# Patient Record
Sex: Female | Born: 1945 | Race: White | Hispanic: No | Marital: Married | Smoking: Never smoker
Health system: Southern US, Community
[De-identification: ages and names within clinical notes are randomized; demographics above are authoritative.]

## PROBLEM LIST (undated history)

## (undated) DIAGNOSIS — K219 Gastro-esophageal reflux disease without esophagitis: Secondary | ICD-10-CM

## (undated) DIAGNOSIS — D649 Anemia, unspecified: Secondary | ICD-10-CM

## (undated) DIAGNOSIS — R1310 Dysphagia, unspecified: Secondary | ICD-10-CM

## (undated) DIAGNOSIS — I10 Essential (primary) hypertension: Secondary | ICD-10-CM

## (undated) DIAGNOSIS — G8929 Other chronic pain: Secondary | ICD-10-CM

## (undated) DIAGNOSIS — I890 Lymphedema, not elsewhere classified: Secondary | ICD-10-CM

## (undated) DIAGNOSIS — E2839 Other primary ovarian failure: Secondary | ICD-10-CM

## (undated) DIAGNOSIS — N1832 Chronic kidney disease, stage 3b: Secondary | ICD-10-CM

## (undated) DIAGNOSIS — E039 Hypothyroidism, unspecified: Secondary | ICD-10-CM

## (undated) DIAGNOSIS — D509 Iron deficiency anemia, unspecified: Secondary | ICD-10-CM

## (undated) DIAGNOSIS — M797 Fibromyalgia: Secondary | ICD-10-CM

## (undated) DIAGNOSIS — M2550 Pain in unspecified joint: Secondary | ICD-10-CM

## (undated) DIAGNOSIS — M1990 Unspecified osteoarthritis, unspecified site: Secondary | ICD-10-CM

## (undated) DIAGNOSIS — G2581 Restless legs syndrome: Secondary | ICD-10-CM

## (undated) DIAGNOSIS — R1084 Generalized abdominal pain: Secondary | ICD-10-CM

## (undated) DIAGNOSIS — S02402A Zygomatic fracture, unspecified, initial encounter for closed fracture: Secondary | ICD-10-CM

## (undated) DIAGNOSIS — R1313 Dysphagia, pharyngeal phase: Secondary | ICD-10-CM

## (undated) DIAGNOSIS — L719 Rosacea, unspecified: Secondary | ICD-10-CM

## (undated) DIAGNOSIS — Z8719 Personal history of other diseases of the digestive system: Secondary | ICD-10-CM

## (undated) DIAGNOSIS — E538 Deficiency of other specified B group vitamins: Secondary | ICD-10-CM

## (undated) DIAGNOSIS — R413 Other amnesia: Secondary | ICD-10-CM

## (undated) DIAGNOSIS — R5382 Chronic fatigue, unspecified: Secondary | ICD-10-CM

## (undated) DIAGNOSIS — R296 Repeated falls: Secondary | ICD-10-CM

## (undated) DIAGNOSIS — F419 Anxiety disorder, unspecified: Secondary | ICD-10-CM

## (undated) DIAGNOSIS — Z9889 Other specified postprocedural states: Secondary | ICD-10-CM

## (undated) DIAGNOSIS — F329 Major depressive disorder, single episode, unspecified: Secondary | ICD-10-CM

## (undated) DIAGNOSIS — G5710 Meralgia paresthetica, unspecified lower limb: Secondary | ICD-10-CM

## (undated) DIAGNOSIS — E7800 Pure hypercholesterolemia, unspecified: Secondary | ICD-10-CM

## (undated) DIAGNOSIS — R269 Unspecified abnormalities of gait and mobility: Secondary | ICD-10-CM

## (undated) DIAGNOSIS — G9332 Myalgic encephalomyelitis/chronic fatigue syndrome: Secondary | ICD-10-CM

## (undated) DIAGNOSIS — S52501A Unspecified fracture of the lower end of right radius, initial encounter for closed fracture: Secondary | ICD-10-CM

## (undated) DIAGNOSIS — H6060 Unspecified chronic otitis externa, unspecified ear: Secondary | ICD-10-CM

## (undated) DIAGNOSIS — K589 Irritable bowel syndrome without diarrhea: Secondary | ICD-10-CM

## (undated) DIAGNOSIS — J309 Allergic rhinitis, unspecified: Secondary | ICD-10-CM

## (undated) DIAGNOSIS — E0500 Thyrotoxicosis with diffuse goiter without thyrotoxic crisis or storm: Secondary | ICD-10-CM

## (undated) DIAGNOSIS — G43909 Migraine, unspecified, not intractable, without status migrainosus: Secondary | ICD-10-CM

## (undated) DIAGNOSIS — R600 Localized edema: Secondary | ICD-10-CM

## (undated) DIAGNOSIS — I639 Cerebral infarction, unspecified: Secondary | ICD-10-CM

## (undated) DIAGNOSIS — M545 Low back pain: Secondary | ICD-10-CM

## (undated) DIAGNOSIS — J189 Pneumonia, unspecified organism: Secondary | ICD-10-CM

## (undated) DIAGNOSIS — M3500 Sicca syndrome, unspecified: Secondary | ICD-10-CM

## (undated) HISTORY — DX: Unspecified fracture of the lower end of right radius, initial encounter for closed fracture: S52.501A

## (undated) HISTORY — DX: Chronic kidney disease, stage 3b: N18.32

## (undated) HISTORY — DX: Generalized abdominal pain: R10.84

## (undated) HISTORY — DX: Gastro-esophageal reflux disease without esophagitis: K21.9

## (undated) HISTORY — DX: Personal history of other diseases of the digestive system: Z87.19

## (undated) HISTORY — PX: ENDOMETRIAL ABLATION: SHX621

## (undated) HISTORY — PX: JOINT REPLACEMENT: SHX530

## (undated) HISTORY — DX: Dysphagia, pharyngeal phase: R13.13

## (undated) HISTORY — DX: Anemia, unspecified: D64.9

## (undated) HISTORY — DX: Unspecified osteoarthritis, unspecified site: M19.90

## (undated) HISTORY — PX: LAMINECTOMY: SHX219

## (undated) HISTORY — PX: AUGMENTATION MAMMAPLASTY: SUR837

## (undated) HISTORY — DX: Other primary ovarian failure: E28.39

## (undated) HISTORY — DX: Irritable bowel syndrome, unspecified: K58.9

## (undated) HISTORY — DX: Deficiency of other specified B group vitamins: E53.8

## (undated) HISTORY — DX: Allergic rhinitis, unspecified: J30.9

## (undated) HISTORY — PX: THYROIDECTOMY: SHX17

## (undated) HISTORY — DX: Lymphedema, not elsewhere classified: I89.0

## (undated) HISTORY — DX: Iron deficiency anemia, unspecified: D50.9

## (undated) HISTORY — DX: Rosacea, unspecified: L71.9

## (undated) HISTORY — DX: Essential (primary) hypertension: I10

## (undated) HISTORY — DX: Chronic fatigue, unspecified: R53.82

## (undated) HISTORY — DX: Thyrotoxicosis with diffuse goiter without thyrotoxic crisis or storm: E05.00

## (undated) HISTORY — DX: Migraine, unspecified, not intractable, without status migrainosus: G43.909

## (undated) HISTORY — DX: Pure hypercholesterolemia, unspecified: E78.00

## (undated) HISTORY — DX: Hypothyroidism, unspecified: E03.9

## (undated) HISTORY — DX: Major depressive disorder, single episode, unspecified: F32.9

## (undated) HISTORY — PX: ABDOMINAL HYSTERECTOMY: SHX81

## (undated) HISTORY — DX: Myalgic encephalomyelitis/chronic fatigue syndrome: G93.32

## (undated) HISTORY — DX: Unspecified abnormalities of gait and mobility: R26.9

## (undated) HISTORY — DX: Pain in unspecified joint: M25.50

## (undated) HISTORY — PX: GASTRIC BYPASS: SHX52

## (undated) HISTORY — PX: APPENDECTOMY: SHX54

## (undated) HISTORY — DX: Other specified postprocedural states: Z98.890

## (undated) HISTORY — DX: Unspecified chronic otitis externa, unspecified ear: H60.60

## (undated) HISTORY — DX: Meralgia paresthetica, unspecified lower limb: G57.10

## (undated) HISTORY — DX: Zygomatic fracture, unspecified side, initial encounter for closed fracture: S02.402A

## (undated) HISTORY — DX: Localized edema: R60.0

## (undated) HISTORY — PX: KNEE ARTHROPLASTY: SHX992

## (undated) HISTORY — DX: Other chronic pain: G89.29

## (undated) HISTORY — DX: Restless legs syndrome: G25.81

## (undated) HISTORY — DX: Fibromyalgia: M79.7

## (undated) HISTORY — DX: Cerebral infarction, unspecified: I63.9

## (undated) HISTORY — DX: Sicca syndrome, unspecified: M35.00

## (undated) HISTORY — DX: Anxiety disorder, unspecified: F41.9

## (undated) HISTORY — DX: Low back pain: M54.5

---
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
## 1998-03-20 ENCOUNTER — Other Ambulatory Visit: Admission: RE | Admit: 1998-03-20 | Discharge: 1998-03-20 | Payer: Self-pay | Admitting: Gynecology

## 1998-07-24 ENCOUNTER — Encounter: Admission: RE | Admit: 1998-07-24 | Discharge: 1998-09-03 | Payer: Self-pay | Admitting: Psychiatry

## 1999-07-02 ENCOUNTER — Other Ambulatory Visit: Admission: RE | Admit: 1999-07-02 | Discharge: 1999-07-02 | Payer: Self-pay | Admitting: Gynecology

## 2000-05-04 ENCOUNTER — Encounter: Admission: RE | Admit: 2000-05-04 | Discharge: 2000-08-02 | Payer: Self-pay | Admitting: Surgical Oncology

## 2000-08-31 ENCOUNTER — Other Ambulatory Visit: Admission: RE | Admit: 2000-08-31 | Discharge: 2000-08-31 | Payer: Self-pay | Admitting: Gynecology

## 2000-10-29 ENCOUNTER — Ambulatory Visit (HOSPITAL_COMMUNITY): Admission: RE | Admit: 2000-10-29 | Discharge: 2000-10-29 | Payer: Self-pay | Admitting: Sports Medicine

## 2000-10-29 ENCOUNTER — Encounter: Payer: Self-pay | Admitting: Sports Medicine

## 2001-02-17 ENCOUNTER — Ambulatory Visit (HOSPITAL_COMMUNITY): Admission: RE | Admit: 2001-02-17 | Discharge: 2001-02-17 | Payer: Self-pay | Admitting: Gastroenterology

## 2001-05-01 ENCOUNTER — Ambulatory Visit (HOSPITAL_COMMUNITY): Admission: RE | Admit: 2001-05-01 | Discharge: 2001-05-01 | Payer: Self-pay | Admitting: Family Medicine

## 2001-05-01 ENCOUNTER — Encounter: Payer: Self-pay | Admitting: Family Medicine

## 2001-05-09 ENCOUNTER — Encounter: Payer: Self-pay | Admitting: Neurosurgery

## 2001-05-09 ENCOUNTER — Ambulatory Visit (HOSPITAL_COMMUNITY): Admission: RE | Admit: 2001-05-09 | Discharge: 2001-05-09 | Payer: Self-pay | Admitting: Neurosurgery

## 2001-06-01 ENCOUNTER — Encounter: Payer: Self-pay | Admitting: Neurosurgery

## 2001-06-01 ENCOUNTER — Inpatient Hospital Stay (HOSPITAL_COMMUNITY): Admission: RE | Admit: 2001-06-01 | Discharge: 2001-06-03 | Payer: Self-pay | Admitting: Neurosurgery

## 2001-09-06 ENCOUNTER — Other Ambulatory Visit: Admission: RE | Admit: 2001-09-06 | Discharge: 2001-09-06 | Payer: Self-pay | Admitting: Gynecology

## 2001-11-08 ENCOUNTER — Ambulatory Visit (HOSPITAL_COMMUNITY): Admission: RE | Admit: 2001-11-08 | Discharge: 2001-11-08 | Payer: Self-pay | Admitting: Neurosurgery

## 2001-11-08 ENCOUNTER — Encounter: Payer: Self-pay | Admitting: Neurosurgery

## 2002-03-14 ENCOUNTER — Encounter: Payer: Self-pay | Admitting: Neurology

## 2002-03-14 ENCOUNTER — Ambulatory Visit (HOSPITAL_COMMUNITY): Admission: RE | Admit: 2002-03-14 | Discharge: 2002-03-14 | Payer: Self-pay | Admitting: Neurology

## 2002-09-26 ENCOUNTER — Other Ambulatory Visit: Admission: RE | Admit: 2002-09-26 | Discharge: 2002-09-26 | Payer: Self-pay | Admitting: Gynecology

## 2002-11-30 ENCOUNTER — Encounter: Payer: Self-pay | Admitting: Gastroenterology

## 2002-11-30 ENCOUNTER — Ambulatory Visit (HOSPITAL_COMMUNITY): Admission: RE | Admit: 2002-11-30 | Discharge: 2002-11-30 | Payer: Self-pay | Admitting: Gastroenterology

## 2003-11-27 ENCOUNTER — Other Ambulatory Visit: Admission: RE | Admit: 2003-11-27 | Discharge: 2003-11-27 | Payer: Self-pay | Admitting: Gynecology

## 2005-01-20 ENCOUNTER — Encounter: Admission: RE | Admit: 2005-01-20 | Discharge: 2005-01-20 | Payer: Self-pay | Admitting: Rheumatology

## 2005-01-23 ENCOUNTER — Encounter: Admission: RE | Admit: 2005-01-23 | Discharge: 2005-01-23 | Payer: Self-pay | Admitting: Rheumatology

## 2005-02-02 ENCOUNTER — Other Ambulatory Visit: Admission: RE | Admit: 2005-02-02 | Discharge: 2005-02-02 | Payer: Self-pay | Admitting: Gynecology

## 2005-02-17 ENCOUNTER — Ambulatory Visit: Payer: Self-pay | Admitting: Gastroenterology

## 2005-02-18 ENCOUNTER — Ambulatory Visit: Payer: Self-pay | Admitting: Gastroenterology

## 2005-02-27 ENCOUNTER — Ambulatory Visit: Payer: Self-pay | Admitting: Gastroenterology

## 2005-03-09 ENCOUNTER — Ambulatory Visit: Payer: Self-pay | Admitting: Gastroenterology

## 2006-11-16 ENCOUNTER — Other Ambulatory Visit: Admission: RE | Admit: 2006-11-16 | Discharge: 2006-11-16 | Payer: Self-pay | Admitting: Gynecology

## 2006-11-25 ENCOUNTER — Encounter: Admission: RE | Admit: 2006-11-25 | Discharge: 2006-11-25 | Payer: Self-pay | Admitting: Gynecology

## 2007-01-04 ENCOUNTER — Emergency Department (HOSPITAL_COMMUNITY): Admission: EM | Admit: 2007-01-04 | Discharge: 2007-01-04 | Payer: Self-pay | Admitting: Emergency Medicine

## 2007-03-14 ENCOUNTER — Encounter: Admission: RE | Admit: 2007-03-14 | Discharge: 2007-04-21 | Payer: Self-pay | Admitting: Neurology

## 2007-03-14 ENCOUNTER — Ambulatory Visit: Payer: Self-pay | Admitting: Psychology

## 2007-04-21 ENCOUNTER — Ambulatory Visit: Payer: Self-pay | Admitting: Psychology

## 2007-11-22 ENCOUNTER — Other Ambulatory Visit: Admission: RE | Admit: 2007-11-22 | Discharge: 2007-11-22 | Payer: Self-pay | Admitting: Gynecology

## 2008-01-26 ENCOUNTER — Ambulatory Visit (HOSPITAL_COMMUNITY): Admission: RE | Admit: 2008-01-26 | Discharge: 2008-01-26 | Payer: Self-pay | Admitting: Anesthesiology

## 2009-07-24 ENCOUNTER — Encounter: Admission: RE | Admit: 2009-07-24 | Discharge: 2009-07-24 | Payer: Self-pay | Admitting: Anesthesiology

## 2010-09-14 ENCOUNTER — Encounter: Payer: Self-pay | Admitting: Gynecology

## 2010-09-14 ENCOUNTER — Encounter: Payer: Self-pay | Admitting: Anesthesiology

## 2010-12-29 ENCOUNTER — Other Ambulatory Visit (HOSPITAL_COMMUNITY): Payer: Self-pay | Admitting: Orthopedic Surgery

## 2010-12-29 ENCOUNTER — Encounter (HOSPITAL_COMMUNITY)
Admission: RE | Admit: 2010-12-29 | Discharge: 2010-12-29 | Disposition: A | Discharge status: Home / Self Care | Payer: Medicare Other | Source: Ambulatory Visit | Attending: Orthopedic Surgery | Admitting: Orthopedic Surgery

## 2010-12-29 DIAGNOSIS — Z01811 Encounter for preprocedural respiratory examination: Secondary | ICD-10-CM

## 2010-12-29 DIAGNOSIS — Z01810 Encounter for preprocedural cardiovascular examination: Secondary | ICD-10-CM | POA: Insufficient documentation

## 2010-12-29 DIAGNOSIS — Z01818 Encounter for other preprocedural examination: Secondary | ICD-10-CM | POA: Insufficient documentation

## 2010-12-29 DIAGNOSIS — Z01812 Encounter for preprocedural laboratory examination: Secondary | ICD-10-CM | POA: Insufficient documentation

## 2010-12-29 LAB — DIFFERENTIAL
Basophils Absolute: 0.1 10*3/uL (ref 0.0–0.1)
Basophils Relative: 2 % — ABNORMAL HIGH (ref 0–1)
Eosinophils Absolute: 0.3 10*3/uL (ref 0.0–0.7)
Eosinophils Relative: 5 % (ref 0–5)
Lymphocytes Relative: 36 % (ref 12–46)
Lymphs Abs: 1.8 10*3/uL (ref 0.7–4.0)
Monocytes Absolute: 0.4 10*3/uL (ref 0.1–1.0)
Monocytes Relative: 7 % (ref 3–12)
Neutro Abs: 2.6 10*3/uL (ref 1.7–7.7)
Neutrophils Relative %: 50 % (ref 43–77)

## 2010-12-29 LAB — COMPREHENSIVE METABOLIC PANEL
ALT: 16 U/L (ref 0–35)
AST: 19 U/L (ref 0–37)
Albumin: 4 g/dL (ref 3.5–5.2)
Alkaline Phosphatase: 118 U/L — ABNORMAL HIGH (ref 39–117)
BUN: 12 mg/dL (ref 6–23)
CO2: 34 mEq/L — ABNORMAL HIGH (ref 19–32)
Calcium: 9.9 mg/dL (ref 8.4–10.5)
Chloride: 98 mEq/L (ref 96–112)
Creatinine, Ser: 1.06 mg/dL (ref 0.4–1.2)
GFR calc Af Amer: >60 mL/min (ref >60)
GFR calc non Af Amer: 52 mL/min — ABNORMAL LOW (ref >60)
Glucose, Bld: 91 mg/dL (ref 70–99)
Potassium: 4.5 mEq/L (ref 3.5–5.1)
Sodium: 138 mEq/L (ref 135–145)
Total Bilirubin: 0.2 mg/dL — ABNORMAL LOW (ref 0.3–1.2)
Total Protein: 6.3 g/dL (ref 6.0–8.3)

## 2010-12-29 LAB — CBC
HCT: 39.6 % (ref 36.0–46.0)
Hemoglobin: 13.1 g/dL (ref 12.0–15.0)
MCH: 29.8 pg (ref 26.0–34.0)
MCHC: 33.1 g/dL (ref 30.0–36.0)
MCV: 90 fL (ref 78.0–100.0)
Platelets: 184 10*3/uL (ref 150–400)
RBC: 4.4 MIL/uL (ref 3.87–5.11)
RDW: 12.5 % (ref 11.5–15.5)
WBC: 5.1 10*3/uL (ref 4.0–10.5)

## 2010-12-29 LAB — SURGICAL PCR SCREEN
MRSA, PCR: NEGATIVE
Staphylococcus aureus: NEGATIVE

## 2010-12-29 LAB — URINALYSIS, ROUTINE W REFLEX MICROSCOPIC
Bilirubin Urine: NEGATIVE
Glucose, UA: NEGATIVE mg/dL
Hgb urine dipstick: NEGATIVE
Ketones, ur: 15 mg/dL — AB
Nitrite: NEGATIVE
Protein, ur: NEGATIVE mg/dL
Specific Gravity, Urine: 1.02 (ref 1.005–1.030)
Urobilinogen, UA: 0.2 mg/dL (ref 0.0–1.0)
pH: 6 (ref 5.0–8.0)

## 2010-12-29 LAB — APTT: aPTT: 26 seconds (ref 24–37)

## 2010-12-29 LAB — ABO/RH: ABO/RH(D): A POS

## 2010-12-29 LAB — URINE MICROSCOPIC-ADD ON

## 2010-12-29 LAB — TYPE AND SCREEN
ABO/RH(D): A POS
Antibody Screen: NEGATIVE

## 2010-12-29 LAB — PROTIME-INR
INR: 0.9 (ref 0.00–1.49)
Prothrombin Time: 12.4 seconds (ref 11.6–15.2)

## 2010-12-30 LAB — URINE CULTURE
Colony Count: NO GROWTH
Culture  Setup Time: 201205071617
Culture: NO GROWTH

## 2011-01-09 NOTE — Op Note (Signed)
. [facility]  Patient:    Jillian Morris, Jillian Morris Visit Number: [id number] MRN: [id number]          Service Type: SUR Location: 3000 [id number] [id number] Attending Physician:  Barton Fanny Dictated by:   Hewitt Shorts, M.D. Proc. Date: 06/01/01 Admit Date:  06/01/2001                             Operative Report  PREOPERATIVE DIAGNOSIS:  Lumbar stenosis.  POSTOPERATIVE DIAGNOSIS:  Lumbar stenosis.  PROCEDURE:  L3 to L5 lumbar laminectomy.  SURGEON:  Hewitt Shorts, M.D.  ANESTHESIA:  General endotracheal.  INDICATION:  The patient is a 65 year old woman who presented with back and right lower extremity pain, who was found to have advanced degenerative joint disease and spondylosis with resulting multifactorial lumbar stenosis, worst at L4-5 but also significant at L3-4.  A decision was made to proceed with an L3 to L5 lumbar laminectomy for decompression of stenosis.  DESCRIPTION OF PROCEDURE:  Patient brought to the operating room and placed under general endotracheal anesthesia.  The patient was turned to a prone position.  the lumbar region was prepped with Betadine soap and solution and draped in a sterile fashion.  The midline was infiltrated with local anesthetic with epinephrine, an x-ray was taken to localize the L3-4 and L4-5 levels and a midline incision incision made and carried down through the subcutaneous tissue.  Bipolar cautery and electrocautery used to maintain hemostasis.  Dissection was carried down to the lumbar fascia, which was divided bilaterally, and the paraspinous muscles were dissected from the spinous processes and laminae in subperiosteal fashion.  Another x-ray was taken.  The L3, L4, and L5 spinous processes and laminae were identified.  WE then proceeded with a multilevel lumbar laminectomy utilizing double-action rongeurs, the [facility] Max drill, and Kerrison punches.  Care was taken to leave the underlying  thecal sac undisturbed.  There was found to be marked ligament overgrowth at L3-4, L4-5, and L5-S1.  This was carefully removed.  The foramina were carefully examined, and the foramina for the L3, L4, L5, and S1 nerve roots were decompressed bilaterally.  Once the decompression was complete and hemostasis was established, edges of the bone were waxed as necessary and a Gelfoam soaked in thrombin was placed in the laminectomy defect.  The paraspinal muscles were approximated with interrupted, undyed 1 Vicryl sutures, the deep fascia closed with interrupted, undyed 1 Vicryl sutures, and subcutaneous and subcuticular closure with interrupted, inverted 2-0 undyed Vicryl sutures and the skin edges were approximated with Dermabond. The patient tolerated the procedure well.  The estimated blood loss was 100 cc.  Sponge and needle count were correct.  Following surgery, the patient was turned back to a supine position, to be reversed from the anesthetic, extubated, and transferred to the recovery room for further care. Dictated by:   Hewitt Shorts, M.D. Attending Physician:  Barton Fanny DD:  06/01/01 TD:  06/01/01 Job: [id number]

## 2011-01-23 HISTORY — PX: REPLACEMENT TOTAL KNEE: SUR1224

## 2011-02-04 ENCOUNTER — Encounter (HOSPITAL_COMMUNITY)
Admission: RE | Admit: 2011-02-04 | Discharge: 2011-02-04 | Disposition: A | Discharge status: Home / Self Care | Payer: Medicare Other | Source: Ambulatory Visit | Attending: Orthopedic Surgery | Admitting: Orthopedic Surgery

## 2011-02-04 LAB — DIFFERENTIAL
Basophils Absolute: 0 10*3/uL (ref 0.0–0.1)
Basophils Relative: 1 % (ref 0–1)
Eosinophils Absolute: 0.3 10*3/uL (ref 0.0–0.7)
Eosinophils Relative: 6 % — ABNORMAL HIGH (ref 0–5)
Lymphocytes Relative: 39 % (ref 12–46)
Lymphs Abs: 1.9 10*3/uL (ref 0.7–4.0)
Monocytes Absolute: 0.4 10*3/uL (ref 0.1–1.0)
Monocytes Relative: 8 % (ref 3–12)
Neutro Abs: 2.3 10*3/uL (ref 1.7–7.7)
Neutrophils Relative %: 47 % (ref 43–77)

## 2011-02-04 LAB — CBC
HCT: 36.4 % (ref 36.0–46.0)
Hemoglobin: 12.1 g/dL (ref 12.0–15.0)
MCH: 30.2 pg (ref 26.0–34.0)
MCHC: 33.2 g/dL (ref 30.0–36.0)
MCV: 90.8 fL (ref 78.0–100.0)
Platelets: 153 10*3/uL (ref 150–400)
RBC: 4.01 MIL/uL (ref 3.87–5.11)
RDW: 12.6 % (ref 11.5–15.5)
WBC: 4.9 10*3/uL (ref 4.0–10.5)

## 2011-02-04 LAB — URINALYSIS, ROUTINE W REFLEX MICROSCOPIC
Glucose, UA: NEGATIVE mg/dL
Hgb urine dipstick: NEGATIVE
Ketones, ur: NEGATIVE mg/dL
Leukocytes, UA: NEGATIVE
Nitrite: NEGATIVE
Protein, ur: NEGATIVE mg/dL
Specific Gravity, Urine: 1.023 (ref 1.005–1.030)
Urobilinogen, UA: 0.2 mg/dL (ref 0.0–1.0)
pH: 5.5 (ref 5.0–8.0)

## 2011-02-04 LAB — COMPREHENSIVE METABOLIC PANEL
ALT: 12 U/L (ref 0–35)
AST: 14 U/L (ref 0–37)
Albumin: 3.7 g/dL (ref 3.5–5.2)
Alkaline Phosphatase: 103 U/L (ref 39–117)
BUN: 12 mg/dL (ref 6–23)
CO2: 31 mEq/L (ref 19–32)
Calcium: 9.1 mg/dL (ref 8.4–10.5)
Chloride: 104 mEq/L (ref 96–112)
Creatinine, Ser: 1.01 mg/dL (ref 0.4–1.2)
GFR calc Af Amer: >60 mL/min (ref >60)
GFR calc non Af Amer: 55 mL/min — ABNORMAL LOW (ref >60)
Glucose, Bld: 82 mg/dL (ref 70–99)
Potassium: 4.1 mEq/L (ref 3.5–5.1)
Sodium: 142 mEq/L (ref 135–145)
Total Bilirubin: 0.3 mg/dL (ref 0.3–1.2)
Total Protein: 5.9 g/dL — ABNORMAL LOW (ref 6.0–8.3)

## 2011-02-04 LAB — PROTIME-INR
INR: 0.89 (ref 0.00–1.49)
Prothrombin Time: 12.2 seconds (ref 11.6–15.2)

## 2011-02-04 LAB — SURGICAL PCR SCREEN
MRSA, PCR: NEGATIVE
Staphylococcus aureus: NEGATIVE

## 2011-02-04 LAB — APTT: aPTT: 23 seconds — ABNORMAL LOW (ref 24–37)

## 2011-02-05 LAB — URINE CULTURE
Colony Count: NO GROWTH
Culture  Setup Time: 201206131231
Culture: NO GROWTH

## 2011-02-16 ENCOUNTER — Inpatient Hospital Stay (HOSPITAL_COMMUNITY)
Admission: RE | Admit: 2011-02-16 | Discharge: 2011-02-23 | DRG: 470 | Disposition: A | Discharge status: Skilled Nursing Facility | Payer: Medicare Other | Source: Ambulatory Visit | Attending: Orthopedic Surgery | Admitting: Orthopedic Surgery

## 2011-02-16 ENCOUNTER — Inpatient Hospital Stay (HOSPITAL_COMMUNITY): Payer: Medicare Other

## 2011-02-16 DIAGNOSIS — I890 Lymphedema, not elsewhere classified: Secondary | ICD-10-CM | POA: Diagnosis present

## 2011-02-16 DIAGNOSIS — L719 Rosacea, unspecified: Secondary | ICD-10-CM | POA: Diagnosis present

## 2011-02-16 DIAGNOSIS — F3289 Other specified depressive episodes: Secondary | ICD-10-CM | POA: Diagnosis present

## 2011-02-16 DIAGNOSIS — M1710 Unilateral primary osteoarthritis, unspecified knee: Principal | ICD-10-CM | POA: Diagnosis present

## 2011-02-16 DIAGNOSIS — R5382 Chronic fatigue, unspecified: Secondary | ICD-10-CM | POA: Diagnosis present

## 2011-02-16 DIAGNOSIS — I10 Essential (primary) hypertension: Secondary | ICD-10-CM | POA: Diagnosis present

## 2011-02-16 DIAGNOSIS — M4800 Spinal stenosis, site unspecified: Secondary | ICD-10-CM | POA: Diagnosis present

## 2011-02-16 DIAGNOSIS — E039 Hypothyroidism, unspecified: Secondary | ICD-10-CM | POA: Diagnosis present

## 2011-02-16 DIAGNOSIS — F329 Major depressive disorder, single episode, unspecified: Secondary | ICD-10-CM | POA: Diagnosis present

## 2011-02-16 DIAGNOSIS — K589 Irritable bowel syndrome without diarrhea: Secondary | ICD-10-CM | POA: Diagnosis present

## 2011-02-16 DIAGNOSIS — Z9109 Other allergy status, other than to drugs and biological substances: Secondary | ICD-10-CM

## 2011-02-16 DIAGNOSIS — G9332 Myalgic encephalomyelitis/chronic fatigue syndrome: Secondary | ICD-10-CM | POA: Diagnosis present

## 2011-02-16 DIAGNOSIS — D62 Acute posthemorrhagic anemia: Secondary | ICD-10-CM | POA: Diagnosis not present

## 2011-02-16 DIAGNOSIS — E876 Hypokalemia: Secondary | ICD-10-CM | POA: Diagnosis not present

## 2011-02-16 DIAGNOSIS — Z9884 Bariatric surgery status: Secondary | ICD-10-CM

## 2011-02-17 LAB — BASIC METABOLIC PANEL
BUN: 7 mg/dL (ref 6–23)
CO2: 28 mEq/L (ref 19–32)
Calcium: 7.7 mg/dL — ABNORMAL LOW (ref 8.4–10.5)
Chloride: 107 mEq/L (ref 96–112)
Creatinine, Ser: 0.78 mg/dL (ref 0.50–1.10)
GFR calc Af Amer: >60 mL/min (ref >60)
GFR calc non Af Amer: >60 mL/min (ref >60)
Glucose, Bld: 103 mg/dL — ABNORMAL HIGH (ref 70–99)
Potassium: 3.5 mEq/L (ref 3.5–5.1)
Sodium: 139 mEq/L (ref 135–145)

## 2011-02-17 LAB — CBC
HCT: 23.1 % — ABNORMAL LOW (ref 36.0–46.0)
Hemoglobin: 7.9 g/dL — ABNORMAL LOW (ref 12.0–15.0)
MCH: 30.2 pg (ref 26.0–34.0)
MCHC: 34.2 g/dL (ref 30.0–36.0)
MCV: 88.2 fL (ref 78.0–100.0)
Platelets: 129 10*3/uL — ABNORMAL LOW (ref 150–400)
RBC: 2.62 MIL/uL — ABNORMAL LOW (ref 3.87–5.11)
RDW: 12.7 % (ref 11.5–15.5)
WBC: 5.5 10*3/uL (ref 4.0–10.5)

## 2011-02-17 LAB — PREPARE RBC (CROSSMATCH)

## 2011-02-18 ENCOUNTER — Inpatient Hospital Stay (HOSPITAL_COMMUNITY): Payer: Medicare Other

## 2011-02-18 LAB — BASIC METABOLIC PANEL
BUN: 9 mg/dL (ref 6–23)
CO2: 27 mEq/L (ref 19–32)
Calcium: 8 mg/dL — ABNORMAL LOW (ref 8.4–10.5)
Chloride: 107 mEq/L (ref 96–112)
Creatinine, Ser: 0.76 mg/dL (ref 0.50–1.10)
GFR calc Af Amer: >60 mL/min (ref >60)
GFR calc non Af Amer: >60 mL/min (ref >60)
Glucose, Bld: 107 mg/dL — ABNORMAL HIGH (ref 70–99)
Potassium: 3.5 mEq/L (ref 3.5–5.1)
Sodium: 140 mEq/L (ref 135–145)

## 2011-02-18 LAB — CBC
HCT: 28.4 % — ABNORMAL LOW (ref 36.0–46.0)
Hemoglobin: 9.9 g/dL — ABNORMAL LOW (ref 12.0–15.0)
MCH: 30.4 pg (ref 26.0–34.0)
MCHC: 34.9 g/dL (ref 30.0–36.0)
MCV: 87.1 fL (ref 78.0–100.0)
Platelets: 120 10*3/uL — ABNORMAL LOW (ref 150–400)
RBC: 3.26 MIL/uL — ABNORMAL LOW (ref 3.87–5.11)
RDW: 13.2 % (ref 11.5–15.5)
WBC: 7.2 10*3/uL (ref 4.0–10.5)

## 2011-02-18 LAB — TYPE AND SCREEN
ABO/RH(D): A POS
Antibody Screen: NEGATIVE
Unit division: 0
Unit division: 0

## 2011-02-19 LAB — BASIC METABOLIC PANEL
BUN: 8 mg/dL (ref 6–23)
CO2: 24 mEq/L (ref 19–32)
Calcium: 8.3 mg/dL — ABNORMAL LOW (ref 8.4–10.5)
Chloride: 102 mEq/L (ref 96–112)
Creatinine, Ser: 0.8 mg/dL (ref 0.50–1.10)
GFR calc Af Amer: >60 mL/min (ref >60)
GFR calc non Af Amer: >60 mL/min (ref >60)
Glucose, Bld: 94 mg/dL (ref 70–99)
Potassium: 3.2 mEq/L — ABNORMAL LOW (ref 3.5–5.1)
Sodium: 137 mEq/L (ref 135–145)

## 2011-02-19 LAB — CBC
HCT: 30.1 % — ABNORMAL LOW (ref 36.0–46.0)
Hemoglobin: 10.3 g/dL — ABNORMAL LOW (ref 12.0–15.0)
MCH: 30 pg (ref 26.0–34.0)
MCHC: 34.2 g/dL (ref 30.0–36.0)
MCV: 87.8 fL (ref 78.0–100.0)
Platelets: 133 10*3/uL — ABNORMAL LOW (ref 150–400)
RBC: 3.43 MIL/uL — ABNORMAL LOW (ref 3.87–5.11)
RDW: 13.4 % (ref 11.5–15.5)
WBC: 6.9 10*3/uL (ref 4.0–10.5)

## 2011-02-20 DIAGNOSIS — M79609 Pain in unspecified limb: Secondary | ICD-10-CM

## 2011-02-22 LAB — BASIC METABOLIC PANEL
BUN: 9 mg/dL (ref 6–23)
CO2: 27 mEq/L (ref 19–32)
Calcium: 8.7 mg/dL (ref 8.4–10.5)
Chloride: 103 mEq/L (ref 96–112)
Creatinine, Ser: 0.9 mg/dL (ref 0.50–1.10)
GFR calc Af Amer: >60 mL/min (ref >60)
GFR calc non Af Amer: >60 mL/min (ref >60)
Glucose, Bld: 79 mg/dL (ref 70–99)
Potassium: 3.3 mEq/L — ABNORMAL LOW (ref 3.5–5.1)
Sodium: 140 mEq/L (ref 135–145)

## 2011-03-17 NOTE — Op Note (Signed)
NAME:  Jillian Morris, Jillian Morris NO.:  [id number]  MEDICAL RECORD NO.:  [id number]  LOCATION:  5039                         FACILITY:  [facility]  PHYSICIAN:  Dyke Brackett, M.D.    DATE OF BIRTH:  27-Jun-1946  DATE OF PROCEDURE:  02/16/2011 DATE OF DISCHARGE:                              OPERATIVE REPORT   INDICATIONS:  This is a 65 year old female with NICKLE allergy with end- stage knee arthritis bilaterally, right worse than left knee pain, thought to be amenable to hospitalization with insertion of cemented titanium knee.  PREOPERATIVE DIAGNOSIS:  Osteoarthritis of right knee.  POSTOPERATIVE DIAGNOSIS:  Osteoarthritis of right knee.  OPERATION:  Right total knee replacement (Zimmer NexGen titanium knee, femur size E, tibia size 4, 15 x 30-mm stem extension, 32 patella, 10-mm articular surface.  SURGEON:  Dyke Brackett, MD  ASSISTANTSu Hilt, PA  TOURNIQUET TIME:  1 hour 48 minutes.  ANESTHETIC:  General with femoral nerve block.  PROCEDURE:  Sterile prep and drape, exsanguination of leg, inflation to 350.  We did a medial parapatellar approach to the knee.  We then cut the distal femur for a 6 degrees valgus cut.  We then cut the proximal tibia initially 2 degrees below the most diseased medial compartment. We then noted that it seemed like our extension gap, was in a little bit too much valgus and then we did a corrected cut in the tibia to correct this,  and in the end, when we did a trial, we felt that we will slightly tight in both flexion and extension, then we cut additional 2 mm symmetrically, which comprised 3 different tibial cuts to give the right balance in valgus-varus inclination.  We then used a computerized jig to set the rotation of the prosthesis with equal tension on both the medial and lateral components and we then pinned that in place for sizing; and then at that point, once the rotation size was determined, we placed the  anterior-posterior chamfer cut block.  We completed those blocks and trochlear groove cut block as well.  We then cut the box out with a separate jig on the femur.  The bone quality overall of this patient was noted to be poor with a significant amount of osteopenia.  Soft tissues, although  carefully protected, were likewise relatively friable.  Attention was next directed to the femur.  We sized the femur 4 and E.  In view of the poor bone quality and BMI over 30, I elected to use 15 x 30-mm extension on the tibia for better fixation.  We placed the cutting jig to do the keel for that and we placed trial tibia followed by a trial femur.  Patella was cut resecting about 8 mm of native patella, 3 peg around the patella, trial placed.  As mentioned, once we tied the trials, we noted symmetric somewhat increased tension in the flexion-extension gap and we resected an additional 2-mm tibia and then since there was a slight amount of asymmetry or imbalance, the leg would not tolerate an imbalance, we did use an extra capsule stripping posteriorly to obtain full extension.  Again, we felt like we balanced the  knee well in flexion-extension.  No varus-valgus instability.  All trials were removed, bony surfaces were irrigated.  We then inserted final components, tibia followed by femur with exception of the insert for the tibia.  Cement was allowed to harden.  We then checked all parameters again.  We removed the trial tibial bearing and then released the tourniquet to check for cement.  No excess cement was noted.  No excess bleeding was noted after checking, all small bleeders coagulated. Hemovac drains placed exiting superolaterally.  Closure was effected on the capsule, patella tendon, quad mechanism with #1 Ethibond with 2-0 Vicryl, skin clips, Marcaine with epinephrine into the capsule.  She had a femoral nerve block as noted, taken to recovery room in stable condition.     Dyke Brackett, M.D.     WDC/MEDQ  D:  02/16/2011  T:  02/17/2011  Job:  [id number]  Electronically Signed by W. Carlita Whitcomb M.D. on 03/17/2011 08:21:59 AM

## 2011-03-29 NOTE — Discharge Summary (Signed)
NAME:  Jillian Morris, Jillian Morris NO.:  [id number]  MEDICAL RECORD NO.:  [id number]  LOCATION:  5039                         FACILITY:  [facility]  PHYSICIAN:  Dyke Brackett, M.D.    DATE OF BIRTH:  02-Mar-1946  DATE OF ADMISSION:  02/16/2011 DATE OF DISCHARGE:  02/23/2011                              DISCHARGE SUMMARY   DIAGNOSIS FOR THIS ADMISSION:  End-stage osteoarthritis, right knee.  SECONDARY DIAGNOSES: 1. Severe nickel allergy. 2. Spinal stenosis. 3. Lymphedema. 4. Hypothyroidism. 5. Chronic fatigue syndrome. 6. Depression. 7. Irritable bowel syndrome. 8. Rosacea. 9. History of gastric bypass. 10.Hypertension. 11.Anemia.  PROCEDURE WHILE IN HOSPITAL:  Right total knee arthroplasty.  DISCHARGE SUMMARY:  The patient is a 65 year old female, community ambulator, with end-stage arthritis of both knees, right is worse than left knee pain.  The patient has failed conservative treatment for both knees and that includes injections and arthroscopy.  She now wishes to proceed with the right total knee arthroplasty after discussing risks versus benefits.  PAST MEDICAL HISTORY: 1. Sjogren syndrome. 2. Lymphedema. 3. Thyroid disease. 4. Depression. 5. Graves disease. 6. B12 deficiency. 7. Chronic pain after back surgery. 8. Psoriatic arthritis. 9. Irritable bowel syndrome. 10.Rosacea.  PAST SURGICAL HISTORY:  Positive for gastric bypass, multiple surgeries on both knees, no difficulties with GET.  ALLERGIES:  Severe NICKEL allergy, ANADROL, AMBIEN, LATEX, and PENICILLIN.  FAMILY HISTORY:  Positive for heart attack, coronary artery disease, hypertension, diabetes, and CVA.  SOCIAL HISTORY:  No tobacco, occasional ethanol, no IV drug abuse.  She is a housewife, lives at home.  MEDICATIONS AT TIME OF ADMISSION:  Levothyroxine, lorazepam, triamterene and hydrochlorothiazide, omeprazole, lamotrigine, oxymorphone, Opana, hyoscyamine, cyclobenzaprine,  dicyclomine, trazodone, Cymbalta, and B12 injections.  REVIEW OF SYSTEMS:  Positive for upper and lower dentures, history of recent sinus infection and recent UTI.  PHYSICAL EXAMINATION:  VITAL SIGNS:  Temperature 98.7, pulse 80, respirations 18, blood pressure 123/80.  She is 5 feet and 4 inches, 65- pound female. HEENT:  Head is normocephalic, atraumatic.  Pupils equal, round, reactive to light and accommodation. NECK:  Supple.  Full range of motion. CHEST:  Clear to auscultation. CARDIAC:  Regular rate and rhythm. ABDOMEN:  Soft, nontender. NEUROLOGIC:  Neurovascularly intact.  Cranial nerves II through XII intact. SKIN:  Notable for lymphedema in bilateral lower extremities. MUSCULOSKELETAL:  Right knee range of motion is 5-110 degrees.  Stable ligamentously.  Positive crepitus throughout.  X-rays show bone-on-bone changes of both knees and confirmed by review of her scope films.  Labs including CBC, CMET, chest x-ray, EKG, PT, and PTT were all within normal limits.  The chest x-ray did show what was thought to be possible pulmonary nodule that was suggested to have a followup CT for confirmation.  On the day of admission, the patient was taken to the operating room at [facility] where she underwent a right total knee arthroplasty using a Zimmer NexGen titanium system with size E femur, tibia size 4, a 15 x 13-mm stem extension, 32 patella with a 10- mm bearing surface, all components cemented.  The patient was placed on perioperative antibiotics, was placed on postoperative Lovenox prophylaxis.  She was placed  on physical therapy postoperatively with CPM in the PACU and physical therapy was begun on the first postoperative day.  Foley catheter was placed as was a Hemovac drain.  HOSPITAL COURSE:  Postoperatively, on postoperative day #1, the patient had significant drop in hemoglobin to 7.9 with symptoms of feeling dizzy and weak whenever she tried to arise.  The PCA  Dilaudid gave her little pain relief and she had been switched over to OxyContin 20 mg b.i.d. which gave her better relief.  She was afebrile.  Vital signs were stable.  She had significant amount of drainage but the Hemovac had become quiescent, was discontinued.  She was tolerating CPM 0-60, and she was neurovascularly intact.  She was typed and crossed and transfused with 2 units of packed red cells due to her symptomatic post acute blood loss anemia.  Postop day 2, the patient's T-max was 100.4 with pulse of 101, O2 sat 96 on room air.  Hemoglobin increased to 9.9, WBC was 7.2.  The patient was alert and oriented.  Wound was clean and dry.  Otherwise, stable, improving.  The patient underwent a CT scan to rule out a pulmonary nodule that was suspected on her chest x-ray and it was ruled to have no pulmonary nodule and no significant pathology reported.  Postoperative day 3, the patient's hemoglobin remained stable at 10.3.  She was afebrile.  She had increased swelling and calf tenderness as well as redness and pain in the calf and a Doppler was ordered to rule out DVT.  Wound remained clean and dry.  At that point because of slow progress in physical therapy, the patient was considered and agreed to placement in a short-term skilled facility for rehab and level II forms were sent.  Postoperative day 4, the patient was without complaint other than continued pain, positive bowel movement, Doppler was performed and was negative.  The patient's redness and swelling was slowly improving.  She continued to wait placement and skilled bed. Therapy had been delayed due to greater than 24-hour turnaround on having a Doppler performed, but she continued to make slow progress in therapy and skilled nursing bed was continued for the search. Postoperative days 5 and 6 were unremarkable.  The patient continued to make slow but steady progress.  Postoperative day 6, the patient continued to slowly  improve and was awaiting skilled bed.  The patient was scheduled to be transferred to a skilled bed for rehab on postoperative day 7.  At the time of her discharge, she will be medically stable and orthopedically improved.  MEDICATIONS AT TIME OF DISCHARGE: 1. Tylenol 1-2 pills by mouth every 4 hours as needed for temperature     greater than 100. 2. Colace 100 mg b.i.d. 3. Lovenox 30 mg subcutaneously twice daily for a total of 2 weeks     postoperatively. 4. Maalox 30 mL by mouth every 4 hours as needed. 5. OxyContin 30 mg b.i.d. 6. Oxycodone 5 mg INR one to two by mouth every 3-4 hours as needed. 7. Lorazepam 1 mg by mouth daily at bedtime as needed. 8. Cymbalta 30 mg 3 capsules by mouth daily. 9. Flexeril 10 mg one p.o. q.8 h. p.r.n. spasm. 10.Iron 650 mg two tablets by mouth daily. 11.Lamictal 100 mg 3 tablets by mouth daily. 12.Protonix 40 mg 1 tablet by mouth daily. 13.Reglan 5 mg one p.o. by mouth four times daily. 14.Synthroid 50 mcg 1 tablet by mouth q.a.m. 15.Trazodone 150 mg one by mouth daily at  bedtime. 16.Vitamin B12 intrinsic factor injection intramuscularly monthly. Prescriptions were written for the OxyContin and OxyIR.  The patient's activity level is weightbearing as tolerated with walker. She should continue in the knee immobilizer until she follows up with Dr. Madelon Lips in one week's time.  CPM 0-60 six hours per day.  She may range with physical therapy up to 90 degrees.  She will continue physical therapy daily, dressing changes daily, is needed to keep the wound clean and dry and covered.  She may shower seated with assistance. She should return to see Dr. Madelon Lips in approximately one week's time on Monday, March 02, 2011.  She should return sooner should she have any increase in temperature, fevers of greater than 101, any drainage that looks like pus or any breakdown or dehiscence of the wound, any pain not well controlled by oral pain medication.  Diet is  regular, and the patient should continue the CPM.     Jane B. Jannet Mantis   ______________________________ Dyke Brackett, M.D.    JBR/MEDQ  D:  02/22/2011  T:  02/23/2011  Job:  [id number]  Electronically Signed by Dan Humphreys P.A. on 03/18/2011 07:31:13 PM Electronically Signed by Lacretia Nicks. Anyla Israelson M.D. on 03/29/2011 04:54:09 PM

## 2011-05-21 LAB — CREATININE, SERUM
Creatinine, Ser: 1.18
GFR calc Af Amer: 56 — ABNORMAL LOW
GFR calc non Af Amer: 46 — ABNORMAL LOW

## 2011-05-21 LAB — BUN: BUN: 14

## 2011-10-21 DIAGNOSIS — M961 Postlaminectomy syndrome, not elsewhere classified: Secondary | ICD-10-CM | POA: Diagnosis not present

## 2011-10-21 DIAGNOSIS — M25569 Pain in unspecified knee: Secondary | ICD-10-CM | POA: Diagnosis not present

## 2011-11-02 DIAGNOSIS — F3189 Other bipolar disorder: Secondary | ICD-10-CM | POA: Diagnosis not present

## 2011-11-06 DIAGNOSIS — E538 Deficiency of other specified B group vitamins: Secondary | ICD-10-CM | POA: Diagnosis not present

## 2011-11-06 DIAGNOSIS — E039 Hypothyroidism, unspecified: Secondary | ICD-10-CM | POA: Diagnosis not present

## 2011-11-09 DIAGNOSIS — H04129 Dry eye syndrome of unspecified lacrimal gland: Secondary | ICD-10-CM | POA: Diagnosis not present

## 2011-12-09 DIAGNOSIS — E538 Deficiency of other specified B group vitamins: Secondary | ICD-10-CM | POA: Diagnosis not present

## 2011-12-09 DIAGNOSIS — R1310 Dysphagia, unspecified: Secondary | ICD-10-CM | POA: Diagnosis not present

## 2011-12-09 DIAGNOSIS — M549 Dorsalgia, unspecified: Secondary | ICD-10-CM | POA: Diagnosis not present

## 2011-12-10 ENCOUNTER — Other Ambulatory Visit (HOSPITAL_COMMUNITY): Payer: Self-pay | Admitting: Orthopedic Surgery

## 2011-12-10 ENCOUNTER — Other Ambulatory Visit: Payer: Self-pay | Admitting: Family Medicine

## 2011-12-10 DIAGNOSIS — M25569 Pain in unspecified knee: Secondary | ICD-10-CM | POA: Diagnosis not present

## 2011-12-10 DIAGNOSIS — M1710 Unilateral primary osteoarthritis, unspecified knee: Secondary | ICD-10-CM | POA: Diagnosis not present

## 2011-12-10 DIAGNOSIS — M25561 Pain in right knee: Secondary | ICD-10-CM

## 2011-12-10 DIAGNOSIS — M961 Postlaminectomy syndrome, not elsewhere classified: Secondary | ICD-10-CM | POA: Diagnosis not present

## 2011-12-10 DIAGNOSIS — T847XXA Infection and inflammatory reaction due to other internal orthopedic prosthetic devices, implants and grafts, initial encounter: Secondary | ICD-10-CM | POA: Diagnosis not present

## 2011-12-14 ENCOUNTER — Other Ambulatory Visit: Payer: Medicare Other

## 2011-12-15 ENCOUNTER — Encounter (HOSPITAL_COMMUNITY): Payer: Medicare Other

## 2011-12-15 ENCOUNTER — Other Ambulatory Visit (HOSPITAL_COMMUNITY): Payer: Medicare Other

## 2011-12-21 ENCOUNTER — Ambulatory Visit
Admission: RE | Admit: 2011-12-21 | Discharge: 2011-12-21 | Disposition: A | Discharge status: Home / Self Care | Payer: Medicare Other | Source: Ambulatory Visit | Attending: Family Medicine | Admitting: Family Medicine

## 2011-12-21 DIAGNOSIS — R1310 Dysphagia, unspecified: Secondary | ICD-10-CM | POA: Diagnosis not present

## 2011-12-24 ENCOUNTER — Ambulatory Visit (HOSPITAL_COMMUNITY): Payer: Medicare Other

## 2011-12-24 ENCOUNTER — Encounter (HOSPITAL_COMMUNITY)
Admission: RE | Admit: 2011-12-24 | Discharge: 2011-12-24 | Disposition: A | Discharge status: Home / Self Care | Payer: Medicare Other | Source: Ambulatory Visit | Attending: Orthopedic Surgery | Admitting: Orthopedic Surgery

## 2011-12-24 DIAGNOSIS — M25469 Effusion, unspecified knee: Secondary | ICD-10-CM | POA: Diagnosis not present

## 2011-12-24 DIAGNOSIS — M25561 Pain in right knee: Secondary | ICD-10-CM

## 2011-12-24 DIAGNOSIS — Z96659 Presence of unspecified artificial knee joint: Secondary | ICD-10-CM | POA: Diagnosis not present

## 2011-12-24 DIAGNOSIS — M25569 Pain in unspecified knee: Secondary | ICD-10-CM | POA: Insufficient documentation

## 2011-12-24 DIAGNOSIS — M7989 Other specified soft tissue disorders: Secondary | ICD-10-CM | POA: Diagnosis not present

## 2011-12-24 MED ORDER — TECHNETIUM TC 99M MEDRONATE IV KIT
24.0000 | PACK | Freq: Once | INTRAVENOUS | Status: AC | PRN
  Administered 2011-12-24: 24 via INTRAVENOUS

## 2011-12-28 DIAGNOSIS — M1710 Unilateral primary osteoarthritis, unspecified knee: Secondary | ICD-10-CM | POA: Diagnosis not present

## 2011-12-28 DIAGNOSIS — M25569 Pain in unspecified knee: Secondary | ICD-10-CM | POA: Diagnosis not present

## 2012-02-04 DIAGNOSIS — M79609 Pain in unspecified limb: Secondary | ICD-10-CM | POA: Diagnosis not present

## 2012-02-04 DIAGNOSIS — M961 Postlaminectomy syndrome, not elsewhere classified: Secondary | ICD-10-CM | POA: Diagnosis not present

## 2012-02-04 DIAGNOSIS — IMO0002 Reserved for concepts with insufficient information to code with codable children: Secondary | ICD-10-CM | POA: Diagnosis not present

## 2012-02-05 ENCOUNTER — Other Ambulatory Visit: Payer: Self-pay | Admitting: Physician Assistant

## 2012-02-05 DIAGNOSIS — M545 Low back pain, unspecified: Secondary | ICD-10-CM

## 2012-02-05 DIAGNOSIS — J209 Acute bronchitis, unspecified: Secondary | ICD-10-CM | POA: Diagnosis not present

## 2012-02-08 DIAGNOSIS — E538 Deficiency of other specified B group vitamins: Secondary | ICD-10-CM | POA: Diagnosis not present

## 2012-02-11 ENCOUNTER — Other Ambulatory Visit: Payer: Medicare Other

## 2012-02-17 ENCOUNTER — Encounter: Payer: Self-pay | Admitting: Gastroenterology

## 2012-02-18 ENCOUNTER — Ambulatory Visit
Admission: RE | Admit: 2012-02-18 | Discharge: 2012-02-18 | Disposition: A | Discharge status: Home / Self Care | Payer: Medicare Other | Source: Ambulatory Visit | Attending: Physician Assistant | Admitting: Physician Assistant

## 2012-02-18 DIAGNOSIS — M5137 Other intervertebral disc degeneration, lumbosacral region: Secondary | ICD-10-CM | POA: Diagnosis not present

## 2012-02-18 DIAGNOSIS — M47817 Spondylosis without myelopathy or radiculopathy, lumbosacral region: Secondary | ICD-10-CM | POA: Diagnosis not present

## 2012-02-18 DIAGNOSIS — M545 Low back pain, unspecified: Secondary | ICD-10-CM

## 2012-02-18 DIAGNOSIS — M5126 Other intervertebral disc displacement, lumbar region: Secondary | ICD-10-CM | POA: Diagnosis not present

## 2012-02-23 DIAGNOSIS — M79609 Pain in unspecified limb: Secondary | ICD-10-CM | POA: Diagnosis not present

## 2012-02-23 DIAGNOSIS — M961 Postlaminectomy syndrome, not elsewhere classified: Secondary | ICD-10-CM | POA: Diagnosis not present

## 2012-02-23 DIAGNOSIS — M2550 Pain in unspecified joint: Secondary | ICD-10-CM | POA: Diagnosis not present

## 2012-03-03 DIAGNOSIS — F3189 Other bipolar disorder: Secondary | ICD-10-CM | POA: Diagnosis not present

## 2012-03-04 DIAGNOSIS — J209 Acute bronchitis, unspecified: Secondary | ICD-10-CM | POA: Diagnosis not present

## 2012-03-04 DIAGNOSIS — M26609 Unspecified temporomandibular joint disorder, unspecified side: Secondary | ICD-10-CM | POA: Diagnosis not present

## 2012-03-09 DIAGNOSIS — E538 Deficiency of other specified B group vitamins: Secondary | ICD-10-CM | POA: Diagnosis not present

## 2012-03-09 DIAGNOSIS — E039 Hypothyroidism, unspecified: Secondary | ICD-10-CM | POA: Diagnosis not present

## 2012-03-09 DIAGNOSIS — I10 Essential (primary) hypertension: Secondary | ICD-10-CM | POA: Diagnosis not present

## 2012-03-18 ENCOUNTER — Encounter: Payer: Self-pay | Admitting: *Deleted

## 2012-03-21 ENCOUNTER — Encounter: Payer: Self-pay | Admitting: Gastroenterology

## 2012-03-21 ENCOUNTER — Ambulatory Visit (INDEPENDENT_AMBULATORY_CARE_PROVIDER_SITE_OTHER): Payer: Medicare Other | Admitting: Gastroenterology

## 2012-03-21 VITALS — BP 94/54 | HR 84 | Ht 64.0 in | Wt 147.5 lb

## 2012-03-21 DIAGNOSIS — R1310 Dysphagia, unspecified: Secondary | ICD-10-CM | POA: Diagnosis not present

## 2012-03-21 DIAGNOSIS — K219 Gastro-esophageal reflux disease without esophagitis: Secondary | ICD-10-CM

## 2012-03-21 DIAGNOSIS — Z1211 Encounter for screening for malignant neoplasm of colon: Secondary | ICD-10-CM | POA: Diagnosis not present

## 2012-03-21 HISTORY — DX: Gastro-esophageal reflux disease without esophagitis: K21.9

## 2012-03-21 NOTE — Patient Instructions (Addendum)
You have been scheduled for your endoscopy  Separate instructions have been given It has been recommended by Dr Arlyce Dice that you schedule a colonoscopy at your convenience

## 2012-03-21 NOTE — Progress Notes (Signed)
History of Present Illness: Pleasant 66 year old white female with history of esophageal stricture referred at the request of Dr. Tiburcio Pea for evaluation for dysphagia. She's had recurrent dysphagia to solids and now liquids. She has occasional pyrosis. She's taking Prilosec and Reglan. Last dilatation was 2006. She underwent colonoscopy in 2002.    Past Medical History  Diagnosis Date  . IBS (irritable bowel syndrome)   . Hypothyroidism   . Chronic fatigue fibromyalgia syndrome   . Meralgia paraesthetica   . Chronic low back pain   . Anemia     s/p gastric bypass, malabsorption  . B12 deficiency   . Acne rosacea   . Anxiety   . Arthritis   . Depression   . Status post dilation of esophageal narrowing   . GERD (gastroesophageal reflux disease)   . Stroke   . Sjogren's syndrome   . Graves disease   . Lymphedema    Past Surgical History  Procedure Date  . Gastric bypass   . Laminectomy     L3-L5  . Replacement total knee 01/2011    right  . Appendectomy   . Thyroidectomy   . Abdominal hysterectomy   . Endometrial ablation     x 4   family history includes Appendicitis in her paternal grandmother; Dementia in her mother; Heart disease in her father and sister; Irritable bowel syndrome in her sister; Lymphoma in her maternal uncle; Prostate cancer in her maternal grandfather; and Stroke in her maternal grandmother. Current Outpatient Prescriptions  Medication Sig Dispense Refill  . clotrimazole-betamethasone (LOTRISONE) cream Apply 1 application topically 2 (two) times daily.      . cyanocobalamin (,VITAMIN B-12,) 1000 MCG/ML injection Inject 1,000 mcg into the muscle every 30 (thirty) days.      . cyclobenzaprine (FLEXERIL) 10 MG tablet Take 10 mg by mouth 3 (three) times daily as needed.      . dicyclomine (BENTYL) 10 MG capsule Take 10 mg by mouth 4 (four) times daily as needed.      . DULoxetine (CYMBALTA) 30 MG capsule Take 90 mg by mouth daily.      . Ergocalciferol  (VITAMIN D2) 2000 UNITS TABS Take 1 tablet by mouth daily.      . hydrocortisone valerate ointment (WEST-CORT) 0.2 % Apply 1 application topically 2 (two) times daily.      . hyoscyamine (LEVSIN SL) 0.125 MG SL tablet Place 0.125 mg under the tongue every 4 (four) hours as needed.      . LamoTRIgine (LAMICTAL ODT) 50 MG TBDP Take 1 tablet by mouth 3 (three) times daily.      Marland Kitchen levothyroxine (SYNTHROID, LEVOTHROID) 175 MCG tablet Take 175 mcg by mouth daily.      Marland Kitchen lidocaine (LIDODERM) 5 % Place 1 patch onto the skin daily. Remove & Discard patch within 12 hours or as directed by MD      . LORazepam (ATIVAN) 1 MG tablet Take 1 mg by mouth 3 (three) times daily as needed.      . metoCLOPramide (REGLAN) 5 MG tablet Take 5 mg by mouth 4 (four) times daily.      . mupirocin ointment (BACTROBAN) 2 % Apply 1 application topically 3 (three) times daily.      Marland Kitchen omeprazole (PRILOSEC) 40 MG capsule Take 40 mg by mouth daily.      Marland Kitchen oxymorphone (OPANA ER) 30 MG 12 hr tablet Take 30 mg by mouth every 12 (twelve) hours.      Marland Kitchen oxymorphone (OPANA)  10 MG tablet Take 10 mg by mouth every 4 (four) hours as needed.      . traZODone (DESYREL) 100 MG tablet Take 100 mg by mouth at bedtime.      . triamterene-hydrochlorothiazide (MAXZIDE-25) 37.5-25 MG per tablet Take 1 tablet by mouth daily.       Allergies as of 03/21/2012 - Review Complete 03/21/2012  Allergen Reaction Noted  . Actonel (risedronate sodium)  03/18/2012  . Ambien (zolpidem tartrate)  12/24/2011  . Augmentin (amoxicillin-pot clavulanate)  03/18/2012  . Depakote (divalproex sodium)  03/18/2012  . Inderal (propranolol)  12/24/2011  . Latex  12/24/2011  . Nickel  12/24/2011  . Penicillins  03/18/2012    reports that she has never smoked. She has never used smokeless tobacco. She reports that she does not drink alcohol or use illicit drugs.     Review of Systems: She complains of persistent low back pain. She has had a failed knee surgery.  Pertinent positive and negative review of systems were noted in the above HPI section. All other review of systems were otherwise negative.  Vital signs were reviewed in today's medical record Physical Exam: General: Well developed , well nourished, no acute distress Head: Normocephalic and atraumatic Eyes:  sclerae anicteric, EOMI Ears: Normal auditory acuity Mouth: No deformity or lesions Neck: Supple, no masses or thyromegaly Lungs: Clear throughout to auscultation Heart: Regular rate and rhythm; no murmurs, rubs or bruits Abdomen: Soft, non tender and non distended. No masses, hepatosplenomegaly or hernias noted. Normal Bowel sounds Rectal:deferred Musculoskeletal: Symmetrical with no gross deformities  Skin: No lesions on visible extremities Pulses:  Normal pulses noted Extremities: No clubbing, cyanosis, edema or deformities noted Neurological: Alert oriented x 4, grossly nonfocal Cervical Nodes:  No significant cervical adenopathy Inguinal Nodes: No significant inguinal adenopathy Psychological:  Alert and cooperative. Normal mood and affect

## 2012-03-21 NOTE — Assessment & Plan Note (Signed)
Plan screening colonoscopy 

## 2012-03-21 NOTE — Assessment & Plan Note (Addendum)
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

## 2012-03-21 NOTE — Assessment & Plan Note (Addendum)
Patient's symptoms are fairly well-controlled. Because of the potential side effects of Reglan I think it would be ideal to switch from this medicine.  Recommendations #1 trial of dexilant 60 mg daily; discontinue Reglan and Prilosec

## 2012-03-30 ENCOUNTER — Encounter (HOSPITAL_COMMUNITY): Payer: Self-pay | Admitting: Gastroenterology

## 2012-03-30 ENCOUNTER — Other Ambulatory Visit: Payer: Self-pay | Admitting: *Deleted

## 2012-03-30 ENCOUNTER — Ambulatory Visit (HOSPITAL_COMMUNITY)
Admission: RE | Admit: 2012-03-30 | Discharge: 2012-03-30 | Disposition: A | Discharge status: Home / Self Care | Payer: Medicare Other | Source: Ambulatory Visit | Attending: Gastroenterology | Admitting: Gastroenterology

## 2012-03-30 ENCOUNTER — Encounter (HOSPITAL_COMMUNITY)
Admission: RE | Disposition: A | Discharge status: Home / Self Care | Payer: Self-pay | Source: Ambulatory Visit | Attending: Gastroenterology

## 2012-03-30 DIAGNOSIS — E039 Hypothyroidism, unspecified: Secondary | ICD-10-CM | POA: Diagnosis not present

## 2012-03-30 DIAGNOSIS — K589 Irritable bowel syndrome without diarrhea: Secondary | ICD-10-CM | POA: Insufficient documentation

## 2012-03-30 DIAGNOSIS — R109 Unspecified abdominal pain: Secondary | ICD-10-CM

## 2012-03-30 DIAGNOSIS — R1310 Dysphagia, unspecified: Secondary | ICD-10-CM

## 2012-03-30 DIAGNOSIS — K222 Esophageal obstruction: Secondary | ICD-10-CM | POA: Diagnosis not present

## 2012-03-30 DIAGNOSIS — K219 Gastro-esophageal reflux disease without esophagitis: Secondary | ICD-10-CM | POA: Insufficient documentation

## 2012-03-30 DIAGNOSIS — Z79899 Other long term (current) drug therapy: Secondary | ICD-10-CM | POA: Insufficient documentation

## 2012-03-30 HISTORY — PX: ESOPHAGOGASTRODUODENOSCOPY: SHX5428

## 2012-03-30 HISTORY — DX: Esophageal obstruction: K22.2

## 2012-03-30 HISTORY — PX: BALLOON DILATION: SHX5330

## 2012-03-30 SURGERY — EGD (ESOPHAGOGASTRODUODENOSCOPY)
Anesthesia: Moderate Sedation

## 2012-03-30 MED ORDER — GLYCOPYRROLATE 0.2 MG/ML IJ SOLN
INTRAMUSCULAR | Status: AC
  Filled 2012-03-30: qty 1

## 2012-03-30 MED ORDER — DIPHENHYDRAMINE HCL 50 MG/ML IJ SOLN
INTRAMUSCULAR | Status: DC | PRN
  Administered 2012-03-30: 25 mg via INTRAVENOUS

## 2012-03-30 MED ORDER — MIDAZOLAM HCL 10 MG/2ML IJ SOLN
INTRAMUSCULAR | Status: DC | PRN
  Administered 2012-03-30: 1 mg via INTRAVENOUS
  Administered 2012-03-30 (×3): 2 mg via INTRAVENOUS

## 2012-03-30 MED ORDER — SODIUM CHLORIDE 0.9 % IV SOLN
INTRAVENOUS | Status: DC
  Administered 2012-03-30: 500 mL via INTRAVENOUS

## 2012-03-30 MED ORDER — DIPHENHYDRAMINE HCL 50 MG/ML IJ SOLN
INTRAMUSCULAR | Status: AC
  Filled 2012-03-30: qty 1

## 2012-03-30 MED ORDER — MIDAZOLAM HCL 10 MG/2ML IJ SOLN
INTRAMUSCULAR | Status: AC
  Filled 2012-03-30: qty 4

## 2012-03-30 MED ORDER — GLYCOPYRROLATE 0.2 MG/ML IJ SOLN
INTRAMUSCULAR | Status: DC | PRN
  Administered 2012-03-30: 0.2 mg via INTRAVENOUS

## 2012-03-30 MED ORDER — BUTAMBEN-TETRACAINE-BENZOCAINE 2-2-14 % EX AERO
INHALATION_SPRAY | CUTANEOUS | Status: DC | PRN
  Administered 2012-03-30: 2 via TOPICAL

## 2012-03-30 MED ORDER — FENTANYL CITRATE 0.05 MG/ML IJ SOLN
INTRAMUSCULAR | Status: DC | PRN
  Administered 2012-03-30 (×3): 25 ug via INTRAVENOUS

## 2012-03-30 MED ORDER — FENTANYL CITRATE 0.05 MG/ML IJ SOLN
INTRAMUSCULAR | Status: AC
  Filled 2012-03-30: qty 4

## 2012-03-30 NOTE — Op Note (Signed)
[facility] [address] [place], [place]  [address]  ENDOSCOPY PROCEDURE REPORT  PATIENT:  Jillian, Morris  MR#:  [id number] BIRTHDATE:  09/05/1945, 65 yrs. old  GENDER:  female  ENDOSCOPIST:  Barbette Hair. Arlyce Dice, MD Referred by:  Holley Bouche, M.D.  PROCEDURE DATE:  03/30/2012 PROCEDURE:  EGD with balloon dilatation ASA CLASS:  Class II INDICATIONS:  dysphagia  MEDICATIONS:   These medications were titrated to patient response per physician's verbal order, Fentanyl 75 mcg IV, Versed 7 mg IV, Benadryl 25 mg IM, glycopyrrolate (Robinal) 0.2 mg IV TOPICAL ANESTHETIC:  Cetacaine Spray  DESCRIPTION OF PROCEDURE:   After the risks and benefits of the procedure were explained, informed consent was obtained.  The Pentax Gastroscope [id number] endoscope was introduced through the mouth and advanced to the proximal jejunum.  The instrument was slowly withdrawn as the mucosa was fully examined. <<PROCEDUREIMAGES>>  A stricture was found at the gastroesophageal junction. Early esophageal stricture balloon dilation 18mm for 30 seconds. Mild resistance (see image3). No heme  Post-operative change was noted. There was a small gastric pouch. Gastrojejunostomy was fully patent. Normal afferent any further loops (see image1). Retroflexed views revealed not done.  because of small gastric remnant  The scope was then withdrawn from the patient and the procedure completed.  COMPLICATIONS:  None  ENDOSCOPIC IMPRESSION: 1) Stricture at the gastroesophageal junction - status post balloon dilatation 2) Post-operative change RECOMMENDATIONS: 1) dilatations PRN 2) Call office next 2-3 days to schedule an office appointment for 4-6 weeks  ______________________________ Barbette Hair. Arlyce Dice, MD  CC:  n. eSIGNED:   Barbette Hair. Redding Cloe at 03/30/2012 01:15 PM  Brayton Layman, [id number]

## 2012-03-30 NOTE — H&P (View-Only) (Signed)
History of Present Illness: Pleasant 65-year-old white female with history of esophageal stricture referred at the request of Dr. Harris for evaluation for dysphagia. She's had recurrent dysphagia to solids and now liquids. She has occasional pyrosis. She's taking Prilosec and Reglan. Last dilatation was 2006. She underwent colonoscopy in 2002.    Past Medical History  Diagnosis Date  . IBS (irritable bowel syndrome)   . Hypothyroidism   . Chronic fatigue fibromyalgia syndrome   . Meralgia paraesthetica   . Chronic low back pain   . Anemia     s/p gastric bypass, malabsorption  . B12 deficiency   . Acne rosacea   . Anxiety   . Arthritis   . Depression   . Status post dilation of esophageal narrowing   . GERD (gastroesophageal reflux disease)   . Stroke   . Sjogren's syndrome   . Graves disease   . Lymphedema    Past Surgical History  Procedure Date  . Gastric bypass   . Laminectomy     L3-L5  . Replacement total knee 01/2011    right  . Appendectomy   . Thyroidectomy   . Abdominal hysterectomy   . Endometrial ablation     x 4   family history includes Appendicitis in her paternal grandmother; Dementia in her mother; Heart disease in her father and sister; Irritable bowel syndrome in her sister; Lymphoma in her maternal uncle; Prostate cancer in her maternal grandfather; and Stroke in her maternal grandmother. Current Outpatient Prescriptions  Medication Sig Dispense Refill  . clotrimazole-betamethasone (LOTRISONE) cream Apply 1 application topically 2 (two) times daily.      . cyanocobalamin (,VITAMIN B-12,) 1000 MCG/ML injection Inject 1,000 mcg into the muscle every 30 (thirty) days.      . cyclobenzaprine (FLEXERIL) 10 MG tablet Take 10 mg by mouth 3 (three) times daily as needed.      . dicyclomine (BENTYL) 10 MG capsule Take 10 mg by mouth 4 (four) times daily as needed.      . DULoxetine (CYMBALTA) 30 MG capsule Take 90 mg by mouth daily.      . Ergocalciferol  (VITAMIN D2) 2000 UNITS TABS Take 1 tablet by mouth daily.      . hydrocortisone valerate ointment (WEST-CORT) 0.2 % Apply 1 application topically 2 (two) times daily.      . hyoscyamine (LEVSIN SL) 0.125 MG SL tablet Place 0.125 mg under the tongue every 4 (four) hours as needed.      . LamoTRIgine (LAMICTAL ODT) 50 MG TBDP Take 1 tablet by mouth 3 (three) times daily.      . levothyroxine (SYNTHROID, LEVOTHROID) 175 MCG tablet Take 175 mcg by mouth daily.      . lidocaine (LIDODERM) 5 % Place 1 patch onto the skin daily. Remove & Discard patch within 12 hours or as directed by MD      . LORazepam (ATIVAN) 1 MG tablet Take 1 mg by mouth 3 (three) times daily as needed.      . metoCLOPramide (REGLAN) 5 MG tablet Take 5 mg by mouth 4 (four) times daily.      . mupirocin ointment (BACTROBAN) 2 % Apply 1 application topically 3 (three) times daily.      . omeprazole (PRILOSEC) 40 MG capsule Take 40 mg by mouth daily.      . oxymorphone (OPANA ER) 30 MG 12 hr tablet Take 30 mg by mouth every 12 (twelve) hours.      . oxymorphone (OPANA)   10 MG tablet Take 10 mg by mouth every 4 (four) hours as needed.      . traZODone (DESYREL) 100 MG tablet Take 100 mg by mouth at bedtime.      . triamterene-hydrochlorothiazide (MAXZIDE-25) 37.5-25 MG per tablet Take 1 tablet by mouth daily.       Allergies as of 03/21/2012 - Review Complete 03/21/2012  Allergen Reaction Noted  . Actonel (risedronate sodium)  03/18/2012  . Ambien (zolpidem tartrate)  12/24/2011  . Augmentin (amoxicillin-pot clavulanate)  03/18/2012  . Depakote (divalproex sodium)  03/18/2012  . Inderal (propranolol)  12/24/2011  . Latex  12/24/2011  . Nickel  12/24/2011  . Penicillins  03/18/2012    reports that she has never smoked. She has never used smokeless tobacco. She reports that she does not drink alcohol or use illicit drugs.     Review of Systems: She complains of persistent low back pain. She has had a failed knee surgery.  Pertinent positive and negative review of systems were noted in the above HPI section. All other review of systems were otherwise negative.  Vital signs were reviewed in today's medical record Physical Exam: General: Well developed , well nourished, no acute distress Head: Normocephalic and atraumatic Eyes:  sclerae anicteric, EOMI Ears: Normal auditory acuity Mouth: No deformity or lesions Neck: Supple, no masses or thyromegaly Lungs: Clear throughout to auscultation Heart: Regular rate and rhythm; no murmurs, rubs or bruits Abdomen: Soft, non tender and non distended. No masses, hepatosplenomegaly or hernias noted. Normal Bowel sounds Rectal:deferred Musculoskeletal: Symmetrical with no gross deformities  Skin: No lesions on visible extremities Pulses:  Normal pulses noted Extremities: No clubbing, cyanosis, edema or deformities noted Neurological: Alert oriented x 4, grossly nonfocal Cervical Nodes:  No significant cervical adenopathy Inguinal Nodes: No significant inguinal adenopathy Psychological:  Alert and cooperative. Normal mood and affect        

## 2012-03-30 NOTE — Interval H&P Note (Signed)
History and Physical Interval Note:  03/30/2012 12:37 PM  Jillian Morris  has presented today for surgery, with the diagnosis of dysaphagia  The various methods of treatment have been discussed with the patient and family. After consideration of risks, benefits and other options for treatment, the patient has consented to  Procedure(s) (LRB): ESOPHAGOGASTRODUODENOSCOPY (EGD) (N/A) BALLOON DILATION (N/A) as a surgical intervention .  The patient's history has been reviewed, patient examined, no change in status, stable for surgery.  I have reviewed the patient's chart and labs.  Questions were answered to the patient's satisfaction.    The recent H&P (dated *72913**) was reviewed, the patient was examined and there is no change in the patients condition since that H&P was completed.   Jillian Morris  03/30/2012, 12:38 PM    Jillian Morris

## 2012-03-31 ENCOUNTER — Encounter (HOSPITAL_COMMUNITY): Payer: Self-pay

## 2012-03-31 ENCOUNTER — Encounter (HOSPITAL_COMMUNITY): Payer: Self-pay | Admitting: Gastroenterology

## 2012-03-31 DIAGNOSIS — M25569 Pain in unspecified knee: Secondary | ICD-10-CM | POA: Diagnosis not present

## 2012-03-31 DIAGNOSIS — M961 Postlaminectomy syndrome, not elsewhere classified: Secondary | ICD-10-CM | POA: Diagnosis not present

## 2012-03-31 DIAGNOSIS — IMO0002 Reserved for concepts with insufficient information to code with codable children: Secondary | ICD-10-CM | POA: Diagnosis not present

## 2012-04-01 DIAGNOSIS — M48061 Spinal stenosis, lumbar region without neurogenic claudication: Secondary | ICD-10-CM | POA: Diagnosis not present

## 2012-04-01 DIAGNOSIS — M4120 Other idiopathic scoliosis, site unspecified: Secondary | ICD-10-CM | POA: Diagnosis not present

## 2012-04-01 DIAGNOSIS — M47817 Spondylosis without myelopathy or radiculopathy, lumbosacral region: Secondary | ICD-10-CM | POA: Diagnosis not present

## 2012-04-01 DIAGNOSIS — M4310 Spondylolisthesis, site unspecified: Secondary | ICD-10-CM | POA: Diagnosis not present

## 2012-04-19 DIAGNOSIS — E538 Deficiency of other specified B group vitamins: Secondary | ICD-10-CM | POA: Diagnosis not present

## 2012-04-20 ENCOUNTER — Ambulatory Visit (INDEPENDENT_AMBULATORY_CARE_PROVIDER_SITE_OTHER): Payer: Medicare Other | Admitting: Gastroenterology

## 2012-04-20 ENCOUNTER — Encounter: Payer: Self-pay | Admitting: Gastroenterology

## 2012-04-20 VITALS — BP 98/60 | HR 76 | Ht 64.0 in | Wt 150.0 lb

## 2012-04-20 DIAGNOSIS — R1310 Dysphagia, unspecified: Secondary | ICD-10-CM | POA: Diagnosis not present

## 2012-04-20 DIAGNOSIS — K219 Gastro-esophageal reflux disease without esophagitis: Secondary | ICD-10-CM

## 2012-04-20 NOTE — Patient Instructions (Addendum)
Upper GI Endoscopy Upper GI endoscopy means using a flexible scope to look at the esophagus, stomach, and upper small bowel. This is done to make a diagnosis in people with heartburn, abdominal pain, or abnormal bleeding. Sometimes an endoscope is needed to remove foreign bodies or food that become stuck in the esophagus; it can also be used to take biopsy samples. For the best results, do not eat or drink for 8 hours before having your upper endoscopy.  To perform the endoscopy, you will probably be sedated and your throat will be numbed with a special spray. The endoscope is then slowly passed down your throat (this will not interfere with your breathing). An endoscopy exam takes 15 to 30 minutes to complete and there is no real pain. Patients rarely remember much about the procedure. The results of the test may take several days if a biopsy or other test is taken.  You may have a sore throat after an endoscopy exam. Serious complications are very rare. Stick to liquids and soft foods until your pain is better. Do not drive a car or operate any dangerous equipment for at least 24 hours after being sedated. SEEK IMMEDIATE MEDICAL CARE IF:   You have severe throat pain.   You have shortness of breath.   You have bleeding problems.   You have a fever.   You have difficulty recovering from your sedation.  Document Released: 09/17/2004 Document Revised: 07/30/2011 Document Reviewed: 08/12/2008 [employer] Patient Information 2012 [employer]. 

## 2012-04-20 NOTE — Progress Notes (Signed)
History of Present Illness:  Jillian Morris continues to complain of dysphagia to solids and liquids. At endoscopy a possible early distal esophageal stricture was seen and was dilated to 18 mm. Barium swallow from June, 2013 demonstrated esophageal dysmotility. She continues on omeprazole and Reglan. She complains of frequent regurgitation of food and pyrosis.    Review of Systems: Pertinent positive and negative review of systems were noted in the above HPI section. All other review of systems were otherwise negative.    Current Medications, Allergies, Past Medical History, Past Surgical History, Family History and Social History were reviewed in [facility] electronic medical record  Vital signs were reviewed in today's medical record. Physical Exam: General: Well developed , well nourished, no acute distress    

## 2012-04-20 NOTE — Assessment & Plan Note (Signed)
Dysphagia is likely due to esophageal dysmotility. Early achalasia is a possibility.  Recommendations #1 upper endoscopy with Botox injection

## 2012-04-20 NOTE — Assessment & Plan Note (Addendum)
Remains symptomatic despite Reglan and omeprazole  Recommendations #1 trial of dexilant 60 mg one to 2 times a day

## 2012-05-04 ENCOUNTER — Other Ambulatory Visit: Payer: Self-pay | Admitting: Gastroenterology

## 2012-05-04 ENCOUNTER — Encounter (HOSPITAL_COMMUNITY): Payer: Self-pay | Admitting: *Deleted

## 2012-05-04 ENCOUNTER — Ambulatory Visit (HOSPITAL_COMMUNITY)
Admission: RE | Admit: 2012-05-04 | Discharge: 2012-05-04 | Disposition: A | Discharge status: Home / Self Care | Payer: Medicare Other | Source: Ambulatory Visit | Attending: Gastroenterology | Admitting: Gastroenterology

## 2012-05-04 ENCOUNTER — Encounter (HOSPITAL_COMMUNITY)
Admission: RE | Disposition: A | Discharge status: Home / Self Care | Payer: Self-pay | Source: Ambulatory Visit | Attending: Gastroenterology

## 2012-05-04 DIAGNOSIS — R1310 Dysphagia, unspecified: Secondary | ICD-10-CM | POA: Insufficient documentation

## 2012-05-04 DIAGNOSIS — R1110 Vomiting, unspecified: Secondary | ICD-10-CM | POA: Insufficient documentation

## 2012-05-04 DIAGNOSIS — K219 Gastro-esophageal reflux disease without esophagitis: Secondary | ICD-10-CM

## 2012-05-04 DIAGNOSIS — Z9884 Bariatric surgery status: Secondary | ICD-10-CM | POA: Diagnosis not present

## 2012-05-04 HISTORY — DX: Personal history of other diseases of the digestive system: Z87.19

## 2012-05-04 HISTORY — PX: ESOPHAGOGASTRODUODENOSCOPY: SHX5428

## 2012-05-04 HISTORY — DX: Fibromyalgia: M79.7

## 2012-05-04 SURGERY — EGD (ESOPHAGOGASTRODUODENOSCOPY)
Anesthesia: Moderate Sedation

## 2012-05-04 MED ORDER — FENTANYL CITRATE 0.05 MG/ML IJ SOLN
INTRAMUSCULAR | Status: AC
  Filled 2012-05-04: qty 4

## 2012-05-04 MED ORDER — DIPHENHYDRAMINE HCL 50 MG/ML IJ SOLN
INTRAMUSCULAR | Status: DC | PRN
  Administered 2012-05-04: 25 mg via INTRAVENOUS

## 2012-05-04 MED ORDER — BUTAMBEN-TETRACAINE-BENZOCAINE 2-2-14 % EX AERO
INHALATION_SPRAY | CUTANEOUS | Status: DC | PRN
  Administered 2012-05-04: 2 via TOPICAL

## 2012-05-04 MED ORDER — MIDAZOLAM HCL 10 MG/2ML IJ SOLN
INTRAMUSCULAR | Status: DC | PRN
  Administered 2012-05-04 (×3): 2.5 mg via INTRAVENOUS

## 2012-05-04 MED ORDER — DIPHENHYDRAMINE HCL 50 MG/ML IJ SOLN
INTRAMUSCULAR | Status: AC
  Filled 2012-05-04: qty 1

## 2012-05-04 MED ORDER — MIDAZOLAM HCL 10 MG/2ML IJ SOLN
INTRAMUSCULAR | Status: AC
  Filled 2012-05-04: qty 4

## 2012-05-04 MED ORDER — SODIUM CHLORIDE 0.9 % IJ SOLN
100.0000 [IU] | Freq: Once | INTRAMUSCULAR | Status: DC
  Filled 2012-05-04: qty 100

## 2012-05-04 MED ORDER — FENTANYL CITRATE 0.05 MG/ML IJ SOLN
INTRAMUSCULAR | Status: DC | PRN
  Administered 2012-05-04 (×3): 25 ug via INTRAVENOUS

## 2012-05-04 MED ORDER — GLYCOPYRROLATE 0.2 MG/ML IJ SOLN
INTRAMUSCULAR | Status: DC | PRN
  Administered 2012-05-04: 0.2 mg via INTRAVENOUS

## 2012-05-04 MED ORDER — DEXLANSOPRAZOLE 60 MG PO CPDR: 60.0000 mg | DELAYED_RELEASE_CAPSULE | Freq: Every day | ORAL | Status: DC

## 2012-05-04 MED ORDER — GLYCOPYRROLATE 0.2 MG/ML IJ SOLN
INTRAMUSCULAR | Status: AC
  Filled 2012-05-04: qty 1

## 2012-05-04 MED ORDER — SODIUM CHLORIDE 0.9 % IV SOLN
INTRAVENOUS | Status: DC
  Administered 2012-05-04: 09:00:00 via INTRAVENOUS

## 2012-05-04 NOTE — H&P (View-Only) (Signed)
History of Present Illness:  Jillian Morris continues to complain of dysphagia to solids and liquids. At endoscopy a possible early distal esophageal stricture was seen and was dilated to 18 mm. Barium swallow from June, 2013 demonstrated esophageal dysmotility. She continues on omeprazole and Reglan. She complains of frequent regurgitation of food and pyrosis.    Review of Systems: Pertinent positive and negative review of systems were noted in the above HPI section. All other review of systems were otherwise negative.    Current Medications, Allergies, Past Medical History, Past Surgical History, Family History and Social History were reviewed in [employer] electronic medical record  Vital signs were reviewed in today's medical record. Physical Exam: General: Well developed , well nourished, no acute distress

## 2012-05-04 NOTE — Interval H&P Note (Signed)
History and Physical Interval Note:  05/04/2012 9:49 AM  [name]  has presented today for surgery, with the diagnosis of dysphagia  The various methods of treatment have been discussed with the patient and family. After consideration of risks, benefits and other options for treatment, the patient has consented to  Procedure(s) (LRB) with comments: ESOPHAGOGASTRODUODENOSCOPY (EGD) (N/A) BOTOX INJECTION (N/A) as a surgical intervention .  The patient's history has been reviewed, patient examined, no change in status, stable for surgery.  I have reviewed the patient's chart and labs.  Questions were answered to the patient's satisfaction.     The recent H&P (dated *04/20/12 **) was reviewed, the patient was examined and there is no change in the patients condition since that H&P was completed.   [name]  05/04/2012, 9:49 AM   [name]

## 2012-05-04 NOTE — Op Note (Signed)
[facility] [address] [place], [address]   ENDOSCOPY PROCEDURE REPORT  PATIENT: Jillian Morris, Jillian Morris  MR#: [id number] BIRTHDATE: 1945-09-14 , 65  yrs. old GENDER: Female ENDOSCOPIST: Louis Meckel, MD REFERRED BY:  Holley Bouche, M.D. PROCEDURE DATE:  05/04/2012 PROCEDURE:  EGD w/ directed submucosal injection(s), any substance ASA CLASS:     Class II INDICATIONS:  dysphagia. MEDICATIONS: These medications were titrated to patient response per physician's verbal order, Fentanyl 75 mcg IV, Versed, Versed-Detailed 7.5 mg IV, Benadryl 25 mg IV, and Robinul 0.2 mg IV  TOPICAL ANESTHETIC: Cetacaine Spray  DESCRIPTION OF PROCEDURE: After the risks benefits and alternatives of the procedure were thoroughly explained, informed consent was obtained.  The Pentax Gastroscope [id number] endoscope was introduced through the mouth and advanced to the proximal jejunum. Without limitations.  The instrument was slowly withdrawn as the mucosa was fully examined.      STOMACH: A small gastric remnant is present.  Gastrojejunostomy is normal.  Normal afferent and efferent loops.  The remainder of the upper endoscopy exam was otherwise normal. Botox 25 units was injected submucosally at the GE junction into each of the 4 quadrants.  Injections were 1 cc each.  Retroflexed views revealed no abnormalities.     The scope was then withdrawn from the patient and the procedure completed.  COMPLICATIONS: There were no complications.  ENDOSCOPIC IMPRESSION:subtotal gastrectomy Status post Botox injection  RECOMMENDATIONS: Call to schedule a follow-up appointment with office 1 month(s)  REPEAT EXAM:  eSigned:  Louis Meckel, MD 05/04/2012 10:20 AM   CC:

## 2012-05-05 ENCOUNTER — Encounter (HOSPITAL_COMMUNITY): Payer: Self-pay

## 2012-05-05 ENCOUNTER — Encounter (HOSPITAL_COMMUNITY): Payer: Self-pay | Admitting: Gastroenterology

## 2012-05-06 DIAGNOSIS — IMO0002 Reserved for concepts with insufficient information to code with codable children: Secondary | ICD-10-CM | POA: Diagnosis not present

## 2012-05-09 ENCOUNTER — Ambulatory Visit: Payer: Medicare Other | Admitting: Gastroenterology

## 2012-05-24 DIAGNOSIS — E538 Deficiency of other specified B group vitamins: Secondary | ICD-10-CM | POA: Diagnosis not present

## 2012-05-24 DIAGNOSIS — Z23 Encounter for immunization: Secondary | ICD-10-CM | POA: Diagnosis not present

## 2012-05-26 DIAGNOSIS — M961 Postlaminectomy syndrome, not elsewhere classified: Secondary | ICD-10-CM | POA: Diagnosis not present

## 2012-05-26 DIAGNOSIS — IMO0002 Reserved for concepts with insufficient information to code with codable children: Secondary | ICD-10-CM | POA: Diagnosis not present

## 2012-06-01 DIAGNOSIS — R5381 Other malaise: Secondary | ICD-10-CM | POA: Diagnosis not present

## 2012-06-01 DIAGNOSIS — N183 Chronic kidney disease, stage 3 unspecified: Secondary | ICD-10-CM | POA: Diagnosis not present

## 2012-06-01 DIAGNOSIS — D649 Anemia, unspecified: Secondary | ICD-10-CM | POA: Diagnosis not present

## 2012-06-01 DIAGNOSIS — R5383 Other fatigue: Secondary | ICD-10-CM | POA: Diagnosis not present

## 2012-06-01 DIAGNOSIS — E538 Deficiency of other specified B group vitamins: Secondary | ICD-10-CM | POA: Diagnosis not present

## 2012-06-01 DIAGNOSIS — E039 Hypothyroidism, unspecified: Secondary | ICD-10-CM | POA: Diagnosis not present

## 2012-06-09 ENCOUNTER — Ambulatory Visit: Payer: BC Managed Care – HMO | Admitting: Gastroenterology

## 2012-06-10 IMAGING — NM NM BONE 3 PHASE
2 series · 12 of 12 positions shown · non-contrast
Comparison: None.

CLINICAL DATA: Right knee pain and swelling.  Total right knee
arthroplasty 1 year ago.

NUCLEAR MEDICINE THREE PHASE BONE SCAN
TECHNIQUE: Radionuclide angiographic images, immediate static
blood pool images, and 3-hour delayed static images were obtained
after intravenous injection of radiopharmaceutical.
Radiopharmaceutical: [id number] JOSUETT MEIRAV TECHNETIUM TC 99M
MEDRONATE IV KIT

[Series 1: fl flow and static · 4.75mm/px · 6 of 40 frames shown (1 of 2)]
[frame 4/40]
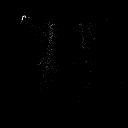
[frame 10/40]
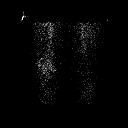
[frame 17/40]
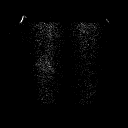
[frame 24/40]
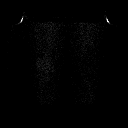
[frame 30/40]
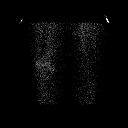
[frame 37/40]
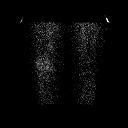

[Series 1: fl flow and static · 4.75mm/px · 6 of 40 frames shown (2 of 2)]
[frame 4/40]
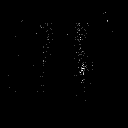
[frame 10/40  full-range]
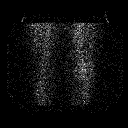
[frame 17/40  full-range]
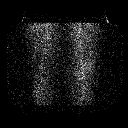
[frame 24/40  full-range]
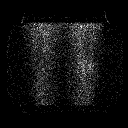
[frame 30/40  full-range]
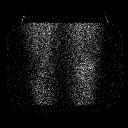
[frame 37/40  full-range]
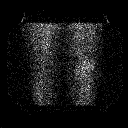

[12 of 12 positions shown; findings below may reference images not displayed]

FINDINGS: Initial flow images demonstrate asymmetric flow to the
right lower extremity compared to the left.  The immediate static
images demonstrate increased uptake around the right knee
prosthesis and delayed images also demonstrate a moderate degree of
persistent uptake around the right knee.  This is more activity
than I would expect 1 year after surgery and findings suspicious
for loosening or infection.
IMPRESSION: Diffuse increased uptake around the right knee prosthesis could be
secondary to loosening or infection.

## 2012-06-21 ENCOUNTER — Telehealth: Payer: Self-pay | Admitting: Gastroenterology

## 2012-06-21 NOTE — Telephone Encounter (Signed)
Message copied by Leanor Kail I on Tue Jun 21, 2012  8:03 AM ------      Message from: Mckinley Jewel, AMY L      Created: Thu Jun 09, 2012  3:01 PM                   ----- Message -----         From: Marlowe Kays, CMA         Sent: 06/09/2012   2:07 PM           To: Amy Duwaine Maxin, CNA            OK No charge      ----- Message -----         From: Otto Herb, CNA         Sent: 06/09/2012   1:48 PM           To: Marlowe Kays, CMA            Pt called at 1:45pm and said she would have to r/s.  She said she has a bad back and could not hardly walk to come today.

## 2012-06-23 DIAGNOSIS — F3189 Other bipolar disorder: Secondary | ICD-10-CM | POA: Diagnosis not present

## 2012-06-28 DIAGNOSIS — D518 Other vitamin B12 deficiency anemias: Secondary | ICD-10-CM | POA: Diagnosis not present

## 2012-07-07 DIAGNOSIS — M47817 Spondylosis without myelopathy or radiculopathy, lumbosacral region: Secondary | ICD-10-CM | POA: Diagnosis not present

## 2012-07-07 DIAGNOSIS — M961 Postlaminectomy syndrome, not elsewhere classified: Secondary | ICD-10-CM | POA: Diagnosis not present

## 2012-07-07 DIAGNOSIS — M2550 Pain in unspecified joint: Secondary | ICD-10-CM | POA: Diagnosis not present

## 2012-07-13 ENCOUNTER — Ambulatory Visit (INDEPENDENT_AMBULATORY_CARE_PROVIDER_SITE_OTHER): Payer: Medicare Other | Admitting: Gastroenterology

## 2012-07-13 ENCOUNTER — Encounter: Payer: Self-pay | Admitting: Gastroenterology

## 2012-07-13 VITALS — BP 114/62 | HR 68 | Ht 64.0 in | Wt 156.0 lb

## 2012-07-13 DIAGNOSIS — R1310 Dysphagia, unspecified: Secondary | ICD-10-CM | POA: Diagnosis not present

## 2012-07-13 NOTE — Assessment & Plan Note (Signed)
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  Plan repeat botox injection or consideration of myotomy (with manometry first) when she becomes symptomatic again.

## 2012-07-13 NOTE — Patient Instructions (Signed)
Please follow up as needed 

## 2012-07-13 NOTE — Progress Notes (Signed)
History of Present Illness:  Swallowing improved since botox injection.  Only complaint is some difficulty initiating swallow secondary to dry mouth.    Review of Systems: Pertinent positive and negative review of systems were noted in the above HPI section. All other review of systems were otherwise negative.    Current Medications, Allergies, Past Medical History, Past Surgical History, Family History and Social History were reviewed in [employer] electronic medical record  Vital signs were reviewed in today's medical record. Physical Exam: General: Well developed , well nourished, no acute distress

## 2012-07-14 DIAGNOSIS — E039 Hypothyroidism, unspecified: Secondary | ICD-10-CM | POA: Diagnosis not present

## 2012-08-02 DIAGNOSIS — E538 Deficiency of other specified B group vitamins: Secondary | ICD-10-CM | POA: Diagnosis not present

## 2012-08-12 ENCOUNTER — Other Ambulatory Visit: Payer: Self-pay | Admitting: Family Medicine

## 2012-08-12 DIAGNOSIS — M961 Postlaminectomy syndrome, not elsewhere classified: Secondary | ICD-10-CM | POA: Diagnosis not present

## 2012-08-12 DIAGNOSIS — R42 Dizziness and giddiness: Secondary | ICD-10-CM

## 2012-08-12 DIAGNOSIS — M545 Low back pain, unspecified: Secondary | ICD-10-CM | POA: Diagnosis not present

## 2012-08-12 DIAGNOSIS — M810 Age-related osteoporosis without current pathological fracture: Secondary | ICD-10-CM | POA: Diagnosis not present

## 2012-08-12 DIAGNOSIS — N183 Chronic kidney disease, stage 3 unspecified: Secondary | ICD-10-CM | POA: Diagnosis not present

## 2012-08-12 DIAGNOSIS — R569 Unspecified convulsions: Secondary | ICD-10-CM | POA: Diagnosis not present

## 2012-08-12 DIAGNOSIS — M4120 Other idiopathic scoliosis, site unspecified: Secondary | ICD-10-CM | POA: Diagnosis not present

## 2012-08-12 DIAGNOSIS — R269 Unspecified abnormalities of gait and mobility: Secondary | ICD-10-CM | POA: Diagnosis not present

## 2012-08-12 DIAGNOSIS — E039 Hypothyroidism, unspecified: Secondary | ICD-10-CM | POA: Diagnosis not present

## 2012-08-12 DIAGNOSIS — M2550 Pain in unspecified joint: Secondary | ICD-10-CM | POA: Diagnosis not present

## 2012-08-12 DIAGNOSIS — M25569 Pain in unspecified knee: Secondary | ICD-10-CM | POA: Diagnosis not present

## 2012-08-19 ENCOUNTER — Ambulatory Visit
Admission: RE | Admit: 2012-08-19 | Discharge: 2012-08-19 | Disposition: A | Discharge status: Home / Self Care | Payer: Medicare Other | Source: Ambulatory Visit | Attending: Family Medicine | Admitting: Family Medicine

## 2012-08-19 DIAGNOSIS — R4789 Other speech disturbances: Secondary | ICD-10-CM | POA: Diagnosis not present

## 2012-08-19 DIAGNOSIS — R42 Dizziness and giddiness: Secondary | ICD-10-CM | POA: Diagnosis not present

## 2012-08-19 MED ORDER — IOHEXOL 300 MG/ML  SOLN
75.0000 mL | Freq: Once | INTRAMUSCULAR | Status: AC | PRN
  Administered 2012-08-19: 75 mL via INTRAVENOUS

## 2012-09-06 DIAGNOSIS — M961 Postlaminectomy syndrome, not elsewhere classified: Secondary | ICD-10-CM | POA: Diagnosis not present

## 2012-09-06 DIAGNOSIS — M2550 Pain in unspecified joint: Secondary | ICD-10-CM | POA: Diagnosis not present

## 2012-09-29 DIAGNOSIS — E039 Hypothyroidism, unspecified: Secondary | ICD-10-CM | POA: Diagnosis not present

## 2012-10-04 DIAGNOSIS — M961 Postlaminectomy syndrome, not elsewhere classified: Secondary | ICD-10-CM | POA: Diagnosis not present

## 2012-10-04 DIAGNOSIS — M79609 Pain in unspecified limb: Secondary | ICD-10-CM | POA: Diagnosis not present

## 2012-11-01 DIAGNOSIS — E039 Hypothyroidism, unspecified: Secondary | ICD-10-CM | POA: Diagnosis not present

## 2012-11-08 DIAGNOSIS — E0500 Thyrotoxicosis with diffuse goiter without thyrotoxic crisis or storm: Secondary | ICD-10-CM | POA: Diagnosis not present

## 2012-11-08 DIAGNOSIS — E890 Postprocedural hypothyroidism: Secondary | ICD-10-CM | POA: Diagnosis not present

## 2012-11-10 DIAGNOSIS — D518 Other vitamin B12 deficiency anemias: Secondary | ICD-10-CM | POA: Diagnosis not present

## 2012-11-15 ENCOUNTER — Telehealth: Payer: Self-pay | Admitting: Gastroenterology

## 2012-11-15 NOTE — Telephone Encounter (Signed)
Pt states she is having difficulty swallowing. States she received botox several months ago and it helped but she thinks it has "run out of juice." Pt requests to be seen. Pt scheduled to see Dr. Arlyce Dice 11/21/12@10 :30am. Pt aware of appt date and time.

## 2012-11-21 ENCOUNTER — Ambulatory Visit: Payer: Medicare Other | Admitting: Gastroenterology

## 2012-11-21 ENCOUNTER — Telehealth: Payer: Self-pay | Admitting: Gastroenterology

## 2012-11-21 NOTE — Telephone Encounter (Signed)
No charge. 

## 2012-11-22 DIAGNOSIS — IMO0002 Reserved for concepts with insufficient information to code with codable children: Secondary | ICD-10-CM | POA: Diagnosis not present

## 2012-11-22 DIAGNOSIS — M961 Postlaminectomy syndrome, not elsewhere classified: Secondary | ICD-10-CM | POA: Diagnosis not present

## 2012-12-08 DIAGNOSIS — D518 Other vitamin B12 deficiency anemias: Secondary | ICD-10-CM | POA: Diagnosis not present

## 2012-12-21 ENCOUNTER — Ambulatory Visit (INDEPENDENT_AMBULATORY_CARE_PROVIDER_SITE_OTHER): Payer: Medicare Other | Admitting: Gastroenterology

## 2012-12-21 ENCOUNTER — Encounter: Payer: Self-pay | Admitting: Gastroenterology

## 2012-12-21 VITALS — BP 100/60 | HR 68 | Ht 64.0 in | Wt 155.8 lb

## 2012-12-21 DIAGNOSIS — R1319 Other dysphagia: Secondary | ICD-10-CM

## 2012-12-21 DIAGNOSIS — R1310 Dysphagia, unspecified: Secondary | ICD-10-CM

## 2012-12-21 NOTE — Assessment & Plan Note (Addendum)
Patient's dysphagia appears to be secondary  to a motility disorder. She may have early achalasia.  She has  had an excellent response to Botox injection.  I again discussed therapeutic options including repeat Botox injection versus surgical myotomy. Should she have achalasia then I am more inclined toward myotomy provided that it can be done through a laparoscope.  Plan is to proceed with formal esophageal manometry. If the test is diagnostic for achalasia she will proceed with surgical myotomy.  Short of that I will repeat Botox injection.

## 2012-12-21 NOTE — Patient Instructions (Addendum)
You have been scheduled for an esophageal manometry at [facility] on 5/8 at 8am. Please arrive 30 minutes prior to your procedure for registration. You will need to go to outpatient registration (1st floor of the hospital) first. Make certain to bring your insurance cards as well as a complete list of medications.  Please remember the following:  1) Nothing to eat or drink after 12:00 midnight on the night before your test.  2) Hold all diabetic medications/insulin the morning of the test. You may eat and take            your medications after the test.  3) For 3 days prior to your test do not take: Dexilant, Prevacid, Nexium, Protonix,         Aciphex, Zegerid, Pantoprazole, Prilosec or omeprazole.  4) For 2 days prior to your test, do not take: Reglan, Tagamet, Zantac, Axid or Pepcid.  5) You MAY use an antacid such as Rolaids or Tums up to 12 hours prior to your test.  It will take at least 2 weeks to receive the results of this test from your physician. ------------------------------------------ ABOUT ESOPHAGEAL MANOMETRY Esophageal manometry (muh-NOM-uh-tree) is a test that gauges how well your esophagus works. Your esophagus is the long, muscular tube that connects your throat to your stomach. Esophageal manometry measures the rhythmic muscle contractions (peristalsis) that occur in your esophagus when you swallow. Esophageal manometry also measures the coordination and force exerted by the muscles of your esophagus.  During esophageal manometry, a thin, flexible tube (catheter) that contains sensors is passed through your nose, down your esophagus and into your stomach. Esophageal manometry can be helpful in diagnosing some mostly uncommon disorders that affect your esophagus.  Why it's done Esophageal manometry is used to evaluate the movement (motility) of food through the esophagus and into the stomach. The test measures how well the circular bands of muscle (sphincters) at  the top and bottom of your esophagus open and close, as well as the pressure, strength and pattern of the wave of esophageal muscle contractions that moves food along.  What you can expect Esophageal manometry is an outpatient procedure done without sedation. Most people tolerate it well. You may be asked to change into a hospital gown before the test starts.  During esophageal manometry  While you are sitting up, a member of your health care team sprays your throat with a numbing medication or puts numbing gel in your nose or both.  A catheter is guided through your nose into your esophagus. The catheter may be sheathed in a water-filled sleeve. It doesn't interfere with your breathing. However, your eyes may water, and you may gag. You may have a slight nosebleed from irritation.  After the catheter is in place, you may be asked to lie on your back on an exam table, or you may be asked to remain seated.  You then swallow small sips of water. As you do, a computer connected to the catheter records the pressure, strength and pattern of your esophageal muscle contractions.  During the test, you'll be asked to breathe slowly and smoothly, remain as still as possible, and swallow only when you're asked to do so.  A member of your health care team may move the catheter down into your stomach while the catheter continues its measurements.  The catheter then is slowly withdrawn. The test usually lasts 20 to 30 minutes.  After esophageal manometry  When your esophageal manometry is complete, you may  return to your normal activities  This test typically takes 30-45 minutes to complete. ________________________________________________________________________________

## 2012-12-21 NOTE — Progress Notes (Signed)
History of Present Illness:  The patient has returned for reevaluation of dysphagia. She has dysphagia mostly to solids. In September, 2013 she underwent Botox injection of her LES. Prior dilatation was not particularly effective. Prior swallow demonstrated esophageal dysmotility. She's without chest pain or pyrosis.    Review of Systems: Pertinent positive and negative review of systems were noted in the above HPI section. All other review of systems were otherwise negative.    Current Medications, Allergies, Past Medical History, Past Surgical History, Family History and Social History were reviewed in [employer] electronic medical record  Vital signs were reviewed in today's medical record. Physical Exam: General: Well developed , well nourished, no acute distress Skin: anicteric Head: Normocephalic and atraumatic Eyes:  sclerae anicteric, EOMI Ears: Normal auditory acuity Mouth: No deformity or lesions Lungs: Clear throughout to auscultation Heart: Regular rate and rhythm; no murmurs, rubs or bruits Abdomen: Soft, non tender and non distended. No masses, hepatosplenomegaly or hernias noted. Normal Bowel sounds Rectal:deferred Musculoskeletal: Symmetrical with no gross deformities  Pulses:  Normal pulses noted Extremities: No clubbing, cyanosis, edema or deformities noted Neurological: Alert oriented x 4, grossly nonfocal Psychological:  Alert and cooperative. Normal mood and affect

## 2012-12-26 DIAGNOSIS — G479 Sleep disorder, unspecified: Secondary | ICD-10-CM | POA: Diagnosis not present

## 2012-12-26 DIAGNOSIS — N183 Chronic kidney disease, stage 3 unspecified: Secondary | ICD-10-CM | POA: Diagnosis not present

## 2012-12-26 DIAGNOSIS — F329 Major depressive disorder, single episode, unspecified: Secondary | ICD-10-CM | POA: Diagnosis not present

## 2013-01-02 ENCOUNTER — Encounter (HOSPITAL_COMMUNITY): Admission: RE | Payer: Self-pay | Source: Ambulatory Visit

## 2013-01-02 SURGERY — MANOMETRY, ESOPHAGUS

## 2013-01-11 ENCOUNTER — Ambulatory Visit (HOSPITAL_COMMUNITY): Admission: RE | Admit: 2013-01-11 | Payer: Medicare Other | Source: Ambulatory Visit | Admitting: Gastroenterology

## 2013-01-11 DIAGNOSIS — E890 Postprocedural hypothyroidism: Secondary | ICD-10-CM | POA: Diagnosis not present

## 2013-01-11 DIAGNOSIS — D518 Other vitamin B12 deficiency anemias: Secondary | ICD-10-CM | POA: Diagnosis not present

## 2013-01-12 DIAGNOSIS — E890 Postprocedural hypothyroidism: Secondary | ICD-10-CM | POA: Diagnosis not present

## 2013-01-12 DIAGNOSIS — E0500 Thyrotoxicosis with diffuse goiter without thyrotoxic crisis or storm: Secondary | ICD-10-CM | POA: Diagnosis not present

## 2013-02-07 DIAGNOSIS — Z79899 Other long term (current) drug therapy: Secondary | ICD-10-CM | POA: Diagnosis not present

## 2013-02-07 DIAGNOSIS — M961 Postlaminectomy syndrome, not elsewhere classified: Secondary | ICD-10-CM | POA: Diagnosis not present

## 2013-02-07 DIAGNOSIS — M47817 Spondylosis without myelopathy or radiculopathy, lumbosacral region: Secondary | ICD-10-CM | POA: Diagnosis not present

## 2013-02-07 DIAGNOSIS — M1990 Unspecified osteoarthritis, unspecified site: Secondary | ICD-10-CM | POA: Diagnosis not present

## 2013-02-08 DIAGNOSIS — D518 Other vitamin B12 deficiency anemias: Secondary | ICD-10-CM | POA: Diagnosis not present

## 2013-02-16 ENCOUNTER — Other Ambulatory Visit: Payer: Self-pay | Admitting: Gastroenterology

## 2013-02-21 DIAGNOSIS — J40 Bronchitis, not specified as acute or chronic: Secondary | ICD-10-CM | POA: Diagnosis not present

## 2013-03-09 DIAGNOSIS — G479 Sleep disorder, unspecified: Secondary | ICD-10-CM | POA: Diagnosis not present

## 2013-03-09 DIAGNOSIS — J069 Acute upper respiratory infection, unspecified: Secondary | ICD-10-CM | POA: Diagnosis not present

## 2013-03-09 DIAGNOSIS — M25569 Pain in unspecified knee: Secondary | ICD-10-CM | POA: Diagnosis not present

## 2013-03-09 DIAGNOSIS — E039 Hypothyroidism, unspecified: Secondary | ICD-10-CM | POA: Diagnosis not present

## 2013-03-09 DIAGNOSIS — E538 Deficiency of other specified B group vitamins: Secondary | ICD-10-CM | POA: Diagnosis not present

## 2013-04-11 DIAGNOSIS — M47817 Spondylosis without myelopathy or radiculopathy, lumbosacral region: Secondary | ICD-10-CM | POA: Diagnosis not present

## 2013-04-11 DIAGNOSIS — M47812 Spondylosis without myelopathy or radiculopathy, cervical region: Secondary | ICD-10-CM | POA: Diagnosis not present

## 2013-04-11 DIAGNOSIS — M961 Postlaminectomy syndrome, not elsewhere classified: Secondary | ICD-10-CM | POA: Diagnosis not present

## 2013-04-13 ENCOUNTER — Telehealth: Payer: Self-pay | Admitting: Gastroenterology

## 2013-04-14 NOTE — Telephone Encounter (Signed)
Pt states she needs an appt to discuss the manometry and if she needs to reschedule it. Pt scheduled to see Dr. Arlyce Dice 05/01/13@9 :30am. Pt aware of appt date and time.

## 2013-04-19 DIAGNOSIS — M549 Dorsalgia, unspecified: Secondary | ICD-10-CM | POA: Diagnosis not present

## 2013-04-19 DIAGNOSIS — J069 Acute upper respiratory infection, unspecified: Secondary | ICD-10-CM | POA: Diagnosis not present

## 2013-04-19 DIAGNOSIS — D518 Other vitamin B12 deficiency anemias: Secondary | ICD-10-CM | POA: Diagnosis not present

## 2013-04-26 DIAGNOSIS — M47812 Spondylosis without myelopathy or radiculopathy, cervical region: Secondary | ICD-10-CM | POA: Diagnosis not present

## 2013-04-26 DIAGNOSIS — M47817 Spondylosis without myelopathy or radiculopathy, lumbosacral region: Secondary | ICD-10-CM | POA: Diagnosis not present

## 2013-04-26 DIAGNOSIS — M961 Postlaminectomy syndrome, not elsewhere classified: Secondary | ICD-10-CM | POA: Diagnosis not present

## 2013-04-26 DIAGNOSIS — Z79899 Other long term (current) drug therapy: Secondary | ICD-10-CM | POA: Diagnosis not present

## 2013-05-01 ENCOUNTER — Encounter: Payer: Self-pay | Admitting: Gastroenterology

## 2013-05-01 ENCOUNTER — Ambulatory Visit (INDEPENDENT_AMBULATORY_CARE_PROVIDER_SITE_OTHER): Payer: Medicare Other | Admitting: Gastroenterology

## 2013-05-01 VITALS — BP 112/72 | HR 84 | Ht 63.0 in | Wt 164.0 lb

## 2013-05-01 DIAGNOSIS — R1310 Dysphagia, unspecified: Secondary | ICD-10-CM

## 2013-05-01 DIAGNOSIS — J029 Acute pharyngitis, unspecified: Secondary | ICD-10-CM | POA: Diagnosis not present

## 2013-05-01 NOTE — Patient Instructions (Addendum)
You have been scheduled for an esophageal manometry at [facility] on 05-08-2013 at 1 pm. Please arrive 30 minutes prior to your procedure for registration. You will need to go to outpatient registration (1st floor of the hospital) first. Make certain to bring your insurance cards as well as a complete list of medications.  Please remember the following:  1) Nothing to eat or drink after 12:00 midnight on the night before your test.  2) Hold all diabetic medications/insulin the morning of the test. You may eat and take your medications after the test.  3) For 3 days prior to your test do not take: Dexilant, Prevacid, Nexium, Protonix, Aciphex, Zegerid, Pantoprazole, Prilosec or omeprazole.  4) For 2 days prior to your test, do not take: Reglan, Tagamet, Zantac, Axid or Pepcid.  5) You MAY use an antacid such as Rolaids or Tums up to 12 hours prior to your test.  It will take at least 2 weeks to receive the results of this test from your physician. ------------------------------------------ ABOUT ESOPHAGEAL MANOMETRY Esophageal manometry (muh-NOM-uh-tree) is a test that gauges how well your esophagus works. Your esophagus is the long, muscular tube that connects your throat to your stomach. Esophageal manometry measures the rhythmic muscle contractions (peristalsis) that occur in your esophagus when you swallow. Esophageal manometry also measures the coordination and force exerted by the muscles of your esophagus.  During esophageal manometry, a thin, flexible tube (catheter) that contains sensors is passed through your nose, down your esophagus and into your stomach. Esophageal manometry can be helpful in diagnosing some mostly uncommon disorders that affect your esophagus.  Why it's done Esophageal manometry is used to evaluate the movement (motility) of food through the esophagus and into the stomach. The test measures how well the circular bands of muscle (sphincters) at the top and  bottom of your esophagus open and close, as well as the pressure, strength and pattern of the wave of esophageal muscle contractions that moves food along.  What you can expect Esophageal manometry is an outpatient procedure done without sedation. Most people tolerate it well. You may be asked to change into a hospital gown before the test starts.  During esophageal manometry  While you are sitting up, a member of your health care team sprays your throat with a numbing medication or puts numbing gel in your nose or both.  A catheter is guided through your nose into your esophagus. The catheter may be sheathed in a water-filled sleeve. It doesn't interfere with your breathing. However, your eyes may water, and you may gag. You may have a slight nosebleed from irritation.  After the catheter is in place, you may be asked to lie on your back on an exam table, or you may be asked to remain seated.  You then swallow small sips of water. As you do, a computer connected to the catheter records the pressure, strength and pattern of your esophageal muscle contractions.  During the test, you'll be asked to breathe slowly and smoothly, remain as still as possible, and swallow only when you're asked to do so.  A member of your health care team may move the catheter down into your stomach while the catheter continues its measurements.  The catheter then is slowly withdrawn. The test usually lasts 20 to 30 minutes.  After esophageal manometry  When your esophageal manometry is complete, you may return to your normal activities  This test typically takes 30-45 minutes to complete. ________________________________________________________________________________

## 2013-05-01 NOTE — Progress Notes (Signed)
History of Present Illness:  The patient has returned for reevaluation of dysphagia.  She has dysphagia solids and liquids.  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  Esophageal manometry has been discussed in anticipation of a more definite procedure such as a surgical myotomy or forceful balloon dilatation.  Last Botox injection was one year ago.    Review of Systems: Pertinent positive and negative review of systems were noted in the above HPI section. All other review of systems were otherwise negative.    Current Medications, Allergies, Past Medical History, Past Surgical History, Family History and Social History were reviewed in [employer] electronic medical record  Vital signs were reviewed in today's medical record. Physical Exam: General: Well developed , well nourished, no acute distress

## 2013-05-01 NOTE — Assessment & Plan Note (Signed)
Patient is symptomatic again with probable achalasia.  I again discussed the various treatment options emphasized the need for formal esophageal manometry before proceeding further.  She is agreeable to this and will have it scheduled.

## 2013-05-04 ENCOUNTER — Telehealth: Payer: Self-pay | Admitting: Gastroenterology

## 2013-05-05 NOTE — Telephone Encounter (Signed)
Pts appt rescheduled to 05/15/13 at [facility] at 1pm. Pt has prep instructions and is aware of appt date and time.

## 2013-05-15 ENCOUNTER — Encounter (HOSPITAL_COMMUNITY)
Admission: RE | Disposition: A | Discharge status: Home / Self Care | Payer: Self-pay | Source: Ambulatory Visit | Attending: Gastroenterology

## 2013-05-15 ENCOUNTER — Ambulatory Visit (HOSPITAL_COMMUNITY)
Admission: RE | Admit: 2013-05-15 | Discharge: 2013-05-15 | Disposition: A | Discharge status: Home / Self Care | Payer: Medicare Other | Source: Ambulatory Visit | Attending: Gastroenterology | Admitting: Gastroenterology

## 2013-05-15 DIAGNOSIS — R1310 Dysphagia, unspecified: Secondary | ICD-10-CM | POA: Diagnosis not present

## 2013-05-15 HISTORY — PX: ESOPHAGEAL MANOMETRY: SHX5429

## 2013-05-15 SURGERY — MANOMETRY, ESOPHAGUS
Anesthesia: Topical

## 2013-05-15 MED ORDER — LIDOCAINE VISCOUS 2 % MT SOLN
OROMUCOSAL | Status: AC
  Filled 2013-05-15: qty 15

## 2013-05-15 SURGICAL SUPPLY — 4 items
DRAPE UTILITY 15X26 W/TAPE STR (DRAPE) ×2 IMPLANT
GLOVE BIOGEL PI IND STRL 8 (GLOVE) ×1 IMPLANT
GLOVE BIOGEL PI INDICATOR 8 (GLOVE) ×1
GOWN PREVENTION PLUS LG XLONG (DISPOSABLE) ×2 IMPLANT

## 2013-05-16 ENCOUNTER — Encounter (HOSPITAL_COMMUNITY): Payer: Self-pay | Admitting: Gastroenterology

## 2013-05-29 ENCOUNTER — Telehealth: Payer: Self-pay | Admitting: Gastroenterology

## 2013-05-29 NOTE — Telephone Encounter (Signed)
Pt is calling for results of esophageal manometry that was done 05/15/13. Please advise.

## 2013-05-30 NOTE — Telephone Encounter (Signed)
I remember reviewing the report but I cannot locate the actual report.  Please let me know where I can find it.

## 2013-05-30 NOTE — Telephone Encounter (Signed)
Please see the email regarding the mano.

## 2013-05-30 NOTE — Telephone Encounter (Signed)
Where is the email?

## 2013-05-30 NOTE — Telephone Encounter (Signed)
Manometry demonstrates changes classic for achalasia.  I like to discuss these findings further when she returns to the office.

## 2013-05-30 NOTE — Telephone Encounter (Signed)
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

## 2013-05-31 NOTE — Telephone Encounter (Signed)
Pt aware and scheduled to see Dr. Arlyce Dice 06/28/13@10 :30am. Pt aware of appt.

## 2013-06-02 DIAGNOSIS — E890 Postprocedural hypothyroidism: Secondary | ICD-10-CM | POA: Diagnosis not present

## 2013-06-02 DIAGNOSIS — E0500 Thyrotoxicosis with diffuse goiter without thyrotoxic crisis or storm: Secondary | ICD-10-CM | POA: Diagnosis not present

## 2013-06-05 ENCOUNTER — Encounter: Payer: Self-pay | Admitting: Gastroenterology

## 2013-06-05 DIAGNOSIS — Z23 Encounter for immunization: Secondary | ICD-10-CM | POA: Diagnosis not present

## 2013-06-05 DIAGNOSIS — E890 Postprocedural hypothyroidism: Secondary | ICD-10-CM | POA: Diagnosis not present

## 2013-06-05 DIAGNOSIS — E0500 Thyrotoxicosis with diffuse goiter without thyrotoxic crisis or storm: Secondary | ICD-10-CM | POA: Diagnosis not present

## 2013-06-28 ENCOUNTER — Ambulatory Visit: Payer: Medicare Other | Admitting: Gastroenterology

## 2013-07-05 ENCOUNTER — Ambulatory Visit: Payer: Medicare Other | Admitting: Gastroenterology

## 2013-07-11 DIAGNOSIS — F3189 Other bipolar disorder: Secondary | ICD-10-CM | POA: Diagnosis not present

## 2013-07-18 DIAGNOSIS — M47817 Spondylosis without myelopathy or radiculopathy, lumbosacral region: Secondary | ICD-10-CM | POA: Diagnosis not present

## 2013-07-18 DIAGNOSIS — G894 Chronic pain syndrome: Secondary | ICD-10-CM | POA: Diagnosis not present

## 2013-07-18 DIAGNOSIS — Z79899 Other long term (current) drug therapy: Secondary | ICD-10-CM | POA: Diagnosis not present

## 2013-07-18 DIAGNOSIS — M961 Postlaminectomy syndrome, not elsewhere classified: Secondary | ICD-10-CM | POA: Diagnosis not present

## 2013-07-18 DIAGNOSIS — M47812 Spondylosis without myelopathy or radiculopathy, cervical region: Secondary | ICD-10-CM | POA: Diagnosis not present

## 2013-08-08 DIAGNOSIS — G894 Chronic pain syndrome: Secondary | ICD-10-CM | POA: Diagnosis not present

## 2013-08-08 DIAGNOSIS — M47817 Spondylosis without myelopathy or radiculopathy, lumbosacral region: Secondary | ICD-10-CM | POA: Diagnosis not present

## 2013-08-08 DIAGNOSIS — Z79899 Other long term (current) drug therapy: Secondary | ICD-10-CM | POA: Diagnosis not present

## 2013-08-08 DIAGNOSIS — M961 Postlaminectomy syndrome, not elsewhere classified: Secondary | ICD-10-CM | POA: Diagnosis not present

## 2013-08-09 ENCOUNTER — Encounter: Payer: Self-pay | Admitting: Gastroenterology

## 2013-08-09 ENCOUNTER — Ambulatory Visit (INDEPENDENT_AMBULATORY_CARE_PROVIDER_SITE_OTHER): Payer: Medicare Other | Admitting: Gastroenterology

## 2013-08-09 VITALS — BP 148/70 | HR 100 | Ht 64.0 in | Wt 169.2 lb

## 2013-08-09 DIAGNOSIS — K220 Achalasia of cardia: Secondary | ICD-10-CM

## 2013-08-09 HISTORY — DX: Achalasia of cardia: K22.0

## 2013-08-09 NOTE — Assessment & Plan Note (Signed)
Patient has achalasia by signs, symptoms and manometry.  I again reviewed the treatments for achalasia including Botox injection (temporary), forceful balloon dilatation, and surgical myotomy.  The risks and benefits were discussed.  The patient would like to proceed with balloon dilatation.  I explained to her that I will have to locate and  review the manometry report before making a referral.

## 2013-08-09 NOTE — Progress Notes (Signed)
          History of Present Illness:  The patient has returned following esophageal manometry.  Unfortunately, I am unable to locate the actual report although there is a note written by myself at indicates findings are classic for achalasia.  She complains of dysphagia to solids and liquids.  Last Botox injection was about 15 months ago.    Review of Systems: Pertinent positive and negative review of systems were noted in the above HPI section. All other review of systems were otherwise negative.    Current Medications, Allergies, Past Medical History, Past Surgical History, Family History and Social History were reviewed in [employer] electronic medical record  Vital signs were reviewed in today's medical record. Physical Exam: General: Well developed , well nourished, no acute distress  Neurological: Alert oriented x 4, grossly nonfocal Psychological:  Alert and cooperative. Normal mood and affect  See Assessment and Plan under Problem List

## 2013-08-09 NOTE — Patient Instructions (Signed)
We will contact you as soon as we locate the [facility] study

## 2013-08-14 DIAGNOSIS — D518 Other vitamin B12 deficiency anemias: Secondary | ICD-10-CM | POA: Diagnosis not present

## 2013-09-05 ENCOUNTER — Telehealth: Payer: Self-pay | Admitting: Gastroenterology

## 2013-09-05 NOTE — Telephone Encounter (Signed)
Dr Arlyce DiceKaplan?? Not sure what Botox injections she is talking about?

## 2013-09-05 NOTE — Telephone Encounter (Signed)
I finally located the manometry.  I have asked Dr. Sharla KidneyPirtle to interpret it.  At her last office visit she indicated that she wanted balloon dilation therapy.  If so I will have to refer her.  If she wants Botox therapy then we can proceed.

## 2013-09-05 NOTE — Telephone Encounter (Signed)
Jillian BallRobin do you know anything about her getting botox?

## 2013-09-06 ENCOUNTER — Encounter: Payer: Self-pay | Admitting: Gastroenterology

## 2013-09-06 DIAGNOSIS — M961 Postlaminectomy syndrome, not elsewhere classified: Secondary | ICD-10-CM | POA: Diagnosis not present

## 2013-09-06 DIAGNOSIS — M47817 Spondylosis without myelopathy or radiculopathy, lumbosacral region: Secondary | ICD-10-CM | POA: Diagnosis not present

## 2013-09-06 DIAGNOSIS — G894 Chronic pain syndrome: Secondary | ICD-10-CM | POA: Diagnosis not present

## 2013-09-06 DIAGNOSIS — Z79899 Other long term (current) drug therapy: Secondary | ICD-10-CM | POA: Diagnosis not present

## 2013-09-06 NOTE — Telephone Encounter (Signed)
Left message for pt to call back  °

## 2013-09-08 ENCOUNTER — Telehealth: Payer: Self-pay

## 2013-09-08 ENCOUNTER — Other Ambulatory Visit: Payer: Self-pay

## 2013-09-08 DIAGNOSIS — K220 Achalasia of cardia: Secondary | ICD-10-CM

## 2013-09-08 NOTE — Telephone Encounter (Signed)
Pt scheduled [facility]. Pt to arrive there at 9am, procedure at 10am. Pt to be NPO after midnight. Pt aware of appt date and time. Instructions mailed to patient.

## 2013-09-08 NOTE — Telephone Encounter (Signed)
Message copied by Chrystie NoseHUNT, LINDA R on Fri Sep 08, 2013  3:18 PM ------      Message from: Melvia HeapsKAPLAN, ROBERT D      Created: Fri Sep 08, 2013  3:00 PM      Regarding: RE: EGD with Botox       Would like to see her in the office before we do the procedure      ----- Message -----         From: Lily LovingsLinda Ray Hunt, RN         Sent: 09/08/2013   2:34 PM           To: Louis Meckelobert D Kaplan, MD, Marlowe Kaysobin M Stallings, CMA      Subject: EGD with Botox                                           [facility] 10/09/13@10am        ------

## 2013-09-08 NOTE — Telephone Encounter (Signed)
Pt scheduled for OV 09/14/13@10 :30am. Pt aware of appt date and time.

## 2013-09-12 ENCOUNTER — Telehealth: Payer: Self-pay | Admitting: Gastroenterology

## 2013-09-13 DIAGNOSIS — M47817 Spondylosis without myelopathy or radiculopathy, lumbosacral region: Secondary | ICD-10-CM | POA: Diagnosis not present

## 2013-09-13 NOTE — Telephone Encounter (Signed)
lmom for pt to call back

## 2013-09-14 ENCOUNTER — Ambulatory Visit (INDEPENDENT_AMBULATORY_CARE_PROVIDER_SITE_OTHER): Payer: Medicare Other | Admitting: Gastroenterology

## 2013-09-14 ENCOUNTER — Encounter: Payer: Self-pay | Admitting: Gastroenterology

## 2013-09-14 ENCOUNTER — Telehealth: Payer: Self-pay | Admitting: *Deleted

## 2013-09-14 VITALS — BP 144/60 | HR 100 | Ht 63.0 in | Wt 167.2 lb

## 2013-09-14 DIAGNOSIS — K220 Achalasia of cardia: Secondary | ICD-10-CM

## 2013-09-14 NOTE — Telephone Encounter (Signed)
We talked about this at her office visit. She decided on forceful balloon dilitation for which [facility] is [facility] in lieu of having  endoscopy with botox.  Please tell her that there was missed communication between you and me because I assumed you had canceled the procedure

## 2013-09-14 NOTE — Assessment & Plan Note (Signed)
I again discussed at length the options for treating achalasia including forceful balloon dilation, surgical myotomy, Botox injection, and POEM (per oral endoscopic myotomy).  I explained that I would consider more definitive therapy than Botox since that is only short-lived.  She would like to proceed with balloon dilation.  Accordingly, I will refer her to [facility] to see Dr. Corky MullMike Fina.

## 2013-09-14 NOTE — Progress Notes (Signed)
          History of Present Illness:  The patient has returned for followup of achalasia.  Esophageal manometry is diagnostic for achalasia.  She continues to complain of dysphagia to solids and liquids.    Review of Systems: Pertinent positive and negative review of systems were noted in the above HPI section. All other review of systems were otherwise negative.    Current Medications, Allergies, Past Medical History, Past Surgical History, Family History and Social History were reviewed in [facility] electronic medical record  Vital signs were reviewed in today's medical record. Physical Exam: General: Well developed , well nourished, no acute distress   See Assessment and Plan under Problem List

## 2013-09-14 NOTE — Telephone Encounter (Signed)
She is ok now. I spoke with her again and told her , her appointment time with Dr Merri BrunetteFina. She seemed happy with that

## 2013-09-14 NOTE — Telephone Encounter (Signed)
FYI-Dr Jillian Morris, This patient called upset today stating she didn't know she had a procedure scheduled [facility]  She said she wanted it cancelled and not rescheduled.   It is documented where Jillian Morris spoke with her and explained to her she was having a procedure  She thinks her entire chart is mixed up with another Jillian Morris.

## 2013-09-14 NOTE — Telephone Encounter (Signed)
Dr Arlyce DiceKaplan I just seen your offive visit note that said to refer her, you didn't mention it in clinic. But I have gotten her in to see Dr Corky MullMike Fina [facility] for Next Tues. Im going to attempt to contact the pt to inform her

## 2013-09-28 DIAGNOSIS — M47817 Spondylosis without myelopathy or radiculopathy, lumbosacral region: Secondary | ICD-10-CM | POA: Diagnosis not present

## 2013-10-02 ENCOUNTER — Encounter: Payer: Medicare Other | Admitting: Gastroenterology

## 2013-10-03 ENCOUNTER — Other Ambulatory Visit: Payer: Self-pay

## 2013-10-03 DIAGNOSIS — Z1231 Encounter for screening mammogram for malignant neoplasm of breast: Secondary | ICD-10-CM

## 2013-10-04 DIAGNOSIS — M47817 Spondylosis without myelopathy or radiculopathy, lumbosacral region: Secondary | ICD-10-CM | POA: Diagnosis not present

## 2013-10-04 DIAGNOSIS — M25559 Pain in unspecified hip: Secondary | ICD-10-CM | POA: Diagnosis not present

## 2013-10-04 DIAGNOSIS — Z79899 Other long term (current) drug therapy: Secondary | ICD-10-CM | POA: Diagnosis not present

## 2013-10-04 DIAGNOSIS — M961 Postlaminectomy syndrome, not elsewhere classified: Secondary | ICD-10-CM | POA: Diagnosis not present

## 2013-10-04 NOTE — Telephone Encounter (Signed)
lmom for pt to call back if she still has questions. She has cancelled her procedure .

## 2013-10-09 ENCOUNTER — Encounter (HOSPITAL_COMMUNITY): Admission: RE | Payer: Self-pay | Source: Ambulatory Visit

## 2013-10-09 ENCOUNTER — Ambulatory Visit (HOSPITAL_COMMUNITY): Admission: RE | Admit: 2013-10-09 | Payer: Medicare Other | Source: Ambulatory Visit | Admitting: Gastroenterology

## 2013-10-09 SURGERY — EGD (ESOPHAGOGASTRODUODENOSCOPY)
Anesthesia: Moderate Sedation

## 2013-10-25 ENCOUNTER — Ambulatory Visit: Payer: Medicare Other

## 2013-11-01 DIAGNOSIS — G894 Chronic pain syndrome: Secondary | ICD-10-CM | POA: Diagnosis not present

## 2013-11-01 DIAGNOSIS — Z79899 Other long term (current) drug therapy: Secondary | ICD-10-CM | POA: Diagnosis not present

## 2013-11-01 DIAGNOSIS — M47817 Spondylosis without myelopathy or radiculopathy, lumbosacral region: Secondary | ICD-10-CM | POA: Diagnosis not present

## 2013-11-01 DIAGNOSIS — M25559 Pain in unspecified hip: Secondary | ICD-10-CM | POA: Diagnosis not present

## 2013-11-08 DIAGNOSIS — K220 Achalasia of cardia: Secondary | ICD-10-CM | POA: Diagnosis not present

## 2013-11-15 DIAGNOSIS — R21 Rash and other nonspecific skin eruption: Secondary | ICD-10-CM | POA: Diagnosis not present

## 2013-11-15 DIAGNOSIS — D518 Other vitamin B12 deficiency anemias: Secondary | ICD-10-CM | POA: Diagnosis not present

## 2013-11-15 DIAGNOSIS — M545 Low back pain, unspecified: Secondary | ICD-10-CM | POA: Diagnosis not present

## 2013-11-15 DIAGNOSIS — M25559 Pain in unspecified hip: Secondary | ICD-10-CM | POA: Diagnosis not present

## 2013-11-15 DIAGNOSIS — N183 Chronic kidney disease, stage 3 unspecified: Secondary | ICD-10-CM | POA: Diagnosis not present

## 2013-11-15 DIAGNOSIS — E039 Hypothyroidism, unspecified: Secondary | ICD-10-CM | POA: Diagnosis not present

## 2013-11-16 DIAGNOSIS — K220 Achalasia of cardia: Secondary | ICD-10-CM | POA: Diagnosis not present

## 2013-11-16 DIAGNOSIS — K449 Diaphragmatic hernia without obstruction or gangrene: Secondary | ICD-10-CM | POA: Diagnosis not present

## 2013-11-21 ENCOUNTER — Telehealth: Payer: Self-pay | Admitting: *Deleted

## 2013-11-21 DIAGNOSIS — M76899 Other specified enthesopathies of unspecified lower limb, excluding foot: Secondary | ICD-10-CM | POA: Diagnosis not present

## 2013-11-21 MED ORDER — DEXLANSOPRAZOLE 60 MG PO CPDR: DELAYED_RELEASE_CAPSULE | ORAL | Status: DC

## 2013-11-21 NOTE — Telephone Encounter (Signed)
Med sent to [facility]  per fax request

## 2013-12-11 DIAGNOSIS — J40 Bronchitis, not specified as acute or chronic: Secondary | ICD-10-CM | POA: Diagnosis not present

## 2013-12-22 DIAGNOSIS — D510 Vitamin B12 deficiency anemia due to intrinsic factor deficiency: Secondary | ICD-10-CM | POA: Diagnosis not present

## 2013-12-26 DIAGNOSIS — F3189 Other bipolar disorder: Secondary | ICD-10-CM | POA: Diagnosis not present

## 2014-01-12 ENCOUNTER — Telehealth: Payer: Self-pay | Admitting: Gastroenterology

## 2014-01-16 MED ORDER — OMEPRAZOLE 40 MG PO CPDR: 40.0000 mg | DELAYED_RELEASE_CAPSULE | Freq: Every day | ORAL | Status: DC

## 2014-01-16 NOTE — Telephone Encounter (Signed)
Med sent in for pt. She requested omeprazole 90 day

## 2014-01-17 DIAGNOSIS — M47817 Spondylosis without myelopathy or radiculopathy, lumbosacral region: Secondary | ICD-10-CM | POA: Diagnosis not present

## 2014-01-17 DIAGNOSIS — Z79899 Other long term (current) drug therapy: Secondary | ICD-10-CM | POA: Diagnosis not present

## 2014-01-17 DIAGNOSIS — G894 Chronic pain syndrome: Secondary | ICD-10-CM | POA: Diagnosis not present

## 2014-01-17 DIAGNOSIS — M961 Postlaminectomy syndrome, not elsewhere classified: Secondary | ICD-10-CM | POA: Diagnosis not present

## 2014-02-14 DIAGNOSIS — M47817 Spondylosis without myelopathy or radiculopathy, lumbosacral region: Secondary | ICD-10-CM | POA: Diagnosis not present

## 2014-02-14 DIAGNOSIS — G894 Chronic pain syndrome: Secondary | ICD-10-CM | POA: Diagnosis not present

## 2014-02-14 DIAGNOSIS — M961 Postlaminectomy syndrome, not elsewhere classified: Secondary | ICD-10-CM | POA: Diagnosis not present

## 2014-02-14 DIAGNOSIS — Z79899 Other long term (current) drug therapy: Secondary | ICD-10-CM | POA: Diagnosis not present

## 2014-02-15 DIAGNOSIS — E538 Deficiency of other specified B group vitamins: Secondary | ICD-10-CM | POA: Diagnosis not present

## 2014-02-27 DIAGNOSIS — M47817 Spondylosis without myelopathy or radiculopathy, lumbosacral region: Secondary | ICD-10-CM | POA: Diagnosis not present

## 2014-03-13 DIAGNOSIS — M47817 Spondylosis without myelopathy or radiculopathy, lumbosacral region: Secondary | ICD-10-CM | POA: Diagnosis not present

## 2014-03-13 DIAGNOSIS — M961 Postlaminectomy syndrome, not elsewhere classified: Secondary | ICD-10-CM | POA: Diagnosis not present

## 2014-03-13 DIAGNOSIS — G894 Chronic pain syndrome: Secondary | ICD-10-CM | POA: Diagnosis not present

## 2014-03-13 DIAGNOSIS — Z79899 Other long term (current) drug therapy: Secondary | ICD-10-CM | POA: Diagnosis not present

## 2014-04-12 DIAGNOSIS — M961 Postlaminectomy syndrome, not elsewhere classified: Secondary | ICD-10-CM | POA: Diagnosis not present

## 2014-04-12 DIAGNOSIS — M47817 Spondylosis without myelopathy or radiculopathy, lumbosacral region: Secondary | ICD-10-CM | POA: Diagnosis not present

## 2014-04-12 DIAGNOSIS — G894 Chronic pain syndrome: Secondary | ICD-10-CM | POA: Diagnosis not present

## 2014-04-23 DIAGNOSIS — G894 Chronic pain syndrome: Secondary | ICD-10-CM | POA: Diagnosis not present

## 2014-04-23 DIAGNOSIS — Z79899 Other long term (current) drug therapy: Secondary | ICD-10-CM | POA: Diagnosis not present

## 2014-04-23 DIAGNOSIS — M961 Postlaminectomy syndrome, not elsewhere classified: Secondary | ICD-10-CM | POA: Diagnosis not present

## 2014-05-18 DIAGNOSIS — R413 Other amnesia: Secondary | ICD-10-CM | POA: Diagnosis not present

## 2014-05-18 DIAGNOSIS — R269 Unspecified abnormalities of gait and mobility: Secondary | ICD-10-CM | POA: Diagnosis not present

## 2014-05-21 DIAGNOSIS — Z79899 Other long term (current) drug therapy: Secondary | ICD-10-CM | POA: Diagnosis not present

## 2014-05-21 DIAGNOSIS — M961 Postlaminectomy syndrome, not elsewhere classified: Secondary | ICD-10-CM | POA: Diagnosis not present

## 2014-05-21 DIAGNOSIS — G894 Chronic pain syndrome: Secondary | ICD-10-CM | POA: Diagnosis not present

## 2014-05-23 DIAGNOSIS — R413 Other amnesia: Secondary | ICD-10-CM | POA: Diagnosis not present

## 2014-05-23 DIAGNOSIS — G319 Degenerative disease of nervous system, unspecified: Secondary | ICD-10-CM | POA: Diagnosis not present

## 2014-05-23 DIAGNOSIS — R269 Unspecified abnormalities of gait and mobility: Secondary | ICD-10-CM | POA: Diagnosis not present

## 2014-06-04 ENCOUNTER — Telehealth: Payer: Self-pay | Admitting: Neurology

## 2014-06-04 ENCOUNTER — Institutional Professional Consult (permissible substitution): Payer: Medicare Other | Admitting: Neurology

## 2014-06-04 NOTE — Telephone Encounter (Signed)
Patient no showed for an appointment today, 06/04/2014, at 0930.

## 2014-06-06 ENCOUNTER — Ambulatory Visit (INDEPENDENT_AMBULATORY_CARE_PROVIDER_SITE_OTHER): Payer: Medicare Other | Admitting: Neurology

## 2014-06-06 ENCOUNTER — Encounter: Payer: Self-pay | Admitting: Neurology

## 2014-06-06 VITALS — BP 137/85 | HR 90 | Resp 16 | Ht 63.25 in | Wt 173.0 lb

## 2014-06-06 DIAGNOSIS — R413 Other amnesia: Secondary | ICD-10-CM

## 2014-06-06 DIAGNOSIS — R2689 Other abnormalities of gait and mobility: Secondary | ICD-10-CM

## 2014-06-06 DIAGNOSIS — R29818 Other symptoms and signs involving the nervous system: Secondary | ICD-10-CM

## 2014-06-06 NOTE — Patient Instructions (Signed)
Please continue with B12 shots through Dr. Tiburcio PeaHarris. Talk to Dr. Tiburcio PeaHarris about doing physical therapy again.   I do not think we need to add more tests today. Your memory score is good today. I can see you back as needed.

## 2014-06-06 NOTE — Progress Notes (Signed)
Subjective:    Patient ID: Jillian Morris is a 68 y.o. female.  HPI    Huston Foley, MD, PhD [facility] [address], [address] [address] [place], [place] [address]  Dear Dr. Tiburcio Pea,   I saw your patient, Jillian Morris, upon your kind request, in my neurologic clinic today for initial consultation of her balance problems and memory loss. The patient is accompanied by her husband today. She did not come to her appointment on 06/04/14 d/t a family emergency. As you know, Jillian Morris is a 68 year-old right-handed woman with an underlying complex medical history of IBS (irritable bowel syndrome), hypothyroidism, chronic fatigue, FMS, meralgia paraesthetica, chronic low back pain, anemia and B12 deficiency, secondary to gastric bypass surgery with malabsorption, eczema, rosacea, anxiety, arthritis, depression, dysphagia, secondary to esophageal stricture (status post dilation of esophageal narrowing), GERD, stroke, Sjogren's syndrome, Graves disease and lymphedema, who reports a longstanding history of balance issues and memory issues. She feels her short-term memory has suffered. She feels her condition has progressed. She does report having fallen in the past. She had physical therapy in the past when she had her knee replacement surgery, but not more recently. Her husband notes that she has missed a couple of B12 injection appointments as I understand.  She had a CT head with without contrast on 08/19/2012: No acute intracranial abnormality. Stable mild atrophy and chronic microvascular change.  She had her lumbar spine MRI without contrast on 07/24/2009: 1. Little interval change from prior exam with slight progression of left foraminal stenosis at L3-L4 compared to the prior exam of 2009. 2. Laminectomy and posterior decompression extending from L3-L4 through L5-S1. No recurrent central or lateral recess stenosis. Foraminal stenosis as described above. 3. In this patient  with right radicular symptoms, question right L4 radiculitis associated with compression in the right L4-L5 neural foramen. She is a brain MRI with and without contrast and a brain MRA without contrast on 03/16/2002 ordered by Dr. Sandria Manly with the indication of confusion, dizziness, syncope and fainting: These were reported as normal. She had previously seen Dr. Sandria Manly in our office, last in 2009. I reviewed the office note from 07/26/2008, at which time she presented for balance problems and falls. In July and August 2008 she underwent neuropsychological evaluation for complaints of memory problems and test results were in keeping with normal intellectual functioning. Dr. Sandria Manly started her on aspirin. An MMSE score was 30 out of 30 at the time. She had seen Dr. Sandria Manly back in June 2008 for balance problems and falls. Her MMSE was 30 out of 30 at the time, clock drawing was 4 out of 4. He did not notice any significant gait disorder at the time and suspected that in view of her history of gastric bypass, peripheral neuropathy should be suspected and he ordered an EMG and nerve conduction test as well as EEG. He check TSH, RPR and vitamin B12 level as well as methylmalonic acid. She had an EMG and nerve conduction test on 02/10/2007 which I reviewed. This was done by Dr. 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 She had an EEG on 02/10/2007 which was interpreted as normal EEG during the awake and drowsy states. MRI of the cervical spine from 03/17/2007 showed prominent spondylolytic changes throughout most noticeable at C5-6 and C6-7 with mild canal stenosis at C5-6 with no significant foraminal  stenosis, root encroachment or cord signal abnormality.   Her Past Medical History Is Significant For: Past Medical History  Diagnosis Date  . IBS (irritable bowel syndrome)   .  Hypothyroidism   . Chronic fatigue fibromyalgia syndrome   . Meralgia paraesthetica   . Chronic low back pain   . Anemia     s/p gastric bypass, malabsorption  . B12 deficiency   . Acne rosacea   . Anxiety   . Arthritis   . Depression   . Status post dilation of esophageal narrowing   . GERD (gastroesophageal reflux disease)   . Stroke   . Sjogren's syndrome   . Graves disease   . Lymphedema   . H/O hiatal hernia   . Fibromyalgia     Her Past Surgical History Is Significant For: Past Surgical History  Procedure Laterality Date  . Gastric bypass    . Laminectomy      L3-L5  . Replacement total knee  01/2011    right  . Appendectomy    . Thyroidectomy    . Abdominal hysterectomy    . Endometrial ablation      x 4  . Esophagogastroduodenoscopy  03/30/2012    Procedure: ESOPHAGOGASTRODUODENOSCOPY (EGD);  Surgeon: Louis Meckel, MD;  Location: [facility];  Service: Endoscopy;  Laterality: N/A;  . Balloon dilation  03/30/2012    Procedure: BALLOON DILATION;  Surgeon: Louis Meckel, MD;  Location: [facility];  Service: Endoscopy;  Laterality: N/A;  . Esophagogastroduodenoscopy  05/04/2012    Procedure: ESOPHAGOGASTRODUODENOSCOPY (EGD);  Surgeon: Louis Meckel, MD;  Location: [facility];  Service: Endoscopy;  Laterality: N/A;  . Esophageal manometry N/A 05/15/2013    Procedure: ESOPHAGEAL MANOMETRY (EM);  Surgeon: Louis Meckel, MD;  Location: [facility];  Service: Endoscopy;  Laterality: N/A;    Her Family History Is Significant For: Family History  Problem Relation Age of Onset  . Prostate cancer Maternal Grandfather   . Lymphoma Maternal Uncle   . Heart disease Father   . Heart disease Sister   . Irritable bowel syndrome Sister   . Dementia Mother   . Stroke Maternal Grandmother   . Appendicitis Paternal Grandmother     Her Social History Is Significant For: History   Social History  . Marital Status: Married    Spouse Name: N/A    Number of  Children: 0  . Years of Education: N/A   Occupational History  . housewife    Social History Main Topics  . Smoking status: Never Smoker   . Smokeless tobacco: Never Used  . Alcohol Use: No  . Drug Use: No  . Sexual Activity: None   Other Topics Concern  . None   Social History Narrative   Right handed, Married, Step kids 2, Caffeine 4-5 cups daily, graduate school    Her Allergies Are:  Allergies  Allergen Reactions  . Actonel [Risedronate Sodium]     GI Upset  . Ambien [Zolpidem Tartrate]   . Augmentin [Amoxicillin-Pot Clavulanate]   . Depakote [Divalproex Sodium]     Hallucinations  . Inderal [Propranolol]   . Latex   . Nickel   . Nsaids Other (See Comments)    Pt. Had gastric bypass surgery   . Penicillins   . Silicon Other (See Comments)  :   Her Current Medications Are:  Outpatient Encounter Prescriptions as of 06/06/2014  Medication Sig  . cyanocobalamin (,VITAMIN B-12,) 1000 MCG/ML injection Inject 1,000 mcg into the muscle every  30 (thirty) days.  . cyclobenzaprine (FLEXERIL) 10 MG tablet Take 10 mg by mouth 3 (three) times daily as needed.  Marland Kitchen dexlansoprazole (DEXILANT) 60 MG capsule TAKE 1 CAPSULE (60 MG TOTAL) BY MOUTH DAILY.  Marland Kitchen dicyclomine (BENTYL) 10 MG capsule Take 10 mg by mouth 4 (four) times daily as needed.  . DULoxetine (CYMBALTA) 30 MG capsule Take 90 mg by mouth daily.  . hydrocortisone valerate ointment (WEST-CORT) 0.2 % Apply 1 application topically 2 (two) times daily as needed.   . LamoTRIgine (LAMICTAL ODT) 50 MG TBDP Take 1 tablet by mouth 3 (three) times daily.  . Levothyroxine Sodium (TIROSINT PO) Take by mouth.  . lidocaine (LIDODERM) 5 % Place 1 patch onto the skin daily. Remove & Discard patch within 12 hours or as directed by MD  . LORazepam (ATIVAN) 1 MG tablet Take 1 mg by mouth 3 (three) times daily as needed.  . metroNIDAZOLE (METROCREAM) 0.75 % cream   . mupirocin ointment (BACTROBAN) 2 % Apply 1 application topically 3 (three)  times daily.  Marland Kitchen omeprazole (PRILOSEC) 40 MG capsule Take 1 capsule (40 mg total) by mouth daily.  Marland Kitchen oxymorphone (OPANA ER) 30 MG 12 hr tablet Take 30 mg by mouth every 12 (twelve) hours.  Marland Kitchen oxymorphone (OPANA) 10 MG tablet Take 10 mg by mouth every 4 (four) hours as needed.  . triamterene-hydrochlorothiazide (MAXZIDE-25) 37.5-25 MG per tablet Take 1 tablet by mouth daily.  . [DISCONTINUED] cyanocobalamin (,VITAMIN B-12,) 1000 MCG/ML injection Inject 1,000 mcg into the muscle.  . [DISCONTINUED] hydrocortisone valerate ointment (WEST-CORT) 0.2 % Apply topically.  . [DISCONTINUED] levothyroxine (SYNTHROID, LEVOTHROID) 125 MCG tablet Take 125 mcg by mouth daily before breakfast.  :   Review of Systems:  Out of a complete 14 point review of systems, all are reviewed and negative with the exception of these symptoms as listed below:  Review of Systems  Constitutional: Positive for fatigue.  HENT:       Tinnitus  Eyes:       Blurred vison  Musculoskeletal:       Joint pain, joint swelling  Allergic/Immunologic:       Allergies, skin sensitivity  Neurological:       Difficulty swallowing, RL  Psychiatric/Behavioral:       Depressioon, anxiety, not enough sleep    Objective:  Neurologic Exam  Physical Exam Physical Examination:   Filed Vitals:   06/06/14 1044  BP: 137/85  Pulse: 90  Resp: 16    General Examination: The patient is a very pleasant 68 y.o. female in no acute distress. She appears well-developed and well-nourished and very well groomed. She is pleasant and conversant.  HEENT: Normocephalic, atraumatic, pupils are equal, round and reactive to light and accommodation. She has bilateral cataracts. Funduscopic exam is normal with sharp disc margins noted. Extraocular tracking is good without limitation to gaze excursion or nystagmus noted. Normal smooth pursuit is noted. Hearing is grossly intact. Tympanic membranes are clear bilaterally. Face is symmetric with normal  facial animation and normal facial sensation. Speech is clear with no dysarthria noted. There is no hypophonia. There is no lip, neck/head, jaw or voice tremor. Neck is supple with full range of passive and active motion. There are no carotid bruits on auscultation. Oropharynx exam reveals: moderate mouth dryness, adequate dental hygiene and no significant airway tightness noted. Mallampati is class II.  Chest: Clear to auscultation without wheezing, rhonchi or crackles noted.  Heart: S1+S2+0, regular and normal without murmurs, rubs or  gallops noted.   Abdomen: Soft, non-tender and non-distended with normal bowel sounds appreciated on auscultation.  Extremities: There is nonpitting puffiness of her distal legs bilaterally. Pedal pulses are intact.  Skin: Warm and dry without trophic changes noted. There are no varicose veins.  Musculoskeletal: exam reveals no obvious joint deformities, tenderness or joint swelling or erythema.   Neurologically:  Mental status: The patient is awake, alert and oriented in all 4 spheres. Her immediate and remote memory, attention, language skills and fund of knowledge are very mildly impaired. She has some hesitation in answering questions. There is no overt evidence of aphasia, agnosia, apraxia or anomia. Her MMSE is 28/30. There is very mild slowness in thinking noted. Speech shows no hypophonia with normal prosody and enunciation. Thought process is linear. Mood is constricted and affect is blunted.  Cranial nerves II - XII are as described above under HEENT exam. In addition: shoulder shrug is normal with equal shoulder height noted. Motor exam: Normal bulk, strength and tone is noted. There is no drift, tremor or rebound. Romberg is negative. Reflexes are 2+ throughout. Babinski: Toes are flexor bilaterally. Fine motor skills and coordination: intact with normal finger taps, normal hand movements, normal rapid alternating patting, normal foot taps and normal  foot agility.  Cerebellar testing: No dysmetria or intention tremor on finger to nose testing. Heel to shin is unremarkable bilaterally. There is no truncal or gait ataxia.  Sensory exam: intact to light touch, pinprick, vibration, temperature sense in the upper and lower extremities with the exception of mild decrease in pinprick sensation and temperature sensation in both feet.  Gait, station and balance: She stands with difficulty. No veering to one side is noted. No leaning to one side is noted. Posture is age-appropriate and stance is slightly wide-based. She walks slowly with her cane. She turns in 3 steps she walks insecurely. Tandem walk is not possible.   Assessment and Plan:   In summary, Jillian Morris is a very pleasant 68 y.o.-year old female with an underlying complex medical history of IBS (irritable bowel syndrome), hypothyroidism, chronic fatigue, FMS, meralgia paraesthetica, chronic low back pain, anemia and B12 deficiency, secondary to gastric bypass surgery with malabsorption, eczema, rosacea, anxiety, arthritis, depression, dysphagia, secondary to esophageal stricture (status post dilation of esophageal narrowing), GERD, stroke, Sjogren's syndrome, Graves disease and lymphedema, who reports a longstanding history of balance issues and memory issues.  She had seen Dr. Sandria ManlyLove several years ago with extensive workup. I explained to the patient and her husband that her memory issues and balance issues are most likely not rooted in 1 single problem. Contributing factors would be degenerative spine disease, normal aging, B12 deficiency, dehydration, sedating medications such as narcotic medication, arthritis such as her knee arthritis. I believe that she has a multifactorial gait disorder and memory issues secondary to the above. She is encouraged to be regular with her B12 injections and not miss any doses and follow with you for regular checkup for her B12 levels. She is furthermore advised  to be better hydrated and use her cane at all times. She is encouraged to discuss with you to go back to physical therapy. She says she had a head CT recently but I did not see the results. She is encouraged to followup with your office regarding the CT head results. I am not sure that I can add a whole lot in her case and I advised her that I do not think we need to add  more testing from my end of things at this time. Given that she has had memory and balance issues for at least 7-8 years, I believe she has done fairly well given that her MMSE score was 28/30 today and she walked with the help of a cane. I reassured her in that regard and answered all her questions today. I suggested that she followup with her pain management doctor and you as previously scheduled. I will see her back as needed. The patient and her husband were in agreement.   Thank you very much for allowing me to participate in the care of this nice patient. If I can be of any further assistance to you please do not hesitate to call me at [phone].  Sincerely,   Huston FoleySaima Tiphanie Vo, MD, PhD

## 2014-06-12 DIAGNOSIS — G5731 Lesion of lateral popliteal nerve, right lower limb: Secondary | ICD-10-CM | POA: Diagnosis not present

## 2014-06-12 DIAGNOSIS — F3181 Bipolar II disorder: Secondary | ICD-10-CM | POA: Diagnosis not present

## 2014-06-12 DIAGNOSIS — M5416 Radiculopathy, lumbar region: Secondary | ICD-10-CM | POA: Diagnosis not present

## 2014-07-03 DIAGNOSIS — E538 Deficiency of other specified B group vitamins: Secondary | ICD-10-CM | POA: Diagnosis not present

## 2014-07-03 DIAGNOSIS — Z23 Encounter for immunization: Secondary | ICD-10-CM | POA: Diagnosis not present

## 2014-07-13 DIAGNOSIS — Z79891 Long term (current) use of opiate analgesic: Secondary | ICD-10-CM | POA: Diagnosis not present

## 2014-07-13 DIAGNOSIS — M961 Postlaminectomy syndrome, not elsewhere classified: Secondary | ICD-10-CM | POA: Diagnosis not present

## 2014-07-13 DIAGNOSIS — G894 Chronic pain syndrome: Secondary | ICD-10-CM | POA: Diagnosis not present

## 2014-07-13 DIAGNOSIS — M47816 Spondylosis without myelopathy or radiculopathy, lumbar region: Secondary | ICD-10-CM | POA: Diagnosis not present

## 2014-07-16 DIAGNOSIS — D519 Vitamin B12 deficiency anemia, unspecified: Secondary | ICD-10-CM | POA: Diagnosis not present

## 2014-07-16 DIAGNOSIS — R2681 Unsteadiness on feet: Secondary | ICD-10-CM | POA: Diagnosis not present

## 2014-07-16 DIAGNOSIS — F419 Anxiety disorder, unspecified: Secondary | ICD-10-CM | POA: Diagnosis not present

## 2014-07-16 DIAGNOSIS — L719 Rosacea, unspecified: Secondary | ICD-10-CM | POA: Diagnosis not present

## 2014-07-16 DIAGNOSIS — J309 Allergic rhinitis, unspecified: Secondary | ICD-10-CM | POA: Diagnosis not present

## 2014-07-16 DIAGNOSIS — M65341 Trigger finger, right ring finger: Secondary | ICD-10-CM | POA: Diagnosis not present

## 2014-07-16 DIAGNOSIS — N189 Chronic kidney disease, unspecified: Secondary | ICD-10-CM | POA: Diagnosis not present

## 2014-07-16 DIAGNOSIS — G43909 Migraine, unspecified, not intractable, without status migrainosus: Secondary | ICD-10-CM | POA: Diagnosis not present

## 2014-07-25 DIAGNOSIS — M65341 Trigger finger, right ring finger: Secondary | ICD-10-CM | POA: Diagnosis not present

## 2014-08-03 DIAGNOSIS — M65341 Trigger finger, right ring finger: Secondary | ICD-10-CM | POA: Diagnosis not present

## 2014-08-09 DIAGNOSIS — E039 Hypothyroidism, unspecified: Secondary | ICD-10-CM | POA: Diagnosis not present

## 2014-09-03 ENCOUNTER — Other Ambulatory Visit: Payer: Self-pay | Admitting: Family Medicine

## 2014-09-03 ENCOUNTER — Ambulatory Visit
Admission: RE | Admit: 2014-09-03 | Discharge: 2014-09-03 | Disposition: A | Discharge status: Home / Self Care | Payer: Medicare Other | Source: Ambulatory Visit | Attending: Family Medicine | Admitting: Family Medicine

## 2014-09-03 DIAGNOSIS — W102XXA Fall (on)(from) incline, initial encounter: Secondary | ICD-10-CM | POA: Diagnosis not present

## 2014-09-03 DIAGNOSIS — S0990XA Unspecified injury of head, initial encounter: Secondary | ICD-10-CM

## 2014-09-03 DIAGNOSIS — R51 Headache: Secondary | ICD-10-CM

## 2014-09-03 DIAGNOSIS — R519 Headache, unspecified: Secondary | ICD-10-CM

## 2014-09-03 DIAGNOSIS — D519 Vitamin B12 deficiency anemia, unspecified: Secondary | ICD-10-CM | POA: Diagnosis not present

## 2014-09-07 DIAGNOSIS — Z79891 Long term (current) use of opiate analgesic: Secondary | ICD-10-CM | POA: Diagnosis not present

## 2014-09-07 DIAGNOSIS — G894 Chronic pain syndrome: Secondary | ICD-10-CM | POA: Diagnosis not present

## 2014-09-07 DIAGNOSIS — M47816 Spondylosis without myelopathy or radiculopathy, lumbar region: Secondary | ICD-10-CM | POA: Diagnosis not present

## 2014-09-07 DIAGNOSIS — M961 Postlaminectomy syndrome, not elsewhere classified: Secondary | ICD-10-CM | POA: Diagnosis not present

## 2014-09-10 ENCOUNTER — Telehealth: Payer: Self-pay | Admitting: Gastroenterology

## 2014-09-13 ENCOUNTER — Ambulatory Visit: Payer: Medicare Other | Admitting: Nurse Practitioner

## 2014-09-18 ENCOUNTER — Ambulatory Visit: Payer: Medicare Other | Admitting: Physician Assistant

## 2014-09-28 DIAGNOSIS — L219 Seborrheic dermatitis, unspecified: Secondary | ICD-10-CM | POA: Diagnosis not present

## 2014-09-28 DIAGNOSIS — Z1389 Encounter for screening for other disorder: Secondary | ICD-10-CM | POA: Diagnosis not present

## 2014-09-28 DIAGNOSIS — L723 Sebaceous cyst: Secondary | ICD-10-CM | POA: Diagnosis not present

## 2014-09-28 DIAGNOSIS — J069 Acute upper respiratory infection, unspecified: Secondary | ICD-10-CM | POA: Diagnosis not present

## 2014-09-28 DIAGNOSIS — M4800 Spinal stenosis, site unspecified: Secondary | ICD-10-CM | POA: Diagnosis not present

## 2014-10-09 ENCOUNTER — Ambulatory Visit: Payer: Medicare Other | Admitting: Physician Assistant

## 2014-10-15 ENCOUNTER — Ambulatory Visit
Admission: RE | Admit: 2014-10-15 | Discharge: 2014-10-15 | Disposition: A | Discharge status: Home / Self Care | Payer: Medicare Other | Source: Ambulatory Visit

## 2014-10-15 DIAGNOSIS — Z1231 Encounter for screening mammogram for malignant neoplasm of breast: Secondary | ICD-10-CM | POA: Diagnosis not present

## 2014-11-01 ENCOUNTER — Telehealth: Payer: Self-pay | Admitting: Gastroenterology

## 2014-11-01 NOTE — Telephone Encounter (Signed)
Patient states she has had difficulty in getting her food to go down. She can feel it lodge. She has vomited twice and feels it is because the food couldn't go anywhere. She saw Dr Merri BrunetteFina last year and had a swallowing test where she swallowed the marshmallow. He released her telling her she could swallow fine. She is having to use Gas-X daily for a burning sensation. She is s/p gastric bypass a number of years ago. Eats 3 meals daily. Appointment made on the day she requests.

## 2014-11-06 ENCOUNTER — Ambulatory Visit: Payer: Medicare Other | Admitting: Physician Assistant

## 2014-11-20 DIAGNOSIS — E039 Hypothyroidism, unspecified: Secondary | ICD-10-CM | POA: Diagnosis not present

## 2014-11-20 DIAGNOSIS — E538 Deficiency of other specified B group vitamins: Secondary | ICD-10-CM | POA: Diagnosis not present

## 2014-11-26 ENCOUNTER — Other Ambulatory Visit (HOSPITAL_COMMUNITY): Payer: Self-pay | Admitting: Physician Assistant

## 2014-11-26 ENCOUNTER — Ambulatory Visit (INDEPENDENT_AMBULATORY_CARE_PROVIDER_SITE_OTHER): Payer: Medicare Other | Admitting: Physician Assistant

## 2014-11-26 ENCOUNTER — Encounter: Payer: Self-pay | Admitting: Physician Assistant

## 2014-11-26 VITALS — BP 140/70 | HR 80 | Ht 63.0 in | Wt 185.0 lb

## 2014-11-26 DIAGNOSIS — R1310 Dysphagia, unspecified: Secondary | ICD-10-CM | POA: Diagnosis not present

## 2014-11-26 DIAGNOSIS — K220 Achalasia of cardia: Secondary | ICD-10-CM

## 2014-11-26 NOTE — Progress Notes (Signed)
Reviewed and agree with management.  I would add to your note that therapies for achalasia have been discussed and [facility] was [facility] for [facility] dilation [facility] D. Jasnoor Trussell, M.D., FACG  

## 2014-11-26 NOTE — Patient Instructions (Signed)
You have been scheduled for a modified barium swallow on 4/6/2016at 11:30am. Please arrive 15 minutes prior to your test for registration. [facility] for your appointment.  Should you need to cancel or reschedule your appointment, please contact [phone] [facility] D[facility]) or [phone] [facility](). _____________________________________________________________________ A Modified Barium Swallow Study, or MBS, is a special x-ray that is taken to check swallowing skills. It is carried out by a Marine scientistadiologist and a Warehouse managerpeech Language Pathologist (SLP). During this test, yourmouth, throat, and esophagus, a muscular tube which connects your mouth to your stomach, is checked. The test will help you, your doctor, and the SLP plan what types of foods and liquids are easier for you to swallow. The SLP will also identify positions and ways to help you swallow more easily and safely. What will happen during an MBS? You will be taken to an x-ray room and seated comfortably. You will be asked to swallow small amounts of food and liquid mixed with barium. Barium is a liquid or paste that allows images of your mouth, throat and esophagus to be seen on x-ray. The x-ray captures moving images of the food you are swallowing as it travels from your mouth through your throat and into your esophagus. This test helps identify whether food or liquid is entering your lungs (aspiration). The test also shows which part of your mouth or throat lacks strength or coordination to move the food or liquid in the right direction. This test typically takes 30 minutes to 1 hour to complete. _______________________________________________________________________

## 2014-11-26 NOTE — Progress Notes (Addendum)
Patient ID: Jillian Morris, female   DOB: 11/20/1945, 69 y.o.   MRN: [id number]     History of Present Illness: Jillian Morris  is a 69 year old female known to Dr. Arlyce DiceKaplan with a prior history of dysphagia, esophageal dysmotility, and achalasia. She was diagnosed with achalasia when she had a manometry [facility] in September 2014. It showed type II achalasia with LES being 87, INR P 50 and pan esophageal pressurization on manometry. She is status post Roux-en-Y gastric bypass in 2002. She has been evaluated by Dr. Arlyce DiceKaplan as well as Dr. Merri BrunetteFina at [facility] for dysphagia of many years duration to both solids and liquids. She has had multiple EGDs and has had dilatations. She had 2 Botox injections since 2013 for dysphagia and then manometry in September 2014(Achalasia treatment options had been reviewed with pt and [facility]). . Barium swallow in April 2013 did not show esophageal strictures and a 13 mm tablet passed freely into the stomach. EGD in September 2013 showed a stricture at the GE J which was treated with Botox and balloon dilation to 18 mm. Patient had relief of her symptoms for 5 or 6 months. And 15 and had a repeat esophagram that showed no narrowing but did show evidence of esophageal dysmotility.  She is here today because she states for the past several months she feels as if foods and liquids catch in the back of her throat and "go down the wrong pipe". She coughs and sputters with every meal and states she often chokes and starts to cough on her own saliva. She has a complex underlying medical history of IBS, hypothyroidism, chronic fatigue, FMS, neuralgia paresthetica, chronic low back pain, anemia and B-12 deficiency, eczema, rosacea, and Friday, arthritis, depression, dysphagia, GERD, stroke, Sjogren's syndrome, Graves' disease, lymphedema, as well as balance issues and memory issues.   Past Medical History  Diagnosis Date  . IBS (irritable  bowel syndrome)   . Hypothyroidism   . Chronic fatigue fibromyalgia syndrome   . Meralgia paraesthetica   . Chronic low back pain   . Anemia     s/p gastric bypass, malabsorption  . B12 deficiency   . Acne rosacea   . Anxiety   . Arthritis   . Depression   . Status post dilation of esophageal narrowing   . GERD (gastroesophageal reflux disease)   . Stroke   . Sjogren's syndrome   . Graves disease   . Lymphedema   . H/O hiatal hernia   . Fibromyalgia     Past Surgical History  Procedure Laterality Date  . Gastric bypass    . Laminectomy      L3-L5  . Replacement total knee  01/2011    right  . Appendectomy    . Thyroidectomy    . Abdominal hysterectomy    . Endometrial ablation      x 4  . Esophagogastroduodenoscopy  03/30/2012    Procedure: ESOPHAGOGASTRODUODENOSCOPY (EGD);  Surgeon: Louis Meckelobert D Kaplan, MD;  [facility];  Service: Endoscopy;  Laterality: N/A;  . Balloon dilation  03/30/2012    Procedure: BALLOON DILATION;  Surgeon: Louis Meckelobert D Kaplan, MD;  Location: [facility];  Service: Endoscopy;  Laterality: N/A;  . Esophagogastroduodenoscopy  05/04/2012    Procedure: ESOPHAGOGASTRODUODENOSCOPY (EGD);  Surgeon: Louis Meckelobert D Kaplan, MD;  [facility];  Service: Endoscopy;  Laterality: N/A;  . Esophageal manometry N/A 05/15/2013    Procedure: ESOPHAGEAL MANOMETRY (EM);  Surgeon: Louis Meckel, MD;  [facility];  Service: Endoscopy;  Laterality: N/A;   Family History  Problem Relation Age of Onset  . Prostate cancer Maternal Grandfather   . Lymphoma Maternal Uncle   . Heart disease Father   . Heart disease Sister   . Irritable bowel syndrome Sister   . Dementia Mother   . Stroke Maternal Grandmother   . Appendicitis Paternal Grandmother   . Colon cancer Neg Hx    History  Substance Use Topics  . Smoking status: Never Smoker   . Smokeless tobacco: Never Used  . Alcohol Use: No   Current Outpatient Prescriptions  Medication Sig Dispense  Refill  . cyanocobalamin (,VITAMIN B-12,) 1000 MCG/ML injection Inject 1,000 mcg into the muscle every 30 (thirty) days.    Marland Kitchen dexlansoprazole (DEXILANT) 60 MG capsule TAKE 1 CAPSULE (60 MG TOTAL) BY MOUTH DAILY. 90 capsule 3  . dicyclomine (BENTYL) 10 MG capsule Take 10 mg by mouth 4 (four) times daily as needed.    . DULoxetine (CYMBALTA) 30 MG capsule Take 90 mg by mouth daily.    . hydrocortisone valerate ointment (WEST-CORT) 0.2 % Apply 1 application topically 2 (two) times daily as needed.     . LamoTRIgine (LAMICTAL ODT) 50 MG TBDP Take 1 tablet by mouth 3 (three) times daily.    . Levothyroxine Sodium (TIROSINT PO) Take 100 mcg by mouth daily.     Marland Kitchen lidocaine (LIDODERM) 5 % Place 1 patch onto the skin daily. Remove & Discard patch within 12 hours or as directed by MD    . LORazepam (ATIVAN) 1 MG tablet Take 1 mg by mouth 3 (three) times daily as needed.    . metroNIDAZOLE (METROCREAM) 0.75 % cream     . mupirocin ointment (BACTROBAN) 2 % Apply 1 application topically 3 (three) times daily.    Marland Kitchen omeprazole (PRILOSEC) 40 MG capsule Take 1 capsule (40 mg total) by mouth daily. 90 capsule 3  . oxymorphone (OPANA ER) 30 MG 12 hr tablet Take 30 mg by mouth every 12 (twelve) hours.    Marland Kitchen oxymorphone (OPANA) 10 MG tablet Take 10 mg by mouth every 4 (four) hours as needed.     No current facility-administered medications for this visit.   Allergies  Allergen Reactions  . Actonel [Risedronate Sodium]     GI Upset  . Ambien [Zolpidem Tartrate]   . Augmentin [Amoxicillin-Pot Clavulanate]   . Depakote [Divalproex Sodium]     Hallucinations  . Inderal [Propranolol]   . Latex   . Nickel   . Nsaids Other (See Comments)    Pt. Had gastric bypass surgery   . Penicillins   . Silicon Other (See Comments)      Review of Systems: Gen: Denies any fever, chills, sweats, anorexia, fatigue, weakness, malaise, weight loss, and sleep disorder CV: Denies chest pain, angina, palpitations, syncope,  orthopnea, PND, peripheral edema, and claudication. Resp: Denies dyspnea at rest, dyspnea with exercise, cough, sputum, wheezing, coughing up blood, and pleurisy. GI: Denies vomiting blood, jaundice, and fecal incontinence.  Has difficultyn swallowing liquids and solids. GU : Denies urinary burning, blood in urine, urinary frequency, urinary hesitancy, nocturnal urination, and urinary incontinence. MS: Has chronic low back pain Derm: Denies rash, itching, dry skin, hives, moles, warts, or unhealing ulcers.  Psych: Has anxiety, depression and memory problems Heme: Denies bruising, bleeding, and enlarged lymph nodes. Neuro:  Has meralgia paresthetica   Physical Exam: General: Pleasant, well developed female in  no acute distress Head: Normocephalic and atraumatic Eyes:  sclerae anicteric, conjunctiva pink  Ears: Normal auditory acuity Lungs: Clear throughout to auscultation Heart: Regular rate and rhythm Abdomen: Soft, non distended, non-tender. No masses, no hepatomegaly. Normal bowel sounds Musculoskeletal: Symmetrical with no gross deformities  Extremities: No edema  Neurological: Alert oriented x 4, grossly nonfocal Psychological:  Alert and cooperative. Normal mood and affect  Assessment and Recommendations:  69 year old female with a long-standing history of GERD and achalasia now presenting with coughing and sputtering when eating solids or drinking liquids. Patient says the symptoms are different than what she has had in the past. She has days where it is difficult for her to swallow saliva because she starts to cough on it. Patient will be scheduled for a swallowing study with speech pathology to evaluate for possible transfer dysphagia. Will review results with Dr. Arlyce Dice and contact patient with further recommendations.       Jillian Morris, Moise Boring 11/26/2014,

## 2014-11-27 DIAGNOSIS — F3181 Bipolar II disorder: Secondary | ICD-10-CM | POA: Diagnosis not present

## 2014-11-28 ENCOUNTER — Other Ambulatory Visit (HOSPITAL_COMMUNITY): Payer: Medicare Other

## 2014-11-28 ENCOUNTER — Ambulatory Visit (HOSPITAL_COMMUNITY): Payer: Medicare Other

## 2014-11-29 NOTE — Telephone Encounter (Signed)
A user error has taken place.

## 2014-12-04 ENCOUNTER — Ambulatory Visit (HOSPITAL_COMMUNITY)
Admission: RE | Admit: 2014-12-04 | Discharge: 2014-12-04 | Disposition: A | Discharge status: Home / Self Care | Payer: Medicare Other | Source: Ambulatory Visit | Attending: Physician Assistant | Admitting: Physician Assistant

## 2014-12-04 DIAGNOSIS — R1310 Dysphagia, unspecified: Secondary | ICD-10-CM | POA: Insufficient documentation

## 2014-12-04 DIAGNOSIS — K220 Achalasia of cardia: Secondary | ICD-10-CM | POA: Diagnosis not present

## 2014-12-04 NOTE — Procedures (Signed)
Objective Swallowing Evaluation: Modified Barium Swallowing Study  Patient Details  Name: Jillian Morris MRN: [id number] Date of Birth: 07/10/1946  Today's Date: 12/04/2014 Time: SLP Start Time (ACUTE ONLY): 1117-SLP Stop Time (ACUTE ONLY): 1144 SLP Time Calculation (min) (ACUTE ONLY): 27 min  Past Medical History:  Past Medical History  Diagnosis Date  . IBS (irritable bowel syndrome)   . Hypothyroidism   . Chronic fatigue fibromyalgia syndrome   . Meralgia paraesthetica   . Chronic low back pain   . Anemia     s/p gastric bypass, malabsorption  . B12 deficiency   . Acne rosacea   . Anxiety   . Arthritis   . Depression   . Status post dilation of esophageal narrowing   . GERD (gastroesophageal reflux disease)   . Stroke   . Sjogren's syndrome   . Graves disease   . Lymphedema   . H/O hiatal hernia   . Fibromyalgia    Past Surgical History:  Past Surgical History  Procedure Laterality Date  . Gastric bypass    . Laminectomy      L3-L5  . Replacement total knee  01/2011    right  . Appendectomy    . Thyroidectomy    . Abdominal hysterectomy    . Endometrial ablation      x 4  . Esophagogastroduodenoscopy  03/30/2012    Procedure: ESOPHAGOGASTRODUODENOSCOPY (EGD);  Surgeon: Louis Meckel, MD;  [facility];  Service: Endoscopy;  Laterality: N/A;  . Balloon dilation  03/30/2012    Procedure: BALLOON DILATION;  Surgeon: Louis Meckel, MD;  [facility];  Service: Endoscopy;  Laterality: N/A;  . Esophagogastroduodenoscopy  05/04/2012    Procedure: ESOPHAGOGASTRODUODENOSCOPY (EGD);  Surgeon: Louis Meckel, MD;  [facility];  Service: Endoscopy;  Laterality: N/A;  . Esophageal manometry N/A 05/15/2013    Procedure: ESOPHAGEAL MANOMETRY (EM);  Surgeon: Louis Meckel, MD;  Location: [facility];  Service: Endoscopy;  Laterality: N/A;   HPI:  HPI: 69 year old female known to Dr. Arlyce Dice with a prior history of dysphagia, esophageal  dysmotility, and achalasia. She was diagnosed with achalasia when she had a manometry [facility] in September 2014. It showed type II achalasia with LES being 87, INR P 50 and pan esophageal pressurization on manometry. She is status post Roux-en-Y gastric bypass in 2002. She has been evaluated by Dr. Arlyce Dice as well as Dr. Merri Brunette at [facility] for dysphagia of many years duration to both solids and liquids. She has had multiple EGDs and has had dilatations. She had 2 Botox injections since 2013 for dysphagia and then manometry in September 2014(Achalasia treatment options had been reviewed with pt and she had been referred to DR [facility] for balloon dilation). . Barium swallow in April 2013 did not show esophageal strictures and a 13 mm tablet passed freely into the stomach. EGD in September 2013 showed a stricture at the GE J which was treated with Botox and balloon dilation to 18 mm. Patient had relief of her symptoms for 5 or 6 months. And 15 and had a repeat esophagram that showed no narrowing but did show evidence of esophageal dysmotility. Seen by GI 4/5 due to new complaints of food and liquid "going down the wrong pipe", coughing with meals, coughing on saliva. Complex underlying medical history including IBS, hyopthyroidism, chronic fatigye, neuralgia paresthetica, anemia, Graves disease, GERD, hiatal hernia, CVA, Sjogren's syndrome, memory and balance issues noted. MBS ordered by MD to evaluate for possible transfer  dysphagia.   No Data Recorded  Assessment / Plan / Recommendation CHL IP CLINICAL IMPRESSIONS 12/04/2014  Dysphagia Diagnosis Suspected primary esophageal dysphagia  Clinical impression Patient presents with normal oropharyngeal swallowing function with full airway protection. Trace lingual residuals present on base of tongue due to chronic dry mouth. Tight appearing UES noted likely related to h/o esophageal deficits. Suspect that current symptoms of globus, coughing during po intake, related to  h/o esophageal dysfunction. Defer further w/u and/or treatment to primary MD. Education provided regarding general esophageal precautions. No SLP f/u indicated.       CHL IP TREATMENT RECOMMENDATION 12/04/2014  Treatment Plan Recommendations No treatment recommended at this time     CHL IP DIET RECOMMENDATION 12/04/2014  Diet Recommendations Regular;Thin liquid  Liquid Administration via Cup;Straw  Medication Administration Whole meds with liquid  Compensations Slow rate;Small sips/bites;Follow solids with liquid  Postural Changes and/or Swallow Maneuvers Seated upright 90 degrees;Upright 30-60 min after meal     CHL IP OTHER RECOMMENDATIONS 12/04/2014  Recommended Consults (None)  Oral Care Recommendations Oral care BID  Other Recommendations (None)     CHL IP FOLLOW UP RECOMMENDATIONS 12/04/2014  Follow up Recommendations None     No flowsheet data found.       SLP Swallow Goals No flowsheet data found.  No flowsheet data found.    CHL IP REASON FOR REFERRAL 12/04/2014  Reason for Referral Objectively evaluate swallowing function     CHL IP ORAL PHASE 12/04/2014  Lips (None)  Tongue (None)  Mucous membranes (None)  Nutritional status (None)  Other (None)  Oxygen therapy (None)  Oral Phase WFL  Oral - Pudding Teaspoon (None)  Oral - Pudding Cup (None)  Oral - Honey Teaspoon (None)  Oral - Honey Cup (None)  Oral - Honey Syringe (None)  Oral - Nectar Teaspoon (None)  Oral - Nectar Cup (None)  Oral - Nectar Straw (None)  Oral - Nectar Syringe (None)  Oral - Ice Chips (None)  Oral - Thin Teaspoon (None)  Oral - Thin Cup (None)  Oral - Thin Straw (None)  Oral - Thin Syringe (None)  Oral - Puree (None)  Oral - Mechanical Soft (None)  Oral - Regular (None)  Oral - Multi-consistency (None)  Oral - Pill (None)  Oral Phase - Comment (None)      CHL IP PHARYNGEAL PHASE 12/04/2014  Pharyngeal Phase WFL  Pharyngeal - Pudding Teaspoon (None)  Penetration/Aspiration  details (pudding teaspoon) (None)  Pharyngeal - Pudding Cup (None)  Penetration/Aspiration details (pudding cup) (None)  Pharyngeal - Honey Teaspoon (None)  Penetration/Aspiration details (honey teaspoon) (None)  Pharyngeal - Honey Cup (None)  Penetration/Aspiration details (honey cup) (None)  Pharyngeal - Honey Syringe (None)  Penetration/Aspiration details (honey syringe) (None)  Pharyngeal - Nectar Teaspoon (None)  Penetration/Aspiration details (nectar teaspoon) (None)  Pharyngeal - Nectar Cup (None)  Penetration/Aspiration details (nectar cup) (None)  Pharyngeal - Nectar Straw (None)  Penetration/Aspiration details (nectar straw) (None)  Pharyngeal - Nectar Syringe (None)  Penetration/Aspiration details (nectar syringe) (None)  Pharyngeal - Ice Chips (None)  Penetration/Aspiration details (ice chips) (None)  Pharyngeal - Thin Teaspoon (None)  Penetration/Aspiration details (thin teaspoon) (None)  Pharyngeal - Thin Cup (None)  Penetration/Aspiration details (thin cup) (None)  Pharyngeal - Thin Straw (None)  Penetration/Aspiration details (thin straw) (None)  Pharyngeal - Thin Syringe (None)  Penetration/Aspiration details (thin syringe') (None)  Pharyngeal - Puree (None)  Penetration/Aspiration details (puree) (None)  Pharyngeal - Mechanical Soft (None)  Penetration/Aspiration details (mechanical soft) (  None)  Pharyngeal - Regular (None)  Penetration/Aspiration details (regular) (None)  Pharyngeal - Multi-consistency (None)  Penetration/Aspiration details (multi-consistency) (None)  Pharyngeal - Pill (None)  Penetration/Aspiration details (pill) (None)  Pharyngeal Comment (None)     CHL IP CERVICAL ESOPHAGEAL PHASE 12/04/2014  Cervical Esophageal Phase Impaired  Pudding Teaspoon (None)  Pudding Cup (None)  Honey Teaspoon (None)  Honey Cup (None)  Honey Syringe (None)  Nectar Teaspoon (None)  Nectar Cup (None)  Nectar Straw (None)  Nectar Syringe (None)  Thin  Teaspoon (None)  Thin Cup (None)  Thin Straw (None)  Thin Syringe (None)  Cervical Esophageal Comment see radiologist comments    CHL IP GO 12/04/2014  Functional Assessment Tool Used skilled clinical judgement  Functional Limitations Swallowing  Swallow Current Status (Z6109(G8996) CI  Swallow Goal Status (U0454(G8997) CI  Swallow Discharge Status (U9811(G8998) CI  Motor Speech Current Status (B1478(G8999) (None)  Motor Speech Goal Status (G9562(G9186) (None)  Motor Speech Goal Status (Z3086(G9158) (None)  Spoken Language Comprehension Current Status (V7846(G9159) (None)  Spoken Language Comprehension Goal Status (N6295(G9160) (None)  Spoken Language Comprehension Discharge Status (M8413(G9161) (None)  Spoken Language Expression Current Status (K4401(G9162) (None)  Spoken Language Expression Goal Status (U2725(G9163) (None)  Spoken Language Expression Discharge Status [phone]([phone]) (None)  Attention Current Status (I3474(G9165) (None)  Attention Goal Status (Q5956(G9166) (None)  Attention Discharge Status (L8756(G9167) (None)  Memory Current Status (E3329(G9168) (None)  Memory Goal Status (J1884(G9169) (None)  Memory Discharge Status (Z6606(G9170) (None)  Voice Current Status (T0160(G9171) (None)  Voice Goal Status (F0932(G9172) (None)  Voice Discharge Status (T5573(G9173) (None)  Other Speech-Language Pathology Functional Limitation [phone](G9174) (None)  Other Speech-Language Pathology Functional Limitation Goal Status (K2706(G9175) (None)  Other Speech-Language Pathology Functional Limitation Discharge Status [phone](G9176) (None)   Ferdinand LangoLeah Reyan Helle MA, CCC-SLP [phone]         Lataysha Vohra Meryl 12/04/2014, 12:11 PM

## 2014-12-11 DIAGNOSIS — M961 Postlaminectomy syndrome, not elsewhere classified: Secondary | ICD-10-CM | POA: Diagnosis not present

## 2014-12-11 DIAGNOSIS — M47816 Spondylosis without myelopathy or radiculopathy, lumbar region: Secondary | ICD-10-CM | POA: Diagnosis not present

## 2014-12-11 DIAGNOSIS — Z79891 Long term (current) use of opiate analgesic: Secondary | ICD-10-CM | POA: Diagnosis not present

## 2014-12-11 DIAGNOSIS — G894 Chronic pain syndrome: Secondary | ICD-10-CM | POA: Diagnosis not present

## 2014-12-12 ENCOUNTER — Other Ambulatory Visit: Payer: Self-pay

## 2014-12-12 DIAGNOSIS — K220 Achalasia of cardia: Secondary | ICD-10-CM

## 2014-12-17 ENCOUNTER — Other Ambulatory Visit: Payer: Self-pay | Admitting: Gastroenterology

## 2014-12-21 DIAGNOSIS — E538 Deficiency of other specified B group vitamins: Secondary | ICD-10-CM | POA: Diagnosis not present

## 2014-12-26 ENCOUNTER — Encounter (HOSPITAL_COMMUNITY): Payer: Self-pay | Admitting: Gastroenterology

## 2014-12-26 ENCOUNTER — Encounter (HOSPITAL_COMMUNITY)
Admission: RE | Disposition: A | Discharge status: Home / Self Care | Payer: Self-pay | Source: Ambulatory Visit | Attending: Gastroenterology

## 2014-12-26 ENCOUNTER — Ambulatory Visit (HOSPITAL_COMMUNITY)
Admission: RE | Admit: 2014-12-26 | Discharge: 2014-12-26 | Disposition: A | Discharge status: Home / Self Care | Payer: Medicare Other | Source: Ambulatory Visit | Attending: Gastroenterology | Admitting: Gastroenterology

## 2014-12-26 DIAGNOSIS — I8500 Esophageal varices without bleeding: Secondary | ICD-10-CM | POA: Insufficient documentation

## 2014-12-26 DIAGNOSIS — Z931 Gastrostomy status: Secondary | ICD-10-CM | POA: Diagnosis not present

## 2014-12-26 DIAGNOSIS — R1310 Dysphagia, unspecified: Secondary | ICD-10-CM | POA: Insufficient documentation

## 2014-12-26 HISTORY — PX: ESOPHAGOGASTRODUODENOSCOPY: SHX5428

## 2014-12-26 HISTORY — PX: BOTOX INJECTION: SHX5754

## 2014-12-26 SURGERY — EGD (ESOPHAGOGASTRODUODENOSCOPY)
Anesthesia: Moderate Sedation

## 2014-12-26 MED ORDER — MIDAZOLAM HCL 10 MG/2ML IJ SOLN
INTRAMUSCULAR | Status: AC
  Filled 2014-12-26: qty 2

## 2014-12-26 MED ORDER — FENTANYL CITRATE (PF) 100 MCG/2ML IJ SOLN
INTRAMUSCULAR | Status: DC | PRN
  Administered 2014-12-26 (×2): 25 ug via INTRAVENOUS

## 2014-12-26 MED ORDER — ONABOTULINUMTOXINA 100 UNITS IJ SOLR
100.0000 [IU] | Freq: Once | INTRAMUSCULAR | Status: AC
  Administered 2014-12-26: 100 [IU] via SUBMUCOSAL
  Filled 2014-12-26: qty 100

## 2014-12-26 MED ORDER — SODIUM CHLORIDE 0.9 % IV SOLN: INTRAVENOUS | Status: DC

## 2014-12-26 MED ORDER — LACTATED RINGERS IV SOLN
INTRAVENOUS | Status: DC
  Administered 2014-12-26: 1000 mL via INTRAVENOUS

## 2014-12-26 MED ORDER — BUTAMBEN-TETRACAINE-BENZOCAINE 2-2-14 % EX AERO
INHALATION_SPRAY | CUTANEOUS | Status: DC | PRN
  Administered 2014-12-26: 2 via TOPICAL

## 2014-12-26 MED ORDER — FENTANYL CITRATE (PF) 100 MCG/2ML IJ SOLN
INTRAMUSCULAR | Status: AC
  Filled 2014-12-26: qty 2

## 2014-12-26 NOTE — Op Note (Signed)
[facility] [address] [place], [id number]   ENDOSCOPY PROCEDURE REPORT  PATIENT: Jillian Morris  MR#: [id number] BIRTHDATE: 1946/01/14 , 68  yrs. old GENDER: female ENDOSCOPIST: Louis Meckelobert D Kaplan, MD REFERRED BY: PROCEDURE DATE:  12/26/2014 PROCEDURE:  EGD w/ directed injection sclerosis of varices ASA CLASS:     Class II INDICATIONS:  history of achalasia, dysphagia. MEDICATIONS: Versed 4 mg IV and Fentanyl 50 mcg IV TOPICAL ANESTHETIC: Cetacaine Spray  DESCRIPTION OF PROCEDURE: After the risks benefits and alternatives of the procedure were thoroughly explained, informed consent was obtained.  The Pentax Gastroscope [id number] endoscope was introduced through the mouth and advanced to the proximal jejunum , Without limitations.  The instrument was slowly withdrawn as the mucosa was fully examined.    There was a small gastric remnant.  Afferent and E for loops were examined and were normal.  Gastrojejunostomy was patent. GE junction was located at 40 cm from the incisors.  1 cc (10 units) of Botox was injected submucosally into each quadrant at the GE junction.  Retroflexion was not performed.     The scope was then withdrawn from the patient and the procedure completed.  COMPLICATIONS: There were no immediate complications.  ENDOSCOPIC IMPRESSION: There was a small gastric remnant.  Afferent and E for loops were examined and were normal.  Gastrojejunostomy was patent. GE junction was located at 40 cm from the incisors.  1 cc (10 units) of Botox was injected submucosally into each quadrant at the GE junction  RECOMMENDATIONS: office follow-up 4-6 weeks  REPEAT EXAM:  eSigned:  Louis Meckelobert D Kaplan, MD 12/26/2014 12:57 PM    CC:

## 2014-12-26 NOTE — Discharge Instructions (Signed)
Esophagogastroduodenoscopy °Care After °Refer to this sheet in the next few weeks. These instructions provide you with information on caring for yourself after your procedure. Your caregiver may also give you more specific instructions. Your treatment has been planned according to current medical practices, but problems sometimes occur. Call your caregiver if you have any problems or questions after your procedure.  °HOME CARE INSTRUCTIONS °· Do not eat or drink anything until the numbing medicine (local anesthetic) has worn off and your gag reflex has returned. You will know that the local anesthetic has worn off when you can swallow comfortably. °· Do not drive for 12 hours after the procedure or as directed by your caregiver. °· Only take medicines as directed by your caregiver. °SEEK MEDICAL CARE IF:  °· You cannot stop coughing. °· You are not urinating at all or less than usual. °SEEK IMMEDIATE MEDICAL CARE IF: °· You have difficulty swallowing. °· You cannot eat or drink. °· You have worsening throat or chest pain. °· You have dizziness, lightheadedness, or you faint. °· You have nausea or vomiting. °· You have chills. °· You have a fever. °· You have severe abdominal pain. °· You have black, tarry, or bloody stools. °Document Released: 07/27/2012 Document Reviewed: 07/27/2012 °ExitCare® Patient Information ©2015 [employer]. This information is not intended to replace advice given to you by your health care provider. Make sure you discuss any questions you have with your health care provider. ° °

## 2014-12-26 NOTE — Interval H&P Note (Signed)
History and Physical Interval Note:  12/26/2014 12:30 PM  Jillian Morris  has presented today for surgery, with the diagnosis of Achalasia  The various methods of treatment have been discussed with the patient and family. After consideration of risks, benefits and other options for treatment, the patient has consented to  Procedure(s) with comments: ESOPHAGOGASTRODUODENOSCOPY (EGD) (N/A) - botox injection BOTOX INJECTION (N/A) as a surgical intervention .  The patient's history has been reviewed, patient examined, no change in status, stable for surgery.  I have reviewed the patient's chart and labs.  Questions were answered to the patient's satisfaction.    The recent H&P (dated *11/26/14**) was reviewed, the patient was examined and there is no change in the patients condition since that H&P was completed.   Melvia HeapsRobert Kaplan  12/26/2014, 12:30 PM    Melvia Heapsobert Kaplan

## 2014-12-26 NOTE — H&P (View-Only) (Signed)
Reviewed and agree with management.  I would add to your note that therapies for achalasia have been discussed and she was referred to [facility] for balloon dilation Barbette Hairobert D. Arlyce DiceKaplan, M.D., [facility]

## 2014-12-27 ENCOUNTER — Encounter (HOSPITAL_COMMUNITY): Payer: Self-pay | Admitting: Gastroenterology

## 2014-12-31 ENCOUNTER — Other Ambulatory Visit: Payer: Self-pay

## 2015-01-02 DIAGNOSIS — L219 Seborrheic dermatitis, unspecified: Secondary | ICD-10-CM | POA: Diagnosis not present

## 2015-01-02 DIAGNOSIS — R238 Other skin changes: Secondary | ICD-10-CM | POA: Diagnosis not present

## 2015-01-02 DIAGNOSIS — H6983 Other specified disorders of Eustachian tube, bilateral: Secondary | ICD-10-CM | POA: Diagnosis not present

## 2015-01-02 DIAGNOSIS — L719 Rosacea, unspecified: Secondary | ICD-10-CM | POA: Diagnosis not present

## 2015-01-23 DIAGNOSIS — D519 Vitamin B12 deficiency anemia, unspecified: Secondary | ICD-10-CM | POA: Diagnosis not present

## 2015-02-07 DIAGNOSIS — Z79891 Long term (current) use of opiate analgesic: Secondary | ICD-10-CM | POA: Diagnosis not present

## 2015-02-07 DIAGNOSIS — M47816 Spondylosis without myelopathy or radiculopathy, lumbar region: Secondary | ICD-10-CM | POA: Diagnosis not present

## 2015-02-07 DIAGNOSIS — G894 Chronic pain syndrome: Secondary | ICD-10-CM | POA: Diagnosis not present

## 2015-02-07 DIAGNOSIS — M961 Postlaminectomy syndrome, not elsewhere classified: Secondary | ICD-10-CM | POA: Diagnosis not present

## 2015-02-13 ENCOUNTER — Ambulatory Visit: Payer: Medicare Other | Admitting: Gastroenterology

## 2015-02-28 DIAGNOSIS — D519 Vitamin B12 deficiency anemia, unspecified: Secondary | ICD-10-CM | POA: Diagnosis not present

## 2015-02-28 DIAGNOSIS — E039 Hypothyroidism, unspecified: Secondary | ICD-10-CM | POA: Diagnosis not present

## 2015-04-02 DIAGNOSIS — D519 Vitamin B12 deficiency anemia, unspecified: Secondary | ICD-10-CM | POA: Diagnosis not present

## 2015-04-04 DIAGNOSIS — G894 Chronic pain syndrome: Secondary | ICD-10-CM | POA: Diagnosis not present

## 2015-04-04 DIAGNOSIS — M47816 Spondylosis without myelopathy or radiculopathy, lumbar region: Secondary | ICD-10-CM | POA: Diagnosis not present

## 2015-04-04 DIAGNOSIS — M961 Postlaminectomy syndrome, not elsewhere classified: Secondary | ICD-10-CM | POA: Diagnosis not present

## 2015-04-04 DIAGNOSIS — Z79891 Long term (current) use of opiate analgesic: Secondary | ICD-10-CM | POA: Diagnosis not present

## 2015-05-02 DIAGNOSIS — M47816 Spondylosis without myelopathy or radiculopathy, lumbar region: Secondary | ICD-10-CM | POA: Diagnosis not present

## 2015-05-02 DIAGNOSIS — M961 Postlaminectomy syndrome, not elsewhere classified: Secondary | ICD-10-CM | POA: Diagnosis not present

## 2015-05-02 DIAGNOSIS — Z79891 Long term (current) use of opiate analgesic: Secondary | ICD-10-CM | POA: Diagnosis not present

## 2015-05-02 DIAGNOSIS — G894 Chronic pain syndrome: Secondary | ICD-10-CM | POA: Diagnosis not present

## 2015-05-07 DIAGNOSIS — E538 Deficiency of other specified B group vitamins: Secondary | ICD-10-CM | POA: Diagnosis not present

## 2015-05-16 DIAGNOSIS — F3181 Bipolar II disorder: Secondary | ICD-10-CM | POA: Diagnosis not present

## 2015-06-13 DIAGNOSIS — E538 Deficiency of other specified B group vitamins: Secondary | ICD-10-CM | POA: Diagnosis not present

## 2015-07-11 DIAGNOSIS — F3181 Bipolar II disorder: Secondary | ICD-10-CM | POA: Diagnosis not present

## 2015-07-16 DIAGNOSIS — E538 Deficiency of other specified B group vitamins: Secondary | ICD-10-CM | POA: Diagnosis not present

## 2015-08-08 DIAGNOSIS — G894 Chronic pain syndrome: Secondary | ICD-10-CM | POA: Diagnosis not present

## 2015-08-08 DIAGNOSIS — M961 Postlaminectomy syndrome, not elsewhere classified: Secondary | ICD-10-CM | POA: Diagnosis not present

## 2015-08-08 DIAGNOSIS — M47816 Spondylosis without myelopathy or radiculopathy, lumbar region: Secondary | ICD-10-CM | POA: Diagnosis not present

## 2015-08-08 DIAGNOSIS — Z79891 Long term (current) use of opiate analgesic: Secondary | ICD-10-CM | POA: Diagnosis not present

## 2015-08-13 DIAGNOSIS — H34831 Tributary (branch) retinal vein occlusion, right eye, with macular edema: Secondary | ICD-10-CM | POA: Diagnosis not present

## 2015-08-13 DIAGNOSIS — M3500 Sicca syndrome, unspecified: Secondary | ICD-10-CM | POA: Diagnosis not present

## 2015-08-13 DIAGNOSIS — H2513 Age-related nuclear cataract, bilateral: Secondary | ICD-10-CM | POA: Diagnosis not present

## 2015-08-15 DIAGNOSIS — E039 Hypothyroidism, unspecified: Secondary | ICD-10-CM | POA: Diagnosis not present

## 2015-08-15 DIAGNOSIS — D519 Vitamin B12 deficiency anemia, unspecified: Secondary | ICD-10-CM | POA: Diagnosis not present

## 2015-08-29 ENCOUNTER — Encounter (INDEPENDENT_AMBULATORY_CARE_PROVIDER_SITE_OTHER): Payer: Medicare Other | Admitting: Ophthalmology

## 2015-09-03 ENCOUNTER — Encounter (INDEPENDENT_AMBULATORY_CARE_PROVIDER_SITE_OTHER): Payer: Medicare Other | Admitting: Ophthalmology

## 2015-09-06 ENCOUNTER — Encounter (INDEPENDENT_AMBULATORY_CARE_PROVIDER_SITE_OTHER): Payer: Medicare Other | Admitting: Ophthalmology

## 2015-09-06 DIAGNOSIS — H348312 Tributary (branch) retinal vein occlusion, right eye, stable: Secondary | ICD-10-CM

## 2015-09-06 DIAGNOSIS — H43813 Vitreous degeneration, bilateral: Secondary | ICD-10-CM

## 2015-09-06 DIAGNOSIS — H2513 Age-related nuclear cataract, bilateral: Secondary | ICD-10-CM | POA: Diagnosis not present

## 2015-12-06 ENCOUNTER — Other Ambulatory Visit: Payer: Self-pay | Admitting: Gastroenterology

## 2015-12-11 ENCOUNTER — Encounter (INDEPENDENT_AMBULATORY_CARE_PROVIDER_SITE_OTHER): Payer: Medicare Other | Admitting: Ophthalmology

## 2016-03-09 ENCOUNTER — Encounter: Payer: Self-pay | Admitting: Neurology

## 2016-03-09 ENCOUNTER — Ambulatory Visit (INDEPENDENT_AMBULATORY_CARE_PROVIDER_SITE_OTHER): Payer: Medicare Other | Admitting: Neurology

## 2016-03-09 VITALS — BP 162/82 | HR 80 | Resp 16 | Ht 63.0 in | Wt 182.0 lb

## 2016-03-09 DIAGNOSIS — Z79899 Other long term (current) drug therapy: Secondary | ICD-10-CM

## 2016-03-09 DIAGNOSIS — R296 Repeated falls: Secondary | ICD-10-CM

## 2016-03-09 DIAGNOSIS — R413 Other amnesia: Secondary | ICD-10-CM | POA: Diagnosis not present

## 2016-03-09 DIAGNOSIS — R29818 Other symptoms and signs involving the nervous system: Secondary | ICD-10-CM

## 2016-03-09 DIAGNOSIS — Z79891 Long term (current) use of opiate analgesic: Secondary | ICD-10-CM

## 2016-03-09 DIAGNOSIS — R2689 Other abnormalities of gait and mobility: Secondary | ICD-10-CM

## 2016-03-09 NOTE — Patient Instructions (Addendum)
We will do an EEG (brainwave test), which we will schedule. We will call you with the results. We will do a brain scan, called MRI and call you with the test results. We will have to schedule you for this on a separate date. This test requires authorization from your insurance, and we will take care of the insurance process.  We do a formal neuropsychological test (aka cognitive testing) for your memory complaints. This requires a referral to a trained and licensed neuropsychologist and will be a separate appointment at a different clinic.

## 2016-03-09 NOTE — Progress Notes (Signed)
Subjective:    Patient ID: Jillian Morris is a 70 y.o. female.  HPI     Interim history:  Jillian Morris is a 70 year-old right-handed woman with an underlying complex medical history of IBS (irritable bowel syndrome), hypothyroidism, chronic fatigue, FMS, meralgia paraesthetica, chronic low back pain, anemia and B12 deficiency, secondary to gastric bypass surgery with malabsorption, eczema, rosacea, anxiety, arthritis, depression, dysphagia, secondary to esophageal stricture (status post dilation of esophageal narrowing), GERD, stroke, Sjogren's syndrome, Graves disease and lymphedema, who presents for follow-up consultation of her memory issues and balance problems. The patient is accompanied by her husband today and is re-referred by her primary care physician, Dr. Kenton Kingfisher.  I first met her on 06/06/2014, at which time she reported a long-standing history of balance issues and memory issues. Symptoms were progressive. She had fallen in the past and had physical therapy when she had had her knee replacement surgery. She had missed some recent B12 injections per husband's report.   Today, 03/09/2016: She reports more issues with memory, recently got confused driving and was gone long enough that her husband call 911. She had a couple of bouts of of vertigo in the last 3 months. Says, her thyroid function is not completely normal either. She gets monthly B12 injections. She had PT for gait and balance about a month ago ago. Sees Dr. Hardin Negus for pain management. She has had a few falls, thankfully without injuries reported. Of note, she is on Opana ER, oxymorphone tid, lorazepam for sleep, lamictal 50 mg tid, and prn Xanax. She had a recent office visit with Dr. Kenton Kingfisher on 02/13/2016 and I reviewed the note. She had blood work at the time including CBC with differential, CMP, vitamin B12 and TSH. We will request test results. She says that she has in the past seen an endocrinologist. She has worked with  her pain management doctor to reduce her pain medication. She feels that she has residual pain on the current regimen. She has an appointment coming up soon with her pain specialist. She has a very irregular sleep wake schedule she admits. She tries to drink enough water and stay well-hydrated. She has started using a cane for gait safety as recommended by her primary care physician.  She had a head CT without contrast on 09/03/2014 after a fall, and I reviewed the test results: IMPRESSION: No acute intracranial abnormality.   Previously:  06/06/2014: She reports a longstanding history of balance issues and memory issues. She feels her short-term memory has suffered. She feels her condition has progressed. She does report having fallen in the past. She had physical therapy in the past when she had her knee replacement surgery, but not more recently. Her husband notes that she has missed a couple of B12 injection appointments as I understand.  She had a CT head with without contrast on 08/19/2012: No acute intracranial abnormality. Stable mild atrophy and chronic microvascular change.   She had her lumbar spine MRI without contrast on 07/24/2009: 1. Little interval change from prior exam with slight progression of left foraminal stenosis at L3-L4 compared to the prior exam of 2009. 2. Laminectomy and posterior decompression extending from L3-L4 through L5-S1. No recurrent central or lateral recess stenosis. Foraminal stenosis as described above. 3. In this patient with right radicular symptoms, question right L4 radiculitis associated with compression in the right L4-L5 neural foramen. She had a brain MRI with and without contrast and a brain MRA without contrast on 03/16/2002 ordered  by Dr. Erling Cruz with the indication of confusion, dizziness, syncope and fainting: These were reported as normal.  She had previously seen Dr. Erling Cruz in our office, last in 2009. I reviewed the office note from 07/26/2008, at  which time she presented for balance problems and falls. In July and August 2008 she underwent neuropsychological evaluation for complaints of memory problems and test results were in keeping with normal intellectual functioning. Dr. Erling Cruz started her on aspirin. An MMSE score was 30 out of 30 at the time. She had seen Dr. Erling Cruz back in June 2008 for balance problems and falls. Her MMSE was 30 out of 30 at the time, clock drawing was 4 out of 4. He did not notice any significant gait disorder at the time and suspected that in view of her history of gastric bypass, peripheral neuropathy should be suspected and he ordered an EMG and nerve conduction test as well as EEG. He check TSH, RPR and vitamin B12 level as well as methylmalonic acid. She had an EMG and nerve conduction test on 02/10/2007 which I reviewed. This was done by Dr. 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 She had an EEG on 02/10/2007 which was interpreted as normal EEG during the awake and drowsy states. MRI of the cervical spine from 03/17/2007 showed prominent spondylolytic changes throughout most noticeable at C5-6 and C6-7 with mild canal stenosis at C5-6 with no significant foraminal stenosis, root encroachment or cord signal abnormality.   Her Past Medical History Is Significant For: Past Medical History  Diagnosis Date  . IBS (irritable bowel syndrome)   . Hypothyroidism   . Chronic fatigue fibromyalgia syndrome   . Meralgia paraesthetica   . Chronic low back pain   . Anemia     s/p gastric bypass, malabsorption  . B12 deficiency   . Acne rosacea   . Anxiety   . Arthritis   . Depression   . Status post dilation of esophageal narrowing   . GERD (gastroesophageal reflux disease)   . Stroke ([place])   . Sjogren's syndrome ([place])   . Graves disease   . Lymphedema   . H/O hiatal hernia   .  Fibromyalgia     Her Past Surgical History Is Significant For: Past Surgical History  Procedure Laterality Date  . Gastric bypass    . Laminectomy      L3-L5  . Replacement total knee  01/2011    right  . Appendectomy    . Thyroidectomy    . Abdominal hysterectomy    . Endometrial ablation      x 4  . Esophagogastroduodenoscopy  03/30/2012    Procedure: ESOPHAGOGASTRODUODENOSCOPY (EGD);  Surgeon: Inda Castle, MD;  Location: [facility];  Service: Endoscopy;  Laterality: N/A;  . Balloon dilation  03/30/2012    Procedure: BALLOON DILATION;  Surgeon: Inda Castle, MD;  Location: [facility];  Service: Endoscopy;  Laterality: N/A;  . Esophagogastroduodenoscopy  05/04/2012    Procedure: ESOPHAGOGASTRODUODENOSCOPY (EGD);  Surgeon: Inda Castle, MD;  Location: [facility];  Service: Endoscopy;  Laterality: N/A;  . Esophageal manometry N/A 05/15/2013    Procedure: ESOPHAGEAL MANOMETRY (EM);  Surgeon: Inda Castle, MD;  Location: [facility];  Service: Endoscopy;  Laterality: N/A;  . Esophagogastroduodenoscopy N/A 12/26/2014    Procedure: ESOPHAGOGASTRODUODENOSCOPY (EGD);  Surgeon: Inda Castle, MD;  Location: [facility];  Service: Endoscopy;  Laterality: N/A;  botox injection  . Botox injection N/A 12/26/2014    Procedure: BOTOX INJECTION;  Surgeon: Inda Castle, MD;  Location: [facility];  Service: Endoscopy;  Laterality: N/A;    Her Family History Is Significant For: Family History  Problem Relation Age of Onset  . Prostate cancer Maternal Grandfather   . Lymphoma Maternal Uncle   . Heart disease Father   . Heart disease Sister   . Irritable bowel syndrome Sister   . Dementia Mother   . Stroke Maternal Grandmother   . Appendicitis Paternal Grandmother   . Colon cancer Neg Hx     Her Social History Is Significant For: Social History   Social History  . Marital Status: Married    Spouse Name: N/A  . Number of Children: 0  . Years of Education: N/A    Occupational History  . housewife    Social History Main Topics  . Smoking status: Never Smoker   . Smokeless tobacco: Never Used  . Alcohol Use: No  . Drug Use: No  . Sexual Activity: Not Asked   Other Topics Concern  . None   Social History Narrative   Right handed, Married, Step kids 2, Caffeine 4-5 cups daily, graduate school   Rare caffeine use     Her Allergies Are:  Allergies  Allergen Reactions  . Actonel [Risedronate Sodium]     GI Upset  . Ambien [Zolpidem Tartrate]   . Augmentin [Amoxicillin-Pot Clavulanate]   . Depakote [Divalproex Sodium]     Hallucinations  . Inderal [Propranolol]   . Latex   . Nickel   . Nsaids Other (See Comments)    Pt. Had gastric bypass surgery   . Penicillins   . Silicon Other (See Comments)  :   Her Current Medications Are:  Outpatient Encounter Prescriptions as of 03/09/2016  Medication Sig  . ALPRAZolam (XANAX) 0.25 MG tablet   . cyanocobalamin (,VITAMIN B-12,) 1000 MCG/ML injection Inject into the muscle.  Marland Kitchen dexlansoprazole (DEXILANT) 60 MG capsule TAKE 1 CAPSULE (60 MG TOTAL) BY MOUTH DAILY.  . DULoxetine (CYMBALTA) 60 MG capsule TAKE 2 CAPSULES BY MOUTH EVERY EVENING  . LamoTRIgine (LAMICTAL ODT) 50 MG TBDP Take 1 tablet by mouth 3 (three) times daily.  . Levothyroxine Sodium (TIROSINT PO) Take 100 mcg by mouth daily.   Marland Kitchen lidocaine (LIDODERM) 5 % Place 1 patch onto the skin daily. Remove & Discard patch within 12 hours or as directed by MD  . LORazepam (ATIVAN) 1 MG tablet Take 1 mg by mouth 3 (three) times daily as needed.  . metroNIDAZOLE (METROCREAM) 0.75 % cream   . mupirocin ointment (BACTROBAN) 2 % Apply 1 application topically 3 (three) times daily.  Marland Kitchen omeprazole (PRILOSEC) 40 MG capsule TAKE ONE CAPSULE BY MOUTH EVERY DAY  . OPANA ER, CRUSH RESISTANT, 10 MG T12A 12 hr tablet 1 TABLET BY MOUTH EVERY TWELVE HOURS  . ranitidine (ZANTAC) 150 MG capsule TAKE 1 CAPSULE AT BEDTIME ONCE A DAY ORALLY 30 DAY(S)  .  rOPINIRole (REQUIP) 1 MG tablet 1 TABLET 1 TO 3 HOURS BEFORE BEDTIME ONCE A DAY ORALLY 30 DAY(S)  . SYNTHROID 125 MCG tablet TAKE 1 TABLET BY MOUTH EVERY MORNING ON EMPTY STOMACH ONCE DAILY  . [DISCONTINUED] cyanocobalamin (,VITAMIN B-12,) 1000 MCG/ML injection Inject 1,000 mcg into the muscle every 30 (thirty) days.  . [DISCONTINUED] dicyclomine (BENTYL) 10 MG capsule Take 10 mg by mouth 4 (four) times daily as needed.  . [DISCONTINUED] DULoxetine (  CYMBALTA) 30 MG capsule Take 90 mg by mouth daily.  . [DISCONTINUED] hydrocortisone valerate ointment (WEST-CORT) 0.2 % Apply 1 application topically 2 (two) times daily as needed.   . [DISCONTINUED] oxymorphone (OPANA ER) 30 MG 12 hr tablet Take 20 mg by mouth every 12 (twelve) hours.   . [DISCONTINUED] oxymorphone (OPANA) 10 MG tablet Take 10 mg by mouth every 4 (four) hours as needed.   No facility-administered encounter medications on file as of 03/09/2016.  :  Review of Systems:  Out of a complete 14 point review of systems, all are reviewed and negative with the exception of these symptoms as listed below:   Review of Systems  Neurological: Positive for dizziness.       Patient and her husband feel that her memory loss is a little more since last visit.  She states that her balance is worse and had recent R leg pain that is almost resolved. Patient does go to a pain clinic.     Objective:  Neurologic Exam  Physical Exam Physical Examination:   Filed Vitals:   03/09/16 1540 03/09/16 1554  BP: 180/94 162/82  Pulse: 78 80  Resp: 16 16   Orthostatic blood pressure vitals show a 12 point diastolic drop when standing but she has no significant lightheadedness or symptoms upon standing, she feels more insecure with walking. Repeat blood pressure towards the end of the visit was 156/88.  General Examination: The patient is a very pleasant 70 y.o. female in no acute distress. She appears well-developed and well-nourished and very well  groomed. She is pleasant and conversant.  HEENT: Normocephalic, atraumatic, pupils are equal, round and reactive to light and accommodation. She has bilateral cataracts. Funduscopic exam is normal with sharp disc margins noted. Extraocular tracking is good without limitation to gaze excursion or nystagmus noted. Normal smooth pursuit is noted. Hearing is grossly intact. Face is symmetric with normal facial animation and normal facial sensation. Speech is clear with no dysarthria noted. There is no hypophonia. There is no lip, neck/head, jaw or voice tremor. Neck is supple with full range of passive and active motion. There are no carotid bruits on auscultation. Oropharynx exam reveals: moderate mouth dryness, adequate dental hygiene and no significant airway tightness noted. Mallampati is class II.  Chest: Clear to auscultation without wheezing, rhonchi or crackles noted.  Heart: S1+S2+0, regular and normal without murmurs, rubs or gallops noted.   Abdomen: Soft, non-tender and non-distended with normal bowel sounds appreciated on auscultation.  Extremities: There is nonpitting puffiness of her distal legs bilaterally. Pedal pulses are intact.  Skin: Warm and dry without trophic changes noted. There are no varicose veins.  Musculoskeletal: exam reveals no obvious joint deformities, tenderness or joint swelling or erythema.   Neurologically:  Mental status: The patient is awake, alert and oriented in all 4 spheres. Her immediate and remote memory, attention, language skills and fund of knowledge are fairly good. She has some hesitation in answering questions and looks to her husband. There is no overt evidence of aphasia, agnosia, apraxia or anomia.  On 06/06/14: Her MMSE is 28/30.   On 03/09/2016: MMSE: 29/30, CDT: 4/4, AFT: 13/min.   There is very mild slowness in thinking noted. Speech shows no hypophonia with normal prosody and enunciation. Thought process is linear. Mood is constricted and  affect is blunted.  Cranial nerves II - XII are as described above under HEENT exam. In addition: shoulder shrug is normal with equal shoulder height noted. Motor exam:  Normal bulk, strength and tone is noted. There is no drift, tremor or rebound. Romberg is negative. Reflexes are 1-2+ throughout. Fine motor skills and coordination: intact with normal finger taps, normal hand movements, normal rapid alternating patting, normal foot taps and normal foot agility.  Cerebellar testing: No dysmetria or intention tremor on finger to nose testing. Heel to shin is unremarkable bilaterally. There is no truncal or gait ataxia.  Sensory exam: intact to light touch, pinprick, vibration, temperature sense in the upper and lower extremities with the exception of mild decrease in pinprick sensation and temperature sensation in both feet.  Gait, station and balance: She stands with difficulty and pushes herself up. No veering to one side is noted. No leaning to one side is noted. Posture is age-appropriate and stance is slightly wide-based. She walks slowly with her cane. She turns in 3 steps she walks insecurely. Tandem walk is not possible for her, no vertigo reported.   Assessment and Plan:   In summary, Jillian Morris is a very pleasant 70 year old female with an underlying complex medical history of IBS (irritable bowel syndrome), hypothyroidism, chronic fatigue, FMS, meralgia paraesthetica, chronic low back pain, followed by pain management, on narcotic pain medications, anemia and B12 deficiency, secondary to gastric bypass surgery with malabsorption history, eczema, rosacea, anxiety, arthritis, depression, dysphagia, secondary to esophageal stricture (status post dilation of esophageal narrowing), GERD, stroke, Sjogren's syndrome, Graves disease and lymphedema, who reports a longstanding history of balance issues and memory issues. She presents for re-evaluation. She has had some falls, thankfully without  injuries reported. She had previously seen Dr. Erling Cruz in the past, had an MRI brain in 2003 with unremarkable findings, had memory testing with formal cognitive testing in 2008 with normal results at the time, and previous extensive blood work as well, EMG and nerve conduction testing as well. Her memory scores are stable. I explained to the patient and her husband that her memory issues and balance issues are most likely not rooted in 1 single problem and unfortunately there is no quick fix for her problems either. Supportive care, healthy lifestyle, staying active mentally and physically and streamlining medication will be key. Contributing factors to her gait d/o and memory issues include degenerative arthritis,  improvement of. Contributing factors would be degenerative spine disease, normal aging, B12 deficiency, dehydration, sedating medications such as narcotic medication, arthritis such as her knee arthritis. I believe that she has a multifactorial gait disorder and memory issues secondary to the above problems, including medication effects. Of note, taking narcotic pain medication in conjunction with benzodiazepines can cause additional sedation. She is advised to  use her cane for gait safety, stay well-hydrated with water. We will do a brain MRI and EEG and compared findings from previous results as well as a repeat cognitive evaluation and compared results with 2008 if possible. I will see her back after these tests are completed and we will also keep them posted via phone call as to the results. I answered all their questions today and the patient and her husband were in agreement with the plan. I spent 30 minutes in total face-to-face time with the patient, more than 50% of which was spent in counseling and coordination of care, reviewing test results, reviewing medication and discussing or reviewing the diagnosis of memory loss, gait d/o, the prognosis and treatment options.

## 2016-03-12 ENCOUNTER — Ambulatory Visit (INDEPENDENT_AMBULATORY_CARE_PROVIDER_SITE_OTHER): Payer: Medicare Other | Admitting: Neurology

## 2016-03-12 DIAGNOSIS — R413 Other amnesia: Secondary | ICD-10-CM

## 2016-03-12 DIAGNOSIS — R410 Disorientation, unspecified: Secondary | ICD-10-CM

## 2016-03-12 DIAGNOSIS — R2689 Other abnormalities of gait and mobility: Secondary | ICD-10-CM

## 2016-03-12 DIAGNOSIS — R296 Repeated falls: Secondary | ICD-10-CM

## 2016-03-13 NOTE — Procedures (Signed)
    History:   Jillian LaymanBrenda Avellino is a 70 year old patient with a history of chronic fatigue, irritable bowel syndrome, fibromyalgia, and chronic low back pain. The patient has had problems with memory and balance issues. The patient had a recent episode of confusion while driving, husband called [phone] because of this. The patient being evaluated for this event.  This is a routine EEG. No skull defects are noted. Medications include Xanax, Dexilant, Cymbalta, Lamictal, Ativan, Prilosec, Opana ER, Zantac, Requip, and Synthroid.  EEG classification: Essentially normal awake  Description of the recording: The background rhythms of this recording consists of a fairly well modulated medium amplitude alpha rhythm of 8 Hz that is reactive to eye opening and closure. As the record progresses, the patient appears to remain in the waking state throughout the recording. Photic stimulation was performed, resulting in a bilateral and symmetric photic driving response. Hyperventilation was also performed, resulting in a minimal buildup of the background rhythm activities without significant slowing seen. A significant amount of movement artifact was seen throughout the recording. At no time during the recording does there appear to be evidence of spike or spike wave discharges or evidence of focal slowing. EKG monitor shows no evidence of cardiac rhythm abnormalities with a heart rate of 66.  Impression: This is an essentially normal EEG recording in the waking state. Movement artifact was prominent during the recording. No evidence of ictal or interictal discharges are seen.

## 2016-03-13 NOTE — Progress Notes (Signed)
Quick Note:  Please call and advise the patient that the EEG or brain wave test we performed was reported as normal in the awake state, except for prominent movement artifacts. We checked for abnormal electrical discharges in the brain waves and the report suggested normal findings. No further action is required on this test at this time. Please remind patient to keep any upcoming appointments or tests and to call us with any interim questions, concerns, problems or updates. Thanks,  Huston FoleySaima Margaretta Chittum, MD, PhD   ______

## 2016-03-16 ENCOUNTER — Telehealth: Payer: Self-pay

## 2016-03-16 NOTE — Telephone Encounter (Signed)
-----   Message from Huston Foley, MD sent at 03/13/2016 12:07 PM EDT ----- Please call and advise the patient that the EEG or brain wave test we performed was reported as normal in the awake state, except for prominent movement artifacts. We checked for abnormal electrical discharges in the brain waves and the report suggested normal findings. No further action is required on this test at this time. Please remind patient to keep any upcoming appointments or tests and to call us with any interim questions, concerns, problems or updates. Thanks,  Huston Foley, MD, PhD

## 2016-03-16 NOTE — Telephone Encounter (Signed)
I spoke to patient and she is aware of results.  

## 2016-03-16 NOTE — Telephone Encounter (Signed)
Gentleman answered phone and stated that patient will not be back home until after 1pm. I will try back later.

## 2016-03-22 ENCOUNTER — Ambulatory Visit
Admission: RE | Admit: 2016-03-22 | Discharge: 2016-03-22 | Disposition: A | Discharge status: Home / Self Care | Payer: Medicare Other | Source: Ambulatory Visit | Attending: Neurology | Admitting: Neurology

## 2016-03-22 DIAGNOSIS — R2689 Other abnormalities of gait and mobility: Secondary | ICD-10-CM

## 2016-03-22 DIAGNOSIS — R413 Other amnesia: Secondary | ICD-10-CM

## 2016-03-22 DIAGNOSIS — R296 Repeated falls: Secondary | ICD-10-CM

## 2016-03-22 DIAGNOSIS — R29818 Other symptoms and signs involving the nervous system: Secondary | ICD-10-CM | POA: Diagnosis not present

## 2016-03-23 NOTE — Progress Notes (Signed)
Please call patient regarding the recent brain MRI: The brain scan showed a normal structure of the brain and moderate volume loss which we call atrophy. There were changes in the deeper structures of the brain, which we call white matter changes or microvascular changes. These were reported as moderate in Her case. These are tiny white spots, that occur with time and are seen in a variety of conditions, including with normal aging, chronic hypertension, chronic headaches, especially migraine HAs, chronic diabetes, chronic hyperlipidemia. These are not strokes and no mass or lesion were seen which is reassuring. Again, there were no acute findings, such as a stroke, or mass or blood products.  Compared to her brain MRI from 12/09, there is progression in the atrophy and her white matter changes, but expected to see progression in over 7 1/2 years. No further action is required on this test at this time, other than re-enforcing the importance of good blood pressure control, good cholesterol control, good blood sugar control, and weight management. Please remind patient to keep any upcoming appointments or tests and to call us with any interim questions, concerns, problems or updates. Thanks,  Huston Foley, MD, PhD

## 2016-03-24 ENCOUNTER — Telehealth: Payer: Self-pay

## 2016-03-24 NOTE — Telephone Encounter (Signed)
-----   Message from Huston Foley, MD sent at 03/23/2016  5:03 PM EDT ----- Please call patient regarding the recent brain MRI: The brain scan showed a normal structure of the brain and moderate volume loss which we call atrophy. There were changes in the deeper structures of the brain, which we call white matter changes or microvascular changes. These were reported as moderate in Her case. These are tiny white spots, that occur with time and are seen in a variety of conditions, including with normal aging, chronic hypertension, chronic headaches, especially migraine HAs, chronic diabetes, chronic hyperlipidemia. These are not strokes and no mass or lesion were seen which is reassuring. Again, there were no acute findings, such as a stroke, or mass or blood products.  Compared to her brain MRI from 12/09, there is progression in the atrophy and her white matter changes, but expected to see progression in over 7 1/2 years. No further action is required on this test at this time, other than re-enforcing the importance of good blood pressure control, good cholesterol control, good blood sugar control, and weight management. Please remind patient to keep any upcoming appointments or tests and to call us with any interim questions, concerns, problems or updates. Thanks,  Huston Foley, MD, PhD

## 2016-03-24 NOTE — Telephone Encounter (Signed)
Patient is returning your call.  

## 2016-03-24 NOTE — Telephone Encounter (Signed)
I called and spoke to female who states patient is not available now. He will have patient call back when she is available.

## 2016-03-25 NOTE — Telephone Encounter (Signed)
I spoke to patient and she is aware of results and recommendations. Voiced understanding.  

## 2016-04-08 ENCOUNTER — Other Ambulatory Visit: Payer: Self-pay | Admitting: Family Medicine

## 2016-04-08 DIAGNOSIS — Z1231 Encounter for screening mammogram for malignant neoplasm of breast: Secondary | ICD-10-CM

## 2016-04-16 ENCOUNTER — Emergency Department (HOSPITAL_COMMUNITY): Payer: Medicare Other

## 2016-04-16 ENCOUNTER — Inpatient Hospital Stay (HOSPITAL_COMMUNITY)
Admission: EM | Admit: 2016-04-16 | Discharge: 2016-04-20 | DRG: 131 | Disposition: A | Discharge status: Rehabilitation Facility | Payer: Medicare Other | Attending: General Surgery | Admitting: General Surgery

## 2016-04-16 ENCOUNTER — Encounter (HOSPITAL_COMMUNITY): Payer: Self-pay | Admitting: Emergency Medicine

## 2016-04-16 DIAGNOSIS — Z79891 Long term (current) use of opiate analgesic: Secondary | ICD-10-CM

## 2016-04-16 DIAGNOSIS — R402363 Coma scale, best motor response, obeys commands, at hospital admission: Secondary | ICD-10-CM | POA: Diagnosis present

## 2016-04-16 DIAGNOSIS — S066X9A Traumatic subarachnoid hemorrhage with loss of consciousness of unspecified duration, initial encounter: Secondary | ICD-10-CM | POA: Diagnosis present

## 2016-04-16 DIAGNOSIS — R402143 Coma scale, eyes open, spontaneous, at hospital admission: Secondary | ICD-10-CM | POA: Diagnosis present

## 2016-04-16 DIAGNOSIS — Z888 Allergy status to other drugs, medicaments and biological substances status: Secondary | ICD-10-CM

## 2016-04-16 DIAGNOSIS — H532 Diplopia: Secondary | ICD-10-CM | POA: Diagnosis present

## 2016-04-16 DIAGNOSIS — F419 Anxiety disorder, unspecified: Secondary | ICD-10-CM | POA: Diagnosis present

## 2016-04-16 DIAGNOSIS — I609 Nontraumatic subarachnoid hemorrhage, unspecified: Secondary | ICD-10-CM

## 2016-04-16 DIAGNOSIS — K589 Irritable bowel syndrome without diarrhea: Secondary | ICD-10-CM | POA: Diagnosis present

## 2016-04-16 DIAGNOSIS — Z881 Allergy status to other antibiotic agents status: Secondary | ICD-10-CM

## 2016-04-16 DIAGNOSIS — S0240EA Zygomatic fracture, right side, initial encounter for closed fracture: Secondary | ICD-10-CM | POA: Diagnosis not present

## 2016-04-16 DIAGNOSIS — S52571A Other intraarticular fracture of lower end of right radius, initial encounter for closed fracture: Secondary | ICD-10-CM | POA: Diagnosis present

## 2016-04-16 DIAGNOSIS — D649 Anemia, unspecified: Secondary | ICD-10-CM | POA: Diagnosis present

## 2016-04-16 DIAGNOSIS — S52501A Unspecified fracture of the lower end of right radius, initial encounter for closed fracture: Secondary | ICD-10-CM

## 2016-04-16 DIAGNOSIS — E890 Postprocedural hypothyroidism: Secondary | ICD-10-CM | POA: Diagnosis present

## 2016-04-16 DIAGNOSIS — Y9301 Activity, walking, marching and hiking: Secondary | ICD-10-CM | POA: Diagnosis present

## 2016-04-16 DIAGNOSIS — R51 Headache: Secondary | ICD-10-CM | POA: Diagnosis not present

## 2016-04-16 DIAGNOSIS — Z823 Family history of stroke: Secondary | ICD-10-CM

## 2016-04-16 DIAGNOSIS — S066X3A Traumatic subarachnoid hemorrhage with loss of consciousness of 1 hour to 5 hours 59 minutes, initial encounter: Secondary | ICD-10-CM

## 2016-04-16 DIAGNOSIS — Z8673 Personal history of transient ischemic attack (TIA), and cerebral infarction without residual deficits: Secondary | ICD-10-CM

## 2016-04-16 DIAGNOSIS — S62101A Fracture of unspecified carpal bone, right wrist, initial encounter for closed fracture: Secondary | ICD-10-CM | POA: Diagnosis present

## 2016-04-16 DIAGNOSIS — R402253 Coma scale, best verbal response, oriented, at hospital admission: Secondary | ICD-10-CM | POA: Diagnosis present

## 2016-04-16 DIAGNOSIS — F329 Major depressive disorder, single episode, unspecified: Secondary | ICD-10-CM

## 2016-04-16 DIAGNOSIS — Z880 Allergy status to penicillin: Secondary | ICD-10-CM

## 2016-04-16 DIAGNOSIS — E538 Deficiency of other specified B group vitamins: Secondary | ICD-10-CM | POA: Diagnosis present

## 2016-04-16 DIAGNOSIS — E0500 Thyrotoxicosis with diffuse goiter without thyrotoxic crisis or storm: Secondary | ICD-10-CM | POA: Diagnosis present

## 2016-04-16 DIAGNOSIS — M1990 Unspecified osteoarthritis, unspecified site: Secondary | ICD-10-CM | POA: Diagnosis present

## 2016-04-16 DIAGNOSIS — Z91048 Other nonmedicinal substance allergy status: Secondary | ICD-10-CM

## 2016-04-16 DIAGNOSIS — Z9884 Bariatric surgery status: Secondary | ICD-10-CM

## 2016-04-16 DIAGNOSIS — W19XXXA Unspecified fall, initial encounter: Secondary | ICD-10-CM | POA: Diagnosis present

## 2016-04-16 DIAGNOSIS — K219 Gastro-esophageal reflux disease without esophagitis: Secondary | ICD-10-CM | POA: Diagnosis present

## 2016-04-16 DIAGNOSIS — S0281XA Fracture of other specified skull and facial bones, right side, initial encounter for closed fracture: Secondary | ICD-10-CM | POA: Diagnosis present

## 2016-04-16 DIAGNOSIS — K220 Achalasia of cardia: Secondary | ICD-10-CM | POA: Diagnosis present

## 2016-04-16 DIAGNOSIS — M3500 Sicca syndrome, unspecified: Secondary | ICD-10-CM | POA: Diagnosis present

## 2016-04-16 DIAGNOSIS — M797 Fibromyalgia: Secondary | ICD-10-CM

## 2016-04-16 DIAGNOSIS — S02402A Zygomatic fracture, unspecified, initial encounter for closed fracture: Secondary | ICD-10-CM

## 2016-04-16 DIAGNOSIS — S0292XA Unspecified fracture of facial bones, initial encounter for closed fracture: Secondary | ICD-10-CM | POA: Diagnosis present

## 2016-04-16 DIAGNOSIS — W1809XA Striking against other object with subsequent fall, initial encounter: Secondary | ICD-10-CM | POA: Diagnosis present

## 2016-04-16 HISTORY — DX: Traumatic subarachnoid hemorrhage with loss of consciousness of 1 hour to 5 hours 59 minutes, initial encounter: S06.6X3A

## 2016-04-16 LAB — CBC WITH DIFFERENTIAL/PLATELET
Basophils Absolute: 0 10*3/uL (ref 0.0–0.1)
Basophils Relative: 1 %
Eosinophils Absolute: 0.2 10*3/uL (ref 0.0–0.7)
Eosinophils Relative: 5 %
HCT: 37.3 % (ref 36.0–46.0)
Hemoglobin: 11.9 g/dL — ABNORMAL LOW (ref 12.0–15.0)
Lymphocytes Relative: 33 %
Lymphs Abs: 1.4 10*3/uL (ref 0.7–4.0)
MCH: 29.8 pg (ref 26.0–34.0)
MCHC: 31.9 g/dL (ref 30.0–36.0)
MCV: 93.3 fL (ref 78.0–100.0)
Monocytes Absolute: 0.2 10*3/uL (ref 0.1–1.0)
Monocytes Relative: 5 %
Neutro Abs: 2.5 10*3/uL (ref 1.7–7.7)
Neutrophils Relative %: 56 %
Platelets: 161 10*3/uL (ref 150–400)
RBC: 4 MIL/uL (ref 3.87–5.11)
RDW: 12.9 % (ref 11.5–15.5)
WBC: 4.4 10*3/uL (ref 4.0–10.5)

## 2016-04-16 LAB — COMPREHENSIVE METABOLIC PANEL
ALT: 8 U/L — ABNORMAL LOW (ref 14–54)
AST: 20 U/L (ref 15–41)
Albumin: 3.7 g/dL (ref 3.5–5.0)
Alkaline Phosphatase: 89 U/L (ref 38–126)
Anion gap: 9 (ref 5–15)
BUN: 11 mg/dL (ref 6–20)
CO2: 27 mmol/L (ref 22–32)
Calcium: 9.3 mg/dL (ref 8.9–10.3)
Chloride: 103 mmol/L (ref 101–111)
Creatinine, Ser: 1.28 mg/dL — ABNORMAL HIGH (ref 0.44–1.00)
GFR calc Af Amer: 48 mL/min — ABNORMAL LOW (ref >60)
GFR calc non Af Amer: 41 mL/min — ABNORMAL LOW (ref >60)
Glucose, Bld: 112 mg/dL — ABNORMAL HIGH (ref 65–99)
Potassium: 4.2 mmol/L (ref 3.5–5.1)
Sodium: 139 mmol/L (ref 135–145)
Total Bilirubin: 0.9 mg/dL (ref 0.3–1.2)
Total Protein: 5.9 g/dL — ABNORMAL LOW (ref 6.5–8.1)

## 2016-04-16 MED ORDER — ROPINIROLE HCL 1 MG PO TABS
1.0000 mg | ORAL_TABLET | Freq: Every evening | ORAL | Status: DC | PRN
  Filled 2016-04-16: qty 1

## 2016-04-16 MED ORDER — MORPHINE SULFATE (PF) 4 MG/ML IV SOLN
4.0000 mg | Freq: Once | INTRAVENOUS | Status: AC
  Administered 2016-04-16: 4 mg via INTRAVENOUS
  Filled 2016-04-16: qty 1

## 2016-04-16 MED ORDER — SODIUM CHLORIDE 0.9 % IV SOLN
INTRAVENOUS | Status: DC
  Administered 2016-04-16 – 2016-04-18 (×3): via INTRAVENOUS
  Administered 2016-04-20: 50 mL/h via INTRAVENOUS

## 2016-04-16 MED ORDER — ONDANSETRON HCL 4 MG/2ML IJ SOLN
4.0000 mg | Freq: Four times a day (QID) | INTRAMUSCULAR | Status: DC | PRN
  Administered 2016-04-16: 4 mg via INTRAVENOUS
  Filled 2016-04-16: qty 2

## 2016-04-16 MED ORDER — ALPRAZOLAM 0.25 MG PO TABS: 0.2500 mg | ORAL_TABLET | Freq: Two times a day (BID) | ORAL | Status: DC | PRN

## 2016-04-16 MED ORDER — CYANOCOBALAMIN 1000 MCG/ML IJ SOLN: 1000.0000 ug | INTRAMUSCULAR | Status: DC

## 2016-04-16 MED ORDER — OXYMORPHONE HCL ER 10 MG PO TB12: 10.0000 mg | ORAL_TABLET | Freq: Four times a day (QID) | ORAL | Status: DC | PRN

## 2016-04-16 MED ORDER — LEVOTHYROXINE SODIUM 25 MCG PO TABS
125.0000 ug | ORAL_TABLET | Freq: Every day | ORAL | Status: DC
  Administered 2016-04-17 – 2016-04-20 (×3): 125 ug via ORAL
  Filled 2016-04-16 (×3): qty 1

## 2016-04-16 MED ORDER — MORPHINE SULFATE ER 30 MG PO TBCR
30.0000 mg | EXTENDED_RELEASE_TABLET | Freq: Two times a day (BID) | ORAL | Status: DC
  Administered 2016-04-16 – 2016-04-20 (×7): 30 mg via ORAL
  Filled 2016-04-16 (×7): qty 1

## 2016-04-16 MED ORDER — LAMOTRIGINE 100 MG PO TABS
300.0000 mg | ORAL_TABLET | Freq: Every day | ORAL | Status: DC
  Administered 2016-04-16 – 2016-04-19 (×4): 300 mg via ORAL
  Filled 2016-04-16 (×4): qty 3

## 2016-04-16 MED ORDER — ONDANSETRON HCL 4 MG PO TABS: 4.0000 mg | ORAL_TABLET | Freq: Four times a day (QID) | ORAL | Status: DC | PRN

## 2016-04-16 MED ORDER — LORAZEPAM 1 MG PO TABS
1.0000 mg | ORAL_TABLET | Freq: Every day | ORAL | Status: DC
  Administered 2016-04-16 – 2016-04-19 (×4): 1 mg via ORAL
  Filled 2016-04-16 (×4): qty 1

## 2016-04-16 MED ORDER — HYDROMORPHONE HCL 1 MG/ML IJ SOLN
1.0000 mg | INTRAMUSCULAR | Status: DC | PRN
  Administered 2016-04-16 – 2016-04-20 (×4): 1 mg via INTRAVENOUS
  Administered 2016-04-20: 2 mg via INTRAVENOUS
  Administered 2016-04-20: 1 mg via INTRAVENOUS
  Administered 2016-04-20: 2 mg via INTRAVENOUS
  Filled 2016-04-16: qty 2
  Filled 2016-04-16: qty 1
  Filled 2016-04-16: qty 2
  Filled 2016-04-16 (×5): qty 1

## 2016-04-16 MED ORDER — MORPHINE SULFATE ER 15 MG PO TBCR: 30.0000 mg | EXTENDED_RELEASE_TABLET | Freq: Four times a day (QID) | ORAL | Status: DC | PRN

## 2016-04-16 MED ORDER — MORPHINE SULFATE 15 MG PO TABS
30.0000 mg | ORAL_TABLET | Freq: Four times a day (QID) | ORAL | Status: DC | PRN
  Administered 2016-04-17 – 2016-04-19 (×5): 30 mg via ORAL
  Filled 2016-04-16 (×5): qty 2

## 2016-04-16 MED ORDER — FAMOTIDINE 20 MG PO TABS
20.0000 mg | ORAL_TABLET | Freq: Every day | ORAL | Status: DC
  Administered 2016-04-17 – 2016-04-20 (×3): 20 mg via ORAL
  Filled 2016-04-16 (×3): qty 1

## 2016-04-16 MED ORDER — DULOXETINE HCL 60 MG PO CPEP
60.0000 mg | ORAL_CAPSULE | Freq: Two times a day (BID) | ORAL | Status: DC
  Administered 2016-04-16 – 2016-04-20 (×7): 60 mg via ORAL
  Filled 2016-04-16 (×7): qty 1

## 2016-04-16 MED ORDER — SODIUM CHLORIDE 0.9 % IV BOLUS (SEPSIS)
1000.0000 mL | Freq: Once | INTRAVENOUS | Status: AC
  Administered 2016-04-16: 1000 mL via INTRAVENOUS

## 2016-04-16 NOTE — ED Triage Notes (Signed)
Per [facility]: Patient to ED from doctor's office following unwitnessed fall when walking. Patient was found to be snoring on floor, confused x 10-15 minutes upon waking. Patient A&O x 4 at this time, states that she remembers falling when walking into doctor's office - states she "tripped and fell." C-collar in place. C/o R wrist pain - small deformity and hematoma noted. No other obvious injuries. Pupils equal, reactive. EMS VS: EKG unremarkable, HR 88 NSR, 178/94, RR 16, CBG 98.

## 2016-04-16 NOTE — ED Provider Notes (Signed)
[facility]-EMERGENCY DEPT Provider Note   CSN: [id number] Arrival date & time: 04/16/16  1505     History   Chief Complaint Chief Complaint  Patient presents with  . Fall    HPI Jillian Morris is a 70 y.o. female.  HPI  Patient with med history, as below, notably not on blood thinners, presents after a fall.  She was reportedly walking into a doctor's office, when she states her right foot caught on the carpet and she fell.  She landed on her face and right hand.  Nurses on scene reported she was initially unconscious, then became more arrousable, but was slow to respond and confused. She slowly regained normal mentation during EMS transport.  She does not remember anything after the fall until EMS had arrived.  She denies prodromal Cp, SOB, headache, light headedness, abdominal pain.  She now endorses 8/10 right wrist pain and 6/10 facial pain.  Past Medical History:  Diagnosis Date  . Acne rosacea   . Anemia    s/p gastric bypass, malabsorption  . Anxiety   . Arthritis   . B12 deficiency   . Chronic fatigue fibromyalgia syndrome   . Chronic low back pain   . Depression   . Fibromyalgia   . GERD (gastroesophageal reflux disease)   . Graves disease   . H/O hiatal hernia   . Hypothyroidism   . IBS (irritable bowel syndrome)   . Lymphedema   . Meralgia paraesthetica   . Sjogren's syndrome (HCC)   . Status post dilation of esophageal narrowing   . Stroke [facility](HCC)     Patient Active Problem List   Diagnosis Date Noted  . Achalasia 08/09/2013  . Stricture and stenosis of esophagus 03/30/2012  . Dysphagia, unspecified(787.20) 03/21/2012  . Special screening for malignant neoplasms, colon 03/21/2012  . Esophageal reflux 03/21/2012    Past Surgical History:  Procedure Laterality Date  . ABDOMINAL HYSTERECTOMY    . APPENDECTOMY    . BALLOON DILATION  03/30/2012   Procedure: BALLOON DILATION;  Surgeon: Louis Meckelobert D Kaplan, MD;  Location: [facility];  Service: Endoscopy;   Laterality: N/A;  . BOTOX INJECTION N/A 12/26/2014   Procedure: BOTOX INJECTION;  Surgeon: Louis Meckelobert D Kaplan, MD;  Location: [facility];  Service: Endoscopy;  Laterality: N/A;  . ENDOMETRIAL ABLATION     x 4  . ESOPHAGEAL MANOMETRY N/A 05/15/2013   Procedure: ESOPHAGEAL MANOMETRY (EM);  Surgeon: Louis Meckelobert D Kaplan, MD;  Location: [facility];  Service: Endoscopy;  Laterality: N/A;  . ESOPHAGOGASTRODUODENOSCOPY  03/30/2012   Procedure: ESOPHAGOGASTRODUODENOSCOPY (EGD);  Surgeon: Louis Meckelobert D Kaplan, MD;  [facility];  Service: Endoscopy;  Laterality: N/A;  . ESOPHAGOGASTRODUODENOSCOPY  05/04/2012   Procedure: ESOPHAGOGASTRODUODENOSCOPY (EGD);  Surgeon: Louis Meckelobert D Kaplan, MD;  [facility];  Service: Endoscopy;  Laterality: N/A;  . ESOPHAGOGASTRODUODENOSCOPY N/A 12/26/2014   Procedure: ESOPHAGOGASTRODUODENOSCOPY (EGD);  Surgeon: Louis Meckelobert D Kaplan, MD;  [facility];  Service: Endoscopy;  Laterality: N/A;  botox injection  . GASTRIC BYPASS    . LAMINECTOMY     L3-L5  . 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 THYROIDECTOMY      OB History    No data available       Home Medications    Prior to Admission medications   Medication Sig Start Date End Date Taking? Authorizing Provider  ALPRAZolam Prudy Feeler(XANAX) 0.25 MG tablet  03/04/16   Historical Provider, MD  cyanocobalamin (,VITAMIN B-12,) 1000 MCG/ML injection Inject into the  muscle.    Historical Provider, MD  dexlansoprazole (DEXILANT) 60 MG capsule TAKE 1 CAPSULE (60 MG TOTAL) BY MOUTH DAILY. 11/21/13   Louis Meckel, MD  DULoxetine (CYMBALTA) 60 MG capsule TAKE 2 CAPSULES BY MOUTH EVERY EVENING 12/14/15   Historical Provider, MD  LamoTRIgine (LAMICTAL ODT) 50 MG TBDP Take 1 tablet by mouth 3 (three) times daily.    Historical Provider, MD  Levothyroxine Sodium (TIROSINT PO) Take 100 mcg by mouth daily.     Historical Provider, MD  lidocaine (LIDODERM) 5 % Place 1 patch onto the skin daily. Remove & Discard patch within 12  hours or as directed by MD    Historical Provider, MD  LORazepam (ATIVAN) 1 MG tablet Take 1 mg by mouth 3 (three) times daily as needed.    Historical Provider, MD  metroNIDAZOLE (METROCREAM) 0.75 % cream  10/06/10   Historical Provider, MD  mupirocin ointment (BACTROBAN) 2 % Apply 1 application topically 3 (three) times daily.    Historical Provider, MD  omeprazole (PRILOSEC) 40 MG capsule TAKE ONE CAPSULE BY MOUTH EVERY DAY 12/17/14   Louis Meckel, MD  OPANA ER, CRUSH RESISTANT, 10 MG T12A 12 hr tablet 1 TABLET BY MOUTH EVERY TWELVE HOURS 02/11/16   Historical Provider, MD  ranitidine (ZANTAC) 150 MG capsule TAKE 1 CAPSULE AT BEDTIME ONCE A DAY ORALLY 30 DAY(S) 01/07/16   Historical Provider, MD  rOPINIRole (REQUIP) 1 MG tablet 1 TABLET 1 TO 3 HOURS BEFORE BEDTIME ONCE A DAY ORALLY 30 DAY(S) 02/22/16   Historical Provider, MD  SYNTHROID 125 MCG tablet TAKE 1 TABLET BY MOUTH EVERY MORNING ON EMPTY STOMACH ONCE DAILY 02/17/16   Historical Provider, MD    Family History Family History  Problem Relation Age of Onset  . Prostate cancer Maternal Grandfather   . Lymphoma Maternal Uncle   . Heart disease Father   . Heart disease Sister   . Irritable bowel syndrome Sister   . Dementia Mother   . Stroke Maternal Grandmother   . Appendicitis Paternal Grandmother   . Colon cancer Neg Hx     Social History Social History  Substance Use Topics  . Smoking status: Never Smoker  . Smokeless tobacco: Never Used  . Alcohol use No     Allergies   Actonel [risedronate sodium]; Ambien [zolpidem tartrate]; Augmentin [amoxicillin-pot clavulanate]; Depakote [divalproex sodium]; Inderal [propranolol]; Latex; Nickel; Nsaids; Penicillins; and Silicon   Review of Systems Review of Systems  Constitutional: Negative for chills and fever.  HENT: Positive for facial swelling. Negative for ear pain and sore throat.   Eyes: Negative for pain and visual disturbance.  Respiratory: Negative for cough and  shortness of breath.   Cardiovascular: Negative for chest pain and palpitations.  Gastrointestinal: Negative for abdominal pain and vomiting.  Genitourinary: Negative for dysuria and hematuria.  Musculoskeletal: Positive for arthralgias and back pain.  Skin: Negative for color change and rash.  Neurological: Negative for seizures and syncope.  All other systems reviewed and are negative.    Physical Exam Updated Vital Signs There were no vitals taken for this visit.  Physical Exam  Constitutional: She appears well-developed and well-nourished. No distress.  HENT:  Head: Normocephalic.  Ecchymosis to right face, nasal deformity, no nasal septal hematoma, scattered facial abrasions  Eyes: Conjunctivae are normal.  Neck:  C-collar in place, no c-spine tenderness  Cardiovascular: Normal rate and regular rhythm.   No murmur heard. Pulmonary/Chest: Effort normal and breath sounds normal. No respiratory distress.  Abdominal: Soft. There is no tenderness.  Musculoskeletal: She exhibits no edema.  Mild left pelvic TTP. Mid t spine TTP without deformity. No chest tenderness.  R wrist: Inspection: slight dorsal deformity with effusion and tenderness ROM: lmited with pain Strength: 3/5 in flexion and 3/5 in extension Pulses: distal pulses intact Sensation: distal sensation intact   Neurological: She is alert.  Skin: Skin is warm and dry.  Psychiatric: She has a normal mood and affect.  Nursing note and vitals reviewed.    ED Treatments / Results  Labs (all labs ordered are listed, but only abnormal results are displayed) Labs Reviewed - No data to display  EKG  EKG Interpretation None       Radiology No results found.  Procedures Procedures (including critical care time)  Medications Ordered in ED Medications - No data to display   Initial Impression / Assessment and Plan / ED Course  I have reviewed the triage vital signs and the nursing notes.  Pertinent  labs & imaging results that were available during my care of the patient were reviewed by me and considered in my medical decision making (see chart for details).  Clinical Course    Patient with a fall, that sounds mechanical in nature (no prodromal symptoms), with resulting LOC.  Imaging ordered based on patient's physical exam:  CXR: No pneumo PXR: No fracture CT Head: Small SAH CT Face: Multiple sinus and orbital fractures, no rectus intrapment Ct c-spine: chronic degenerative changes XR T-spine: Negative XR R wrist: Impacted distal radius XR R elbow: Negative  Labs assessed and showed mild Cr increase, so fluids were given.  Unremarkable otherwise.  Sugar tong splint ordered.  Arm is neurovascularly intact.  Neurosurgery, orthopedics, ENT, and trauma consulted.  Patient to be admitted to trauma.  Final Clinical Impressions(s) / ED Diagnoses   Final diagnoses:  None    New Prescriptions New Prescriptions   No medications on file     Marcelina MorelMichael Luz Burcher, MD 04/16/16 2007    Melene Planan Floyd, DO 04/16/16 2010

## 2016-04-16 NOTE — Consult Note (Signed)
Reason for Consult:Fall with [facility] Referring Physician: Shanavia Makela is an 71 y.o. female.  HPI: Patient with med history, as below, notably not on blood thinners, presents after a fall.  She was reportedly walking into Doctor Digby's office to have her cataracts checked, when she states her right foot caught on the carpet and she fell.  She landed on her face and right hand.  Nurses on scene reported she was initially unconscious, then became more arrousable, but was slow to respond and confused. She slowly regained normal mentation during EMS transport.  She does not remember anything after the fall until EMS had arrived.  She denies prodromal Cp, SOB, headache, light headedness, abdominal pain. She is complaining of double vision and pain in her face and right wrist.   Past Medical History:  Diagnosis Date  . Acne rosacea   . Anemia    s/p gastric bypass, malabsorption  . Anxiety   . Arthritis   . B12 deficiency   . Chronic fatigue fibromyalgia syndrome   . Chronic low back pain   . Depression   . Fibromyalgia   . GERD (gastroesophageal reflux disease)   . Graves disease   . H/O hiatal hernia   . Hypothyroidism   . IBS (irritable bowel syndrome)   . Lymphedema   . Meralgia paraesthetica   . Sjogren's syndrome ([place])   . Status post dilation of esophageal narrowing   . Stroke [facility])     Past Surgical History:  Procedure Laterality Date  . ABDOMINAL HYSTERECTOMY    . APPENDECTOMY    . BALLOON DILATION  03/30/2012   Procedure: BALLOON DILATION;  Surgeon: Inda Castle, MD;  Location: [facility] ENDOSCOPY;  Service: Endoscopy;  Laterality: N/A;  . BOTOX INJECTION N/A 12/26/2014   Procedure: BOTOX INJECTION;  Surgeon: Inda Castle, MD;  Location: [facility] ENDOSCOPY;  Service: Endoscopy;  Laterality: N/A;  . ENDOMETRIAL ABLATION     x 4  . ESOPHAGEAL MANOMETRY N/A 05/15/2013   Procedure: ESOPHAGEAL MANOMETRY (EM);  Surgeon: Inda Castle, MD;  Location: [facility] ENDOSCOPY;  Service:  Endoscopy;  Laterality: N/A;  . ESOPHAGOGASTRODUODENOSCOPY  03/30/2012   Procedure: ESOPHAGOGASTRODUODENOSCOPY (EGD);  Surgeon: Inda Castle, MD;  Location: [facility];  Service: Endoscopy;  Laterality: N/A;  . ESOPHAGOGASTRODUODENOSCOPY  05/04/2012   Procedure: ESOPHAGOGASTRODUODENOSCOPY (EGD);  Surgeon: Inda Castle, MD;  Location: [facility];  Service: Endoscopy;  Laterality: N/A;  . ESOPHAGOGASTRODUODENOSCOPY N/A 12/26/2014   Procedure: ESOPHAGOGASTRODUODENOSCOPY (EGD);  Surgeon: Inda Castle, MD;  Location: [facility];  Service: Endoscopy;  Laterality: N/A;  botox injection  . GASTRIC BYPASS    . LAMINECTOMY     L3-L5  . 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 THYROIDECTOMY      Family History  Problem Relation Age of Onset  . Prostate cancer Maternal Grandfather   . Lymphoma Maternal Uncle   . Heart disease Father   . Heart disease Sister   . Irritable bowel syndrome Sister   . Dementia Mother   . Stroke Maternal Grandmother   . Appendicitis Paternal Grandmother   . Colon cancer Neg Hx     Social History:  reports that she has never smoked. She has never used smokeless tobacco. She reports that she does not drink alcohol or use drugs.  Allergies:  Allergies  Allergen Reactions  . Actonel [Risedronate Sodium]     GI Upset  . Ambien [Zolpidem Tartrate] Other (See Comments)  Went crazy, hallucinations  . Augmentin [Amoxicillin-Pot Clavulanate] Other (See Comments)    GI upset  . Depakote [Divalproex Sodium]     Hallucinations  . Inderal [Propranolol] Other (See Comments)    unknown  . Nsaids Other (See Comments)    Pt. Had gastric bypass surgery   . Penicillins Other (See Comments)    Unknown childhood reaction  . Silicon Nausea And Vomiting    Thyroid storm  . Adhesive [Tape] Itching and Rash    Please use "paper" tape  . Latex Swelling and Rash    Redness lips  . Nickel Itching and Rash    Other reaction(s): Contact Dermatitis (intolerance)     Medications: I have reviewed the patient's current medications.  Results for orders placed or performed during the hospital encounter of 04/16/16 (from the past 48 hour(s))  Comprehensive metabolic panel     Status: Abnormal   Collection Time: 04/16/16  3:36 PM  Result Value Ref Range   Sodium 139 135 - 145 mmol/L   Potassium 4.2 3.5 - 5.1 mmol/L   Chloride 103 101 - 111 mmol/L   CO2 27 22 - 32 mmol/L   Glucose, Bld 112 (H) 65 - 99 mg/dL   BUN 11 6 - 20 mg/dL   Creatinine, Ser 1.28 (H) 0.44 - 1.00 mg/dL   Calcium 9.3 8.9 - 10.3 mg/dL   Total Protein 5.9 (L) 6.5 - 8.1 g/dL   Albumin 3.7 3.5 - 5.0 g/dL   AST 20 15 - 41 U/L   ALT 8 (L) 14 - 54 U/L   Alkaline Phosphatase 89 38 - 126 U/L   Total Bilirubin 0.9 0.3 - 1.2 mg/dL   GFR calc non Af Amer 41 (L) >60 mL/min   GFR calc Af Amer 48 (L) >60 mL/min    Comment: (NOTE) The eGFR has been calculated using the CKD EPI equation. This calculation has not been validated in all clinical situations. eGFR's persistently <60 mL/min signify possible Chronic Kidney Disease.    Anion gap 9 5 - 15  CBC with Differential     Status: Abnormal   Collection Time: 04/16/16  3:36 PM  Result Value Ref Range   WBC 4.4 4.0 - 10.5 K/uL   RBC 4.00 3.87 - 5.11 MIL/uL   Hemoglobin 11.9 (L) 12.0 - 15.0 g/dL   HCT 37.3 36.0 - 46.0 %   MCV 93.3 78.0 - 100.0 fL   MCH 29.8 26.0 - 34.0 pg   MCHC 31.9 30.0 - 36.0 g/dL   RDW 12.9 11.5 - 15.5 %   Platelets 161 150 - 400 K/uL   Neutrophils Relative % 56 %   Neutro Abs 2.5 1.7 - 7.7 K/uL   Lymphocytes Relative 33 %   Lymphs Abs 1.4 0.7 - 4.0 K/uL   Monocytes Relative 5 %   Monocytes Absolute 0.2 0.1 - 1.0 K/uL   Eosinophils Relative 5 %   Eosinophils Absolute 0.2 0.0 - 0.7 K/uL   Basophils Relative 1 %   Basophils Absolute 0.0 0.0 - 0.1 K/uL    Dg Chest 1 View  Result Date: 04/16/2016 CLINICAL DATA:  Pt c/o bilateral upper back pain, generalized right wrist pain, swelling, and bruising,  generalized right elbow pain, and bilateral hip pain after experiencing an unwitnessed fall this afternoon. No known hx of prior injuries or surgeries. No hx of heart or lung problems. Pt is a nonsmoker. EXAM: CHEST 1 VIEW COMPARISON:  12/29/2010 FINDINGS: The cardiac silhouette is normal in  size and configuration. No mediastinal or hilar masses or evidence of adenopathy. Lungs are clear.  No pleural effusion or pneumothorax. Bony thorax is intact. IMPRESSION: No active disease. Electronically Signed   By: Lajean Manes M.D.   On: 04/16/2016 17:02   Dg Thoracic Spine 2 View  Result Date: 04/16/2016 CLINICAL DATA:  Pt c/o bilateral upper back pain, generalized right wrist pain, swelling, and bruising, generalized right elbow pain, and bilateral hip pain after experiencing an unwitnessed fall this afternoon. No known hx of prior injuries or surgeries. No hx of heart or lung problems. Pt is a nonsmoker. EXAM: THORACIC SPINE 2 VIEWS COMPARISON:  Chest CT, 02/18/2011 FINDINGS: No fracture.  No spondylolisthesis.  No bone lesion. Bones are demineralized. There are mild disc degenerative changes centered in the mid thoracic spine. Soft tissues are unremarkable. IMPRESSION: 1. No fracture, spondylolisthesis or acute finding. Electronically Signed   By: Lajean Manes M.D.   On: 04/16/2016 17:03   Dg Pelvis 1-2 Views  Result Date: 04/16/2016 CLINICAL DATA:  Bilateral upper back pain.  Status post fall. EXAM: PELVIS - 1-2 VIEW COMPARISON:  None. FINDINGS: Generalized osteopenia. No acute fracture or subluxation. 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 IMPRESSION: No acute osseous injury of the pelvis. Electronically Signed   By: Kathreen Devoid   On: 04/16/2016 16:59   Dg Elbow Complete Right  Result Date: 04/16/2016 CLINICAL DATA:  Pt c/o bilateral upper back pain, generalized right wrist pain, swelling, and bruising, generalized right elbow pain, and bilateral  hip pain after experiencing an unwitnessed fall this afternoon. No known hx of prior injuries or surgeries. No hx of heart or lung problems. Pt is a nonsmoker. EXAM: RIGHT ELBOW - COMPLETE 3+ VIEW COMPARISON:  None. FINDINGS: No fracture or dislocation. Elbow joint normally spaced and aligned.  No joint effusion. Soft tissues are unremarkable. IMPRESSION: No fracture or dislocation. Electronically Signed   By: Lajean Manes M.D.   On: 04/16/2016 16:58   Dg Wrist Complete Right  Result Date: 04/16/2016 CLINICAL DATA:  Pt c/o bilateral upper back pain, generalized right wrist pain, swelling, and bruising, generalized right elbow pain, and bilateral hip pain after experiencing an unwitnessed fall this afternoon. No known hx of prior injuries or surgeries. No hx of heart or lung problems. Pt is a nonsmoker. EXAM: RIGHT WRIST - COMPLETE 3+ VIEW COMPARISON:  None. FINDINGS: There is a comminuted fracture of the distal radius with an associated nondisplaced ulnar styloid fracture. The primary fracture is transverse across the metaphysis. There is a separate fracture component of the radial styloid and other fracture components intersecting the lunate facet than the dorsal aspect of the distal radial articular surface. Fracture is dorsally impacted leading to dorsal angulation of the distal radial articular surface of approximately 17 degrees. Bones are demineralized. There is no dislocation. There is diffuse surrounding soft tissue edema. IMPRESSION: 1. Comminuted, dorsally impacted fracture of the distal radial metaphysis with dorsal angulation of the distal radial articular surface. There is associated ulnar styloid fracture. No dislocation. Electronically Signed   By: Lajean Manes M.D.   On: 04/16/2016 17:05   Ct Head Wo Contrast  Result Date: 04/16/2016 CLINICAL DATA:  Pain after trauma EXAM: CT HEAD WITHOUT CONTRAST CT MAXILLOFACIAL WITHOUT CONTRAST CT CERVICAL SPINE WITHOUT CONTRAST TECHNIQUE: Multidetector  CT imaging of the head, cervical spine, and maxillofacial structures were performed using the standard protocol without intravenous contrast. Multiplanar CT image reconstructions of the  cervical spine and maxillofacial structures were also generated. COMPARISON:  September 03, 2014 FINDINGS: CT HEAD FINDINGS There are multiple fractures in the right side of the face which will be described on the maxillofacial portion of the study. There is opacification of the right maxillary sinus. The remainder of the paranasal sinuses are normal. The mastoid air cells and middle ears are normal. There is soft tissue swelling in the right side of the face. The globes are intact. The remainder of the extracranial soft tissues are normal. There is high attenuation in the left side of the quadrigeminal plate cistern on series 3, image 11, consistent with subarachnoid hemorrhage. There is high attenuation in the sulci of the medial right frontal lobe such as on series 3, image 13 and image 15. These findings are new since January 2016. No subdural or epidural hemorrhage. Cerebellum and brainstem are normal. Basal cisterns are patent. Ventricles and sulci are normal for age and contain no blood. No mass effect or midline shift. No cortical ischemia or infarct. CT MAXILLOFACIAL FINDINGS There are complicated fractures through the right facial bones. The roof and medial walls of the right orbit are intact. There are fractures through the lateral wall which are mildly displaced. Displaced fractures are also seen through the inferior wall of the right orbit. Blood and debris obscure the inferior rectus muscle but it appears to remain within the orbit with no evidence of entrapment. Displaced fractures are seen through the lateral wall of the right maxillary sinus extending into the floor. Anterior, posterior, lateral, inferior, and superior wall maxillary sinus fractures identified. The patient has 4 maxillary screws. The fractures through  the floor of the right maxillary sinus appear to disrupt the cement around at least 1 of these right sided screws. The medial wall of the right maxillary sinus is intact. The pterygoid plates remain intact. There is a depressed fracture through the right zygomatic arch anteriorly and a nondisplaced fracture through the posterior right zygomatic arch. The left orbit is intact as is the left zygomatic arch. The mandible is also intact. CT CERVICAL SPINE FINDINGS C5 demonstrates retrolisthesis versus both C4 and C6 with mild focal kyphosis. Retrolisthesis of C5 versus C4 is 2.5 mm which is similar to the retrolisthesis at C5-6. There is minimal anterolisthesis of T1 versus T2. No other malalignment. No fractures. Multilevel degenerative changes most marked at C5-6. Degenerative changes also seen with right greater than left C5-6 neural foraminal narrowing. No critical canal stenosis. IMPRESSION: 1. Subarachnoid hemorrhage in the sulci along the medial right frontal lobe and in the left side of the quadrigeminal plate cistern. 2. Complex fracture involving the lateral and inferior walls of the right orbit without evidence of entrapment of the inferior rectus muscle. There are fractures through the anterior, posterior superior, inferior and lateral walls of the right maxillary sinus. The maxillary fractures affect at least 1 of the 2 maxillary screws. Right zygomatic arch fractures are noted. 3. The malalignment in the cervical spine spine described above is favored to be degenerative with no identified fracture or soft tissue swelling. Findings called to Dr. Tyrone Nine Electronically Signed   By: Dorise Bullion III M.D   On: 04/16/2016 16:43   Ct Cervical Spine Wo Contrast  Result Date: 04/16/2016 CLINICAL DATA:  Pain after trauma EXAM: CT HEAD WITHOUT CONTRAST CT MAXILLOFACIAL WITHOUT CONTRAST CT CERVICAL SPINE WITHOUT CONTRAST TECHNIQUE: Multidetector CT imaging of the head, cervical spine, and maxillofacial  structures were performed using the standard protocol without intravenous  contrast. Multiplanar CT image reconstructions of the cervical spine and maxillofacial structures were also generated. COMPARISON:  September 03, 2014 FINDINGS: CT HEAD FINDINGS There are multiple fractures in the right side of the face which will be described on the maxillofacial portion of the study. There is opacification of the right maxillary sinus. The remainder of the paranasal sinuses are normal. The mastoid air cells and middle ears are normal. There is soft tissue swelling in the right side of the face. The globes are intact. The remainder of the extracranial soft tissues are normal. There is high attenuation in the left side of the quadrigeminal plate cistern on series 3, image 11, consistent with subarachnoid hemorrhage. There is high attenuation in the sulci of the medial right frontal lobe such as on series 3, image 13 and image 15. These findings are new since January 2016. No subdural or epidural hemorrhage. Cerebellum and brainstem are normal. Basal cisterns are patent. Ventricles and sulci are normal for age and contain no blood. No mass effect or midline shift. No cortical ischemia or infarct. CT MAXILLOFACIAL FINDINGS There are complicated fractures through the right facial bones. The roof and medial walls of the right orbit are intact. There are fractures through the lateral wall which are mildly displaced. Displaced fractures are also seen through the inferior wall of the right orbit. Blood and debris obscure the inferior rectus muscle but it appears to remain within the orbit with no evidence of entrapment. Displaced fractures are seen through the lateral wall of the right maxillary sinus extending into the floor. Anterior, posterior, lateral, inferior, and superior wall maxillary sinus fractures identified. The patient has 4 maxillary screws. The fractures through the floor of the right maxillary sinus appear to disrupt  the cement around at least 1 of these right sided screws. The medial wall of the right maxillary sinus is intact. The pterygoid plates remain intact. There is a depressed fracture through the right zygomatic arch anteriorly and a nondisplaced fracture through the posterior right zygomatic arch. The left orbit is intact as is the left zygomatic arch. The mandible is also intact. CT CERVICAL SPINE FINDINGS C5 demonstrates retrolisthesis versus both C4 and C6 with mild focal kyphosis. Retrolisthesis of C5 versus C4 is 2.5 mm which is similar to the retrolisthesis at C5-6. There is minimal anterolisthesis of T1 versus T2. No other malalignment. No fractures. Multilevel degenerative changes most marked at C5-6. Degenerative changes also seen with right greater than left C5-6 neural foraminal narrowing. No critical canal stenosis. IMPRESSION: 1. Subarachnoid hemorrhage in the sulci along the medial right frontal lobe and in the left side of the quadrigeminal plate cistern. 2. Complex fracture involving the lateral and inferior walls of the right orbit without evidence of entrapment of the inferior rectus muscle. There are fractures through the anterior, posterior superior, inferior and lateral walls of the right maxillary sinus. The maxillary fractures affect at least 1 of the 2 maxillary screws. Right zygomatic arch fractures are noted. 3. The malalignment in the cervical spine spine described above is favored to be degenerative with no identified fracture or soft tissue swelling. Findings called to Dr. Tyrone Nine Electronically Signed   By: Dorise Bullion III M.D   On: 04/16/2016 16:43   Ct Maxillofacial Wo Cm  Result Date: 04/16/2016 CLINICAL DATA:  Pain after trauma EXAM: CT HEAD WITHOUT CONTRAST CT MAXILLOFACIAL WITHOUT CONTRAST CT CERVICAL SPINE WITHOUT CONTRAST TECHNIQUE: Multidetector CT imaging of the head, cervical spine, and maxillofacial structures were performed using  the standard protocol without intravenous  contrast. Multiplanar CT image reconstructions of the cervical spine and maxillofacial structures were also generated. COMPARISON:  September 03, 2014 FINDINGS: CT HEAD FINDINGS There are multiple fractures in the right side of the face which will be described on the maxillofacial portion of the study. There is opacification of the right maxillary sinus. The remainder of the paranasal sinuses are normal. The mastoid air cells and middle ears are normal. There is soft tissue swelling in the right side of the face. The globes are intact. The remainder of the extracranial soft tissues are normal. There is high attenuation in the left side of the quadrigeminal plate cistern on series 3, image 11, consistent with subarachnoid hemorrhage. There is high attenuation in the sulci of the medial right frontal lobe such as on series 3, image 13 and image 15. These findings are new since January 2016. No subdural or epidural hemorrhage. Cerebellum and brainstem are normal. Basal cisterns are patent. Ventricles and sulci are normal for age and contain no blood. No mass effect or midline shift. No cortical ischemia or infarct. CT MAXILLOFACIAL FINDINGS There are complicated fractures through the right facial bones. The roof and medial walls of the right orbit are intact. There are fractures through the lateral wall which are mildly displaced. Displaced fractures are also seen through the inferior wall of the right orbit. Blood and debris obscure the inferior rectus muscle but it appears to remain within the orbit with no evidence of entrapment. Displaced fractures are seen through the lateral wall of the right maxillary sinus extending into the floor. Anterior, posterior, lateral, inferior, and superior wall maxillary sinus fractures identified. The patient has 4 maxillary screws. The fractures through the floor of the right maxillary sinus appear to disrupt the cement around at least 1 of these right sided screws. The medial wall  of the right maxillary sinus is intact. The pterygoid plates remain intact. There is a depressed fracture through the right zygomatic arch anteriorly and a nondisplaced fracture through the posterior right zygomatic arch. The left orbit is intact as is the left zygomatic arch. The mandible is also intact. CT CERVICAL SPINE FINDINGS C5 demonstrates retrolisthesis versus both C4 and C6 with mild focal kyphosis. Retrolisthesis of C5 versus C4 is 2.5 mm which is similar to the retrolisthesis at C5-6. There is minimal anterolisthesis of T1 versus T2. No other malalignment. No fractures. Multilevel degenerative changes most marked at C5-6. Degenerative changes also seen with right greater than left C5-6 neural foraminal narrowing. No critical canal stenosis. IMPRESSION: 1. Subarachnoid hemorrhage in the sulci along the medial right frontal lobe and in the left side of the quadrigeminal plate cistern. 2. Complex fracture involving the lateral and inferior walls of the right orbit without evidence of entrapment of the inferior rectus muscle. There are fractures through the anterior, posterior superior, inferior and lateral walls of the right maxillary sinus. The maxillary fractures affect at least 1 of the 2 maxillary screws. Right zygomatic arch fractures are noted. 3. The malalignment in the cervical spine spine described above is favored to be degenerative with no identified fracture or soft tissue swelling. Findings called to Dr. Tyrone Nine Electronically Signed   By: Dorise Bullion III M.D   On: 04/16/2016 16:43    Review of Systems -  Constitutional: Negative for chills and fever.  HENT: Positive for facial swelling. Negative for ear pain and sore throat.   Eyes: Negative for pain and visual disturbance.  Respiratory: Negative  for cough and shortness of breath.   Cardiovascular: Negative for chest pain and palpitations.  Gastrointestinal: Negative for abdominal pain and vomiting.  Genitourinary: Negative for  dysuria and hematuria.  Musculoskeletal: Positive for arthralgias and back pain.  Skin: Negative for color change and rash.  Neurological: Negative for seizures and syncope.  All other systems reviewed and are negative.  Blood pressure 138/100, pulse 85, resp. rate 12, SpO2 99 %. Physical Exam  Constitutional: She is oriented to person, place, and time. She appears well-developed and well-nourished.  HENT:  Head: Head is with raccoon's eyes and with right periorbital erythema.    Eyes: Right conjunctiva has a hemorrhage. Right eye exhibits abnormal extraocular motion.  Poor upgaze on right and laterally with diplopia, worse on upgaze than left lateral position  Neck: Normal range of motion. Neck supple.  Musculoskeletal:       Right wrist: She exhibits decreased range of motion, tenderness, bony tenderness and swelling.  Neurological: She is alert and oriented to person, place, and time. She has normal strength and normal reflexes. A cranial nerve deficit is present. No sensory deficit. GCS eye subscore is 4. GCS verbal subscore is 5. GCS motor subscore is 6.    Assessment/Plan: Patient has right orbital fractures, dysconjugate gaze, diplopia worse on upgaze and left lateral gaze, right radial fracture and small amount of intracranial subarachnoid blood.  She will need Trauma, Ophthy, Ortho evaluuation and Q 2 H neuro checks.  Repeat Head CT in AM.  Mobilize as tolerated with PT.    Peggyann Shoals, MD 04/16/2016, 6:15 PM

## 2016-04-16 NOTE — H&P (Signed)
History   Jillian Morris is an 70 y.o. female.   Chief Complaint:  Chief Complaint  Patient presents with  . Fall    HPI   Patient with a ground level fall in the hallway of her ophthalmologist office.  Brief LOC of minutes.  Complaining of face pain, right wrist pain, and has chronic back pain.  Injuries noted are right distal radius fracture assessed by Dr. Fredna Dow, [facility] assessed by Dr. Vertell Limber.  She has an orbital fracture that needs to be assess by maxillofacial surgery.  They have not seen the patient.  Questionable entrapment noted by Dr. Vertell Limber  Past Medical History:  Diagnosis Date  . Acne rosacea   . Anemia    s/p gastric bypass, malabsorption  . Anxiety   . Arthritis   . B12 deficiency   . Chronic fatigue fibromyalgia syndrome   . Chronic low back pain   . Depression   . Fibromyalgia   . GERD (gastroesophageal reflux disease)   . Graves disease   . H/O hiatal hernia   . Hypothyroidism   . IBS (irritable bowel syndrome)   . Lymphedema   . Meralgia paraesthetica   . Sjogren's syndrome ([place])   . Status post dilation of esophageal narrowing   . Stroke [facility])     Past Surgical History:  Procedure Laterality Date  . ABDOMINAL HYSTERECTOMY    . APPENDECTOMY    . BALLOON DILATION  03/30/2012   Procedure: BALLOON DILATION;  Surgeon: Inda Castle, MD;  Location: [facility] ENDOSCOPY;  Service: Endoscopy;  Laterality: N/A;  . BOTOX INJECTION N/A 12/26/2014   Procedure: BOTOX INJECTION;  Surgeon: Inda Castle, MD;  Location: [facility];  Service: Endoscopy;  Laterality: N/A;  . ENDOMETRIAL ABLATION     x 4  . ESOPHAGEAL MANOMETRY N/A 05/15/2013   Procedure: ESOPHAGEAL MANOMETRY (EM);  Surgeon: Inda Castle, MD;  Location: [facility];  Service: Endoscopy;  Laterality: N/A;  . ESOPHAGOGASTRODUODENOSCOPY  03/30/2012   Procedure: ESOPHAGOGASTRODUODENOSCOPY (EGD);  Surgeon: Inda Castle, MD;  Location: [facility];  Service: Endoscopy;  Laterality: N/A;  .  ESOPHAGOGASTRODUODENOSCOPY  05/04/2012   Procedure: ESOPHAGOGASTRODUODENOSCOPY (EGD);  Surgeon: Inda Castle, MD;  Location: [facility];  Service: Endoscopy;  Laterality: N/A;  . ESOPHAGOGASTRODUODENOSCOPY N/A 12/26/2014   Procedure: ESOPHAGOGASTRODUODENOSCOPY (EGD);  Surgeon: Inda Castle, MD;  Location: [facility];  Service: Endoscopy;  Laterality: N/A;  botox injection  . GASTRIC BYPASS    . LAMINECTOMY     L3-L5  . 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 THYROIDECTOMY      Family History  Problem Relation Age of Onset  . Prostate cancer Maternal Grandfather   . Lymphoma Maternal Uncle   . Heart disease Father   . Heart disease Sister   . Irritable bowel syndrome Sister   . Dementia Mother   . Stroke Maternal Grandmother   . Appendicitis Paternal Grandmother   . Colon cancer Neg Hx    Social History:  reports that she has never smoked. She has never used smokeless tobacco. She reports that she does not drink alcohol or use drugs.  Allergies   Allergies  Allergen Reactions  . Actonel [Risedronate Sodium]     GI Upset  . Ambien [Zolpidem Tartrate] Other (See Comments)    Went crazy, hallucinations  . Augmentin [Amoxicillin-Pot Clavulanate] Other (See Comments)    GI upset  . Depakote [Divalproex Sodium]     Hallucinations  . Inderal [  Propranolol] Other (See Comments)    unknown  . Nsaids Other (See Comments)    Pt. Had gastric bypass surgery   . Penicillins Other (See Comments)    Unknown childhood reaction  . Silicon Nausea And Vomiting    Thyroid storm  . Adhesive [Tape] Itching and Rash    Please use "paper" tape  . Latex Swelling and Rash    Redness lips  . Nickel Itching and Rash    Other reaction(s): Contact Dermatitis (intolerance)    Home Medications   (Not in a hospital admission)  Trauma Course   Results for orders placed or performed during the hospital encounter of 04/16/16 (from the past 48 hour(s))  Comprehensive metabolic panel      Status: Abnormal   Collection Time: 04/16/16  3:36 PM  Result Value Ref Range   Sodium 139 135 - 145 mmol/L   Potassium 4.2 3.5 - 5.1 mmol/L   Chloride 103 101 - 111 mmol/L   CO2 27 22 - 32 mmol/L   Glucose, Bld 112 (H) 65 - 99 mg/dL   BUN 11 6 - 20 mg/dL   Creatinine, Ser 1.28 (H) 0.44 - 1.00 mg/dL   Calcium 9.3 8.9 - 10.3 mg/dL   Total Protein 5.9 (L) 6.5 - 8.1 g/dL   Albumin 3.7 3.5 - 5.0 g/dL   AST 20 15 - 41 U/L   ALT 8 (L) 14 - 54 U/L   Alkaline Phosphatase 89 38 - 126 U/L   Total Bilirubin 0.9 0.3 - 1.2 mg/dL   GFR calc non Af Amer 41 (L) >60 mL/min   GFR calc Af Amer 48 (L) >60 mL/min    Comment: (NOTE) The eGFR has been calculated using the CKD EPI equation. This calculation has not been validated in all clinical situations. eGFR's persistently <60 mL/min signify possible Chronic Kidney Disease.    Anion gap 9 5 - 15  CBC with Differential     Status: Abnormal   Collection Time: 04/16/16  3:36 PM  Result Value Ref Range   WBC 4.4 4.0 - 10.5 K/uL   RBC 4.00 3.87 - 5.11 MIL/uL   Hemoglobin 11.9 (L) 12.0 - 15.0 g/dL   HCT 37.3 36.0 - 46.0 %   MCV 93.3 78.0 - 100.0 fL   MCH 29.8 26.0 - 34.0 pg   MCHC 31.9 30.0 - 36.0 g/dL   RDW 12.9 11.5 - 15.5 %   Platelets 161 150 - 400 K/uL   Neutrophils Relative % 56 %   Neutro Abs 2.5 1.7 - 7.7 K/uL   Lymphocytes Relative 33 %   Lymphs Abs 1.4 0.7 - 4.0 K/uL   Monocytes Relative 5 %   Monocytes Absolute 0.2 0.1 - 1.0 K/uL   Eosinophils Relative 5 %   Eosinophils Absolute 0.2 0.0 - 0.7 K/uL   Basophils Relative 1 %   Basophils Absolute 0.0 0.0 - 0.1 K/uL   Dg Chest 1 View  Result Date: 04/16/2016 CLINICAL DATA:  Pt c/o bilateral upper back pain, generalized right wrist pain, swelling, and bruising, generalized right elbow pain, and bilateral hip pain after experiencing an unwitnessed fall this afternoon. No known hx of prior injuries or surgeries. No hx of heart or lung problems. Pt is a nonsmoker. EXAM: CHEST 1 VIEW  COMPARISON:  12/29/2010 FINDINGS: The cardiac silhouette is normal in size and configuration. No mediastinal or hilar masses or evidence of adenopathy. Lungs are clear.  No pleural effusion or pneumothorax. Bony thorax is intact.  IMPRESSION: No active disease. Electronically Signed   By: Lajean Manes M.D.   On: 04/16/2016 17:02   Dg Thoracic Spine 2 View  Result Date: 04/16/2016 CLINICAL DATA:  Pt c/o bilateral upper back pain, generalized right wrist pain, swelling, and bruising, generalized right elbow pain, and bilateral hip pain after experiencing an unwitnessed fall this afternoon. No known hx of prior injuries or surgeries. No hx of heart or lung problems. Pt is a nonsmoker. EXAM: THORACIC SPINE 2 VIEWS COMPARISON:  Chest CT, 02/18/2011 FINDINGS: No fracture.  No spondylolisthesis.  No bone lesion. Bones are demineralized. There are mild disc degenerative changes centered in the mid thoracic spine. Soft tissues are unremarkable. IMPRESSION: 1. No fracture, spondylolisthesis or acute finding. Electronically Signed   By: Lajean Manes M.D.   On: 04/16/2016 17:03   Dg Pelvis 1-2 Views  Result Date: 04/16/2016 CLINICAL DATA:  Bilateral upper back pain.  Status post fall. EXAM: PELVIS - 1-2 VIEW COMPARISON:  None. FINDINGS: Generalized osteopenia. No acute fracture or subluxation. 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 IMPRESSION: No acute osseous injury of the pelvis. Electronically Signed   By: Kathreen Devoid   On: 04/16/2016 16:59   Dg Elbow Complete Right  Result Date: 04/16/2016 CLINICAL DATA:  Pt c/o bilateral upper back pain, generalized right wrist pain, swelling, and bruising, generalized right elbow pain, and bilateral hip pain after experiencing an unwitnessed fall this afternoon. No known hx of prior injuries or surgeries. No hx of heart or lung problems. Pt is a nonsmoker. EXAM: RIGHT ELBOW - COMPLETE 3+ VIEW COMPARISON:  None.  FINDINGS: No fracture or dislocation. Elbow joint normally spaced and aligned.  No joint effusion. Soft tissues are unremarkable. IMPRESSION: No fracture or dislocation. Electronically Signed   By: Lajean Manes M.D.   On: 04/16/2016 16:58   Dg Wrist Complete Right  Result Date: 04/16/2016 CLINICAL DATA:  Pt c/o bilateral upper back pain, generalized right wrist pain, swelling, and bruising, generalized right elbow pain, and bilateral hip pain after experiencing an unwitnessed fall this afternoon. No known hx of prior injuries or surgeries. No hx of heart or lung problems. Pt is a nonsmoker. EXAM: RIGHT WRIST - COMPLETE 3+ VIEW COMPARISON:  None. FINDINGS: There is a comminuted fracture of the distal radius with an associated nondisplaced ulnar styloid fracture. The primary fracture is transverse across the metaphysis. There is a separate fracture component of the radial styloid and other fracture components intersecting the lunate facet than the dorsal aspect of the distal radial articular surface. Fracture is dorsally impacted leading to dorsal angulation of the distal radial articular surface of approximately 17 degrees. Bones are demineralized. There is no dislocation. There is diffuse surrounding soft tissue edema. IMPRESSION: 1. Comminuted, dorsally impacted fracture of the distal radial metaphysis with dorsal angulation of the distal radial articular surface. There is associated ulnar styloid fracture. No dislocation. Electronically Signed   By: Lajean Manes M.D.   On: 04/16/2016 17:05   Ct Head Wo Contrast  Result Date: 04/16/2016 CLINICAL DATA:  Pain after trauma EXAM: CT HEAD WITHOUT CONTRAST CT MAXILLOFACIAL WITHOUT CONTRAST CT CERVICAL SPINE WITHOUT CONTRAST TECHNIQUE: Multidetector CT imaging of the head, cervical spine, and maxillofacial structures were performed using the standard protocol without intravenous contrast. Multiplanar CT image reconstructions of the cervical spine and  maxillofacial structures were also generated. COMPARISON:  September 03, 2014 FINDINGS: CT HEAD FINDINGS There are multiple fractures in the right side  of the face which will be described on the maxillofacial portion of the study. There is opacification of the right maxillary sinus. The remainder of the paranasal sinuses are normal. The mastoid air cells and middle ears are normal. There is soft tissue swelling in the right side of the face. The globes are intact. The remainder of the extracranial soft tissues are normal. There is high attenuation in the left side of the quadrigeminal plate cistern on series 3, image 11, consistent with subarachnoid hemorrhage. There is high attenuation in the sulci of the medial right frontal lobe such as on series 3, image 13 and image 15. These findings are new since January 2016. No subdural or epidural hemorrhage. Cerebellum and brainstem are normal. Basal cisterns are patent. Ventricles and sulci are normal for age and contain no blood. No mass effect or midline shift. No cortical ischemia or infarct. CT MAXILLOFACIAL FINDINGS There are complicated fractures through the right facial bones. The roof and medial walls of the right orbit are intact. There are fractures through the lateral wall which are mildly displaced. Displaced fractures are also seen through the inferior wall of the right orbit. Blood and debris obscure the inferior rectus muscle but it appears to remain within the orbit with no evidence of entrapment. Displaced fractures are seen through the lateral wall of the right maxillary sinus extending into the floor. Anterior, posterior, lateral, inferior, and superior wall maxillary sinus fractures identified. The patient has 4 maxillary screws. The fractures through the floor of the right maxillary sinus appear to disrupt the cement around at least 1 of these right sided screws. The medial wall of the right maxillary sinus is intact. The pterygoid plates remain  intact. There is a depressed fracture through the right zygomatic arch anteriorly and a nondisplaced fracture through the posterior right zygomatic arch. The left orbit is intact as is the left zygomatic arch. The mandible is also intact. CT CERVICAL SPINE FINDINGS C5 demonstrates retrolisthesis versus both C4 and C6 with mild focal kyphosis. Retrolisthesis of C5 versus C4 is 2.5 mm which is similar to the retrolisthesis at C5-6. There is minimal anterolisthesis of T1 versus T2. No other malalignment. No fractures. Multilevel degenerative changes most marked at C5-6. Degenerative changes also seen with right greater than left C5-6 neural foraminal narrowing. No critical canal stenosis. IMPRESSION: 1. Subarachnoid hemorrhage in the sulci along the medial right frontal lobe and in the left side of the quadrigeminal plate cistern. 2. Complex fracture involving the lateral and inferior walls of the right orbit without evidence of entrapment of the inferior rectus muscle. There are fractures through the anterior, posterior superior, inferior and lateral walls of the right maxillary sinus. The maxillary fractures affect at least 1 of the 2 maxillary screws. Right zygomatic arch fractures are noted. 3. The malalignment in the cervical spine spine described above is favored to be degenerative with no identified fracture or soft tissue swelling. Findings called to Dr. Tyrone Nine Electronically Signed   By: Dorise Bullion III M.D   On: 04/16/2016 16:43   Ct Cervical Spine Wo Contrast  Result Date: 04/16/2016 CLINICAL DATA:  Pain after trauma EXAM: CT HEAD WITHOUT CONTRAST CT MAXILLOFACIAL WITHOUT CONTRAST CT CERVICAL SPINE WITHOUT CONTRAST TECHNIQUE: Multidetector CT imaging of the head, cervical spine, and maxillofacial structures were performed using the standard protocol without intravenous contrast. Multiplanar CT image reconstructions of the cervical spine and maxillofacial structures were also generated. COMPARISON:   September 03, 2014 FINDINGS: CT HEAD FINDINGS There  are multiple fractures in the right side of the face which will be described on the maxillofacial portion of the study. There is opacification of the right maxillary sinus. The remainder of the paranasal sinuses are normal. The mastoid air cells and middle ears are normal. There is soft tissue swelling in the right side of the face. The globes are intact. The remainder of the extracranial soft tissues are normal. There is high attenuation in the left side of the quadrigeminal plate cistern on series 3, image 11, consistent with subarachnoid hemorrhage. There is high attenuation in the sulci of the medial right frontal lobe such as on series 3, image 13 and image 15. These findings are new since January 2016. No subdural or epidural hemorrhage. Cerebellum and brainstem are normal. Basal cisterns are patent. Ventricles and sulci are normal for age and contain no blood. No mass effect or midline shift. No cortical ischemia or infarct. CT MAXILLOFACIAL FINDINGS There are complicated fractures through the right facial bones. The roof and medial walls of the right orbit are intact. There are fractures through the lateral wall which are mildly displaced. Displaced fractures are also seen through the inferior wall of the right orbit. Blood and debris obscure the inferior rectus muscle but it appears to remain within the orbit with no evidence of entrapment. Displaced fractures are seen through the lateral wall of the right maxillary sinus extending into the floor. Anterior, posterior, lateral, inferior, and superior wall maxillary sinus fractures identified. The patient has 4 maxillary screws. The fractures through the floor of the right maxillary sinus appear to disrupt the cement around at least 1 of these right sided screws. The medial wall of the right maxillary sinus is intact. The pterygoid plates remain intact. There is a depressed fracture through the right zygomatic  arch anteriorly and a nondisplaced fracture through the posterior right zygomatic arch. The left orbit is intact as is the left zygomatic arch. The mandible is also intact. CT CERVICAL SPINE FINDINGS C5 demonstrates retrolisthesis versus both C4 and C6 with mild focal kyphosis. Retrolisthesis of C5 versus C4 is 2.5 mm which is similar to the retrolisthesis at C5-6. There is minimal anterolisthesis of T1 versus T2. No other malalignment. No fractures. Multilevel degenerative changes most marked at C5-6. Degenerative changes also seen with right greater than left C5-6 neural foraminal narrowing. No critical canal stenosis. IMPRESSION: 1. Subarachnoid hemorrhage in the sulci along the medial right frontal lobe and in the left side of the quadrigeminal plate cistern. 2. Complex fracture involving the lateral and inferior walls of the right orbit without evidence of entrapment of the inferior rectus muscle. There are fractures through the anterior, posterior superior, inferior and lateral walls of the right maxillary sinus. The maxillary fractures affect at least 1 of the 2 maxillary screws. Right zygomatic arch fractures are noted. 3. The malalignment in the cervical spine spine described above is favored to be degenerative with no identified fracture or soft tissue swelling. Findings called to Dr. Tyrone Nine Electronically Signed   By: Dorise Bullion III M.D   On: 04/16/2016 16:43   Ct Maxillofacial Wo Cm  Result Date: 04/16/2016 CLINICAL DATA:  Pain after trauma EXAM: CT HEAD WITHOUT CONTRAST CT MAXILLOFACIAL WITHOUT CONTRAST CT CERVICAL SPINE WITHOUT CONTRAST TECHNIQUE: Multidetector CT imaging of the head, cervical spine, and maxillofacial structures were performed using the standard protocol without intravenous contrast. Multiplanar CT image reconstructions of the cervical spine and maxillofacial structures were also generated. COMPARISON:  September 03, 2014  FINDINGS: CT HEAD FINDINGS There are multiple fractures in  the right side of the face which will be described on the maxillofacial portion of the study. There is opacification of the right maxillary sinus. The remainder of the paranasal sinuses are normal. The mastoid air cells and middle ears are normal. There is soft tissue swelling in the right side of the face. The globes are intact. The remainder of the extracranial soft tissues are normal. There is high attenuation in the left side of the quadrigeminal plate cistern on series 3, image 11, consistent with subarachnoid hemorrhage. There is high attenuation in the sulci of the medial right frontal lobe such as on series 3, image 13 and image 15. These findings are new since January 2016. No subdural or epidural hemorrhage. Cerebellum and brainstem are normal. Basal cisterns are patent. Ventricles and sulci are normal for age and contain no blood. No mass effect or midline shift. No cortical ischemia or infarct. CT MAXILLOFACIAL FINDINGS There are complicated fractures through the right facial bones. The roof and medial walls of the right orbit are intact. There are fractures through the lateral wall which are mildly displaced. Displaced fractures are also seen through the inferior wall of the right orbit. Blood and debris obscure the inferior rectus muscle but it appears to remain within the orbit with no evidence of entrapment. Displaced fractures are seen through the lateral wall of the right maxillary sinus extending into the floor. Anterior, posterior, lateral, inferior, and superior wall maxillary sinus fractures identified. The patient has 4 maxillary screws. The fractures through the floor of the right maxillary sinus appear to disrupt the cement around at least 1 of these right sided screws. The medial wall of the right maxillary sinus is intact. The pterygoid plates remain intact. There is a depressed fracture through the right zygomatic arch anteriorly and a nondisplaced fracture through the posterior right  zygomatic arch. The left orbit is intact as is the left zygomatic arch. The mandible is also intact. CT CERVICAL SPINE FINDINGS C5 demonstrates retrolisthesis versus both C4 and C6 with mild focal kyphosis. Retrolisthesis of C5 versus C4 is 2.5 mm which is similar to the retrolisthesis at C5-6. There is minimal anterolisthesis of T1 versus T2. No other malalignment. No fractures. Multilevel degenerative changes most marked at C5-6. Degenerative changes also seen with right greater than left C5-6 neural foraminal narrowing. No critical canal stenosis. IMPRESSION: 1. Subarachnoid hemorrhage in the sulci along the medial right frontal lobe and in the left side of the quadrigeminal plate cistern. 2. Complex fracture involving the lateral and inferior walls of the right orbit without evidence of entrapment of the inferior rectus muscle. There are fractures through the anterior, posterior superior, inferior and lateral walls of the right maxillary sinus. The maxillary fractures affect at least 1 of the 2 maxillary screws. Right zygomatic arch fractures are noted. 3. The malalignment in the cervical spine spine described above is favored to be degenerative with no identified fracture or soft tissue swelling. Findings called to Dr. Tyrone Nine Electronically Signed   By: Dorise Bullion III M.D   On: 04/16/2016 16:43    Review of Systems  Eyes: Positive for double vision (possibly related to the fall) and pain (related to fall).  Musculoskeletal: Positive for back pain (chronic).  Neurological: Positive for loss of consciousness. Negative for seizures.  All other systems reviewed and are negative.   Blood pressure 141/80, pulse 85, resp. rate 14, SpO2 98 %. Physical Exam  Constitutional:  She is oriented to person, place, and time. She appears well-developed and well-nourished.  HENT:  Head: Head is with abrasion, with contusion and with right periorbital erythema.    Eyes: Right eye exhibits abnormal extraocular  motion (apparent dysconjugate gaze and diplopia).  Neck: Normal range of motion. Neck supple.  Cardiovascular: Normal rate and regular rhythm.   No murmur heard. Respiratory: Effort normal and breath sounds normal.  GI: Soft. Bowel sounds are normal.  Musculoskeletal:       Right wrist: She exhibits decreased range of motion, tenderness, bony tenderness, swelling and deformity.  Neurological: She is alert and oriented to person, place, and time. She has normal reflexes.  Skin: Skin is warm and dry.  Psychiatric: She has a normal mood and affect. Her behavior is normal. Judgment and thought content normal.     Assessment/Plan Ground level fall with right wrist fracture, SAH and right orbital fracture with possible lateral rectus entrapment.  Need Maxillofacial surgeon on call to see this patient   Will admit to trauma ICU for observation and repeat CT scan of the head  In the AM per Dr. Melven Sartorius suggestion.  Dimitry Holsworth 04/16/2016, 7:24 PM   Procedures

## 2016-04-16 NOTE — Consult Note (Signed)
Jillian Morris is an 70 y.o. female.   Chief Complaint: right wrist fracture HPI: 70 yo rhd female present with husband states she fell from a standing height today injuring right wrist.  Seen at [facility] where XR revealed distal radius fracture.  Also has SAH and possibly being admitted for observation.  She reports no previous injury to right arm.  She describes and aching pain of 6/10 severity.  It is alleviated with rest and aggravated with motion and palpation.  Case discussed with Dr. Augustin Coupe and his note from 04/16/2016 reviewed. Xrays viewed and interpreted by me: AP, lateral, obliques, scaphoid view show comminuted intraarticular distal radius fracture with dorsal angulation and small dorsal displacement.   Labs reviewed: none  Allergies:  Allergies  Allergen Reactions  . Actonel [Risedronate Sodium]     GI Upset  . Ambien [Zolpidem Tartrate] Other (See Comments)    Went crazy, hallucinations  . Augmentin [Amoxicillin-Pot Clavulanate] Other (See Comments)    GI upset  . Depakote [Divalproex Sodium]     Hallucinations  . Inderal [Propranolol] Other (See Comments)    unknown  . Nsaids Other (See Comments)    Pt. Had gastric bypass surgery   . Penicillins Other (See Comments)    Unknown childhood reaction  . Silicon Nausea And Vomiting    Thyroid storm  . Adhesive [Tape] Itching and Rash    Please use "paper" tape  . Latex Swelling and Rash    Redness lips  . Nickel Itching and Rash    Other reaction(s): Contact Dermatitis (intolerance)    Past Medical History:  Diagnosis Date  . Acne rosacea   . Anemia    s/p gastric bypass, malabsorption  . Anxiety   . Arthritis   . B12 deficiency   . Chronic fatigue fibromyalgia syndrome   . Chronic low back pain   . Depression   . Fibromyalgia   . GERD (gastroesophageal reflux disease)   . Graves disease   . H/O hiatal hernia   . Hypothyroidism   . IBS (irritable bowel syndrome)   . Lymphedema   . Meralgia paraesthetica    . Sjogren's syndrome ([place])   . Status post dilation of esophageal narrowing   . Stroke [facility])     Past Surgical History:  Procedure Laterality Date  . ABDOMINAL HYSTERECTOMY    . APPENDECTOMY    . BALLOON DILATION  03/30/2012   Procedure: BALLOON DILATION;  Surgeon: Inda Castle, MD;  Location: [facility]COPY;  Service: Endoscopy;  Laterality: N/A;  . BOTOX INJECTION N/A 12/26/2014   Procedure: BOTOX INJECTION;  Surgeon: Inda Castle, MD;  Location: [facility];  Service: Endoscopy;  Laterality: N/A;  . ENDOMETRIAL ABLATION     x 4  . ESOPHAGEAL MANOMETRY N/A 05/15/2013   Procedure: ESOPHAGEAL MANOMETRY (EM);  Surgeon: Inda Castle, MD;  Location: [facility];  Service: Endoscopy;  Laterality: N/A;  . ESOPHAGOGASTRODUODENOSCOPY  03/30/2012   Procedure: ESOPHAGOGASTRODUODENOSCOPY (EGD);  Surgeon: Inda Castle, MD;  Location: [facility];  Service: Endoscopy;  Laterality: N/A;  . ESOPHAGOGASTRODUODENOSCOPY  05/04/2012   Procedure: ESOPHAGOGASTRODUODENOSCOPY (EGD);  Surgeon: Inda Castle, MD;  Location: [facility];  Service: Endoscopy;  Laterality: N/A;  . ESOPHAGOGASTRODUODENOSCOPY N/A 12/26/2014   Procedure: ESOPHAGOGASTRODUODENOSCOPY (EGD);  Surgeon: Inda Castle, MD;  Location: [facility];  Service: Endoscopy;  Laterality: N/A;  botox injection  . GASTRIC BYPASS    . LAMINECTOMY     L3-L5  . 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 THYROIDECTOMY      Family History: Family History  Problem Relation Age of Onset  . Prostate cancer Maternal Grandfather   . Lymphoma Maternal Uncle   . Heart disease Father   . Heart disease Sister   . Irritable bowel syndrome Sister   . Dementia Mother   . Stroke Maternal Grandmother   . Appendicitis Paternal Grandmother   . Colon cancer Neg Hx     Social History:   reports that she has never smoked. She has never used smokeless tobacco. She reports that she does not drink alcohol or use drugs.  Medications:  (Not in a  hospital admission)  Results for orders placed or performed during the hospital encounter of 04/16/16 (from the past 48 hour(s))  Comprehensive metabolic panel     Status: Abnormal   Collection Time: 04/16/16  3:36 PM  Result Value Ref Range   Sodium 139 135 - 145 mmol/L   Potassium 4.2 3.5 - 5.1 mmol/L   Chloride 103 101 - 111 mmol/L   CO2 27 22 - 32 mmol/L   Glucose, Bld 112 (H) 65 - 99 mg/dL   BUN 11 6 - 20 mg/dL   Creatinine, Ser 1.28 (H) 0.44 - 1.00 mg/dL   Calcium 9.3 8.9 - 10.3 mg/dL   Total Protein 5.9 (L) 6.5 - 8.1 g/dL   Albumin 3.7 3.5 - 5.0 g/dL   AST 20 15 - 41 U/L   ALT 8 (L) 14 - 54 U/L   Alkaline Phosphatase 89 38 - 126 U/L   Total Bilirubin 0.9 0.3 - 1.2 mg/dL   GFR calc non Af Amer 41 (L) >60 mL/min   GFR calc Af Amer 48 (L) >60 mL/min    Comment: (NOTE) The eGFR has been calculated using the CKD EPI equation. This calculation has not been validated in all clinical situations. eGFR's persistently <60 mL/min signify possible Chronic Kidney Disease.    Anion gap 9 5 - 15  CBC with Differential     Status: Abnormal   Collection Time: 04/16/16  3:36 PM  Result Value Ref Range   WBC 4.4 4.0 - 10.5 K/uL   RBC 4.00 3.87 - 5.11 MIL/uL   Hemoglobin 11.9 (L) 12.0 - 15.0 g/dL   HCT 37.3 36.0 - 46.0 %   MCV 93.3 78.0 - 100.0 fL   MCH 29.8 26.0 - 34.0 pg   MCHC 31.9 30.0 - 36.0 g/dL   RDW 12.9 11.5 - 15.5 %   Platelets 161 150 - 400 K/uL   Neutrophils Relative % 56 %   Neutro Abs 2.5 1.7 - 7.7 K/uL   Lymphocytes Relative 33 %   Lymphs Abs 1.4 0.7 - 4.0 K/uL   Monocytes Relative 5 %   Monocytes Absolute 0.2 0.1 - 1.0 K/uL   Eosinophils Relative 5 %   Eosinophils Absolute 0.2 0.0 - 0.7 K/uL   Basophils Relative 1 %   Basophils Absolute 0.0 0.0 - 0.1 K/uL    Dg Chest 1 View  Result Date: 04/16/2016 CLINICAL DATA:  Pt c/o bilateral upper back pain, generalized right wrist pain, swelling, and bruising, generalized right elbow pain, and bilateral hip pain after  experiencing an unwitnessed fall this afternoon. No known hx of prior injuries or surgeries. No hx of heart or lung problems. Pt is a nonsmoker. EXAM: CHEST 1 VIEW COMPARISON:  12/29/2010 FINDINGS: The cardiac silhouette is normal in size and configuration. No mediastinal or hilar masses or  evidence of adenopathy. Lungs are clear.  No pleural effusion or pneumothorax. Bony thorax is intact. IMPRESSION: No active disease. Electronically Signed   By: Lajean Manes M.D.   On: 04/16/2016 17:02   Dg Thoracic Spine 2 View  Result Date: 04/16/2016 CLINICAL DATA:  Pt c/o bilateral upper back pain, generalized right wrist pain, swelling, and bruising, generalized right elbow pain, and bilateral hip pain after experiencing an unwitnessed fall this afternoon. No known hx of prior injuries or surgeries. No hx of heart or lung problems. Pt is a nonsmoker. EXAM: THORACIC SPINE 2 VIEWS COMPARISON:  Chest CT, 02/18/2011 FINDINGS: No fracture.  No spondylolisthesis.  No bone lesion. Bones are demineralized. There are mild disc degenerative changes centered in the mid thoracic spine. Soft tissues are unremarkable. IMPRESSION: 1. No fracture, spondylolisthesis or acute finding. Electronically Signed   By: Lajean Manes M.D.   On: 04/16/2016 17:03   Dg Pelvis 1-2 Views  Result Date: 04/16/2016 CLINICAL DATA:  Bilateral upper back pain.  Status post fall. EXAM: PELVIS - 1-2 VIEW COMPARISON:  None. FINDINGS: Generalized osteopenia. No acute fracture or subluxation. 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 IMPRESSION: No acute osseous injury of the pelvis. Electronically Signed   By: Kathreen Devoid   On: 04/16/2016 16:59   Dg Elbow Complete Right  Result Date: 04/16/2016 CLINICAL DATA:  Pt c/o bilateral upper back pain, generalized right wrist pain, swelling, and bruising, generalized right elbow pain, and bilateral hip pain after experiencing an unwitnessed fall this  afternoon. No known hx of prior injuries or surgeries. No hx of heart or lung problems. Pt is a nonsmoker. EXAM: RIGHT ELBOW - COMPLETE 3+ VIEW COMPARISON:  None. FINDINGS: No fracture or dislocation. Elbow joint normally spaced and aligned.  No joint effusion. Soft tissues are unremarkable. IMPRESSION: No fracture or dislocation. Electronically Signed   By: Lajean Manes M.D.   On: 04/16/2016 16:58   Dg Wrist Complete Right  Result Date: 04/16/2016 CLINICAL DATA:  Pt c/o bilateral upper back pain, generalized right wrist pain, swelling, and bruising, generalized right elbow pain, and bilateral hip pain after experiencing an unwitnessed fall this afternoon. No known hx of prior injuries or surgeries. No hx of heart or lung problems. Pt is a nonsmoker. EXAM: RIGHT WRIST - COMPLETE 3+ VIEW COMPARISON:  None. FINDINGS: There is a comminuted fracture of the distal radius with an associated nondisplaced ulnar styloid fracture. The primary fracture is transverse across the metaphysis. There is a separate fracture component of the radial styloid and other fracture components intersecting the lunate facet than the dorsal aspect of the distal radial articular surface. Fracture is dorsally impacted leading to dorsal angulation of the distal radial articular surface of approximately 17 degrees. Bones are demineralized. There is no dislocation. There is diffuse surrounding soft tissue edema. IMPRESSION: 1. Comminuted, dorsally impacted fracture of the distal radial metaphysis with dorsal angulation of the distal radial articular surface. There is associated ulnar styloid fracture. No dislocation. Electronically Signed   By: Lajean Manes M.D.   On: 04/16/2016 17:05   Ct Head Wo Contrast  Result Date: 04/16/2016 CLINICAL DATA:  Pain after trauma EXAM: CT HEAD WITHOUT CONTRAST CT MAXILLOFACIAL WITHOUT CONTRAST CT CERVICAL SPINE WITHOUT CONTRAST TECHNIQUE: Multidetector CT imaging of the head, cervical spine, and  maxillofacial structures were performed using the standard protocol without intravenous contrast. Multiplanar CT image reconstructions of the cervical spine and maxillofacial structures were also generated. COMPARISON:  September 03, 2014 FINDINGS: CT HEAD FINDINGS There are multiple fractures in the right side of the face which will be described on the maxillofacial portion of the study. There is opacification of the right maxillary sinus. The remainder of the paranasal sinuses are normal. The mastoid air cells and middle ears are normal. There is soft tissue swelling in the right side of the face. The globes are intact. The remainder of the extracranial soft tissues are normal. There is high attenuation in the left side of the quadrigeminal plate cistern on series 3, image 11, consistent with subarachnoid hemorrhage. There is high attenuation in the sulci of the medial right frontal lobe such as on series 3, image 13 and image 15. These findings are new since January 2016. No subdural or epidural hemorrhage. Cerebellum and brainstem are normal. Basal cisterns are patent. Ventricles and sulci are normal for age and contain no blood. No mass effect or midline shift. No cortical ischemia or infarct. CT MAXILLOFACIAL FINDINGS There are complicated fractures through the right facial bones. The roof and medial walls of the right orbit are intact. There are fractures through the lateral wall which are mildly displaced. Displaced fractures are also seen through the inferior wall of the right orbit. Blood and debris obscure the inferior rectus muscle but it appears to remain within the orbit with no evidence of entrapment. Displaced fractures are seen through the lateral wall of the right maxillary sinus extending into the floor. Anterior, posterior, lateral, inferior, and superior wall maxillary sinus fractures identified. The patient has 4 maxillary screws. The fractures through the floor of the right maxillary sinus  appear to disrupt the cement around at least 1 of these right sided screws. The medial wall of the right maxillary sinus is intact. The pterygoid plates remain intact. There is a depressed fracture through the right zygomatic arch anteriorly and a nondisplaced fracture through the posterior right zygomatic arch. The left orbit is intact as is the left zygomatic arch. The mandible is also intact. CT CERVICAL SPINE FINDINGS C5 demonstrates retrolisthesis versus both C4 and C6 with mild focal kyphosis. Retrolisthesis of C5 versus C4 is 2.5 mm which is similar to the retrolisthesis at C5-6. There is minimal anterolisthesis of T1 versus T2. No other malalignment. No fractures. Multilevel degenerative changes most marked at C5-6. Degenerative changes also seen with right greater than left C5-6 neural foraminal narrowing. No critical canal stenosis. IMPRESSION: 1. Subarachnoid hemorrhage in the sulci along the medial right frontal lobe and in the left side of the quadrigeminal plate cistern. 2. Complex fracture involving the lateral and inferior walls of the right orbit without evidence of entrapment of the inferior rectus muscle. There are fractures through the anterior, posterior superior, inferior and lateral walls of the right maxillary sinus. The maxillary fractures affect at least 1 of the 2 maxillary screws. Right zygomatic arch fractures are noted. 3. The malalignment in the cervical spine spine described above is favored to be degenerative with no identified fracture or soft tissue swelling. Findings called to Dr. Tyrone Nine Electronically Signed   By: Dorise Bullion III M.D   On: 04/16/2016 16:43   Ct Cervical Spine Wo Contrast  Result Date: 04/16/2016 CLINICAL DATA:  Pain after trauma EXAM: CT HEAD WITHOUT CONTRAST CT MAXILLOFACIAL WITHOUT CONTRAST CT CERVICAL SPINE WITHOUT CONTRAST TECHNIQUE: Multidetector CT imaging of the head, cervical spine, and maxillofacial structures were performed using the standard  protocol without intravenous contrast. Multiplanar CT image reconstructions of the cervical spine and  maxillofacial structures were also generated. COMPARISON:  September 03, 2014 FINDINGS: CT HEAD FINDINGS There are multiple fractures in the right side of the face which will be described on the maxillofacial portion of the study. There is opacification of the right maxillary sinus. The remainder of the paranasal sinuses are normal. The mastoid air cells and middle ears are normal. There is soft tissue swelling in the right side of the face. The globes are intact. The remainder of the extracranial soft tissues are normal. There is high attenuation in the left side of the quadrigeminal plate cistern on series 3, image 11, consistent with subarachnoid hemorrhage. There is high attenuation in the sulci of the medial right frontal lobe such as on series 3, image 13 and image 15. These findings are new since January 2016. No subdural or epidural hemorrhage. Cerebellum and brainstem are normal. Basal cisterns are patent. Ventricles and sulci are normal for age and contain no blood. No mass effect or midline shift. No cortical ischemia or infarct. CT MAXILLOFACIAL FINDINGS There are complicated fractures through the right facial bones. The roof and medial walls of the right orbit are intact. There are fractures through the lateral wall which are mildly displaced. Displaced fractures are also seen through the inferior wall of the right orbit. Blood and debris obscure the inferior rectus muscle but it appears to remain within the orbit with no evidence of entrapment. Displaced fractures are seen through the lateral wall of the right maxillary sinus extending into the floor. Anterior, posterior, lateral, inferior, and superior wall maxillary sinus fractures identified. The patient has 4 maxillary screws. The fractures through the floor of the right maxillary sinus appear to disrupt the cement around at least 1 of these right  sided screws. The medial wall of the right maxillary sinus is intact. The pterygoid plates remain intact. There is a depressed fracture through the right zygomatic arch anteriorly and a nondisplaced fracture through the posterior right zygomatic arch. The left orbit is intact as is the left zygomatic arch. The mandible is also intact. CT CERVICAL SPINE FINDINGS C5 demonstrates retrolisthesis versus both C4 and C6 with mild focal kyphosis. Retrolisthesis of C5 versus C4 is 2.5 mm which is similar to the retrolisthesis at C5-6. There is minimal anterolisthesis of T1 versus T2. No other malalignment. No fractures. Multilevel degenerative changes most marked at C5-6. Degenerative changes also seen with right greater than left C5-6 neural foraminal narrowing. No critical canal stenosis. IMPRESSION: 1. Subarachnoid hemorrhage in the sulci along the medial right frontal lobe and in the left side of the quadrigeminal plate cistern. 2. Complex fracture involving the lateral and inferior walls of the right orbit without evidence of entrapment of the inferior rectus muscle. There are fractures through the anterior, posterior superior, inferior and lateral walls of the right maxillary sinus. The maxillary fractures affect at least 1 of the 2 maxillary screws. Right zygomatic arch fractures are noted. 3. The malalignment in the cervical spine spine described above is favored to be degenerative with no identified fracture or soft tissue swelling. Findings called to Dr. Tyrone Nine Electronically Signed   By: Dorise Bullion III M.D   On: 04/16/2016 16:43   Ct Maxillofacial Wo Cm  Result Date: 04/16/2016 CLINICAL DATA:  Pain after trauma EXAM: CT HEAD WITHOUT CONTRAST CT MAXILLOFACIAL WITHOUT CONTRAST CT CERVICAL SPINE WITHOUT CONTRAST TECHNIQUE: Multidetector CT imaging of the head, cervical spine, and maxillofacial structures were performed using the standard protocol without intravenous contrast. Multiplanar CT image  reconstructions of the cervical spine and maxillofacial structures were also generated. COMPARISON:  September 03, 2014 FINDINGS: CT HEAD FINDINGS There are multiple fractures in the right side of the face which will be described on the maxillofacial portion of the study. There is opacification of the right maxillary sinus. The remainder of the paranasal sinuses are normal. The mastoid air cells and middle ears are normal. There is soft tissue swelling in the right side of the face. The globes are intact. The remainder of the extracranial soft tissues are normal. There is high attenuation in the left side of the quadrigeminal plate cistern on series 3, image 11, consistent with subarachnoid hemorrhage. There is high attenuation in the sulci of the medial right frontal lobe such as on series 3, image 13 and image 15. These findings are new since January 2016. No subdural or epidural hemorrhage. Cerebellum and brainstem are normal. Basal cisterns are patent. Ventricles and sulci are normal for age and contain no blood. No mass effect or midline shift. No cortical ischemia or infarct. CT MAXILLOFACIAL FINDINGS There are complicated fractures through the right facial bones. The roof and medial walls of the right orbit are intact. There are fractures through the lateral wall which are mildly displaced. Displaced fractures are also seen through the inferior wall of the right orbit. Blood and debris obscure the inferior rectus muscle but it appears to remain within the orbit with no evidence of entrapment. Displaced fractures are seen through the lateral wall of the right maxillary sinus extending into the floor. Anterior, posterior, lateral, inferior, and superior wall maxillary sinus fractures identified. The patient has 4 maxillary screws. The fractures through the floor of the right maxillary sinus appear to disrupt the cement around at least 1 of these right sided screws. The medial wall of the right maxillary sinus is  intact. The pterygoid plates remain intact. There is a depressed fracture through the right zygomatic arch anteriorly and a nondisplaced fracture through the posterior right zygomatic arch. The left orbit is intact as is the left zygomatic arch. The mandible is also intact. CT CERVICAL SPINE FINDINGS C5 demonstrates retrolisthesis versus both C4 and C6 with mild focal kyphosis. Retrolisthesis of C5 versus C4 is 2.5 mm which is similar to the retrolisthesis at C5-6. There is minimal anterolisthesis of T1 versus T2. No other malalignment. No fractures. Multilevel degenerative changes most marked at C5-6. Degenerative changes also seen with right greater than left C5-6 neural foraminal narrowing. No critical canal stenosis. IMPRESSION: 1. Subarachnoid hemorrhage in the sulci along the medial right frontal lobe and in the left side of the quadrigeminal plate cistern. 2. Complex fracture involving the lateral and inferior walls of the right orbit without evidence of entrapment of the inferior rectus muscle. There are fractures through the anterior, posterior superior, inferior and lateral walls of the right maxillary sinus. The maxillary fractures affect at least 1 of the 2 maxillary screws. Right zygomatic arch fractures are noted. 3. The malalignment in the cervical spine spine described above is favored to be degenerative with no identified fracture or soft tissue swelling. Findings called to Dr. Tyrone Nine Electronically Signed   By: Dorise Bullion III M.D   On: 04/16/2016 16:43     A comprehensive review of systems was negative. Review of Systems: No fevers, chills, night sweats, chest pain, shortness of breath, nausea, vomiting, diarrhea, constipation, easy bleeding or bruising, headaches, dizziness, vision changes, fainting.   Blood pressure 141/80, pulse 85, resp. rate 14, SpO2 98 %.  General appearance: alert, cooperative and appears stated age Head: Normocephalic, without obvious abnormality, brusing and  bleeding Neck: supple, symmetrical, trachea midline Extremities: Intact sensation and capillary refill all digits.  +epl/fpl/io.  No wounds. Ecchymosis volar and dorsal right wrist.  Dorsal angulation at wrist.  No wounds.  Swollen. Pulses: 2+ and symmetric Skin: Skin color, texture, turgor normal. No rashes or lesions Neurologic: Grossly normal Incision/Wound: None to right wrist.  Assessment/Plan Right distal radius fracture.  Splinted by ortho tech.  Briefly discussed non surgical and surgical options.  Will see in office next week for further discussion and planning of care.  She and husband agree with the plan.  Larina Lieurance R 04/16/2016, 7:19 PM

## 2016-04-16 NOTE — Progress Notes (Signed)
Orthopedic Tech Progress Note Patient Details:  Jillian Morris 01/26/1946 [id number]  Ortho Devices Type of Ortho Device: Arm sling, Ace wrap, Sugartong splint Ortho Device/Splint Location: RUE Ortho Device/Splint Interventions: Ordered, Application   Jennye MoccasinHughes, [name] 04/16/2016, 7:29 PM

## 2016-04-17 ENCOUNTER — Observation Stay (HOSPITAL_COMMUNITY): Payer: Medicare Other

## 2016-04-17 DIAGNOSIS — S62101S Fracture of unspecified carpal bone, right wrist, sequela: Secondary | ICD-10-CM | POA: Diagnosis not present

## 2016-04-17 DIAGNOSIS — G8929 Other chronic pain: Secondary | ICD-10-CM | POA: Diagnosis not present

## 2016-04-17 DIAGNOSIS — S066X2S Traumatic subarachnoid hemorrhage with loss of consciousness of 31 minutes to 59 minutes, sequela: Secondary | ICD-10-CM | POA: Diagnosis not present

## 2016-04-17 DIAGNOSIS — Y9301 Activity, walking, marching and hiking: Secondary | ICD-10-CM | POA: Diagnosis present

## 2016-04-17 DIAGNOSIS — S066X9D Traumatic subarachnoid hemorrhage with loss of consciousness of unspecified duration, subsequent encounter: Secondary | ICD-10-CM | POA: Diagnosis not present

## 2016-04-17 DIAGNOSIS — R402363 Coma scale, best motor response, obeys commands, at hospital admission: Secondary | ICD-10-CM | POA: Diagnosis present

## 2016-04-17 DIAGNOSIS — E538 Deficiency of other specified B group vitamins: Secondary | ICD-10-CM | POA: Diagnosis present

## 2016-04-17 DIAGNOSIS — S0240EA Zygomatic fracture, right side, initial encounter for closed fracture: Secondary | ICD-10-CM | POA: Diagnosis present

## 2016-04-17 DIAGNOSIS — E0500 Thyrotoxicosis with diffuse goiter without thyrotoxic crisis or storm: Secondary | ICD-10-CM | POA: Diagnosis present

## 2016-04-17 DIAGNOSIS — S0240ED Zygomatic fracture, right side, subsequent encounter for fracture with routine healing: Secondary | ICD-10-CM | POA: Diagnosis not present

## 2016-04-17 DIAGNOSIS — H532 Diplopia: Secondary | ICD-10-CM | POA: Diagnosis present

## 2016-04-17 DIAGNOSIS — Z9884 Bariatric surgery status: Secondary | ICD-10-CM | POA: Diagnosis not present

## 2016-04-17 DIAGNOSIS — S0292XS Unspecified fracture of facial bones, sequela: Secondary | ICD-10-CM | POA: Diagnosis not present

## 2016-04-17 DIAGNOSIS — K220 Achalasia of cardia: Secondary | ICD-10-CM | POA: Diagnosis present

## 2016-04-17 DIAGNOSIS — W1809XA Striking against other object with subsequent fall, initial encounter: Secondary | ICD-10-CM | POA: Diagnosis present

## 2016-04-17 DIAGNOSIS — S066X9A Traumatic subarachnoid hemorrhage with loss of consciousness of unspecified duration, initial encounter: Secondary | ICD-10-CM | POA: Diagnosis present

## 2016-04-17 DIAGNOSIS — S52501S Unspecified fracture of the lower end of right radius, sequela: Secondary | ICD-10-CM | POA: Diagnosis not present

## 2016-04-17 DIAGNOSIS — R5382 Chronic fatigue, unspecified: Secondary | ICD-10-CM | POA: Diagnosis not present

## 2016-04-17 DIAGNOSIS — S52509S Unspecified fracture of the lower end of unspecified radius, sequela: Secondary | ICD-10-CM | POA: Diagnosis not present

## 2016-04-17 DIAGNOSIS — F329 Major depressive disorder, single episode, unspecified: Secondary | ICD-10-CM | POA: Diagnosis present

## 2016-04-17 DIAGNOSIS — S066X3S Traumatic subarachnoid hemorrhage with loss of consciousness of 1 hour to 5 hours 59 minutes, sequela: Secondary | ICD-10-CM | POA: Diagnosis not present

## 2016-04-17 DIAGNOSIS — M3500 Sicca syndrome, unspecified: Secondary | ICD-10-CM | POA: Diagnosis present

## 2016-04-17 DIAGNOSIS — R402253 Coma scale, best verbal response, oriented, at hospital admission: Secondary | ICD-10-CM | POA: Diagnosis present

## 2016-04-17 DIAGNOSIS — Z79891 Long term (current) use of opiate analgesic: Secondary | ICD-10-CM | POA: Diagnosis not present

## 2016-04-17 DIAGNOSIS — S0240CD Maxillary fracture, right side, subsequent encounter for fracture with routine healing: Secondary | ICD-10-CM | POA: Diagnosis not present

## 2016-04-17 DIAGNOSIS — M797 Fibromyalgia: Secondary | ICD-10-CM | POA: Diagnosis present

## 2016-04-17 DIAGNOSIS — F419 Anxiety disorder, unspecified: Secondary | ICD-10-CM | POA: Diagnosis present

## 2016-04-17 DIAGNOSIS — R402143 Coma scale, eyes open, spontaneous, at hospital admission: Secondary | ICD-10-CM | POA: Diagnosis present

## 2016-04-17 DIAGNOSIS — R269 Unspecified abnormalities of gait and mobility: Secondary | ICD-10-CM | POA: Diagnosis not present

## 2016-04-17 DIAGNOSIS — D649 Anemia, unspecified: Secondary | ICD-10-CM | POA: Diagnosis present

## 2016-04-17 DIAGNOSIS — S0281XA Fracture of other specified skull and facial bones, right side, initial encounter for closed fracture: Secondary | ICD-10-CM | POA: Diagnosis present

## 2016-04-17 DIAGNOSIS — I609 Nontraumatic subarachnoid hemorrhage, unspecified: Secondary | ICD-10-CM | POA: Diagnosis not present

## 2016-04-17 DIAGNOSIS — R51 Headache: Secondary | ICD-10-CM | POA: Diagnosis present

## 2016-04-17 DIAGNOSIS — M545 Low back pain: Secondary | ICD-10-CM | POA: Diagnosis not present

## 2016-04-17 DIAGNOSIS — Z823 Family history of stroke: Secondary | ICD-10-CM | POA: Diagnosis not present

## 2016-04-17 DIAGNOSIS — S0281XD Fracture of other specified skull and facial bones, right side, subsequent encounter for fracture with routine healing: Secondary | ICD-10-CM | POA: Diagnosis not present

## 2016-04-17 DIAGNOSIS — E890 Postprocedural hypothyroidism: Secondary | ICD-10-CM | POA: Diagnosis present

## 2016-04-17 DIAGNOSIS — K589 Irritable bowel syndrome without diarrhea: Secondary | ICD-10-CM | POA: Diagnosis present

## 2016-04-17 DIAGNOSIS — W19XXXS Unspecified fall, sequela: Secondary | ICD-10-CM | POA: Diagnosis not present

## 2016-04-17 DIAGNOSIS — S52501D Unspecified fracture of the lower end of right radius, subsequent encounter for closed fracture with routine healing: Secondary | ICD-10-CM | POA: Diagnosis not present

## 2016-04-17 DIAGNOSIS — S069X3S Unspecified intracranial injury with loss of consciousness of 1 hour to 5 hours 59 minutes, sequela: Secondary | ICD-10-CM | POA: Diagnosis not present

## 2016-04-17 DIAGNOSIS — M1990 Unspecified osteoarthritis, unspecified site: Secondary | ICD-10-CM | POA: Diagnosis present

## 2016-04-17 DIAGNOSIS — S52571A Other intraarticular fracture of lower end of right radius, initial encounter for closed fracture: Secondary | ICD-10-CM | POA: Diagnosis present

## 2016-04-17 DIAGNOSIS — Z8673 Personal history of transient ischemic attack (TIA), and cerebral infarction without residual deficits: Secondary | ICD-10-CM | POA: Diagnosis not present

## 2016-04-17 DIAGNOSIS — K219 Gastro-esophageal reflux disease without esophagitis: Secondary | ICD-10-CM | POA: Diagnosis present

## 2016-04-17 LAB — BASIC METABOLIC PANEL
Anion gap: 10 (ref 5–15)
BUN: 11 mg/dL (ref 6–20)
CO2: 27 mmol/L (ref 22–32)
Calcium: 8.9 mg/dL (ref 8.9–10.3)
Chloride: 101 mmol/L (ref 101–111)
Creatinine, Ser: 1.02 mg/dL — ABNORMAL HIGH (ref 0.44–1.00)
GFR calc Af Amer: >60 mL/min (ref >60)
GFR calc non Af Amer: 54 mL/min — ABNORMAL LOW (ref >60)
Glucose, Bld: 103 mg/dL — ABNORMAL HIGH (ref 65–99)
Potassium: 4.3 mmol/L (ref 3.5–5.1)
Sodium: 138 mmol/L (ref 135–145)

## 2016-04-17 LAB — CBC
HCT: 33.8 % — ABNORMAL LOW (ref 36.0–46.0)
Hemoglobin: 10.9 g/dL — ABNORMAL LOW (ref 12.0–15.0)
MCH: 29.8 pg (ref 26.0–34.0)
MCHC: 32.2 g/dL (ref 30.0–36.0)
MCV: 92.3 fL (ref 78.0–100.0)
Platelets: 172 10*3/uL (ref 150–400)
RBC: 3.66 MIL/uL — ABNORMAL LOW (ref 3.87–5.11)
RDW: 12.7 % (ref 11.5–15.5)
WBC: 5 10*3/uL (ref 4.0–10.5)

## 2016-04-17 LAB — MRSA PCR SCREENING: MRSA by PCR: NEGATIVE

## 2016-04-17 NOTE — Consult Note (Signed)
Physical Medicine and Rehabilitation Consult Reason for Consult: Right distal radius fracture and orbital fracture/SAH Referring Physician: Trauma   HPI: Jillian Morris is a 70 y.o. right handed female with history of fibromyalgia, chronic fatigue syndrome, chronic low back pain. Per chart review patient lives with spouse independently with a cane prior to admission. One level home. Presented 04/16/2016 after ground-level fall in the hallway of her ophthalmologist office. CT of the head showed subarachnoid hemorrhage in the salt diet along the medial right frontal lobe and in the left side of the quadrigeminal plate cistern. Complex fracture involving the lateral inferior walls of the right orbit. Fractures to the anterior posterior superior inferior and lateral walls of the right maxillary sinus. Right zygomatic arch fracture. CT cervical spine negative. Noted also findings of right comminuted dorsally impacted fracture of the distal radius with dorsal angulation of the distal radial articular surface. Dr. Venetia MaxonStern for follow[facility]. Follow-up cranial CT scan stable. Await recommendations of right distal radius fracture. Currently with right upper extremity sling. Hospital course pain management. Physical occupational therapy evaluations completed with recommendations of physical medicine rehabilitation consult   Review of Systems  Constitutional: Positive for malaise/fatigue. Negative for chills and fever.  Eyes: Negative for blurred vision and double vision.  Respiratory: Negative for cough and shortness of breath.   Cardiovascular: Positive for leg swelling. Negative for chest pain and palpitations.  Gastrointestinal: Positive for constipation. Negative for nausea and vomiting.       GERD  Genitourinary: Negative for dysuria and hematuria.  Musculoskeletal: Positive for back pain and falls.  Skin: Negative for rash.  Neurological: Positive for  headaches. Negative for seizures.  Psychiatric/Behavioral: Positive for depression.       Anxiety  All other systems reviewed and are negative.  Past Medical History:  Diagnosis Date  . Acne rosacea   . Anemia    s/p gastric bypass, malabsorption  . Anxiety   . Arthritis   . B12 deficiency   . Chronic fatigue fibromyalgia syndrome   . Chronic low back pain   . Depression   . Fibromyalgia   . GERD (gastroesophageal reflux disease)   . Graves disease   . H/O hiatal hernia   . Hypothyroidism   . IBS (irritable bowel syndrome)   . Lymphedema   . Meralgia paraesthetica   . Sjogren's syndrome (HCC)   . Status post dilation of esophageal narrowing   . Stroke [facility](HCC)    Past Surgical History:  Procedure Laterality Date  . ABDOMINAL HYSTERECTOMY    . APPENDECTOMY    . BALLOON DILATION  03/30/2012   Procedure: BALLOON DILATION;  Surgeon: Louis Meckelobert D Kaplan, MD;  Location: [facility];  Service: Endoscopy;  Laterality: N/A;  . BOTOX INJECTION N/A 12/26/2014   Procedure: BOTOX INJECTION;  Surgeon: Louis Meckelobert D Kaplan, MD;  Location: [facility];  Service: Endoscopy;  Laterality: N/A;  . ENDOMETRIAL ABLATION     x 4  . ESOPHAGEAL MANOMETRY N/A 05/15/2013   Procedure: ESOPHAGEAL MANOMETRY (EM);  Surgeon: Louis Meckelobert D Kaplan, MD;  Location: [facility];  Service: Endoscopy;  Laterality: N/A;  . ESOPHAGOGASTRODUODENOSCOPY  03/30/2012   Procedure: ESOPHAGOGASTRODUODENOSCOPY (EGD);  Surgeon: Louis Meckelobert D Kaplan, MD;  [facility];  Service: Endoscopy;  Laterality: N/A;  . ESOPHAGOGASTRODUODENOSCOPY  05/04/2012   Procedure: ESOPHAGOGASTRODUODENOSCOPY (EGD);  Surgeon: Louis Meckelobert D Kaplan, MD;  [facility];  Service: Endoscopy;  Laterality: N/A;  . ESOPHAGOGASTRODUODENOSCOPY N/A 12/26/2014  Procedure: ESOPHAGOGASTRODUODENOSCOPY (EGD);  Surgeon: Louis Meckel, MD;  [facility];  Service: Endoscopy;  Laterality: N/A;  botox injection  . GASTRIC BYPASS    . LAMINECTOMY     L3-L5  .  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 THYROIDECTOMY     Family History  Problem Relation Age of Onset  . Prostate cancer Maternal Grandfather   . Lymphoma Maternal Uncle   . Heart disease Father   . Heart disease Sister   . Irritable bowel syndrome Sister   . Dementia Mother   . Stroke Maternal Grandmother   . Appendicitis Paternal Grandmother   . Colon cancer Neg Hx    Social History:  reports that she has never smoked. She has never used smokeless tobacco. She reports that she does not drink alcohol or use drugs. Allergies:  Allergies  Allergen Reactions  . Actonel [Risedronate Sodium]     GI Upset  . Ambien [Zolpidem Tartrate] Other (See Comments)    Went crazy, hallucinations  . Augmentin [Amoxicillin-Pot Clavulanate] Other (See Comments)    GI upset  . Depakote [Divalproex Sodium]     Hallucinations  . Inderal [Propranolol] Other (See Comments)    unknown  . Nsaids Other (See Comments)    Pt. Had gastric bypass surgery   . Penicillins Other (See Comments)    Unknown childhood reaction  . Silicon Nausea And Vomiting    Thyroid storm  . Adhesive [Tape] Itching and Rash    Please use "paper" tape  . Latex Swelling and Rash    Redness lips  . Nickel Itching and Rash    Other reaction(s): Contact Dermatitis (intolerance)   Medications Prior to Admission  Medication Sig Dispense Refill  . ALPRAZolam (XANAX) 0.25 MG tablet Take 0.25 mg by mouth 2 (two) times daily as needed (for panic attacks).     . cyanocobalamin (,VITAMIN B-12,) 1000 MCG/ML injection Inject 1,000 mcg into the muscle every 30 (thirty) days.     . DULoxetine (CYMBALTA) 60 MG capsule Take 60 mg by mouth 2 (two) times daily.    Marland Kitchen lamoTRIgine (LAMICTAL) 100 MG tablet Take 300 mg by mouth at bedtime.    . lidocaine (LIDODERM) 5 % Place 1 patch onto the skin daily. Remove & Discard patch within 12 hours or as directed by MD    . LORazepam (ATIVAN) 1 MG tablet Take 1 mg by mouth at bedtime.     .  metroNIDAZOLE (METROCREAM) 0.75 % cream Apply 1 application topically 2 (two) times daily.     Marland Kitchen morphine (MS CONTIN) 30 MG 12 hr tablet Take 30 mg by mouth every 12 (twelve) hours.     . mupirocin cream (BACTROBAN) 2 % 1 application.    Marland Kitchen oxymorphone (OPANA) 10 MG tablet Take 10 mg by mouth every 6 (six) hours as needed for pain.    . ranitidine (ZANTAC) 300 MG tablet Take 300 mg by mouth at bedtime.     Marland Kitchen rOPINIRole (REQUIP) 1 MG tablet 1 TABLET (1 mg) BEFORE BEDTIME as needed for restless leg syndrome  6  . SYNTHROID 125 MCG tablet TAKE 1 TABLET (125 mcg) BY MOUTH EVERY MORNING ON EMPTY STOMACH ONCE DAILY  3  . dexlansoprazole (DEXILANT) 60 MG capsule TAKE 1 CAPSULE (60 MG TOTAL) BY MOUTH DAILY. (Patient not taking: Reported on 04/16/2016) 90 capsule 3    Home: Home Living Family/patient expects to be discharged to:: Private residence Living Arrangements: Spouse/significant other Available  Help at Discharge: Family Type of Home: House Home Access: Level entry Home Layout: One level Bathroom Shower/Tub:  (sponge bathes) Bathroom Toilet: Handicapped height (one standard) Home Equipment: Cane - single point  Functional History: Prior Function Level of Independence: Independent with assistive device(s) Comments: SPC for mobility  Functional Status:  Mobility: Bed Mobility Overal bed mobility: Needs Assistance Bed Mobility: Supine to Sit Supine to sit: Min guard General bed mobility comments: min guard for safety Transfers Overall transfer level: Needs assistance Equipment used: 1 person hand held assist Transfers: Sit to/from Stand Sit to Stand: Min assist General transfer comment: min assist for balance and stability when elevating to upright Ambulation/Gait Ambulation/Gait assistance: Mod assist Ambulation Distance (Feet): 90 Feet Assistive device: 1 person hand held assist Gait Pattern/deviations: Step-through pattern, Decreased stride length, Shuffle, Narrow base of  support General Gait Details: patient very unsteady with ambulation, also required assist for safety due to visual deficits and balance deficits related to LE weakness and limited RUE altering overall gait Gait velocity: decreased Gait velocity interpretation: Below normal speed for age/gender    ADL: ADL Overall ADL's : Needs assistance/impaired Eating/Feeding: Set up, Sitting Grooming: Minimal assistance, Sitting Upper Body Bathing: Minimal assitance, Sitting Lower Body Bathing: Minimal assistance, Sit to/from stand Upper Body Dressing : Minimal assistance, Sitting Upper Body Dressing Details (indicate cue type and reason): for donning gown Lower Body Dressing: Minimal assistance, Sit to/from stand Lower Body Dressing Details (indicate cue type and reason): Min assist to don socks sitting EOB. Pt able to cross foot over opposite knee to assist with donning socks. Toilet Transfer: Moderate assistance, Ambulation, BSC, +2 for safety/equipment Toilet Transfer Details (indicate cue type and reason): Simulated by sit to stand from EOB with functional mobility in room. Toileting- Clothing Manipulation and Hygiene: Minimal assistance, Sit to/from stand Functional mobility during ADLs: Moderate assistance, +2 for safety/equipment (1 person hand held assist) General ADL Comments: Pt reports intermittent confusion over the past few weeks; can identify two instances when she was out driving and got lost/"turned around".   Cognition: Cognition Overall Cognitive Status: Impaired/Different from baseline Orientation Level: Oriented X4 Cognition Arousal/Alertness: Awake/alert Behavior During Therapy: Flat affect, Impulsive Overall Cognitive Status: Impaired/Different from baseline Area of Impairment: Attention, Memory, Following commands, Safety/judgement, Awareness, Problem solving Current Attention Level: Selective Memory: Decreased short-term memory Following Commands: Follows one step  commands with increased time Safety/Judgement: Decreased awareness of safety, Decreased awareness of deficits Awareness: Emergent Problem Solving: Slow processing, Decreased initiation, Difficulty sequencing, Requires verbal cues, Requires tactile cues General Comments: patient tangential throughout session, also acknowledges some various degress of baseline confusion over the past several weeks. Patient indicated that she started using a cane because she 'got lost driving to her hair appointment' and does not comprehend while this correlation does not appear fitting  Blood pressure (!) 114/56, pulse 87, temperature 98.6 F (37 C), temperature source Oral, resp. rate (!) 21, height 5\' 2"  (1.575 m), weight 85.7 kg (188 lb 15 oz), SpO2 93 %. Physical Exam  HENT:  Multiple bruising to the face  Neck: Normal range of motion. Neck supple. No thyromegaly present.  Cardiovascular: Normal rate and regular rhythm.   Respiratory: Effort normal and breath sounds normal. No respiratory distress.  GI: Soft. Bowel sounds are normal. She exhibits no distension.  Musculoskeletal:  Right upper extremity with sling  Neurological: She is alert.  Oriented to person place and time.  Skin: Skin is warm and dry.    Results for orders placed  or performed during the hospital encounter of 04/16/16 (from the past 24 hour(s))  Comprehensive metabolic panel     Status: Abnormal   Collection Time: 04/16/16  3:36 PM  Result Value Ref Range   Sodium 139 135 - 145 mmol/L   Potassium 4.2 3.5 - 5.1 mmol/L   Chloride 103 101 - 111 mmol/L   CO2 27 22 - 32 mmol/L   Glucose, Bld 112 (H) 65 - 99 mg/dL   BUN 11 6 - 20 mg/dL   Creatinine, Ser 1.47 (H) 0.44 - 1.00 mg/dL   Calcium 9.3 8.9 - 82.9 mg/dL   Total Protein 5.9 (L) 6.5 - 8.1 g/dL   Albumin 3.7 3.5 - 5.0 g/dL   AST 20 15 - 41 U/L   ALT 8 (L) 14 - 54 U/L   Alkaline Phosphatase 89 38 - 126 U/L   Total Bilirubin 0.9 0.3 - 1.2 mg/dL   GFR calc non Af Amer 41 (L)  >60 mL/min   GFR calc Af Amer 48 (L) >60 mL/min   Anion gap 9 5 - 15  CBC with Differential     Status: Abnormal   Collection Time: 04/16/16  3:36 PM  Result Value Ref Range   WBC 4.4 4.0 - 10.5 K/uL   RBC 4.00 3.87 - 5.11 MIL/uL   Hemoglobin 11.9 (L) 12.0 - 15.0 g/dL   HCT 56.2 13.0 - 86.5 %   MCV 93.3 78.0 - 100.0 fL   MCH 29.8 26.0 - 34.0 pg   MCHC 31.9 30.0 - 36.0 g/dL   RDW 78.4 69.6 - 29.5 %   Platelets 161 150 - 400 K/uL   Neutrophils Relative % 56 %   Neutro Abs 2.5 1.7 - 7.7 K/uL   Lymphocytes Relative 33 %   Lymphs Abs 1.4 0.7 - 4.0 K/uL   Monocytes Relative 5 %   Monocytes Absolute 0.2 0.1 - 1.0 K/uL   Eosinophils Relative 5 %   Eosinophils Absolute 0.2 0.0 - 0.7 K/uL   Basophils Relative 1 %   Basophils Absolute 0.0 0.0 - 0.1 K/uL  MRSA PCR Screening     Status: None   Collection Time: 04/16/16 11:00 PM  Result Value Ref Range   MRSA by PCR NEGATIVE NEGATIVE  CBC     Status: Abnormal   Collection Time: 04/17/16  3:59 AM  Result Value Ref Range   WBC 5.0 4.0 - 10.5 K/uL   RBC 3.66 (L) 3.87 - 5.11 MIL/uL   Hemoglobin 10.9 (L) 12.0 - 15.0 g/dL   HCT 28.4 (L) 13.2 - 44.0 %   MCV 92.3 78.0 - 100.0 fL   MCH 29.8 26.0 - 34.0 pg   MCHC 32.2 30.0 - 36.0 g/dL   RDW 10.2 72.5 - 36.6 %   Platelets 172 150 - 400 K/uL  Basic metabolic panel     Status: Abnormal   Collection Time: 04/17/16  3:59 AM  Result Value Ref Range   Sodium 138 135 - 145 mmol/L   Potassium 4.3 3.5 - 5.1 mmol/L   Chloride 101 101 - 111 mmol/L   CO2 27 22 - 32 mmol/L   Glucose, Bld 103 (H) 65 - 99 mg/dL   BUN 11 6 - 20 mg/dL   Creatinine, Ser 4.40 (H) 0.44 - 1.00 mg/dL   Calcium 8.9 8.9 - 34.7 mg/dL   GFR calc non Af Amer 54 (L) >60 mL/min   GFR calc Af Amer >60 >60 mL/min   Anion gap 10  5 - 15   Dg Chest 1 View  Result Date: 04/16/2016 CLINICAL DATA:  Pt c/o bilateral upper back pain, generalized right wrist pain, swelling, and bruising, generalized right elbow pain, and bilateral hip pain  after experiencing an unwitnessed fall this afternoon. No known hx of prior injuries or surgeries. No hx of heart or lung problems. Pt is a nonsmoker. EXAM: CHEST 1 VIEW COMPARISON:  12/29/2010 FINDINGS: The cardiac silhouette is normal in size and configuration. No mediastinal or hilar masses or evidence of adenopathy. Lungs are clear.  No pleural effusion or pneumothorax. Bony thorax is intact. IMPRESSION: No active disease. Electronically Signed   By: Amie Portland M.D.   On: 04/16/2016 17:02   Dg Thoracic Spine 2 View  Result Date: 04/16/2016 CLINICAL DATA:  Pt c/o bilateral upper back pain, generalized right wrist pain, swelling, and bruising, generalized right elbow pain, and bilateral hip pain after experiencing an unwitnessed fall this afternoon. No known hx of prior injuries or surgeries. No hx of heart or lung problems. Pt is a nonsmoker. EXAM: THORACIC SPINE 2 VIEWS COMPARISON:  Chest CT, 02/18/2011 FINDINGS: No fracture.  No spondylolisthesis.  No bone lesion. Bones are demineralized. There are mild disc degenerative changes centered in the mid thoracic spine. Soft tissues are unremarkable. IMPRESSION: 1. No fracture, spondylolisthesis or acute finding. Electronically Signed   By: Amie Portland M.D.   On: 04/16/2016 17:03   Dg Pelvis 1-2 Views  Result Date: 04/16/2016 CLINICAL DATA:  Bilateral upper back pain.  Status post fall. EXAM: PELVIS - 1-2 VIEW COMPARISON:  None. FINDINGS: Generalized osteopenia. No acute fracture or subluxation. 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 IMPRESSION: No acute osseous injury of the pelvis. Electronically Signed   By: Elige Ko   On: 04/16/2016 16:59   Dg Elbow Complete Right  Result Date: 04/16/2016 CLINICAL DATA:  Pt c/o bilateral upper back pain, generalized right wrist pain, swelling, and bruising, generalized right elbow pain, and bilateral hip pain after experiencing an unwitnessed fall this  afternoon. No known hx of prior injuries or surgeries. No hx of heart or lung problems. Pt is a nonsmoker. EXAM: RIGHT ELBOW - COMPLETE 3+ VIEW COMPARISON:  None. FINDINGS: No fracture or dislocation. Elbow joint normally spaced and aligned.  No joint effusion. Soft tissues are unremarkable. IMPRESSION: No fracture or dislocation. Electronically Signed   By: Amie Portland M.D.   On: 04/16/2016 16:58   Dg Wrist Complete Right  Result Date: 04/16/2016 CLINICAL DATA:  Pt c/o bilateral upper back pain, generalized right wrist pain, swelling, and bruising, generalized right elbow pain, and bilateral hip pain after experiencing an unwitnessed fall this afternoon. No known hx of prior injuries or surgeries. No hx of heart or lung problems. Pt is a nonsmoker. EXAM: RIGHT WRIST - COMPLETE 3+ VIEW COMPARISON:  None. FINDINGS: There is a comminuted fracture of the distal radius with an associated nondisplaced ulnar styloid fracture. The primary fracture is transverse across the metaphysis. There is a separate fracture component of the radial styloid and other fracture components intersecting the lunate facet than the dorsal aspect of the distal radial articular surface. Fracture is dorsally impacted leading to dorsal angulation of the distal radial articular surface of approximately 17 degrees. Bones are demineralized. There is no dislocation. There is diffuse surrounding soft tissue edema. IMPRESSION: 1. Comminuted, dorsally impacted fracture of the distal radial metaphysis with dorsal angulation of the distal radial articular surface. There is  associated ulnar styloid fracture. No dislocation. Electronically Signed   By: Amie Portland M.D.   On: 04/16/2016 17:05   Ct Head Without Contrast  Result Date: 04/17/2016 CLINICAL DATA:  Follow-up subarachnoid hemorrhage. EXAM: CT HEAD WITHOUT CONTRAST TECHNIQUE: Contiguous axial images were obtained from the base of the skull through the vertex without intravenous contrast.  COMPARISON:  Yesterday at 1606 hours FINDINGS: Brain: Parafalcine subarachnoid hemorrhage in the right frontal lobe is unchanged, small in volume. Subarachnoid hemorrhage in the lower left quadrigeminal plate is unchanged, small in volume. No new hemorrhage. No developing hydrocephalus. No acute ischemia. Stable brain volume. Vascular: No hyperdense vessel or unexpected calcification. Skull: No calvarial fracture. Probable sebaceous cyst in the midline and left frontal scalp. Sinuses/Orbits: Multiple facial fractures on the right, assessed on recent face CT. IMPRESSION: Stable small volume subarachnoid hemorrhage in the parafalcine right frontal lobe and left quadrigeminal plate cistern. No new hemorrhage or acute abnormality.  No hydrocephalus. Electronically Signed   By: Rubye Oaks M.D.   On: 04/17/2016 02:49   Ct Head Wo Contrast  Result Date: 04/16/2016 CLINICAL DATA:  Pain after trauma EXAM: CT HEAD WITHOUT CONTRAST CT MAXILLOFACIAL WITHOUT CONTRAST CT CERVICAL SPINE WITHOUT CONTRAST TECHNIQUE: Multidetector CT imaging of the head, cervical spine, and maxillofacial structures were performed using the standard protocol without intravenous contrast. Multiplanar CT image reconstructions of the cervical spine and maxillofacial structures were also generated. COMPARISON:  September 03, 2014 FINDINGS: CT HEAD FINDINGS There are multiple fractures in the right side of the face which will be described on the maxillofacial portion of the study. There is opacification of the right maxillary sinus. The remainder of the paranasal sinuses are normal. The mastoid air cells and middle ears are normal. There is soft tissue swelling in the right side of the face. The globes are intact. The remainder of the extracranial soft tissues are normal. There is high attenuation in the left side of the quadrigeminal plate cistern on series 3, image 11, consistent with subarachnoid hemorrhage. There is high attenuation in the  sulci of the medial right frontal lobe such as on series 3, image 13 and image 15. These findings are new since January 2016. No subdural or epidural hemorrhage. Cerebellum and brainstem are normal. Basal cisterns are patent. Ventricles and sulci are normal for age and contain no blood. No mass effect or midline shift. No cortical ischemia or infarct. CT MAXILLOFACIAL FINDINGS There are complicated fractures through the right facial bones. The roof and medial walls of the right orbit are intact. There are fractures through the lateral wall which are mildly displaced. Displaced fractures are also seen through the inferior wall of the right orbit. Blood and debris obscure the inferior rectus muscle but it appears to remain within the orbit with no evidence of entrapment. Displaced fractures are seen through the lateral wall of the right maxillary sinus extending into the floor. Anterior, posterior, lateral, inferior, and superior wall maxillary sinus fractures identified. The patient has 4 maxillary screws. The fractures through the floor of the right maxillary sinus appear to disrupt the cement around at least 1 of these right sided screws. The medial wall of the right maxillary sinus is intact. The pterygoid plates remain intact. There is a depressed fracture through the right zygomatic arch anteriorly and a nondisplaced fracture through the posterior right zygomatic arch. The left orbit is intact as is the left zygomatic arch. The mandible is also intact. CT CERVICAL SPINE FINDINGS C5 demonstrates retrolisthesis versus  both C4 and C6 with mild focal kyphosis. Retrolisthesis of C5 versus C4 is 2.5 mm which is similar to the retrolisthesis at C5-6. There is minimal anterolisthesis of T1 versus T2. No other malalignment. No fractures. Multilevel degenerative changes most marked at C5-6. Degenerative changes also seen with right greater than left C5-6 neural foraminal narrowing. No critical canal stenosis. IMPRESSION:  1. Subarachnoid hemorrhage in the sulci along the medial right frontal lobe and in the left side of the quadrigeminal plate cistern. 2. Complex fracture involving the lateral and inferior walls of the right orbit without evidence of entrapment of the inferior rectus muscle. There are fractures through the anterior, posterior superior, inferior and lateral walls of the right maxillary sinus. The maxillary fractures affect at least 1 of the 2 maxillary screws. Right zygomatic arch fractures are noted. 3. The malalignment in the cervical spine spine described above is favored to be degenerative with no identified fracture or soft tissue swelling. Findings called to Dr. Adela Lank Electronically Signed   By: Gerome Sam III M.D   On: 04/16/2016 16:43   Ct Cervical Spine Wo Contrast  Result Date: 04/16/2016 CLINICAL DATA:  Pain after trauma EXAM: CT HEAD WITHOUT CONTRAST CT MAXILLOFACIAL WITHOUT CONTRAST CT CERVICAL SPINE WITHOUT CONTRAST TECHNIQUE: Multidetector CT imaging of the head, cervical spine, and maxillofacial structures were performed using the standard protocol without intravenous contrast. Multiplanar CT image reconstructions of the cervical spine and maxillofacial structures were also generated. COMPARISON:  September 03, 2014 FINDINGS: CT HEAD FINDINGS There are multiple fractures in the right side of the face which will be described on the maxillofacial portion of the study. There is opacification of the right maxillary sinus. The remainder of the paranasal sinuses are normal. The mastoid air cells and middle ears are normal. There is soft tissue swelling in the right side of the face. The globes are intact. The remainder of the extracranial soft tissues are normal. There is high attenuation in the left side of the quadrigeminal plate cistern on series 3, image 11, consistent with subarachnoid hemorrhage. There is high attenuation in the sulci of the medial right frontal lobe such as on series 3, image  13 and image 15. These findings are new since January 2016. No subdural or epidural hemorrhage. Cerebellum and brainstem are normal. Basal cisterns are patent. Ventricles and sulci are normal for age and contain no blood. No mass effect or midline shift. No cortical ischemia or infarct. CT MAXILLOFACIAL FINDINGS There are complicated fractures through the right facial bones. The roof and medial walls of the right orbit are intact. There are fractures through the lateral wall which are mildly displaced. Displaced fractures are also seen through the inferior wall of the right orbit. Blood and debris obscure the inferior rectus muscle but it appears to remain within the orbit with no evidence of entrapment. Displaced fractures are seen through the lateral wall of the right maxillary sinus extending into the floor. Anterior, posterior, lateral, inferior, and superior wall maxillary sinus fractures identified. The patient has 4 maxillary screws. The fractures through the floor of the right maxillary sinus appear to disrupt the cement around at least 1 of these right sided screws. The medial wall of the right maxillary sinus is intact. The pterygoid plates remain intact. There is a depressed fracture through the right zygomatic arch anteriorly and a nondisplaced fracture through the posterior right zygomatic arch. The left orbit is intact as is the left zygomatic arch. The mandible is also intact. CT  CERVICAL SPINE FINDINGS C5 demonstrates retrolisthesis versus both C4 and C6 with mild focal kyphosis. Retrolisthesis of C5 versus C4 is 2.5 mm which is similar to the retrolisthesis at C5-6. There is minimal anterolisthesis of T1 versus T2. No other malalignment. No fractures. Multilevel degenerative changes most marked at C5-6. Degenerative changes also seen with right greater than left C5-6 neural foraminal narrowing. No critical canal stenosis. IMPRESSION: 1. Subarachnoid hemorrhage in the sulci along the medial right  frontal lobe and in the left side of the quadrigeminal plate cistern. 2. Complex fracture involving the lateral and inferior walls of the right orbit without evidence of entrapment of the inferior rectus muscle. There are fractures through the anterior, posterior superior, inferior and lateral walls of the right maxillary sinus. The maxillary fractures affect at least 1 of the 2 maxillary screws. Right zygomatic arch fractures are noted. 3. The malalignment in the cervical spine spine described above is favored to be degenerative with no identified fracture or soft tissue swelling. Findings called to Dr. Adela Lank Electronically Signed   By: Gerome Sam III M.D   On: 04/16/2016 16:43   Ct Maxillofacial Wo Cm  Result Date: 04/16/2016 CLINICAL DATA:  Pain after trauma EXAM: CT HEAD WITHOUT CONTRAST CT MAXILLOFACIAL WITHOUT CONTRAST CT CERVICAL SPINE WITHOUT CONTRAST TECHNIQUE: Multidetector CT imaging of the head, cervical spine, and maxillofacial structures were performed using the standard protocol without intravenous contrast. Multiplanar CT image reconstructions of the cervical spine and maxillofacial structures were also generated. COMPARISON:  September 03, 2014 FINDINGS: CT HEAD FINDINGS There are multiple fractures in the right side of the face which will be described on the maxillofacial portion of the study. There is opacification of the right maxillary sinus. The remainder of the paranasal sinuses are normal. The mastoid air cells and middle ears are normal. There is soft tissue swelling in the right side of the face. The globes are intact. The remainder of the extracranial soft tissues are normal. There is high attenuation in the left side of the quadrigeminal plate cistern on series 3, image 11, consistent with subarachnoid hemorrhage. There is high attenuation in the sulci of the medial right frontal lobe such as on series 3, image 13 and image 15. These findings are new since January 2016. No subdural  or epidural hemorrhage. Cerebellum and brainstem are normal. Basal cisterns are patent. Ventricles and sulci are normal for age and contain no blood. No mass effect or midline shift. No cortical ischemia or infarct. CT MAXILLOFACIAL FINDINGS There are complicated fractures through the right facial bones. The roof and medial walls of the right orbit are intact. There are fractures through the lateral wall which are mildly displaced. Displaced fractures are also seen through the inferior wall of the right orbit. Blood and debris obscure the inferior rectus muscle but it appears to remain within the orbit with no evidence of entrapment. Displaced fractures are seen through the lateral wall of the right maxillary sinus extending into the floor. Anterior, posterior, lateral, inferior, and superior wall maxillary sinus fractures identified. The patient has 4 maxillary screws. The fractures through the floor of the right maxillary sinus appear to disrupt the cement around at least 1 of these right sided screws. The medial wall of the right maxillary sinus is intact. The pterygoid plates remain intact. There is a depressed fracture through the right zygomatic arch anteriorly and a nondisplaced fracture through the posterior right zygomatic arch. The left orbit is intact as is the left zygomatic arch.  The mandible is also intact. CT CERVICAL SPINE FINDINGS C5 demonstrates retrolisthesis versus both C4 and C6 with mild focal kyphosis. Retrolisthesis of C5 versus C4 is 2.5 mm which is similar to the retrolisthesis at C5-6. There is minimal anterolisthesis of T1 versus T2. No other malalignment. No fractures. Multilevel degenerative changes most marked at C5-6. Degenerative changes also seen with right greater than left C5-6 neural foraminal narrowing. No critical canal stenosis. IMPRESSION: 1. Subarachnoid hemorrhage in the sulci along the medial right frontal lobe and in the left side of the quadrigeminal plate cistern. 2.  Complex fracture involving the lateral and inferior walls of the right orbit without evidence of entrapment of the inferior rectus muscle. There are fractures through the anterior, posterior superior, inferior and lateral walls of the right maxillary sinus. The maxillary fractures affect at least 1 of the 2 maxillary screws. Right zygomatic arch fractures are noted. 3. The malalignment in the cervical spine spine described above is favored to be degenerative with no identified fracture or soft tissue swelling. Findings called to Dr. Adela Lank Electronically Signed   By: Gerome Sam III M.D   On: 04/16/2016 16:43    Assessment/Plan: Diagnosis: SAH after fall with associated facial fx's, distal right radius fx 1. Does the need for close, 24 hr/day medical supervision in concert with the patient's rehab needs make it unreasonable for this patient to be served in a less intensive setting? Yes Co-Morbidities requiring supervision/potential complications: .multiple orhto issues, pain/ wound care,  Due to bladder management, bowel management, safety, skin/wound care, disease management, medication administration, pain management and patient education, does the patient require 24 hr/day rehab nursing? Yes 2. Does the patient require coordinated care of a physician, rehab nurse, PT (1-2 hrs/day, 5 days/week), OT (1-2 hrs/day, 5 days/week) and SLP (1-2 hrs/day, 5 days/week) to address physical and functional deficits in the context of the above medical diagnosis(es)? Yes Addressing deficits in the following areas: balance, endurance, locomotion, strength, transferring, bowel/bladder control, bathing, dressing, feeding, grooming, toileting, cognition, speech, language, swallowing and psychosocial support 3. Can the patient actively participate in an intensive therapy program of at least 3 hrs of therapy per day at least 5 days per week? Yes and Potentially 4. The potential for patient to make measurable gains while  on inpatient rehab is excellent 5. Anticipated functional outcomes upon discharge from inpatient rehab are modified independent  with PT, modified independent and supervision with OT, modified independent and supervision with SLP. 6. Estimated rehab length of stay to reach the above functional goals is: 7-12 days 7. Does the patient have adequate social supports and living environment to accommodate these discharge functional goals? Yes 8. Anticipated D/C setting: Home 9. Anticipated post D/C treatments: HH therapy 10. Overall Rehab/Functional Prognosis: excellent  RECOMMENDATIONS: This patient's condition is appropriate for continued rehabilitative care in the following setting: CIR Patient has agreed to participate in recommended program. Potentially Note that insurance prior authorization may be required for reimbursement for recommended care.  Comment: Rehab Admissions Coordinator to follow up.  Thanks,  Ranelle Oyster, MD, [place] [facility]     04/17/2016

## 2016-04-17 NOTE — Evaluation (Signed)
Occupational Therapy Evaluation Patient Details Name: Jillian Morris MRN: [id number] DOB: [birth year] Today's Date: 04/17/2016    History of Present Illness Patient with a ground level fall in the hallway of her ophthalmologist office.  Brief LOC of minutes. Pt with R distal radius fx and oribital fx. CT on 8/25 + for stable small volume subarachnoid hemorrhage in the parafalcine right frontal lobe and left quadrigeminal plate cistern. PMHx: Anxiety, Arthritis, Chronic lower back pain, Depression, Fibromyalgia, GERD, Graves disease, IBS, Sjogren's syndrome, Stroke.    Clinical Impression   Pt reports she was independent with ADL PTA but has noticed some confusion and cognitive changes recently. Pt currently requires mod assist +2 for safety with functional mobility and min assist overall for ADL. Pt presenting with impaired cognition, decreased balance in standing, and horizontal diplopia impacting her independence and safety with ADL and functional mobility. Recommending CIR level therapies for follow up to maximize independence and safety with ADL and functional mobility prior to return home. Pt would benefit from continued skilled OT to address established goals.     Follow Up Recommendations  CIR;Supervision/Assistance - 24 hour    Equipment Recommendations  3 in 1 bedside comode    Recommendations for Other Services       Precautions / Restrictions Precautions Precautions: Fall Restrictions Weight Bearing Restrictions: Yes RUE Weight Bearing:  (No orders but kepts pt NWB throughout session) Other Position/Activity Restrictions: RUE sling      Mobility Bed Mobility Overal bed mobility: Needs Assistance Bed Mobility: Supine to Sit     Supine to sit: Min guard     General bed mobility comments: min guard for safety  Transfers Overall transfer level: Needs assistance Equipment used: 1 person hand held assist Transfers: Sit to/from Stand Sit to Stand: Min assist          General transfer comment: min assist for balance and stability when elevating to upright    Balance Overall balance assessment: History of Falls;Needs assistance Sitting-balance support: Feet supported;No upper extremity supported Sitting balance-Leahy Scale: Good     Standing balance support: Single extremity supported Standing balance-Leahy Scale: Poor Standing balance comment: Requires single UE support for balance in standing.                            ADL Overall ADL's : Needs assistance/impaired Eating/Feeding: Set up;Sitting   Grooming: Minimal assistance;Sitting   Upper Body Bathing: Minimal assitance;Sitting   Lower Body Bathing: Minimal assistance;Sit to/from stand   Upper Body Dressing : Minimal assistance;Sitting Upper Body Dressing Details (indicate cue type and reason): for donning gown Lower Body Dressing: Minimal assistance;Sit to/from stand Lower Body Dressing Details (indicate cue type and reason): Min assist to don socks sitting EOB. Pt able to cross foot over opposite knee to assist with donning socks. Toilet Transfer: Moderate assistance;Ambulation;BSC;+2 for safety/equipment Toilet Transfer Details (indicate cue type and reason): Simulated by sit to stand from EOB with functional mobility in room. Toileting- Clothing Manipulation and Hygiene: Minimal assistance;Sit to/from stand       Functional mobility during ADLs: Moderate assistance;+2 for safety/equipment (1 person hand held assist) General ADL Comments: Pt reports intermittent confusion over the past few weeks; can identify two instances when she was out driving and got lost/"turned around".      Vision Vision Assessment?: Yes Diplopia Assessment: Disappears with one eye closed;Objects split side to side;Present in near gaze   Perception     Praxis  Pertinent Vitals/Pain Pain Assessment: 0-10 Pain Score: 3  Pain Location: R UE Pain Descriptors / Indicators:  Aching;Sore Pain Intervention(s): Monitored during session;Premedicated before session;Repositioned     Hand Dominance Right   Extremity/Trunk Assessment Upper Extremity Assessment Upper Extremity Assessment: RUE deficits/detail RUE Deficits / Details: Increased edema noted in digits. Pt reports sensation intact. Shoulder AROM WFL.  RUE: Unable to fully assess due to immobilization   Lower Extremity Assessment Lower Extremity Assessment: Defer to PT evaluation   Cervical / Trunk Assessment Cervical / Trunk Assessment: Normal   Communication Communication Communication: No difficulties   Cognition Arousal/Alertness: Awake/alert Behavior During Therapy: Flat affect;Impulsive Overall Cognitive Status: Impaired/Different from baseline Area of Impairment: Attention;Memory;Following commands;Safety/judgement;Awareness;Problem solving   Current Attention Level: Selective Memory: Decreased short-term memory Following Commands: Follows one step commands with increased time Safety/Judgement: Decreased awareness of safety;Decreased awareness of deficits Awareness: Emergent Problem Solving: Slow processing;Decreased initiation;Difficulty sequencing;Requires verbal cues;Requires tactile cues General Comments: patient tangential throughout session, also acknowledges some various degress of baseline confusion over the past several weeks. Patient indicated that she started using a cane because she 'got lost driving to her hair appointment' and does not comprehend while this correlation does not appear fitting   General Comments       Exercises       Shoulder Instructions      Home Living Family/patient expects to be discharged to:: Private residence Living Arrangements: Spouse/significant other Available Help at Discharge: Family Type of Home: House Home Access: Level entry     Home Layout: One level     Bathroom Shower/Tub:  (sponge bathes)   Bathroom Toilet: Handicapped  height (one standard)     Home Equipment: Cane - single point          Prior Functioning/Environment Level of Independence: Independent with assistive device(s)        Comments: SPC for mobility     OT Diagnosis: Generalized weakness;Cognitive deficits;Disturbance of vision;Acute pain   OT Problem List: Decreased strength;Decreased range of motion;Decreased activity tolerance;Impaired balance (sitting and/or standing);Impaired vision/perception;Decreased cognition;Decreased safety awareness;Decreased knowledge of use of DME or AE;Decreased knowledge of precautions;Impaired UE functional use;Pain;Increased edema   OT Treatment/Interventions: Self-care/ADL training;Neuromuscular education;Energy conservation;DME and/or AE instruction;Therapeutic activities;Cognitive remediation/compensation;Visual/perceptual remediation/compensation;Patient/family education;Balance training    OT Goals(Current goals can be found in the care plan section) Acute Rehab OT Goals Patient Stated Goal: get better OT Goal Formulation: With patient Time For Goal Achievement: 05/01/16 Potential to Achieve Goals: Good  OT Frequency: Min 2X/week   Barriers to D/C:            Co-evaluation PT/OT/SLP Co-Evaluation/Treatment: Yes Reason for Co-Treatment: Complexity of the patient's impairments (multi-system involvement) PT goals addressed during session: Mobility/safety with mobility OT goals addressed during session: ADL's and self-care      End of Session Equipment Utilized During Treatment: Gait belt;Other (comment) (sling) Nurse Communication: Mobility status  Activity Tolerance: Patient tolerated treatment well Patient left: in chair;with call bell/phone within reach;with family/visitor present   Time: [id number] OT Time Calculation (min): 37 min Charges:  OT General Charges $OT Visit: 1 Procedure OT Evaluation $OT Eval Moderate Complexity: 1 Procedure G-Codes: OT G-codes **NOT FOR INPATIENT  CLASS** Functional Assessment Tool Used: Clinical judgement Functional Limitation: Self care Self Care Current Status (V4098(G8987): At least 20 percent but less than 40 percent impaired, limited or restricted Self Care Goal Status (J1914(G8988): At least 1 percent but less than 20 percent impaired, limited or restricted   Gaye AlkenBailey A Rashed Edler M.S., OTR/L  Pager: [phone]  04/17/2016, 11:36 AM

## 2016-04-17 NOTE — Evaluation (Signed)
Speech Language Pathology Evaluation Patient Details Name: Jillian Morris MRN: [id number] DOB: 05/05/1946 Today's Date: 04/17/2016 Time: [id number] SLP Time Calculation (min) (ACUTE ONLY): 18 min  Problem List:  Patient Active Problem List   Diagnosis Date Noted  . SAH (subarachnoid hemorrhage) (HCC) 04/16/2016  . Achalasia 08/09/2013  . Stricture and stenosis of esophagus 03/30/2012  . Dysphagia, unspecified(787.20) 03/21/2012  . Special screening for malignant neoplasms, colon 03/21/2012  . Esophageal reflux 03/21/2012   Past Medical History:  Past Medical History:  Diagnosis Date  . Acne rosacea   . Anemia    s/p gastric bypass, malabsorption  . Anxiety   . Arthritis   . B12 deficiency   . Chronic fatigue fibromyalgia syndrome   . Chronic low back pain   . Depression   . Fibromyalgia   . GERD (gastroesophageal reflux disease)   . Graves disease   . H/O hiatal hernia   . Hypothyroidism   . IBS (irritable bowel syndrome)   . Lymphedema   . Meralgia paraesthetica   . Sjogren's syndrome (HCC)   . Status post dilation of esophageal narrowing   . Stroke [facility](HCC)    Past Surgical History:  Past Surgical History:  Procedure Laterality Date  . ABDOMINAL HYSTERECTOMY    . APPENDECTOMY    . BALLOON DILATION  03/30/2012   Procedure: BALLOON DILATION;  Surgeon: Louis Meckelobert D Kaplan, MD;  Location: [facility];  Service: Endoscopy;  Laterality: N/A;  . BOTOX INJECTION N/A 12/26/2014   Procedure: BOTOX INJECTION;  Surgeon: Louis Meckelobert D Kaplan, MD;  Location: [facility];  Service: Endoscopy;  Laterality: N/A;  . ENDOMETRIAL ABLATION     x 4  . ESOPHAGEAL MANOMETRY N/A 05/15/2013   Procedure: ESOPHAGEAL MANOMETRY (EM);  Surgeon: Louis Meckelobert D Kaplan, MD;  Location: [facility];  Service: Endoscopy;  Laterality: N/A;  . ESOPHAGOGASTRODUODENOSCOPY  03/30/2012   Procedure: ESOPHAGOGASTRODUODENOSCOPY (EGD);  Surgeon: Louis Meckelobert D Kaplan, MD;  [facility];  Service: Endoscopy;  Laterality:  N/A;  . ESOPHAGOGASTRODUODENOSCOPY  05/04/2012   Procedure: ESOPHAGOGASTRODUODENOSCOPY (EGD);  Surgeon: Louis Meckelobert D Kaplan, MD;  [facility];  Service: Endoscopy;  Laterality: N/A;  . ESOPHAGOGASTRODUODENOSCOPY N/A 12/26/2014   Procedure: ESOPHAGOGASTRODUODENOSCOPY (EGD);  Surgeon: Louis Meckelobert D Kaplan, MD;  [facility];  Service: Endoscopy;  Laterality: N/A;  botox injection  . GASTRIC BYPASS    . LAMINECTOMY     L3-L5  . 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 THYROIDECTOMY     HPI:  Patient with a ground level fall in the hallway of her ophthalmologist office.  Brief LOC of minutes. Pt with R distal radius fx and oribital fx. CT on 8/25 + for stable small volume subarachnoid hemorrhage in the parafalcine right frontal lobe and left quadrigeminal plate cistern. PMHx: Anxiety, Arthritis, Chronic lower back pain, Depression, Fibromyalgia, GERD, Graves disease, IBS, Sjogren's syndrome, Stroke.    Assessment / Plan / Recommendation Clinical Impression  Pt presents with cognitive impairments that include decreased retrieval of new information and poor intellectual and emergent awareness of deficits. Safety awareness and verbal problem solving noted to be impaired during functional conversation regarding d/c planning. Recommend additional SLP f/u to maximize safety and functional independence.    SLP Assessment  Patient needs continued Speech Lanaguage Pathology Services    Follow Up Recommendations  Inpatient Rehab    Frequency and Duration min 2x/week  2 weeks      SLP Evaluation Prior Functioning  Cognitive/Linguistic Baseline: Baseline deficits Baseline deficit details:  pt reports some trouble with memory PTA but was previously evaluated and told that her cognition was [facility] Type of Home: House  Lives With: Spouse Available Help at Discharge: Family   Cognition  Overall Cognitive Status: Impaired/Different from baseline Arousal/Alertness: Awake/alert Orientation  Level: Oriented X4 Attention: Sustained Sustained Attention: Impaired Sustained Attention Impairment: Verbal basic Memory: Impaired Memory Impairment: Decreased recall of new information;Retrieval deficit Awareness: Impaired Awareness Impairment: Intellectual impairment;Emergent impairment;Anticipatory impairment Problem Solving: Impaired Problem Solving Impairment: Verbal basic Safety/Judgment: Impaired    Comprehension  Auditory Comprehension Overall Auditory Comprehension: Appears within functional limits for tasks assessed (with basic, functional tasks)    Expression Expression Primary Mode of Expression: Verbal Verbal Expression Overall Verbal Expression: Appears within functional limits for tasks assessed   Oral / Motor  Motor Speech Overall Motor Speech: Appears within functional limits for tasks assessed   GO          Functional Assessment Tool Used: skilled clinical judgment Functional Limitations: Memory Memory Current Status (Q6578): At least 60 percent but less than 80 percent impaired, limited or restricted Memory Goal Status (I6962): At least 40 percent but less than 60 percent impaired, limited or restricted         Jillian Morris 04/17/2016, 3:15 PM  Jillian Morris, M.A. CCC-SLP [phone]

## 2016-04-17 NOTE — Progress Notes (Signed)
Rehab Admissions Coordinator Note:  Patient was screened by Trish MageLogue, Sathvika Ojo M for appropriateness for an Inpatient Acute Rehab Consult.  At this time, we are recommending Inpatient Rehab consult.  Trish MageLogue, Harlie Buening M 04/17/2016, 1:58 PM  I can be reached at [phone].

## 2016-04-17 NOTE — Progress Notes (Signed)
Patient ID: Elie GoodyBrenda L Smedberg, female   DOB: 07/23/1946, 70 y.o.   MRN: [id number]    Subjective: C/O some double vision, tolerated clears  Objective: Vital signs in last 24 hours: Temp:  [98.8 F (37.1 C)-99.2 F (37.3 C)] 99.2 F (37.3 C) (08/25 0800) Pulse Rate:  [82-100] 82 (08/25 0800) Resp:  [9-21] 9 (08/25 0800) BP: (103-163)/(66-100) 137/67 (08/25 0800) SpO2:  [92 %-99 %] 95 % (08/25 0800) Weight:  [85.7 kg (188 lb 15 oz)] 85.7 kg (188 lb 15 oz) (08/24 2300) Last BM Date: 04/15/16  Intake/Output from previous day: 08/24 0701 - 08/25 0700 In: 1323.3 [I.V.:1323.3] Out: -  Intake/Output this shift: Total I/O In: 50 [I.V.:50] Out: -   General appearance: cooperative Head: right facial edema and ecchymoses Eyes: PERL, gaze appears disconjugate ahen looks up or medially Resp: clear to auscultation bilaterally Cardio: regular rate and rhythm GI: soft, NT, ND Neuro: oriented and F/C  Ext: R wrist splint  Lab Results: CBC   Recent Labs  04/16/16 1536 04/17/16 0359  WBC 4.4 5.0  HGB 11.9* 10.9*  HCT 37.3 33.8*  PLT 161 172   BMET  Recent Labs  04/16/16 1536 04/17/16 0359  NA 139 138  K 4.2 4.3  CL 103 101  CO2 27 27  GLUCOSE 112* 103*  BUN 11 11  CREATININE 1.28* 1.02*  CALCIUM 9.3 8.9   Anti-infectives: Anti-infectives    None      Assessment/Plan: Fall TBI/SAH - stable on F/U CT, Dr. Venetia MaxonStern consulting R wrist FX - Dr. Merlyn LotKuzma evaluted and will see her in the office next week R orbit/maxillary sinus/zygoma FXs - Dr. Suszanne Connerseoh to consult FEN - clears pending ENT eval VTE - PAS Dispo - may be able to TF out of ICU later today   LOS: 0 days    Violeta GelinasBurke Neelie Welshans, MD, MPH, FACS Trauma: [phone] General Surgery: [phone]  04/17/2016

## 2016-04-17 NOTE — Consult Note (Signed)
Reason for Consult: Facial and orbital fractures Referring Physician: Violeta Gelinas, MD  HPI:  Jillian Morris is an 70 y.o. female who was admitted yesterday after a fall. She was reportedly walking into a doctor's office, when her right foot caught on the carpet and she fell.  She landed on her face and right hand.  Nurses on scene reported she was initially unconscious, then became more arrousable, but was slow to respond and confused. She slowly regained normal mentation during EMS transport.  She does not remember anything after the fall until EMS had arrived. The patient now complains of diplopia. Her CT shows right tripod fractures. The CT shows complex fractures involving the lateral and inferior walls of the right orbit without evidence of entrapment of the inferior rectus muscle. There are fractures through the anterior, posterior superior, inferior and lateral walls of the right maxillary sinus.  Right zygomatic arch fractures are also noted.  Past Medical History:  Diagnosis Date  . Acne rosacea   . Anemia    s/p gastric bypass, malabsorption  . Anxiety   . Arthritis   . B12 deficiency   . Chronic fatigue fibromyalgia syndrome   . Chronic low back pain   . Depression   . Fibromyalgia   . GERD (gastroesophageal reflux disease)   . Graves disease   . H/O hiatal hernia   . Hypothyroidism   . IBS (irritable bowel syndrome)   . Lymphedema   . Meralgia paraesthetica   . Sjogren's syndrome (HCC)   . Status post dilation of esophageal narrowing   . Stroke [facility])     Past Surgical History:  Procedure Laterality Date  . ABDOMINAL HYSTERECTOMY    . APPENDECTOMY    . BALLOON DILATION  03/30/2012   Procedure: BALLOON DILATION;  Surgeon: Louis Meckel, MD;  Location: [facility] ENDOSCOPY;  Service: Endoscopy;  Laterality: N/A;  . BOTOX INJECTION N/A 12/26/2014   Procedure: BOTOX INJECTION;  Surgeon: Louis Meckel, MD;  Location: [facility]OS[facility];  Service: Endoscopy;  Laterality: N/A;  .  ENDOMETRIAL ABLATION     x 4  . ESOPHAGEAL MANOMETRY N/A 05/15/2013   Procedure: ESOPHAGEAL MANOMETRY (EM);  Surgeon: Louis Meckel, MD;  Location: [facility]OSCOPY;  Service: Endoscopy;  Laterality: N/A;  . ESOPHAGOGASTRODUODENOSCOPY  03/30/2012   Procedure: ESOPHAGOGASTRODUODENOSCOPY (EGD);  Surgeon: Louis Meckel, MD;  Location: [facility];  Service: Endoscopy;  Laterality: N/A;  . ESOPHAGOGASTRODUODENOSCOPY  05/04/2012   Procedure: ESOPHAGOGASTRODUODENOSCOPY (EGD);  Surgeon: Louis Meckel, MD;  Location: [facility];  Service: Endoscopy;  Laterality: N/A;  . ESOPHAGOGASTRODUODENOSCOPY N/A 12/26/2014   Procedure: ESOPHAGOGASTRODUODENOSCOPY (EGD);  Surgeon: Louis Meckel, MD;  Location: [facility];  Service: Endoscopy;  Laterality: N/A;  botox injection  . GASTRIC BYPASS    . LAMINECTOMY     L3-L5  . 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 THYROIDECTOMY      Family History  Problem Relation Age of Onset  . Prostate cancer Maternal Grandfather   . Lymphoma Maternal Uncle   . Heart disease Father   . Heart disease Sister   . Irritable bowel syndrome Sister   . Dementia Mother   . Stroke Maternal Grandmother   . Appendicitis Paternal Grandmother   . Colon cancer Neg Hx     Social History:  reports that she has never smoked. She has never used smokeless tobacco. She reports that she does not drink alcohol or use drugs.  Allergies:  Allergies  Allergen Reactions  . Actonel [Risedronate Sodium]     GI Upset  . Ambien [Zolpidem Tartrate] Other (See Comments)    Went crazy, hallucinations  . Augmentin [Amoxicillin-Pot Clavulanate] Other (See Comments)    GI upset  . Depakote [Divalproex Sodium]     Hallucinations  . Inderal [Propranolol] Other (See Comments)    unknown  . Nsaids Other (See Comments)    Pt. Had gastric bypass surgery   . Penicillins Other (See Comments)    Unknown childhood reaction  . Silicon Nausea And Vomiting    Thyroid storm  . Adhesive [Tape]  Itching and Rash    Please use "paper" tape  . Latex Swelling and Rash    Redness lips  . Nickel Itching and Rash    Other reaction(s): Contact Dermatitis (intolerance)    Prior to Admission medications   Medication Sig Start Date End Date Taking? Authorizing Provider  ALPRAZolam (XANAX) 0.25 MG tablet Take 0.25 mg by mouth 2 (two) times daily as needed (for panic attacks).  03/04/16  Yes Historical Provider, MD  cyanocobalamin (,VITAMIN B-12,) 1000 MCG/ML injection Inject 1,000 mcg into the muscle every 30 (thirty) days.    Yes Historical Provider, MD  DULoxetine (CYMBALTA) 60 MG capsule Take 60 mg by mouth 2 (two) times daily. 06/11/10  Yes Historical Provider, MD  lamoTRIgine (LAMICTAL) 100 MG tablet Take 300 mg by mouth at bedtime. 03/10/16  Yes Historical Provider, MD  lidocaine (LIDODERM) 5 % Place 1 patch onto the skin daily. Remove & Discard patch within 12 hours or as directed by MD   Yes Historical Provider, MD  LORazepam (ATIVAN) 1 MG tablet Take 1 mg by mouth at bedtime.    Yes Historical Provider, MD  metroNIDAZOLE (METROCREAM) 0.75 % cream Apply 1 application topically 2 (two) times daily.  10/06/10  Yes Historical Provider, MD  morphine (MS CONTIN) 30 MG 12 hr tablet Take 30 mg by mouth every 12 (twelve) hours.  03/10/16  Yes Historical Provider, MD  mupirocin cream (BACTROBAN) 2 % 1 application. 10/06/10  Yes Historical Provider, MD  oxymorphone (OPANA) 10 MG tablet Take 10 mg by mouth every 6 (six) hours as needed for pain.   Yes Historical Provider, MD  ranitidine (ZANTAC) 300 MG tablet Take 300 mg by mouth at bedtime.  04/14/16  Yes Historical Provider, MD  rOPINIRole (REQUIP) 1 MG tablet 1 TABLET (1 mg) BEFORE BEDTIME as needed for restless leg syndrome 02/22/16  Yes Historical Provider, MD  SYNTHROID 125 MCG tablet TAKE 1 TABLET (125 mcg) BY MOUTH EVERY MORNING ON EMPTY STOMACH ONCE DAILY 02/17/16  Yes Historical Provider, MD  dexlansoprazole (DEXILANT) 60 MG capsule TAKE 1 CAPSULE  (60 MG TOTAL) BY MOUTH DAILY. Patient not taking: Reported on 04/16/2016 11/21/13   Louis Meckel, MD    Medications:  I have reviewed the patient's current medications. Scheduled: . [START ON 04/30/2016] cyanocobalamin  1,000 mcg Intramuscular Q30 days  . DULoxetine  60 mg Oral BID  . famotidine  20 mg Oral Daily  . lamoTRIgine  300 mg Oral QHS  . levothyroxine  125 mcg Oral QAC breakfast  . LORazepam  1 mg Oral QHS  . morphine  30 mg Oral Q12H   ZOX:[id number], HYDROmorphone (DILAUDID) injection, morphine, ondansetron **OR** ondansetron (ZOFRAN) IV, rOPINIRole  Dg Chest 1 View  Result Date: 04/16/2016 CLINICAL DATA:  Pt c/o bilateral upper back pain, generalized right wrist pain, swelling, and bruising, generalized right elbow pain, and bilateral hip pain  after experiencing an unwitnessed fall this afternoon. No known hx of prior injuries or surgeries. No hx of heart or lung problems. Pt is a nonsmoker. EXAM: CHEST 1 VIEW COMPARISON:  12/29/2010 FINDINGS: The cardiac silhouette is normal in size and configuration. No mediastinal or hilar masses or evidence of adenopathy. Lungs are clear.  No pleural effusion or pneumothorax. Bony thorax is intact. IMPRESSION: No active disease. Electronically Signed   By: Amie Portlandavid  Ormond M.D.   On: 04/16/2016 17:02   Dg Thoracic Spine 2 View  Result Date: 04/16/2016 CLINICAL DATA:  Pt c/o bilateral upper back pain, generalized right wrist pain, swelling, and bruising, generalized right elbow pain, and bilateral hip pain after experiencing an unwitnessed fall this afternoon. No known hx of prior injuries or surgeries. No hx of heart or lung problems. Pt is a nonsmoker. EXAM: THORACIC SPINE 2 VIEWS COMPARISON:  Chest CT, 02/18/2011 FINDINGS: No fracture.  No spondylolisthesis.  No bone lesion. Bones are demineralized. There are mild disc degenerative changes centered in the mid thoracic spine. Soft tissues are unremarkable. IMPRESSION: 1. No fracture,  spondylolisthesis or acute finding. Electronically Signed   By: Amie Portlandavid  Ormond M.D.   On: 04/16/2016 17:03   Dg Pelvis 1-2 Views  Result Date: 04/16/2016 CLINICAL DATA:  Bilateral upper back pain.  Status post fall. EXAM: PELVIS - 1-2 VIEW COMPARISON:  None. FINDINGS: Generalized osteopenia. No acute fracture or subluxation. 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 IMPRESSION: No acute osseous injury of the pelvis. Electronically Signed   By: Elige KoHetal  Patel   On: 04/16/2016 16:59   Dg Elbow Complete Right  Result Date: 04/16/2016 CLINICAL DATA:  Pt c/o bilateral upper back pain, generalized right wrist pain, swelling, and bruising, generalized right elbow pain, and bilateral hip pain after experiencing an unwitnessed fall this afternoon. No known hx of prior injuries or surgeries. No hx of heart or lung problems. Pt is a nonsmoker. EXAM: RIGHT ELBOW - COMPLETE 3+ VIEW COMPARISON:  None. FINDINGS: No fracture or dislocation. Elbow joint normally spaced and aligned.  No joint effusion. Soft tissues are unremarkable. IMPRESSION: No fracture or dislocation. Electronically Signed   By: Amie Portlandavid  Ormond M.D.   On: 04/16/2016 16:58   Dg Wrist Complete Right  Result Date: 04/16/2016 CLINICAL DATA:  Pt c/o bilateral upper back pain, generalized right wrist pain, swelling, and bruising, generalized right elbow pain, and bilateral hip pain after experiencing an unwitnessed fall this afternoon. No known hx of prior injuries or surgeries. No hx of heart or lung problems. Pt is a nonsmoker. EXAM: RIGHT WRIST - COMPLETE 3+ VIEW COMPARISON:  None. FINDINGS: There is a comminuted fracture of the distal radius with an associated nondisplaced ulnar styloid fracture. The primary fracture is transverse across the metaphysis. There is a separate fracture component of the radial styloid and other fracture components intersecting the lunate facet than the dorsal aspect of the  distal radial articular surface. Fracture is dorsally impacted leading to dorsal angulation of the distal radial articular surface of approximately 17 degrees. Bones are demineralized. There is no dislocation. There is diffuse surrounding soft tissue edema. IMPRESSION: 1. Comminuted, dorsally impacted fracture of the distal radial metaphysis with dorsal angulation of the distal radial articular surface. There is associated ulnar styloid fracture. No dislocation. Electronically Signed   By: Amie Portlandavid  Ormond M.D.   On: 04/16/2016 17:05   Ct Head Without Contrast  Result Date: 04/17/2016 CLINICAL DATA:  Follow-up subarachnoid hemorrhage. EXAM:  CT HEAD WITHOUT CONTRAST TECHNIQUE: Contiguous axial images were obtained from the base of the skull through the vertex without intravenous contrast. COMPARISON:  Yesterday at 1606 hours FINDINGS: Brain: Parafalcine subarachnoid hemorrhage in the right frontal lobe is unchanged, small in volume. Subarachnoid hemorrhage in the lower left quadrigeminal plate is unchanged, small in volume. No new hemorrhage. No developing hydrocephalus. No acute ischemia. Stable brain volume. Vascular: No hyperdense vessel or unexpected calcification. Skull: No calvarial fracture. Probable sebaceous cyst in the midline and left frontal scalp. Sinuses/Orbits: Multiple facial fractures on the right, assessed on recent face CT. IMPRESSION: Stable small volume subarachnoid hemorrhage in the parafalcine right frontal lobe and left quadrigeminal plate cistern. No new hemorrhage or acute abnormality.  No hydrocephalus. Electronically Signed   By: Rubye Oaks M.D.   On: 04/17/2016 02:49   Ct Head Wo Contrast  Result Date: 04/16/2016 CLINICAL DATA:  Pain after trauma EXAM: CT HEAD WITHOUT CONTRAST CT MAXILLOFACIAL WITHOUT CONTRAST CT CERVICAL SPINE WITHOUT CONTRAST TECHNIQUE: Multidetector CT imaging of the head, cervical spine, and maxillofacial structures were performed using the standard  protocol without intravenous contrast. Multiplanar CT image reconstructions of the cervical spine and maxillofacial structures were also generated. COMPARISON:  September 03, 2014 FINDINGS: CT HEAD FINDINGS There are multiple fractures in the right side of the face which will be described on the maxillofacial portion of the study. There is opacification of the right maxillary sinus. The remainder of the paranasal sinuses are normal. The mastoid air cells and middle ears are normal. There is soft tissue swelling in the right side of the face. The globes are intact. The remainder of the extracranial soft tissues are normal. There is high attenuation in the left side of the quadrigeminal plate cistern on series 3, image 11, consistent with subarachnoid hemorrhage. There is high attenuation in the sulci of the medial right frontal lobe such as on series 3, image 13 and image 15. These findings are new since January 2016. No subdural or epidural hemorrhage. Cerebellum and brainstem are normal. Basal cisterns are patent. Ventricles and sulci are normal for age and contain no blood. No mass effect or midline shift. No cortical ischemia or infarct. CT MAXILLOFACIAL FINDINGS There are complicated fractures through the right facial bones. The roof and medial walls of the right orbit are intact. There are fractures through the lateral wall which are mildly displaced. Displaced fractures are also seen through the inferior wall of the right orbit. Blood and debris obscure the inferior rectus muscle but it appears to remain within the orbit with no evidence of entrapment. Displaced fractures are seen through the lateral wall of the right maxillary sinus extending into the floor. Anterior, posterior, lateral, inferior, and superior wall maxillary sinus fractures identified. The patient has 4 maxillary screws. The fractures through the floor of the right maxillary sinus appear to disrupt the cement around at least 1 of these right  sided screws. The medial wall of the right maxillary sinus is intact. The pterygoid plates remain intact. There is a depressed fracture through the right zygomatic arch anteriorly and a nondisplaced fracture through the posterior right zygomatic arch. The left orbit is intact as is the left zygomatic arch. The mandible is also intact. CT CERVICAL SPINE FINDINGS C5 demonstrates retrolisthesis versus both C4 and C6 with mild focal kyphosis. Retrolisthesis of C5 versus C4 is 2.5 mm which is similar to the retrolisthesis at C5-6. There is minimal anterolisthesis of T1 versus T2. No other malalignment. No fractures.  Multilevel degenerative changes most marked at C5-6. Degenerative changes also seen with right greater than left C5-6 neural foraminal narrowing. No critical canal stenosis. IMPRESSION: 1. Subarachnoid hemorrhage in the sulci along the medial right frontal lobe and in the left side of the quadrigeminal plate cistern. 2. Complex fracture involving the lateral and inferior walls of the right orbit without evidence of entrapment of the inferior rectus muscle. There are fractures through the anterior, posterior superior, inferior and lateral walls of the right maxillary sinus. The maxillary fractures affect at least 1 of the 2 maxillary screws. Right zygomatic arch fractures are noted. 3. The malalignment in the cervical spine spine described above is favored to be degenerative with no identified fracture or soft tissue swelling. Findings called to Dr. Adela Lank Electronically Signed   By: Gerome Sam III M.D   On: 04/16/2016 16:43   Ct Cervical Spine Wo Contrast  Result Date: 04/16/2016 CLINICAL DATA:  Pain after trauma EXAM: CT HEAD WITHOUT CONTRAST CT MAXILLOFACIAL WITHOUT CONTRAST CT CERVICAL SPINE WITHOUT CONTRAST TECHNIQUE: Multidetector CT imaging of the head, cervical spine, and maxillofacial structures were performed using the standard protocol without intravenous contrast. Multiplanar CT image  reconstructions of the cervical spine and maxillofacial structures were also generated. COMPARISON:  September 03, 2014 FINDINGS: CT HEAD FINDINGS There are multiple fractures in the right side of the face which will be described on the maxillofacial portion of the study. There is opacification of the right maxillary sinus. The remainder of the paranasal sinuses are normal. The mastoid air cells and middle ears are normal. There is soft tissue swelling in the right side of the face. The globes are intact. The remainder of the extracranial soft tissues are normal. There is high attenuation in the left side of the quadrigeminal plate cistern on series 3, image 11, consistent with subarachnoid hemorrhage. There is high attenuation in the sulci of the medial right frontal lobe such as on series 3, image 13 and image 15. These findings are new since January 2016. No subdural or epidural hemorrhage. Cerebellum and brainstem are normal. Basal cisterns are patent. Ventricles and sulci are normal for age and contain no blood. No mass effect or midline shift. No cortical ischemia or infarct. CT MAXILLOFACIAL FINDINGS There are complicated fractures through the right facial bones. The roof and medial walls of the right orbit are intact. There are fractures through the lateral wall which are mildly displaced. Displaced fractures are also seen through the inferior wall of the right orbit. Blood and debris obscure the inferior rectus muscle but it appears to remain within the orbit with no evidence of entrapment. Displaced fractures are seen through the lateral wall of the right maxillary sinus extending into the floor. Anterior, posterior, lateral, inferior, and superior wall maxillary sinus fractures identified. The patient has 4 maxillary screws. The fractures through the floor of the right maxillary sinus appear to disrupt the cement around at least 1 of these right sided screws. The medial wall of the right maxillary sinus is  intact. The pterygoid plates remain intact. There is a depressed fracture through the right zygomatic arch anteriorly and a nondisplaced fracture through the posterior right zygomatic arch. The left orbit is intact as is the left zygomatic arch. The mandible is also intact. CT CERVICAL SPINE FINDINGS C5 demonstrates retrolisthesis versus both C4 and C6 with mild focal kyphosis. Retrolisthesis of C5 versus C4 is 2.5 mm which is similar to the retrolisthesis at C5-6. There is minimal anterolisthesis of T1  versus T2. No other malalignment. No fractures. Multilevel degenerative changes most marked at C5-6. Degenerative changes also seen with right greater than left C5-6 neural foraminal narrowing. No critical canal stenosis. IMPRESSION: 1. Subarachnoid hemorrhage in the sulci along the medial right frontal lobe and in the left side of the quadrigeminal plate cistern. 2. Complex fracture involving the lateral and inferior walls of the right orbit without evidence of entrapment of the inferior rectus muscle. There are fractures through the anterior, posterior superior, inferior and lateral walls of the right maxillary sinus. The maxillary fractures affect at least 1 of the 2 maxillary screws. Right zygomatic arch fractures are noted. 3. The malalignment in the cervical spine spine described above is favored to be degenerative with no identified fracture or soft tissue swelling. Findings called to Dr. Adela Lank Electronically Signed   By: Gerome Sam III M.D   On: 04/16/2016 16:43   Ct Maxillofacial Wo Cm  Result Date: 04/16/2016 CLINICAL DATA:  Pain after trauma EXAM: CT HEAD WITHOUT CONTRAST CT MAXILLOFACIAL WITHOUT CONTRAST CT CERVICAL SPINE WITHOUT CONTRAST TECHNIQUE: Multidetector CT imaging of the head, cervical spine, and maxillofacial structures were performed using the standard protocol without intravenous contrast. Multiplanar CT image reconstructions of the cervical spine and maxillofacial structures were  also generated. COMPARISON:  September 03, 2014 FINDINGS: CT HEAD FINDINGS There are multiple fractures in the right side of the face which will be described on the maxillofacial portion of the study. There is opacification of the right maxillary sinus. The remainder of the paranasal sinuses are normal. The mastoid air cells and middle ears are normal. There is soft tissue swelling in the right side of the face. The globes are intact. The remainder of the extracranial soft tissues are normal. There is high attenuation in the left side of the quadrigeminal plate cistern on series 3, image 11, consistent with subarachnoid hemorrhage. There is high attenuation in the sulci of the medial right frontal lobe such as on series 3, image 13 and image 15. These findings are new since January 2016. No subdural or epidural hemorrhage. Cerebellum and brainstem are normal. Basal cisterns are patent. Ventricles and sulci are normal for age and contain no blood. No mass effect or midline shift. No cortical ischemia or infarct. CT MAXILLOFACIAL FINDINGS There are complicated fractures through the right facial bones. The roof and medial walls of the right orbit are intact. There are fractures through the lateral wall which are mildly displaced. Displaced fractures are also seen through the inferior wall of the right orbit. Blood and debris obscure the inferior rectus muscle but it appears to remain within the orbit with no evidence of entrapment. Displaced fractures are seen through the lateral wall of the right maxillary sinus extending into the floor. Anterior, posterior, lateral, inferior, and superior wall maxillary sinus fractures identified. The patient has 4 maxillary screws. The fractures through the floor of the right maxillary sinus appear to disrupt the cement around at least 1 of these right sided screws. The medial wall of the right maxillary sinus is intact. The pterygoid plates remain intact. There is a depressed  fracture through the right zygomatic arch anteriorly and a nondisplaced fracture through the posterior right zygomatic arch. The left orbit is intact as is the left zygomatic arch. The mandible is also intact. CT CERVICAL SPINE FINDINGS C5 demonstrates retrolisthesis versus both C4 and C6 with mild focal kyphosis. Retrolisthesis of C5 versus C4 is 2.5 mm which is similar to the retrolisthesis at C5-6.  There is minimal anterolisthesis of T1 versus T2. No other malalignment. No fractures. Multilevel degenerative changes most marked at C5-6. Degenerative changes also seen with right greater than left C5-6 neural foraminal narrowing. No critical canal stenosis. IMPRESSION: 1. Subarachnoid hemorrhage in the sulci along the medial right frontal lobe and in the left side of the quadrigeminal plate cistern. 2. Complex fracture involving the lateral and inferior walls of the right orbit without evidence of entrapment of the inferior rectus muscle. There are fractures through the anterior, posterior superior, inferior and lateral walls of the right maxillary sinus. The maxillary fractures affect at least 1 of the 2 maxillary screws. Right zygomatic arch fractures are noted. 3. The malalignment in the cervical spine spine described above is favored to be degenerative with no identified fracture or soft tissue swelling. Findings called to Dr. Adela Lank Electronically Signed   By: Gerome Sam III M.D   On: 04/16/2016 16:43   Review of Systems  Constitutional: Negative for chills and fever.  HENT: Positive for facial swelling. Negative for ear pain and sore throat.   Eyes: Negative for pain and visual disturbance.  Respiratory: Negative for cough and shortness of breath.   Cardiovascular: Negative for chest pain and palpitations.  Gastrointestinal: Negative for abdominal pain and vomiting.  Genitourinary: Negative for dysuria and hematuria.  Musculoskeletal: Positive for arthralgias and back pain.  Skin: Negative for  color change and rash.  Neurological: Negative for seizures and syncope.  All other systems reviewed and are negative.  Blood pressure (!) 114/56, pulse 87, temperature 98.6 F (37 C), temperature source Oral, resp. rate (!) 21, height 5\' 2"  (1.575 m), weight 188 lb 15 oz (85.7 kg), SpO2 93 %. Physical Exam  Constitutional: She appears well-developed and well-nourished. No distress.  Head: Normocephalic. Scattered facial abrasions  Eyes: Conjunctivae are normal. EOMI. No significant entrapment. Pt reports diplopia at all gaze directions. No significant enophthalmus. ENT:  Examination of the ears shows normal auricles and external auditory canals bilaterally. Both tympanic membranes are intact. No middle ear effusion or hemotympanum is noted. Ecchymosis to right face, no significant nasal deformity, no nasal septal hematoma, Facial examination shows no asymmetry. Oral cavity examination shows no mucosal lacerations. Slight trismus is noted. Neck: Palpation of the neck reveals no lymphadenopathy or mass. The trachea is midline. The thyroid is not significantly enlarged. Cranial nerves 2-12 are all grossly in tact. Cardiovascular: Normal rate and regular rhythm.   Pulmonary/Chest: Effort normal and breath sounds normal. No respiratory distress.  Abdominal: Soft. There is no tenderness.  Musculoskeletal: She exhibits no edema.   Neurological: She is alert.  Skin: Skin is warm and dry.  Psychiatric: She has a normal mood and affect.  Nursing note and vitals reviewed.  Assessment/Plan: Right tripod fractures, s/p fall. Pt's extraocular motion is intact without significant entrapment or enophthalmus. Her diplopia may be a result of soft tissue edema vs loss of floor support. Her tripod fratures will likely need to be surgically repaired once the facial edema subsides. Will need ophtho eval. Will follow.  [employer],SUI W 04/17/2016, 3:42 PM

## 2016-04-17 NOTE — Care Management Note (Signed)
Case Management Note  Patient Details  Name: Jillian Morris MRN: [id number] Date of Birth: 10/21/1945  Subjective/Objective:  Pt admitted on 04/16/16 s/p fall with TBI/SAH, Rt wrist fx, and Rt orbit/maxillary sinus/zygomatic fx.  PTA, pt independent, lives with spouse.                    Action/Plan: PT/OT recommending IP rehab, and consult ordered.  If no CIR, pt will need HHPT/OT and ST follow up.  Case manager to follow up.    Expected Discharge Date:                  Expected Discharge Plan:  IP Rehab Facility  In-House Referral:     Discharge planning Services  CM Consult  Post Acute Care Choice:    Choice offered to:     DME Arranged:    DME Agency:     HH Arranged:    HH Agency:     Status of Service:  In process, will continue to follow  If discussed at Long Length of Stay Meetings, dates discussed:    Additional Comments:  Quintella BatonJulie W. Shell Blanchette, RN, BSN  Trauma/Neuro ICU Case Manager [phone]

## 2016-04-17 NOTE — Evaluation (Signed)
Physical Therapy Evaluation Patient Details Name: Jillian Morris MRN: [id number] DOB: 1945-09-03 Today's Date: 04/17/2016   History of Present Illness  Patient with a ground level fall in the hallway of her ophthalmologist office.  Brief LOC of minutes. Pt with R distal radius fx and oribital fx. CT on 8/25 + for stable small volume subarachnoid hemorrhage in the parafalcine right frontal lobe and left quadrigeminal plate cistern. PMHx: Anxiety, Arthritis, Chronic lower back pain, Depression, Fibromyalgia, GERD, Graves disease, IBS, Sjogren's syndrome, Stroke.   Clinical Impression  Patient demonstrates deficits in functional mobility as indicated below. Will need continued skilled PT ot address deficits and maximize function. Will see as indicated and progress as tolerated. At this time, concerned JX:BJYNWGN mobility, insight and awareness of deficits. Patient extremely high fall risk. Will need post acute rehabilitation. Recommend CIR consult.    Follow Up Recommendations CIR    Equipment Recommendations  Other (comment) (tbd)    Recommendations for Other Services       Precautions / Restrictions Precautions Precautions: Fall Restrictions Weight Bearing Restrictions: Yes RUE Weight Bearing:  (No orders but kepts pt NWB throughout session) Other Position/Activity Restrictions: RUE sling      Mobility  Bed Mobility Overal bed mobility: Needs Assistance Bed Mobility: Supine to Sit     Supine to sit: Min guard     General bed mobility comments: min guard for safety  Transfers Overall transfer level: Needs assistance Equipment used: 1 person hand held assist Transfers: Sit to/from Stand Sit to Stand: Min assist         General transfer comment: min assist for balance and stability when elevating to upright  Ambulation/Gait Ambulation/Gait assistance: Mod assist Ambulation Distance (Feet): 90 Feet Assistive device: 1 person hand held assist Gait Pattern/deviations:  Step-through pattern;Decreased stride length;Shuffle;Narrow base of support Gait velocity: decreased Gait velocity interpretation: Below normal speed for age/gender General Gait Details: patient very unsteady with ambulation, also required assist for safety due to visual deficits and balance deficits related to LE weakness and limited RUE altering overall gait  Stairs            Wheelchair Mobility    Modified Rankin (Stroke Patients Only) Modified Rankin (Stroke Patients Only) Pre-Morbid Rankin Score: No symptoms Modified Rankin: Moderately severe disability     Balance Overall balance assessment: History of Falls;Needs assistance Sitting-balance support: Feet supported;No upper extremity supported Sitting balance-Leahy Scale: Good     Standing balance support: Single extremity supported Standing balance-Leahy Scale: Poor Standing balance comment: Requires single UE support for balance in standing.                             Pertinent Vitals/Pain Pain Assessment: 0-10 Pain Score: 3  Pain Location: R UE Pain Descriptors / Indicators: Aching;Sore Pain Intervention(s): Monitored during session;Premedicated before session;Repositioned    Home Living Family/patient expects to be discharged to:: Private residence Living Arrangements: Spouse/significant other Available Help at Discharge: Family Type of Home: House Home Access: Level entry     Home Layout: One level Home Equipment: Cane - single point      Prior Function Level of Independence: Independent with assistive device(s)         Comments: SPC for mobility      Hand Dominance   Dominant Hand: Right    Extremity/Trunk Assessment   Upper Extremity Assessment: RUE deficits/detail RUE Deficits / Details: Increased edema noted in digits. Pt reports sensation intact.  Shoulder AROM WFL.  RUE: Unable to fully assess due to immobilization       Lower Extremity Assessment: Defer to PT  evaluation      Cervical / Trunk Assessment: Kyphotic  Communication   Communication: No difficulties  Cognition Arousal/Alertness: Awake/alert Behavior During Therapy: Flat affect;Impulsive Overall Cognitive Status: Impaired/Different from baseline Area of Impairment: Attention;Memory;Following commands;Safety/judgement;Awareness;Problem solving   Current Attention Level: Selective Memory: Decreased short-term memory Following Commands: Follows one step commands with increased time Safety/Judgement: Decreased awareness of safety;Decreased awareness of deficits Awareness: Emergent Problem Solving: Slow processing;Decreased initiation;Difficulty sequencing;Requires verbal cues;Requires tactile cues General Comments: patient tangential throughout session, also acknowledges some various degress of baseline confusion over the past several weeks. Patient indicated that she started using a cane because she 'got lost driving to her hair appointment' and does not comprehend while this correlation does not appear fitting    General Comments      Exercises        Assessment/Plan    PT Assessment Patient needs continued PT services  PT Diagnosis Difficulty walking;Abnormality of gait;Generalized weakness;Acute pain;Altered mental status   PT Problem List Decreased strength;Decreased range of motion;Decreased activity tolerance;Decreased balance;Decreased mobility;Decreased coordination;Decreased cognition;Decreased safety awareness  PT Treatment Interventions DME instruction;Gait training;Functional mobility training;Therapeutic activities;Therapeutic exercise;Balance training;Patient/family education   PT Goals (Current goals can be found in the Care Plan section) Acute Rehab PT Goals Patient Stated Goal: get better    Frequency Min 3X/week   Barriers to discharge        Co-evaluation PT/OT/SLP Co-Evaluation/Treatment: Yes Reason for Co-Treatment: Complexity of the patient's  impairments (multi-system involvement) PT goals addressed during session: Mobility/safety with mobility OT goals addressed during session: ADL's and self-care       End of Session Equipment Utilized During Treatment: Gait belt Activity Tolerance: Patient tolerated treatment well Patient left: in chair;with call bell/phone within reach;with family/visitor present Nurse Communication: Mobility status         Time: 1110-1134 PT Time Calculation (min) (ACUTE ONLY): 24 min   Charges:   PT Evaluation $PT Eval Moderate Complexity: 1 Procedure     PT G CodesFabio Asa:        Christabella Alvira J 04/17/2016, 11:37 AM  Charlotte Crumbevon Paiton B[place]tinghouse, PT DPT  [phone]

## 2016-04-17 NOTE — Progress Notes (Signed)
Subjective: Patient reports double vision  Objective: Vital signs in last 24 hours: Temp:  [98.8 F (37.1 C)-99.1 F (37.3 C)] 98.8 F (37.1 C) (08/25 0400) Pulse Rate:  [84-100] 84 (08/25 0600) Resp:  [11-21] 11 (08/25 0600) BP: (103-163)/(66-100) 152/73 (08/25 0600) SpO2:  [92 %-99 %] 96 % (08/25 0600) Weight:  [85.7 kg (188 lb 15 oz)] 85.7 kg (188 lb 15 oz) (08/24 2300)  Intake/Output from previous day: 08/24 0701 - 08/25 0700 In: 1323.3 [I.V.:1323.3] Out: -  Intake/Output this shift: No intake/output data recorded.  Physical Exam: Poor upgaze, diplopia.  Pupils reactive.  MAEW.  Lab Results:  Recent Labs  04/16/16 1536 04/17/16 0359  WBC 4.4 5.0  HGB 11.9* 10.9*  HCT 37.3 33.8*  PLT 161 172   BMET  Recent Labs  04/16/16 1536 04/17/16 0359  NA 139 138  K 4.2 4.3  CL 103 101  CO2 27 27  GLUCOSE 112* 103*  BUN 11 11  CREATININE 1.28* 1.02*  CALCIUM 9.3 8.9    Studies/Results: Dg Chest 1 View  Result Date: 04/16/2016 CLINICAL DATA:  Pt c/o bilateral upper back pain, generalized right wrist pain, swelling, and bruising, generalized right elbow pain, and bilateral hip pain after experiencing an unwitnessed fall this afternoon. No known hx of prior injuries or surgeries. No hx of heart or lung problems. Pt is a nonsmoker. EXAM: CHEST 1 VIEW COMPARISON:  12/29/2010 FINDINGS: The cardiac silhouette is normal in size and configuration. No mediastinal or hilar masses or evidence of adenopathy. Lungs are clear.  No pleural effusion or pneumothorax. Bony thorax is intact. IMPRESSION: No active disease. Electronically Signed   By: Amie Portland M.D.   On: 04/16/2016 17:02   Dg Thoracic Spine 2 View  Result Date: 04/16/2016 CLINICAL DATA:  Pt c/o bilateral upper back pain, generalized right wrist pain, swelling, and bruising, generalized right elbow pain, and bilateral hip pain after experiencing an unwitnessed fall this afternoon. No known hx of prior injuries or  surgeries. No hx of heart or lung problems. Pt is a nonsmoker. EXAM: THORACIC SPINE 2 VIEWS COMPARISON:  Chest CT, 02/18/2011 FINDINGS: No fracture.  No spondylolisthesis.  No bone lesion. Bones are demineralized. There are mild disc degenerative changes centered in the mid thoracic spine. Soft tissues are unremarkable. IMPRESSION: 1. No fracture, spondylolisthesis or acute finding. Electronically Signed   By: Amie Portland M.D.   On: 04/16/2016 17:03   Dg Pelvis 1-2 Views  Result Date: 04/16/2016 CLINICAL DATA:  Bilateral upper back pain.  Status post fall. EXAM: PELVIS - 1-2 VIEW COMPARISON:  None. FINDINGS: Generalized osteopenia. No acute fracture or subluxation. 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 IMPRESSION: No acute osseous injury of the pelvis. Electronically Signed   By: Elige Ko   On: 04/16/2016 16:59   Dg Elbow Complete Right  Result Date: 04/16/2016 CLINICAL DATA:  Pt c/o bilateral upper back pain, generalized right wrist pain, swelling, and bruising, generalized right elbow pain, and bilateral hip pain after experiencing an unwitnessed fall this afternoon. No known hx of prior injuries or surgeries. No hx of heart or lung problems. Pt is a nonsmoker. EXAM: RIGHT ELBOW - COMPLETE 3+ VIEW COMPARISON:  None. FINDINGS: No fracture or dislocation. Elbow joint normally spaced and aligned.  No joint effusion. Soft tissues are unremarkable. IMPRESSION: No fracture or dislocation. Electronically Signed   By: Amie Portland M.D.   On: 04/16/2016 16:58   Dg Wrist  Complete Right  Result Date: 04/16/2016 CLINICAL DATA:  Pt c/o bilateral upper back pain, generalized right wrist pain, swelling, and bruising, generalized right elbow pain, and bilateral hip pain after experiencing an unwitnessed fall this afternoon. No known hx of prior injuries or surgeries. No hx of heart or lung problems. Pt is a nonsmoker. EXAM: RIGHT WRIST - COMPLETE 3+ VIEW  COMPARISON:  None. FINDINGS: There is a comminuted fracture of the distal radius with an associated nondisplaced ulnar styloid fracture. The primary fracture is transverse across the metaphysis. There is a separate fracture component of the radial styloid and other fracture components intersecting the lunate facet than the dorsal aspect of the distal radial articular surface. Fracture is dorsally impacted leading to dorsal angulation of the distal radial articular surface of approximately 17 degrees. Bones are demineralized. There is no dislocation. There is diffuse surrounding soft tissue edema. IMPRESSION: 1. Comminuted, dorsally impacted fracture of the distal radial metaphysis with dorsal angulation of the distal radial articular surface. There is associated ulnar styloid fracture. No dislocation. Electronically Signed   By: Amie Portland M.D.   On: 04/16/2016 17:05   Ct Head Without Contrast  Result Date: 04/17/2016 CLINICAL DATA:  Follow-up subarachnoid hemorrhage. EXAM: CT HEAD WITHOUT CONTRAST TECHNIQUE: Contiguous axial images were obtained from the base of the skull through the vertex without intravenous contrast. COMPARISON:  Yesterday at 1606 hours FINDINGS: Brain: Parafalcine subarachnoid hemorrhage in the right frontal lobe is unchanged, small in volume. Subarachnoid hemorrhage in the lower left quadrigeminal plate is unchanged, small in volume. No new hemorrhage. No developing hydrocephalus. No acute ischemia. Stable brain volume. Vascular: No hyperdense vessel or unexpected calcification. Skull: No calvarial fracture. Probable sebaceous cyst in the midline and left frontal scalp. Sinuses/Orbits: Multiple facial fractures on the right, assessed on recent face CT. IMPRESSION: Stable small volume subarachnoid hemorrhage in the parafalcine right frontal lobe and left quadrigeminal plate cistern. No new hemorrhage or acute abnormality.  No hydrocephalus. Electronically Signed   By: Rubye Oaks  M.D.   On: 04/17/2016 02:49   Ct Head Wo Contrast  Result Date: 04/16/2016 CLINICAL DATA:  Pain after trauma EXAM: CT HEAD WITHOUT CONTRAST CT MAXILLOFACIAL WITHOUT CONTRAST CT CERVICAL SPINE WITHOUT CONTRAST TECHNIQUE: Multidetector CT imaging of the head, cervical spine, and maxillofacial structures were performed using the standard protocol without intravenous contrast. Multiplanar CT image reconstructions of the cervical spine and maxillofacial structures were also generated. COMPARISON:  September 03, 2014 FINDINGS: CT HEAD FINDINGS There are multiple fractures in the right side of the face which will be described on the maxillofacial portion of the study. There is opacification of the right maxillary sinus. The remainder of the paranasal sinuses are normal. The mastoid air cells and middle ears are normal. There is soft tissue swelling in the right side of the face. The globes are intact. The remainder of the extracranial soft tissues are normal. There is high attenuation in the left side of the quadrigeminal plate cistern on series 3, image 11, consistent with subarachnoid hemorrhage. There is high attenuation in the sulci of the medial right frontal lobe such as on series 3, image 13 and image 15. These findings are new since January 2016. No subdural or epidural hemorrhage. Cerebellum and brainstem are normal. Basal cisterns are patent. Ventricles and sulci are normal for age and contain no blood. No mass effect or midline shift. No cortical ischemia or infarct. CT MAXILLOFACIAL FINDINGS There are complicated fractures through the right facial bones. The  roof and medial walls of the right orbit are intact. There are fractures through the lateral wall which are mildly displaced. Displaced fractures are also seen through the inferior wall of the right orbit. Blood and debris obscure the inferior rectus muscle but it appears to remain within the orbit with no evidence of entrapment. Displaced fractures are  seen through the lateral wall of the right maxillary sinus extending into the floor. Anterior, posterior, lateral, inferior, and superior wall maxillary sinus fractures identified. The patient has 4 maxillary screws. The fractures through the floor of the right maxillary sinus appear to disrupt the cement around at least 1 of these right sided screws. The medial wall of the right maxillary sinus is intact. The pterygoid plates remain intact. There is a depressed fracture through the right zygomatic arch anteriorly and a nondisplaced fracture through the posterior right zygomatic arch. The left orbit is intact as is the left zygomatic arch. The mandible is also intact. CT CERVICAL SPINE FINDINGS C5 demonstrates retrolisthesis versus both C4 and C6 with mild focal kyphosis. Retrolisthesis of C5 versus C4 is 2.5 mm which is similar to the retrolisthesis at C5-6. There is minimal anterolisthesis of T1 versus T2. No other malalignment. No fractures. Multilevel degenerative changes most marked at C5-6. Degenerative changes also seen with right greater than left C5-6 neural foraminal narrowing. No critical canal stenosis. IMPRESSION: 1. Subarachnoid hemorrhage in the sulci along the medial right frontal lobe and in the left side of the quadrigeminal plate cistern. 2. Complex fracture involving the lateral and inferior walls of the right orbit without evidence of entrapment of the inferior rectus muscle. There are fractures through the anterior, posterior superior, inferior and lateral walls of the right maxillary sinus. The maxillary fractures affect at least 1 of the 2 maxillary screws. Right zygomatic arch fractures are noted. 3. The malalignment in the cervical spine spine described above is favored to be degenerative with no identified fracture or soft tissue swelling. Findings called to Dr. Adela Lank Electronically Signed   By: Gerome Sam III M.D   On: 04/16/2016 16:43   Ct Cervical Spine Wo Contrast  Result  Date: 04/16/2016 CLINICAL DATA:  Pain after trauma EXAM: CT HEAD WITHOUT CONTRAST CT MAXILLOFACIAL WITHOUT CONTRAST CT CERVICAL SPINE WITHOUT CONTRAST TECHNIQUE: Multidetector CT imaging of the head, cervical spine, and maxillofacial structures were performed using the standard protocol without intravenous contrast. Multiplanar CT image reconstructions of the cervical spine and maxillofacial structures were also generated. COMPARISON:  September 03, 2014 FINDINGS: CT HEAD FINDINGS There are multiple fractures in the right side of the face which will be described on the maxillofacial portion of the study. There is opacification of the right maxillary sinus. The remainder of the paranasal sinuses are normal. The mastoid air cells and middle ears are normal. There is soft tissue swelling in the right side of the face. The globes are intact. The remainder of the extracranial soft tissues are normal. There is high attenuation in the left side of the quadrigeminal plate cistern on series 3, image 11, consistent with subarachnoid hemorrhage. There is high attenuation in the sulci of the medial right frontal lobe such as on series 3, image 13 and image 15. These findings are new since January 2016. No subdural or epidural hemorrhage. Cerebellum and brainstem are normal. Basal cisterns are patent. Ventricles and sulci are normal for age and contain no blood. No mass effect or midline shift. No cortical ischemia or infarct. CT MAXILLOFACIAL FINDINGS There are complicated  fractures through the right facial bones. The roof and medial walls of the right orbit are intact. There are fractures through the lateral wall which are mildly displaced. Displaced fractures are also seen through the inferior wall of the right orbit. Blood and debris obscure the inferior rectus muscle but it appears to remain within the orbit with no evidence of entrapment. Displaced fractures are seen through the lateral wall of the right maxillary sinus  extending into the floor. Anterior, posterior, lateral, inferior, and superior wall maxillary sinus fractures identified. The patient has 4 maxillary screws. The fractures through the floor of the right maxillary sinus appear to disrupt the cement around at least 1 of these right sided screws. The medial wall of the right maxillary sinus is intact. The pterygoid plates remain intact. There is a depressed fracture through the right zygomatic arch anteriorly and a nondisplaced fracture through the posterior right zygomatic arch. The left orbit is intact as is the left zygomatic arch. The mandible is also intact. CT CERVICAL SPINE FINDINGS C5 demonstrates retrolisthesis versus both C4 and C6 with mild focal kyphosis. Retrolisthesis of C5 versus C4 is 2.5 mm which is similar to the retrolisthesis at C5-6. There is minimal anterolisthesis of T1 versus T2. No other malalignment. No fractures. Multilevel degenerative changes most marked at C5-6. Degenerative changes also seen with right greater than left C5-6 neural foraminal narrowing. No critical canal stenosis. IMPRESSION: 1. Subarachnoid hemorrhage in the sulci along the medial right frontal lobe and in the left side of the quadrigeminal plate cistern. 2. Complex fracture involving the lateral and inferior walls of the right orbit without evidence of entrapment of the inferior rectus muscle. There are fractures through the anterior, posterior superior, inferior and lateral walls of the right maxillary sinus. The maxillary fractures affect at least 1 of the 2 maxillary screws. Right zygomatic arch fractures are noted. 3. The malalignment in the cervical spine spine described above is favored to be degenerative with no identified fracture or soft tissue swelling. Findings called to Dr. Adela Lank Electronically Signed   By: Gerome Sam III M.D   On: 04/16/2016 16:43   Ct Maxillofacial Wo Cm  Result Date: 04/16/2016 CLINICAL DATA:  Pain after trauma EXAM: CT HEAD  WITHOUT CONTRAST CT MAXILLOFACIAL WITHOUT CONTRAST CT CERVICAL SPINE WITHOUT CONTRAST TECHNIQUE: Multidetector CT imaging of the head, cervical spine, and maxillofacial structures were performed using the standard protocol without intravenous contrast. Multiplanar CT image reconstructions of the cervical spine and maxillofacial structures were also generated. COMPARISON:  September 03, 2014 FINDINGS: CT HEAD FINDINGS There are multiple fractures in the right side of the face which will be described on the maxillofacial portion of the study. There is opacification of the right maxillary sinus. The remainder of the paranasal sinuses are normal. The mastoid air cells and middle ears are normal. There is soft tissue swelling in the right side of the face. The globes are intact. The remainder of the extracranial soft tissues are normal. There is high attenuation in the left side of the quadrigeminal plate cistern on series 3, image 11, consistent with subarachnoid hemorrhage. There is high attenuation in the sulci of the medial right frontal lobe such as on series 3, image 13 and image 15. These findings are new since January 2016. No subdural or epidural hemorrhage. Cerebellum and brainstem are normal. Basal cisterns are patent. Ventricles and sulci are normal for age and contain no blood. No mass effect or midline shift. No cortical ischemia or infarct.  CT MAXILLOFACIAL FINDINGS There are complicated fractures through the right facial bones. The roof and medial walls of the right orbit are intact. There are fractures through the lateral wall which are mildly displaced. Displaced fractures are also seen through the inferior wall of the right orbit. Blood and debris obscure the inferior rectus muscle but it appears to remain within the orbit with no evidence of entrapment. Displaced fractures are seen through the lateral wall of the right maxillary sinus extending into the floor. Anterior, posterior, lateral, inferior, and  superior wall maxillary sinus fractures identified. The patient has 4 maxillary screws. The fractures through the floor of the right maxillary sinus appear to disrupt the cement around at least 1 of these right sided screws. The medial wall of the right maxillary sinus is intact. The pterygoid plates remain intact. There is a depressed fracture through the right zygomatic arch anteriorly and a nondisplaced fracture through the posterior right zygomatic arch. The left orbit is intact as is the left zygomatic arch. The mandible is also intact. CT CERVICAL SPINE FINDINGS C5 demonstrates retrolisthesis versus both C4 and C6 with mild focal kyphosis. Retrolisthesis of C5 versus C4 is 2.5 mm which is similar to the retrolisthesis at C5-6. There is minimal anterolisthesis of T1 versus T2. No other malalignment. No fractures. Multilevel degenerative changes most marked at C5-6. Degenerative changes also seen with right greater than left C5-6 neural foraminal narrowing. No critical canal stenosis. IMPRESSION: 1. Subarachnoid hemorrhage in the sulci along the medial right frontal lobe and in the left side of the quadrigeminal plate cistern. 2. Complex fracture involving the lateral and inferior walls of the right orbit without evidence of entrapment of the inferior rectus muscle. There are fractures through the anterior, posterior superior, inferior and lateral walls of the right maxillary sinus. The maxillary fractures affect at least 1 of the 2 maxillary screws. Right zygomatic arch fractures are noted. 3. The malalignment in the cervical spine spine described above is favored to be degenerative with no identified fracture or soft tissue swelling. Findings called to Dr. Adela LankFloyd Electronically Signed   By: Gerome Samavid  Williams III M.D   On: 04/16/2016 16:43    Assessment/Plan: Patient has diplopia likely with inferior rectus entrapment.  Repeat Head CT today.  Needs Ophthy eval and maxillofacial.    LOS: 0 days     Dorian HeckleSTERN,Karinna Beadles D, MD 04/17/2016, 7:43 AM

## 2016-04-18 LAB — BASIC METABOLIC PANEL
Anion gap: 6 (ref 5–15)
BUN: 8 mg/dL (ref 6–20)
CO2: 26 mmol/L (ref 22–32)
Calcium: 8.9 mg/dL (ref 8.9–10.3)
Chloride: 107 mmol/L (ref 101–111)
Creatinine, Ser: 1.07 mg/dL — ABNORMAL HIGH (ref 0.44–1.00)
GFR calc Af Amer: 59 mL/min — ABNORMAL LOW (ref >60)
GFR calc non Af Amer: 51 mL/min — ABNORMAL LOW (ref >60)
Glucose, Bld: 99 mg/dL (ref 65–99)
Potassium: 3.8 mmol/L (ref 3.5–5.1)
Sodium: 139 mmol/L (ref 135–145)

## 2016-04-18 LAB — CBC
HCT: 33.8 % — ABNORMAL LOW (ref 36.0–46.0)
Hemoglobin: 10.7 g/dL — ABNORMAL LOW (ref 12.0–15.0)
MCH: 29.6 pg (ref 26.0–34.0)
MCHC: 31.7 g/dL (ref 30.0–36.0)
MCV: 93.6 fL (ref 78.0–100.0)
Platelets: 156 10*3/uL (ref 150–400)
RBC: 3.61 MIL/uL — ABNORMAL LOW (ref 3.87–5.11)
RDW: 12.7 % (ref 11.5–15.5)
WBC: 4.5 10*3/uL (ref 4.0–10.5)

## 2016-04-18 MED ORDER — BISACODYL 5 MG PO TBEC: 5.0000 mg | DELAYED_RELEASE_TABLET | Freq: Every day | ORAL | Status: DC | PRN

## 2016-04-18 NOTE — Progress Notes (Signed)
Patient ID: Jillian Morris, female   DOB: 11/17/1945, 70 y.o.   MRN: [id number] Subjective: Patient reports diplopia (horizontal) is improving, some headache, no NV, eating well, no NTW  Objective: Vital signs in last 24 hours: Temp:  [98 F (36.7 C)-98.7 F (37.1 C)] 98.3 F (36.8 C) (08/26 0800) Pulse Rate:  [68-87] 70 (08/26 0900) Resp:  [8-22] 13 (08/26 0900) BP: (84-146)/(56-126) 135/69 (08/26 0900) SpO2:  [90 %-96 %] 95 % (08/26 0900)  Intake/Output from previous day: 08/25 0701 - 08/26 0700 In: 1200 [I.V.:1200] Out: 500 [Urine:500] Intake/Output this shift: Total I/O In: 390 [P.O.:240; I.V.:150] Out: -   Neurologic: Grossly normal, GCS 15, awake alert and conversant, no focal motor deficit  Lab Results: Lab Results  Component Value Date   WBC 4.5 04/18/2016   HGB 10.7 (L) 04/18/2016   HCT 33.8 (L) 04/18/2016   MCV 93.6 04/18/2016   PLT 156 04/18/2016   Lab Results  Component Value Date   INR 0.89 02/04/2011   BMET Lab Results  Component Value Date   NA 139 04/18/2016   K 3.8 04/18/2016   CL 107 04/18/2016   CO2 26 04/18/2016   GLUCOSE 99 04/18/2016   BUN 8 04/18/2016   CREATININE 1.07 (H) 04/18/2016   CALCIUM 8.9 04/18/2016    Studies/Results: Dg Chest 1 View  Result Date: 04/16/2016 CLINICAL DATA:  Pt c/o bilateral upper back pain, generalized right wrist pain, swelling, and bruising, generalized right elbow pain, and bilateral hip pain after experiencing an unwitnessed fall this afternoon. No known hx of prior injuries or surgeries. No hx of heart or lung problems. Pt is a nonsmoker. EXAM: CHEST 1 VIEW COMPARISON:  12/29/2010 FINDINGS: The cardiac silhouette is normal in size and configuration. No mediastinal or hilar masses or evidence of adenopathy. Lungs are clear.  No pleural effusion or pneumothorax. Bony thorax is intact. IMPRESSION: No active disease. Electronically Signed   By: Amie Portlandavid  Ormond M.D.   On: 04/16/2016 17:02   Dg Thoracic Spine 2  View  Result Date: 04/16/2016 CLINICAL DATA:  Pt c/o bilateral upper back pain, generalized right wrist pain, swelling, and bruising, generalized right elbow pain, and bilateral hip pain after experiencing an unwitnessed fall this afternoon. No known hx of prior injuries or surgeries. No hx of heart or lung problems. Pt is a nonsmoker. EXAM: THORACIC SPINE 2 VIEWS COMPARISON:  Chest CT, 02/18/2011 FINDINGS: No fracture.  No spondylolisthesis.  No bone lesion. Bones are demineralized. There are mild disc degenerative changes centered in the mid thoracic spine. Soft tissues are unremarkable. IMPRESSION: 1. No fracture, spondylolisthesis or acute finding. Electronically Signed   By: Amie Portlandavid  Ormond M.D.   On: 04/16/2016 17:03   Dg Pelvis 1-2 Views  Result Date: 04/16/2016 CLINICAL DATA:  Bilateral upper back pain.  Status post fall. EXAM: PELVIS - 1-2 VIEW COMPARISON:  None. FINDINGS: Generalized osteopenia. No acute fracture or subluxation. 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 IMPRESSION: No acute osseous injury of the pelvis. Electronically Signed   By: Elige KoHetal  Patel   On: 04/16/2016 16:59   Dg Elbow Complete Right  Result Date: 04/16/2016 CLINICAL DATA:  Pt c/o bilateral upper back pain, generalized right wrist pain, swelling, and bruising, generalized right elbow pain, and bilateral hip pain after experiencing an unwitnessed fall this afternoon. No known hx of prior injuries or surgeries. No hx of heart or lung problems. Pt is a nonsmoker. EXAM: RIGHT ELBOW - COMPLETE  3+ VIEW COMPARISON:  None. FINDINGS: No fracture or dislocation. Elbow joint normally spaced and aligned.  No joint effusion. Soft tissues are unremarkable. IMPRESSION: No fracture or dislocation. Electronically Signed   By: Amie Portland M.D.   On: 04/16/2016 16:58   Dg Wrist Complete Right  Result Date: 04/16/2016 CLINICAL DATA:  Pt c/o bilateral upper back pain, generalized right  wrist pain, swelling, and bruising, generalized right elbow pain, and bilateral hip pain after experiencing an unwitnessed fall this afternoon. No known hx of prior injuries or surgeries. No hx of heart or lung problems. Pt is a nonsmoker. EXAM: RIGHT WRIST - COMPLETE 3+ VIEW COMPARISON:  None. FINDINGS: There is a comminuted fracture of the distal radius with an associated nondisplaced ulnar styloid fracture. The primary fracture is transverse across the metaphysis. There is a separate fracture component of the radial styloid and other fracture components intersecting the lunate facet than the dorsal aspect of the distal radial articular surface. Fracture is dorsally impacted leading to dorsal angulation of the distal radial articular surface of approximately 17 degrees. Bones are demineralized. There is no dislocation. There is diffuse surrounding soft tissue edema. IMPRESSION: 1. Comminuted, dorsally impacted fracture of the distal radial metaphysis with dorsal angulation of the distal radial articular surface. There is associated ulnar styloid fracture. No dislocation. Electronically Signed   By: Amie Portland M.D.   On: 04/16/2016 17:05   Ct Head Without Contrast  Result Date: 04/17/2016 CLINICAL DATA:  Follow-up subarachnoid hemorrhage. EXAM: CT HEAD WITHOUT CONTRAST TECHNIQUE: Contiguous axial images were obtained from the base of the skull through the vertex without intravenous contrast. COMPARISON:  Yesterday at 1606 hours FINDINGS: Brain: Parafalcine subarachnoid hemorrhage in the right frontal lobe is unchanged, small in volume. Subarachnoid hemorrhage in the lower left quadrigeminal plate is unchanged, small in volume. No new hemorrhage. No developing hydrocephalus. No acute ischemia. Stable brain volume. Vascular: No hyperdense vessel or unexpected calcification. Skull: No calvarial fracture. Probable sebaceous cyst in the midline and left frontal scalp. Sinuses/Orbits: Multiple facial fractures on  the right, assessed on recent face CT. IMPRESSION: Stable small volume subarachnoid hemorrhage in the parafalcine right frontal lobe and left quadrigeminal plate cistern. No new hemorrhage or acute abnormality.  No hydrocephalus. Electronically Signed   By: Rubye Oaks M.D.   On: 04/17/2016 02:49   Ct Head Wo Contrast  Result Date: 04/16/2016 CLINICAL DATA:  Pain after trauma EXAM: CT HEAD WITHOUT CONTRAST CT MAXILLOFACIAL WITHOUT CONTRAST CT CERVICAL SPINE WITHOUT CONTRAST TECHNIQUE: Multidetector CT imaging of the head, cervical spine, and maxillofacial structures were performed using the standard protocol without intravenous contrast. Multiplanar CT image reconstructions of the cervical spine and maxillofacial structures were also generated. COMPARISON:  September 03, 2014 FINDINGS: CT HEAD FINDINGS There are multiple fractures in the right side of the face which will be described on the maxillofacial portion of the study. There is opacification of the right maxillary sinus. The remainder of the paranasal sinuses are normal. The mastoid air cells and middle ears are normal. There is soft tissue swelling in the right side of the face. The globes are intact. The remainder of the extracranial soft tissues are normal. There is high attenuation in the left side of the quadrigeminal plate cistern on series 3, image 11, consistent with subarachnoid hemorrhage. There is high attenuation in the sulci of the medial right frontal lobe such as on series 3, image 13 and image 15. These findings are new since January 2016. No subdural  or epidural hemorrhage. Cerebellum and brainstem are normal. Basal cisterns are patent. Ventricles and sulci are normal for age and contain no blood. No mass effect or midline shift. No cortical ischemia or infarct. CT MAXILLOFACIAL FINDINGS There are complicated fractures through the right facial bones. The roof and medial walls of the right orbit are intact. There are fractures through  the lateral wall which are mildly displaced. Displaced fractures are also seen through the inferior wall of the right orbit. Blood and debris obscure the inferior rectus muscle but it appears to remain within the orbit with no evidence of entrapment. Displaced fractures are seen through the lateral wall of the right maxillary sinus extending into the floor. Anterior, posterior, lateral, inferior, and superior wall maxillary sinus fractures identified. The patient has 4 maxillary screws. The fractures through the floor of the right maxillary sinus appear to disrupt the cement around at least 1 of these right sided screws. The medial wall of the right maxillary sinus is intact. The pterygoid plates remain intact. There is a depressed fracture through the right zygomatic arch anteriorly and a nondisplaced fracture through the posterior right zygomatic arch. The left orbit is intact as is the left zygomatic arch. The mandible is also intact. CT CERVICAL SPINE FINDINGS C5 demonstrates retrolisthesis versus both C4 and C6 with mild focal kyphosis. Retrolisthesis of C5 versus C4 is 2.5 mm which is similar to the retrolisthesis at C5-6. There is minimal anterolisthesis of T1 versus T2. No other malalignment. No fractures. Multilevel degenerative changes most marked at C5-6. Degenerative changes also seen with right greater than left C5-6 neural foraminal narrowing. No critical canal stenosis. IMPRESSION: 1. Subarachnoid hemorrhage in the sulci along the medial right frontal lobe and in the left side of the quadrigeminal plate cistern. 2. Complex fracture involving the lateral and inferior walls of the right orbit without evidence of entrapment of the inferior rectus muscle. There are fractures through the anterior, posterior superior, inferior and lateral walls of the right maxillary sinus. The maxillary fractures affect at least 1 of the 2 maxillary screws. Right zygomatic arch fractures are noted. 3. The malalignment in  the cervical spine spine described above is favored to be degenerative with no identified fracture or soft tissue swelling. Findings called to Dr. Adela Lank Electronically Signed   By: Gerome Sam III M.D   On: 04/16/2016 16:43   Ct Cervical Spine Wo Contrast  Result Date: 04/16/2016 CLINICAL DATA:  Pain after trauma EXAM: CT HEAD WITHOUT CONTRAST CT MAXILLOFACIAL WITHOUT CONTRAST CT CERVICAL SPINE WITHOUT CONTRAST TECHNIQUE: Multidetector CT imaging of the head, cervical spine, and maxillofacial structures were performed using the standard protocol without intravenous contrast. Multiplanar CT image reconstructions of the cervical spine and maxillofacial structures were also generated. COMPARISON:  September 03, 2014 FINDINGS: CT HEAD FINDINGS There are multiple fractures in the right side of the face which will be described on the maxillofacial portion of the study. There is opacification of the right maxillary sinus. The remainder of the paranasal sinuses are normal. The mastoid air cells and middle ears are normal. There is soft tissue swelling in the right side of the face. The globes are intact. The remainder of the extracranial soft tissues are normal. There is high attenuation in the left side of the quadrigeminal plate cistern on series 3, image 11, consistent with subarachnoid hemorrhage. There is high attenuation in the sulci of the medial right frontal lobe such as on series 3, image 13 and image 15. These findings  are new since January 2016. No subdural or epidural hemorrhage. Cerebellum and brainstem are normal. Basal cisterns are patent. Ventricles and sulci are normal for age and contain no blood. No mass effect or midline shift. No cortical ischemia or infarct. CT MAXILLOFACIAL FINDINGS There are complicated fractures through the right facial bones. The roof and medial walls of the right orbit are intact. There are fractures through the lateral wall which are mildly displaced. Displaced fractures  are also seen through the inferior wall of the right orbit. Blood and debris obscure the inferior rectus muscle but it appears to remain within the orbit with no evidence of entrapment. Displaced fractures are seen through the lateral wall of the right maxillary sinus extending into the floor. Anterior, posterior, lateral, inferior, and superior wall maxillary sinus fractures identified. The patient has 4 maxillary screws. The fractures through the floor of the right maxillary sinus appear to disrupt the cement around at least 1 of these right sided screws. The medial wall of the right maxillary sinus is intact. The pterygoid plates remain intact. There is a depressed fracture through the right zygomatic arch anteriorly and a nondisplaced fracture through the posterior right zygomatic arch. The left orbit is intact as is the left zygomatic arch. The mandible is also intact. CT CERVICAL SPINE FINDINGS C5 demonstrates retrolisthesis versus both C4 and C6 with mild focal kyphosis. Retrolisthesis of C5 versus C4 is 2.5 mm which is similar to the retrolisthesis at C5-6. There is minimal anterolisthesis of T1 versus T2. No other malalignment. No fractures. Multilevel degenerative changes most marked at C5-6. Degenerative changes also seen with right greater than left C5-6 neural foraminal narrowing. No critical canal stenosis. IMPRESSION: 1. Subarachnoid hemorrhage in the sulci along the medial right frontal lobe and in the left side of the quadrigeminal plate cistern. 2. Complex fracture involving the lateral and inferior walls of the right orbit without evidence of entrapment of the inferior rectus muscle. There are fractures through the anterior, posterior superior, inferior and lateral walls of the right maxillary sinus. The maxillary fractures affect at least 1 of the 2 maxillary screws. Right zygomatic arch fractures are noted. 3. The malalignment in the cervical spine spine described above is favored to be  degenerative with no identified fracture or soft tissue swelling. Findings called to Dr. Adela Lank Electronically Signed   By: Gerome Sam III M.D   On: 04/16/2016 16:43   Ct Maxillofacial Wo Cm  Result Date: 04/16/2016 CLINICAL DATA:  Pain after trauma EXAM: CT HEAD WITHOUT CONTRAST CT MAXILLOFACIAL WITHOUT CONTRAST CT CERVICAL SPINE WITHOUT CONTRAST TECHNIQUE: Multidetector CT imaging of the head, cervical spine, and maxillofacial structures were performed using the standard protocol without intravenous contrast. Multiplanar CT image reconstructions of the cervical spine and maxillofacial structures were also generated. COMPARISON:  September 03, 2014 FINDINGS: CT HEAD FINDINGS There are multiple fractures in the right side of the face which will be described on the maxillofacial portion of the study. There is opacification of the right maxillary sinus. The remainder of the paranasal sinuses are normal. The mastoid air cells and middle ears are normal. There is soft tissue swelling in the right side of the face. The globes are intact. The remainder of the extracranial soft tissues are normal. There is high attenuation in the left side of the quadrigeminal plate cistern on series 3, image 11, consistent with subarachnoid hemorrhage. There is high attenuation in the sulci of the medial right frontal lobe such as on series 3, image  13 and image 15. These findings are new since January 2016. No subdural or epidural hemorrhage. Cerebellum and brainstem are normal. Basal cisterns are patent. Ventricles and sulci are normal for age and contain no blood. No mass effect or midline shift. No cortical ischemia or infarct. CT MAXILLOFACIAL FINDINGS There are complicated fractures through the right facial bones. The roof and medial walls of the right orbit are intact. There are fractures through the lateral wall which are mildly displaced. Displaced fractures are also seen through the inferior wall of the right orbit. Blood  and debris obscure the inferior rectus muscle but it appears to remain within the orbit with no evidence of entrapment. Displaced fractures are seen through the lateral wall of the right maxillary sinus extending into the floor. Anterior, posterior, lateral, inferior, and superior wall maxillary sinus fractures identified. The patient has 4 maxillary screws. The fractures through the floor of the right maxillary sinus appear to disrupt the cement around at least 1 of these right sided screws. The medial wall of the right maxillary sinus is intact. The pterygoid plates remain intact. There is a depressed fracture through the right zygomatic arch anteriorly and a nondisplaced fracture through the posterior right zygomatic arch. The left orbit is intact as is the left zygomatic arch. The mandible is also intact. CT CERVICAL SPINE FINDINGS C5 demonstrates retrolisthesis versus both C4 and C6 with mild focal kyphosis. Retrolisthesis of C5 versus C4 is 2.5 mm which is similar to the retrolisthesis at C5-6. There is minimal anterolisthesis of T1 versus T2. No other malalignment. No fractures. Multilevel degenerative changes most marked at C5-6. Degenerative changes also seen with right greater than left C5-6 neural foraminal narrowing. No critical canal stenosis. IMPRESSION: 1. Subarachnoid hemorrhage in the sulci along the medial right frontal lobe and in the left side of the quadrigeminal plate cistern. 2. Complex fracture involving the lateral and inferior walls of the right orbit without evidence of entrapment of the inferior rectus muscle. There are fractures through the anterior, posterior superior, inferior and lateral walls of the right maxillary sinus. The maxillary fractures affect at least 1 of the 2 maxillary screws. Right zygomatic arch fractures are noted. 3. The malalignment in the cervical spine spine described above is favored to be degenerative with no identified fracture or soft tissue swelling. Findings  called to Dr. Adela Lank Electronically Signed   By: Gerome Sam III M.D   On: 04/16/2016 16:43    Assessment/Plan: Doing well from our standpoint, diplopia improving   LOS: 1 day    Jennavecia Schwier S 04/18/2016, 10:27 AM

## 2016-04-18 NOTE — Progress Notes (Signed)
Trauma Service Note  Subjective: Right middle finger more swollen, constipated, pain controlled, tolerating diet  Objective: Vital signs in last 24 hours: Temp:  [98 F (36.7 C)-98.7 F (37.1 C)] 98.3 F (36.8 C) (08/26 0800) Pulse Rate:  [68-87] 70 (08/26 0900) Resp:  [8-22] 13 (08/26 0900) BP: (84-146)/(56-126) 135/69 (08/26 0900) SpO2:  [90 %-96 %] 95 % (08/26 0900) Last BM Date: 04/15/16  Intake/Output from previous day: 08/25 0701 - 08/26 0700 In: 1200 [I.V.:1200] Out: 500 [Urine:500] Intake/Output this shift: Total I/O In: 390 [P.O.:240; I.V.:150] Out: -   General: NAD  Lungs: CTAB  Abd: soft, NT, ND  Extremities: right arm in wrap, fingers with some swelling no ischemic signs  Neuro: AOx4  Lab Results: CBC   Recent Labs  04/17/16 0359 04/18/16 0357  WBC 5.0 4.5  HGB 10.9* 10.7*  HCT 33.8* 33.8*  PLT 172 156   BMET  Recent Labs  04/17/16 0359 04/18/16 0357  NA 138 139  K 4.3 3.8  CL 101 107  CO2 27 26  GLUCOSE 103* 99  BUN 11 8  CREATININE 1.02* 1.07*  CALCIUM 8.9 8.9   PT/INR No results for input(s): LABPROT, INR in the last 72 hours. ABG No results for input(s): PHART, HCO3 in the last 72 hours.  Invalid input(s): PCO2, PO2  Studies/Results: No results found.  Anti-infectives: Anti-infectives    None      Medications Scheduled Meds: . [START ON 04/30/2016] cyanocobalamin  1,000 mcg Intramuscular Q30 days  . DULoxetine  60 mg Oral BID  . famotidine  20 mg Oral Daily  . lamoTRIgine  300 mg Oral QHS  . levothyroxine  125 mcg Oral QAC breakfast  . LORazepam  1 mg Oral QHS  . morphine  30 mg Oral Q12H   Continuous Infusions: . 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 Venetia MaxonStern consulting R wrist FX - Dr. Merlyn LotKuzma evaluted and will see her in the  office next week R orbit/maxillary sinus/zygoma FXs - Dr. Suszanne Connerseoh to consult -consult optho for diploplia FEN - clears pending ENT eval VTE - PAS Dispo - transfer to floor   LOS: 1 day   De BlanchLuke Aaron Mckinzey Entwistle Trauma Surgeon [phone]--office [facility] 04/18/2016

## 2016-04-18 NOTE — Clinical Social Work Note (Signed)
Clinical Social Work Assessment  Patient Details  Name: Jillian Morris MRN: [id number] Date of Birth: [birth year]  Date of referral:  04/18/16               Reason for consult:  Facility Placement, Trauma                Permission sought to share information with:  Facility Sport and exercise psychologist, Family Supports Permission granted to share information::  Yes, Verbal Permission Granted  Name::     Ambulance person::  SNFs  Relationship::  Spouse  Contact Information:  [phone]  Housing/Transportation Living arrangements for the past 2 months:  [place] of Information:  Patient, Spouse Patient Interpreter Needed:  None Criminal Activity/Legal Involvement Pertinent to Current Situation/Hospitalization:  No - Comment as needed Significant Relationships:  Spouse Lives with:  Spouse Do you feel safe going back to the place where you live?  No Need for family participation in patient care:  Yes (Comment)  Care giving concerns:  CSW received referral for possible SNF placement at time of discharge if [facility] is unable to admit patient. CSW met with patient and patient's spouse at bedside regarding PT recommendation of SNF placement at time of discharge. Patient fell at her doctor's appointment. Per patient's spouse, patient's spouse is currently unable to care for patient at their home given patient's current physical needs and fall risk. Patient and patient's spouse expressed understanding of PT recommendation and are agreeable to SNF placement at time of discharge if CIR is unable to admit. CSW to continue to follow and assist with discharge planning needs.   Social Worker assessment / plan:  CSW spoke with patient and patient's spouse concerning possibility of rehab at [facility] before returning home. CSW completed SBIRT with patient. Patient stated she has not drank alcohol in about 8 months. She does not use any other substances that are not prescribed.   Employment status:   Retired Nurse, adult PT Recommendations:  Inpatient [place], [place] / Referral to community resources:  [place]  Patient/Family's Response to care:  Patient and patient's spouse recognize need for rehab before returning home and are agreeable to a SNF in [place]. Patient reported preference for [facility] if [facility] cannot admit her.  Patient/Family's Understanding of and Emotional Response to Diagnosis, Current Treatment, and Prognosis:  Patient/family is realistic regarding therapy needs and expressed being hopeful for SNF placement. No questions/concerns about plan or treatment.    Emotional Assessment Appearance:  Appears stated age Attitude/Demeanor/Rapport:  Other (Appropriate;Pleasant) Affect (typically observed):  Accepting, Adaptable, Appropriate, Pleasant Orientation:  Oriented to Self, Oriented to Situation, Oriented to Place, Oriented to  Time Alcohol / Substance use:  Not Applicable (Hasn't drank alcohol in 8 months; No substances) Psych involvement (Current and /or in the community):  No (Comment)  Discharge Needs  Concerns to be addressed:  Care Coordination Readmission within the last 30 days:  No Current discharge risk:  None Barriers to Discharge:  Continued Medical Work up   [employer], [place] 04/18/2016, 2:37 PM

## 2016-04-18 NOTE — [place] FL2 (Signed)
[place] MEDICAID FL2 LEVEL OF CARE SCREENING TOOL     IDENTIFICATION  Patient Name: Jillian Morris Birthdate: 09/01/1945 Sex: female Admission Date (Current Location): 04/16/2016  [facility] [place]   Facility and Address:  [facility], [address], [place], [place] [address]      Provider Number: [id number]  Attending Physician Name and Address:  Trauma Md, MD  Relative Name and Phone Number:  Gala RomneyDoug, spouse [phone]    Current Level of Care: Hospital Recommended Level of Care: Skilled Nursing Facility Prior Approval Number:    Date Approved/Denied:   PASRR Number: [id number]  Discharge Plan: SNF    Current Diagnoses: Patient Active Problem List   Diagnosis Date Noted  . SAH (subarachnoid hemorrhage) (HCC) 04/16/2016  . Achalasia 08/09/2013  . Stricture and stenosis of esophagus 03/30/2012  . Dysphagia, unspecified(787.20) 03/21/2012  . Special screening for malignant neoplasms, colon 03/21/2012  . Esophageal reflux 03/21/2012    Orientation RESPIRATION BLADDER Height & Weight     Self, Time, Situation, Place  O2 (Nasal cannula 2L) Continent Weight: 85.7 kg (188 lb 15 oz) Height:  5\' 2"  (157.5 cm)  BEHAVIORAL SYMPTOMS/MOOD NEUROLOGICAL BOWEL NUTRITION STATUS      Continent Diet (Please see DC Summary)  AMBULATORY STATUS COMMUNICATION OF NEEDS Skin   Limited Assist Verbally Normal                       Personal Care Assistance Level of Assistance  Bathing, Feeding, Dressing Bathing Assistance: Limited assistance Feeding assistance: Independent Dressing Assistance: Limited assistance     Functional Limitations Info   (Dentures)          SPECIAL CARE FACTORS FREQUENCY  PT (By licensed PT)     PT Frequency: 5x/week              Contractures      Additional Factors Info  Code Status, Allergies, Psychotropic Code Status Info: Full Allergies Info: Actonel Risedronate Sodium, Ambien  Zolpidem Tartrate, Augmentin Amoxicillin-pot Clavulanate, Depakote Divalproex Sodium, Inderal Propranolol, Nsaids, Penicillins, Silicon, Adhesive Tape, Latex, Nickel Psychotropic Info: Cymbalta; Ativan         Current Medications (04/18/2016):  This is the current hospital active medication list Current Facility-Administered Medications  Medication Dose Route Frequency Provider Last Rate Last Dose  . 0.9 %  sodium chloride infusion   Intravenous Continuous Jimmye NormanJames Wyatt, MD 50 mL/hr at 04/17/16 2152    . ALPRAZolam Prudy Feeler(XANAX) tablet 0.25 mg  0.25 mg Oral BID PRN Jimmye NormanJames Wyatt, MD      . bisacodyl (DULCOLAX) EC tablet 5 mg  5 mg Oral Daily PRN De BlanchLuke Aaron Kinsinger, MD      . Melene Muller[START ON 04/30/2016] cyanocobalamin ((VITAMIN B-12)) injection 1,000 mcg  1,000 mcg Intramuscular Q30 days Jimmye NormanJames Wyatt, MD      . DULoxetine (CYMBALTA) DR capsule 60 mg  60 mg Oral BID Jimmye NormanJames Wyatt, MD   60 mg at 04/18/16 0941  . famotidine (PEPCID) tablet 20 mg  20 mg Oral Daily Jimmye NormanJames Wyatt, MD   20 mg at 04/18/16 0941  . HYDROmorphone (DILAUDID) injection 1-2 mg  1-2 mg Intravenous Q2H PRN Jimmye NormanJames Wyatt, MD   1 mg at 04/18/16 [id number]  . lamoTRIgine (LAMICTAL) tablet 300 mg  300 mg Oral QHS Jimmye NormanJames Wyatt, MD   300 mg at 04/17/16 2151  . levothyroxine (SYNTHROID, LEVOTHROID) tablet 125 mcg  125 mcg Oral QAC breakfast Jimmye NormanJames Wyatt, MD  125 mcg at 04/18/16 0821  . LORazepam (ATIVAN) tablet 1 mg  1 mg Oral QHS Jimmye Norman, MD   1 mg at 04/17/16 2151  . morphine (MS CONTIN) 12 hr tablet 30 mg  30 mg Oral Q12H Jimmye Norman, MD   30 mg at 04/18/16 0941  . morphine (MSIR) tablet 30 mg  30 mg Oral Q6H PRN Jimmye Norman, MD   30 mg at 04/18/16 0336  . ondansetron (ZOFRAN) tablet 4 mg  4 mg Oral Q6H PRN Jimmye Norman, MD       Or  . ondansetron [facility]) injection 4 mg  4 mg Intravenous Q6H PRN Jimmye Norman, MD   4 mg at 04/16/16 2100  . rOPINIRole (REQUIP) tablet 1 mg  1 mg Oral QHS PRN Jimmye Norman, MD         Discharge Medications: Please see  discharge summary for a list of discharge medications.  Relevant Imaging Results:  Relevant Lab Results:   Additional Information SSN: [id number] [place], [place]

## 2016-04-18 NOTE — Progress Notes (Signed)
Subjective: Pt reports improvement of her diplopia. Still has trismus and pain with jaw movement.  Objective: Vital signs in last 24 hours: Temp:  [98 F (36.7 C)-98.7 F (37.1 C)] 98.3 F (36.8 C) (08/26 0800) Pulse Rate:  [68-87] 70 (08/26 0900) Resp:  [8-22] 13 (08/26 0900) BP: (84-146)/(56-126) 135/69 (08/26 0900) SpO2:  [90 %-96 %] 95 % (08/26 0900)  Physical Exam Constitutional: She appears well-developedand well-nourished. No distress.  Head: Normocephalic. Scattered facial abrasions Eyes: Conjunctivaeare normal. EOMI. No significant entrapment. No significant enophthalmus. Minimal diplopia. ENT:  Examination of the ears shows normal auricles and external auditory canals bilaterally. Both tympanic membranes are intact. Ecchymosis to right face, no significant nasal deformity, no nasal septal hematoma, Facial examination shows no asymmetry. Oral cavity examination shows no mucosal lacerations. Slight trismus is noted. Neck: Palpation of the neck reveals no lymphadenopathy or mass. The trachea is midline. The thyroid is not significantly enlarged. Cranial nerves 2-12 are all grossly in tact. Cardiovascular: Normal rateand regular rhythm.  Pulmonary/Chest: Effort normaland breath sounds normal. No respiratory distress.  Neurological: She is alert.  Skin: Skin is warmand dry.  Psychiatric: She has a normal mood and affect.    Recent Labs  04/17/16 0359 04/18/16 0357  WBC 5.0 4.5  HGB 10.9* 10.7*  HCT 33.8* 33.8*  PLT 172 156    Recent Labs  04/17/16 0359 04/18/16 0357  NA 138 139  K 4.3 3.8  CL 101 107  CO2 27 26  GLUCOSE 103* 99  BUN 11 8  CREATININE 1.02* 1.07*  CALCIUM 8.9 8.9    Medications:  I have reviewed the patient's current medications. Scheduled: . [START ON 04/30/2016] cyanocobalamin  1,000 mcg Intramuscular Q30 days  . DULoxetine  60 mg Oral BID  . famotidine  20 mg Oral Daily  . lamoTRIgine  300 mg Oral QHS  . levothyroxine  125 mcg  Oral QAC breakfast  . LORazepam  1 mg Oral QHS  . morphine  30 mg Oral Q12H   XBJ:YNWGNFAOZHPRN:ALPRAZolam, bisacodyl, HYDROmorphone (DILAUDID) injection, morphine, ondansetron **OR** ondansetron (ZOFRAN) IV, rOPINIRole  Assessment/Plan: Right tripod fractures, s/p fall.  - Pt's extraocular motion is intact without significant entrapment or enophthalmus. Her diplopia is likely a result of soft tissue edema, now improving.  - Her trismus is likely a result of her depressed zygomatic arch fractures.  Plan to repair her midface fractures tomorrow at 7:30AM.  NPO after midnight. - Will need ophtho eval.   LOS: 1 day   Nuh Lipton,SUI W 04/18/2016, 10:43 AM

## 2016-04-19 ENCOUNTER — Encounter (HOSPITAL_COMMUNITY): Admission: EM | Disposition: A | Discharge status: Rehabilitation Facility | Payer: Self-pay | Source: Home / Self Care

## 2016-04-19 ENCOUNTER — Encounter (HOSPITAL_COMMUNITY): Payer: Self-pay | Admitting: Certified Registered Nurse Anesthetist

## 2016-04-19 ENCOUNTER — Inpatient Hospital Stay (HOSPITAL_COMMUNITY): Payer: Medicare Other | Admitting: Anesthesiology

## 2016-04-19 HISTORY — PX: ORIF TRIPOD FRACTURE: SHX5211

## 2016-04-19 SURGERY — OPEN REDUCTION INTERNAL FIXATION (ORIF) TRIPOD FRACTURE
Anesthesia: General | Laterality: Right

## 2016-04-19 MED ORDER — FENTANYL CITRATE (PF) 100 MCG/2ML IJ SOLN
INTRAMUSCULAR | Status: AC
  Filled 2016-04-19: qty 2

## 2016-04-19 MED ORDER — ONDANSETRON HCL 4 MG/2ML IJ SOLN
INTRAMUSCULAR | Status: DC | PRN
  Administered 2016-04-19: 4 mg via INTRAVENOUS

## 2016-04-19 MED ORDER — BSS IO SOLN
INTRAOCULAR | Status: AC
  Filled 2016-04-19: qty 15

## 2016-04-19 MED ORDER — FENTANYL CITRATE (PF) 100 MCG/2ML IJ SOLN: 25.0000 ug | INTRAMUSCULAR | Status: DC | PRN

## 2016-04-19 MED ORDER — OXYCODONE HCL 5 MG PO TABS: 5.0000 mg | ORAL_TABLET | Freq: Once | ORAL | Status: DC | PRN

## 2016-04-19 MED ORDER — FENTANYL CITRATE (PF) 100 MCG/2ML IJ SOLN
INTRAMUSCULAR | Status: DC | PRN
  Administered 2016-04-19: 50 ug via INTRAVENOUS
  Administered 2016-04-19: 100 ug via INTRAVENOUS

## 2016-04-19 MED ORDER — PROPOFOL 10 MG/ML IV BOLUS
INTRAVENOUS | Status: AC
  Filled 2016-04-19: qty 20

## 2016-04-19 MED ORDER — ROCURONIUM BROMIDE 100 MG/10ML IV SOLN
INTRAVENOUS | Status: DC | PRN
  Administered 2016-04-19: 40 mg via INTRAVENOUS

## 2016-04-19 MED ORDER — ONDANSETRON HCL 4 MG/2ML IJ SOLN: 4.0000 mg | Freq: Once | INTRAMUSCULAR | Status: DC | PRN

## 2016-04-19 MED ORDER — ARTIFICIAL TEARS OP OINT
TOPICAL_OINTMENT | OPHTHALMIC | Status: DC | PRN
  Administered 2016-04-19: 1 via OPHTHALMIC

## 2016-04-19 MED ORDER — TOBRAMYCIN SULFATE 1.2 G IJ SOLR
INTRAMUSCULAR | Status: AC
  Filled 2016-04-19: qty 1.2

## 2016-04-19 MED ORDER — OXYCODONE HCL 5 MG/5ML PO SOLN: 5.0000 mg | Freq: Once | ORAL | Status: DC | PRN

## 2016-04-19 MED ORDER — LIDOCAINE-EPINEPHRINE 1 %-1:100000 IJ SOLN
INTRAMUSCULAR | Status: AC
  Filled 2016-04-19: qty 1

## 2016-04-19 MED ORDER — BSS IO SOLN
INTRAOCULAR | Status: DC | PRN
  Administered 2016-04-19: 15 mL

## 2016-04-19 MED ORDER — OXYMETAZOLINE HCL 0.05 % NA SOLN
NASAL | Status: AC
  Filled 2016-04-19: qty 15

## 2016-04-19 MED ORDER — MIDAZOLAM HCL 2 MG/2ML IJ SOLN
INTRAMUSCULAR | Status: AC
  Filled 2016-04-19: qty 2

## 2016-04-19 MED ORDER — POLYETHYLENE GLYCOL 3350 17 G PO PACK
17.0000 g | PACK | Freq: Every day | ORAL | Status: DC
  Administered 2016-04-19 – 2016-04-20 (×2): 17 g via ORAL
  Filled 2016-04-19 (×2): qty 1

## 2016-04-19 MED ORDER — SODIUM CHLORIDE 0.9 % IR SOLN
Status: DC | PRN
  Administered 2016-04-19: 1000 mL

## 2016-04-19 MED ORDER — CLINDAMYCIN PHOSPHATE 600 MG/50ML IV SOLN
INTRAVENOUS | Status: AC
  Filled 2016-04-19: qty 50

## 2016-04-19 MED ORDER — LIDOCAINE HCL (CARDIAC) 20 MG/ML IV SOLN
INTRAVENOUS | Status: DC | PRN
  Administered 2016-04-19: 40 mg via INTRAVENOUS

## 2016-04-19 MED ORDER — SUGAMMADEX SODIUM 200 MG/2ML IV SOLN
INTRAVENOUS | Status: DC | PRN
  Administered 2016-04-19: 200 mg via INTRAVENOUS

## 2016-04-19 MED ORDER — LIDOCAINE-EPINEPHRINE 1 %-1:100000 IJ SOLN
INTRAMUSCULAR | Status: DC | PRN
  Administered 2016-04-19: 4 mL

## 2016-04-19 MED ORDER — DOCUSATE SODIUM 100 MG PO CAPS
100.0000 mg | ORAL_CAPSULE | Freq: Two times a day (BID) | ORAL | Status: DC
  Administered 2016-04-19 – 2016-04-20 (×3): 100 mg via ORAL
  Filled 2016-04-19 (×3): qty 1

## 2016-04-19 MED ORDER — PROPOFOL 10 MG/ML IV BOLUS
INTRAVENOUS | Status: DC | PRN
  Administered 2016-04-19: 150 mg via INTRAVENOUS

## 2016-04-19 MED ORDER — LACTATED RINGERS IV SOLN
INTRAVENOUS | Status: DC | PRN
  Administered 2016-04-19: 08:00:00 via INTRAVENOUS

## 2016-04-19 MED ORDER — GELATIN ADSORBABLE OP FILM
ORAL_FILM | OPHTHALMIC | Status: AC
  Filled 2016-04-19: qty 1

## 2016-04-19 SURGICAL SUPPLY — 32 items
BAG DECANTER FOR FLEXI CONT (MISCELLANEOUS) IMPLANT
BLADE 10 SAFETY STRL DISP (BLADE) ×2 IMPLANT
BLADE SURG 15 STRL LF DISP TIS (BLADE) IMPLANT
BLADE SURG 15 STRL SS (BLADE)
CANISTER SUCTION 2500CC (MISCELLANEOUS) ×2 IMPLANT
CLEANER TIP ELECTROSURG 2X2 (MISCELLANEOUS) ×2 IMPLANT
CONFORMERS SILICONE 5649 (OPHTHALMIC RELATED) IMPLANT
DRAPE PROXIMA HALF (DRAPES) IMPLANT
ELECT COATED BLADE 2.86 ST (ELECTRODE) IMPLANT
ELECT NEEDLE BLADE 2-5/6 (NEEDLE) IMPLANT
ELECT REM PT RETURN 9FT ADLT (ELECTROSURGICAL) ×2
ELECTRODE REM PT RTRN 9FT ADLT (ELECTROSURGICAL) ×1 IMPLANT
GLOVE ECLIPSE 7.5 STRL STRAW (GLOVE) ×2 IMPLANT
GOWN STRL REUS W/ TWL LRG LVL3 (GOWN DISPOSABLE) ×2 IMPLANT
GOWN STRL REUS W/TWL LRG LVL3 (GOWN DISPOSABLE) ×2
KIT BASIN OR (CUSTOM PROCEDURE TRAY) ×2 IMPLANT
KIT ROOM TURNOVER OR (KITS) ×2 IMPLANT
NEEDLE HYPO 25GX1X1/2 BEV (NEEDLE) IMPLANT
NS IRRIG 1000ML POUR BTL (IV SOLUTION) ×2 IMPLANT
PAD ARMBOARD 7.5X6 YLW CONV (MISCELLANEOUS) ×4 IMPLANT
PENCIL BUTTON HOLSTER BLD 10FT (ELECTRODE) ×2 IMPLANT
PLATE MID FACE 5H Y 8MM (Plate) ×2 IMPLANT
SCISSORS WIRE ANG 4 3/4 DISP (INSTRUMENTS) IMPLANT
SCREW MIDFACE 1.7X3MM SLF TAP (Screw) ×6 IMPLANT
SCREW MIDFACE 1.9X3MM (Screw) ×2 IMPLANT
SUT PLAIN 6 0 TG1408 (SUTURE) IMPLANT
SUT PROLENE 5 0 C1 (SUTURE) IMPLANT
SUT VIC AB 3-0 FS2 27 (SUTURE) IMPLANT
TOWEL OR 17X24 6PK STRL BLUE (TOWEL DISPOSABLE) ×2 IMPLANT
TRAY ENT MC OR (CUSTOM PROCEDURE TRAY) ×2 IMPLANT
TUBING IRRIGATION (MISCELLANEOUS) IMPLANT
WATER STERILE IRR 1000ML POUR (IV SOLUTION) ×2 IMPLANT

## 2016-04-19 NOTE — Clinical Social Work Note (Signed)
CSW met with pt and husband to discuss discharge planning and PT recommendation for CIR. Pt groggy and pt husband agreeable to CIR for short-term rehab. Pt husband also stated he would like [facility] for SNF as back up. CSW will continue to follow pt to provide assistance with discharge.

## 2016-04-19 NOTE — Consult Note (Signed)
Reason for consult:  Rule out ocular damage after fall.  Request by trauma service  HPI: Jillian Morris is an 70 y.o. female.  She fell at her ophthalmologist's office (She is an  Ophthalmology patient of Dr. Hillis Range) on Wednesday.  She broke right arm and facial bones.  She has been having double vision, but it has been improving since the swelling has started to go down.  Other than diplopia, she has not noticed any decreased vision.  No floaters or flashes of light.  She had facial reconstruction this morning.  No current eye pain.  Past Medical History:  Diagnosis Date  . Acne rosacea   . Anemia    s/p gastric bypass, malabsorption  . Anxiety   . Arthritis   . B12 deficiency   . Chronic fatigue fibromyalgia syndrome   . Chronic low back pain   . Depression   . Fibromyalgia   . GERD (gastroesophageal reflux disease)   . Graves disease   . H/O hiatal hernia   . Hypothyroidism   . IBS (irritable bowel syndrome)   . Lymphedema   . Meralgia paraesthetica   . Sjogren's syndrome ([place])   . Status post dilation of esophageal narrowing   . Stroke [facility])    Past Surgical History:  Procedure Laterality Date  . ABDOMINAL HYSTERECTOMY    . APPENDECTOMY    . BALLOON DILATION  03/30/2012   Procedure: BALLOON DILATION;  Surgeon: Inda Castle, MD;  Location: [facility];  Service: Endoscopy;  Laterality: N/A;  . BOTOX INJECTION N/A 12/26/2014   Procedure: BOTOX INJECTION;  Surgeon: Inda Castle, MD;  Location: [facility];  Service: Endoscopy;  Laterality: N/A;  . ENDOMETRIAL ABLATION     x 4  . ESOPHAGEAL MANOMETRY N/A 05/15/2013   Procedure: ESOPHAGEAL MANOMETRY (EM);  Surgeon: Inda Castle, MD;  Location: [facility];  Service: Endoscopy;  Laterality: N/A;  . ESOPHAGOGASTRODUODENOSCOPY  03/30/2012   Procedure: ESOPHAGOGASTRODUODENOSCOPY (EGD);  Surgeon: Inda Castle, MD;  Location: [facility];  Service: Endoscopy;  Laterality: N/A;  . ESOPHAGOGASTRODUODENOSCOPY  05/04/2012    Procedure: ESOPHAGOGASTRODUODENOSCOPY (EGD);  Surgeon: Inda Castle, MD;  Location: [facility];  Service: Endoscopy;  Laterality: N/A;  . ESOPHAGOGASTRODUODENOSCOPY N/A 12/26/2014   Procedure: ESOPHAGOGASTRODUODENOSCOPY (EGD);  Surgeon: Inda Castle, MD;  Location: [facility];  Service: Endoscopy;  Laterality: N/A;  botox injection  . GASTRIC BYPASS    . LAMINECTOMY     L3-L5  . 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 THYROIDECTOMY     Family History  Problem Relation Age of Onset  . Prostate cancer Maternal Grandfather   . Lymphoma Maternal Uncle   . Heart disease Father   . Heart disease Sister   . Irritable bowel syndrome Sister   . Dementia Mother   . Stroke Maternal Grandmother   . Appendicitis Paternal Grandmother   . Colon cancer Neg Hx    Current Facility-Administered Medications  Medication Dose Route Frequency Provider Last Rate Last Dose  . 0.9 %  sodium chloride infusion   Intravenous Continuous Judeth Horn, MD 50 mL/hr at 04/18/16 1535    . ALPRAZolam Duanne Moron) tablet 0.25 mg  0.25 mg Oral BID PRN Judeth Horn, MD      . bisacodyl (DULCOLAX) EC tablet 5 mg  5 mg Oral Daily PRN Arta Bruce Kinsinger, MD      . clindamycin (CLEOCIN) 600 MG/50ML IVPB           . [  START ON 04/30/2016] cyanocobalamin ((VITAMIN B-12)) injection 1,000 mcg  1,000 mcg Intramuscular Q30 days Judeth Horn, MD      . docusate sodium (COLACE) capsule 100 mg  100 mg Oral BID Lisette Abu, PA-C      . DULoxetine (CYMBALTA) DR capsule 60 mg  60 mg Oral BID Judeth Horn, MD   60 mg at 04/18/16 2122  . famotidine (PEPCID) tablet 20 mg  20 mg Oral Daily Judeth Horn, MD   20 mg at 04/18/16 0941  . HYDROmorphone (DILAUDID) injection 1-2 mg  1-2 mg Intravenous Q2H PRN Judeth Horn, MD   1 mg at 04/18/16 9485  . lamoTRIgine (LAMICTAL) tablet 300 mg  300 mg Oral QHS Judeth Horn, MD   300 mg at 04/18/16 2122  . levothyroxine (SYNTHROID, LEVOTHROID) tablet 125 mcg  125 mcg Oral QAC breakfast Judeth Horn, MD   125 mcg at 04/18/16 [phone]  . LORazepam (ATIVAN) tablet 1 mg  1 mg Oral QHS Judeth Horn, MD   1 mg at 04/18/16 2122  . morphine (MS CONTIN) 12 hr tablet 30 mg  30 mg Oral Q12H Judeth Horn, MD   30 mg at 04/18/16 2122  . morphine (MSIR) tablet 30 mg  30 mg Oral Q6H PRN Judeth Horn, MD   30 mg at 04/18/16 1450  . ondansetron (ZOFRAN) tablet 4 mg  4 mg Oral Q6H PRN Judeth Horn, MD       Or  . ondansetron [facility]) injection 4 mg  4 mg Intravenous Q6H PRN Judeth Horn, MD   4 mg at 04/16/16 2100  . polyethylene glycol (MIRALAX / GLYCOLAX) packet 17 g  17 g Oral Daily Lisette Abu, PA-C      . rOPINIRole (REQUIP) tablet 1 mg  1 mg Oral QHS PRN Judeth Horn, MD       Allergies  Allergen Reactions  . Actonel [Risedronate Sodium]     GI Upset  . Ambien [Zolpidem Tartrate] Other (See Comments)    Went crazy, hallucinations  . Augmentin [Amoxicillin-Pot Clavulanate] Other (See Comments)    GI upset  . Depakote [Divalproex Sodium]     Hallucinations  . Inderal [Propranolol] Other (See Comments)    unknown  . Nsaids Other (See Comments)    Pt. Had gastric bypass surgery   . Penicillins Other (See Comments)    Unknown childhood reaction  . Silicon Nausea And Vomiting    Thyroid storm  . Adhesive [Tape] Itching and Rash    Please use "paper" tape  . Latex Swelling and Rash    Redness lips  . Nickel Itching and Rash    Other reaction(s): Contact Dermatitis (intolerance)   Social History   Social History  . Marital status: Married    Spouse name: N/A  . Number of children: 0  . Years of education: N/A   Occupational History  . housewife    Social History Main Topics  . Smoking status: Never Smoker  . Smokeless tobacco: Never Used  . Alcohol use No  . Drug use: No  . Sexual activity: Not on file   Other Topics Concern  . Not on file   Social History Narrative   Right handed, Married, Step kids 2, Caffeine 4-5 cups daily, graduate school   Rare caffeine use      Review of systems: ROS:  Neuro:  No current headache, +drowsy after surgery, CV:  No chest pain, Pulm:  No SOB, GI:  No nausea, MS:  Right arm broken.  Derm:  No rash,   Physical Exam:  Patient lying in bed.  Comfortable.  Well nourished Blood pressure (!) 125/98, pulse 90, temperature 98.5 F (36.9 C), resp. rate 16, height 5' 2"  (1.575 m), weight 85.7 kg (188 lb 15 oz), SpO2 96 %.   [facility] [place]  At least 20/200 at near.  Patient cannot put on her reading glasses due to bandages of right temple  Pupils:   OD round, reactive to light, no APD            OS round, reactive to light, no APD  IOP (T pen)  OD 16  OS  15  CVF: OD full to light   OS full to light  Motility:  OD: -1 abduction and -1 upgaze  OS full ductions  Balance/alignment:  Ortho by Luiz Ochoa, but only in primary gaze.  Slight ET in right gaze   Slit lamp examination:                                 OD                                       External/adnexa:  Ecchymosis of right lids and mild edema.  +bandages of R temple                                     Lids/lashes:        See above.  No lacerations.  Minimal edema                                     Conjunctiva        Very Mild sub conj hemorrhage        Cornea:              Clear                  AC:                     Deep, quiet                                Iris:                     Normal        Lens:                 Nuclear sclerosis         01                             OS                                       External/adnexa: Normal  Lids/lashes:        Normal                                      Conjunctiva        White, quiet        Cornea:              Clear                  AC:                     Deep, quiet                                Iris:                     Normal        Lens:                  Nuclear sclerosis       Dilated fundus exam: (Neo 2.5; Myd 1%)      OD Vitreous            Clear, quiet                                 Optic Disc:       Normal, perfused                      Macula:             Flat                                            Vessels:           Normal caliber,distribution         Periphery:         Flat, attached                                      OS Vitreous            Clear, quiet                                Optic Disc:       Normal, perfused                      Macula:             Flat                                            Vessels:           Normal caliber,distribution         Periphery:         Flat, attached        Labs/studies: Results for orders placed or performed  during the hospital encounter of 04/16/16 (from the past 48 hour(s))  CBC     Status: Abnormal   Collection Time: 04/18/16  3:57 AM  Result Value Ref Range   WBC 4.5 4.0 - 10.5 K/uL   RBC 3.61 (L) 3.87 - 5.11 MIL/uL   Hemoglobin 10.7 (L) 12.0 - 15.0 g/dL   HCT 33.8 (L) 36.0 - 46.0 %   MCV 93.6 78.0 - 100.0 fL   MCH 29.6 26.0 - 34.0 pg   MCHC 31.7 30.0 - 36.0 g/dL   RDW 12.7 11.5 - 15.5 %   Platelets 156 150 - 400 K/uL  Basic metabolic panel     Status: Abnormal   Collection Time: 04/18/16  3:57 AM  Result Value Ref Range   Sodium 139 135 - 145 mmol/L   Potassium 3.8 3.5 - 5.1 mmol/L   Chloride 107 101 - 111 mmol/L   CO2 26 22 - 32 mmol/L   Glucose, Bld 99 65 - 99 mg/dL   BUN 8 6 - 20 mg/dL   Creatinine, Ser 1.07 (H) 0.44 - 1.00 mg/dL   Calcium 8.9 8.9 - 10.3 mg/dL   GFR calc non Af Amer 51 (L) >60 mL/min   GFR calc Af Amer 59 (L) >60 mL/min    Comment: (NOTE) The eGFR has been calculated using the CKD EPI equation. This calculation has not been validated in all clinical situations. eGFR's persistently <60 mL/min signify possible Chronic Kidney Disease.    Anion gap 6 5 - 15   No results found.                           Assessment and Plan: Right orbital trauma.  Status post surgical repair this morning by ENT. Mild EOM restriction in upgaze and abduction  OD.She has diplopia in right gaze.  This will likely continue to improve as swelling diminishes.  No retinal detachment or other ocular trauma.  Plan:  Follow up with her own ophthalmologist (Dr. Bing Plume) in one month.  Call him ASAP if diplopia worsens or if she experiences loss of vision or flashes/floaters.  I gave patient's husband my card in case they cannot get in with Dr. Bing Plume.   All of the above information was relayed to the patient and/or patient family.  Ophthalmic warning signs and symptoms were reviewed, and clear instructions for immediate phone contact and/or immediate return to the ED or clinic were provided should any of these signs or symptoms occur.  Follow up contact information was provided.  All questions were answered.   Zyere Jiminez L 04/19/2016, 1:22 PM  [facility] Ophthalmology [phone]

## 2016-04-19 NOTE — Progress Notes (Signed)
Patient ID: Jillian Morris, female   DOB: 12/03/1945, 70 y.o.   MRN: [id number]   LOS: 2 days   Subjective: Doing well s/p ORIF tripod fx   Objective: Vital signs in last 24 hours: Temp:  [97.6 F (36.4 C)-98.5 F (36.9 C)] 98.5 F (36.9 C) (08/27 0956) Pulse Rate:  [72-118] 90 (08/27 1021) Resp:  [12-18] 16 (08/27 1021) BP: (125-150)/(63-98) 125/98 (08/27 1021) SpO2:  [90 %-99 %] 96 % (08/27 1021) Last BM Date: 04/15/16   Physical Exam General appearance: alert and no distress Resp: clear to auscultation bilaterally Cardio: regular rate and rhythm GI: normal findings: bowel sounds normal and soft, non-tender  Extremities: NVI   Assessment/Plan: Fall TBI/SAH- stable on F/U CT, Dr. Venetia MaxonStern consulting R wrist FX- Apparently Dr. Merlyn LotKuzma want to operate on wrist, didn't think she'd be in hospital this long. Will contact him tomorrow to discuss plan. R orbit/maxillary sinus/zygoma FXs s/p ORIF- per Dr. Suszanne Connerseoh, appreciate ophtho consult FEN- No issues VTE- SCD's Dispo - CIR when bed available    Freeman CaldronMichael J. Laithan Conchas, PA-C Pager: [phone] General Trauma PA Pager: [phone]  04/19/2016

## 2016-04-19 NOTE — Clinical Social Work Placement (Signed)
   CLINICAL SOCIAL WORK PLACEMENT  NOTE  Date:  04/19/2016  Patient Details  Name: Jillian Morris MRN: [id number] Date of Birth: 10/21/1945  Clinical Social Work is seeking post-discharge placement for this patient at the Skilled  Nursing Facility level of care (*CSW will initial, date and re-position this form in  chart as items are completed):      Patient/family provided with [facility] Clinical Social Work Department's list of facilities offering this level of care within the geographic area requested by the patient (or if unable, by the patient's family).      Patient/family informed of their freedom to choose among providers that offer the needed level of care, that participate in Medicare, Medicaid or managed care program needed by the patient, have an available bed and are willing to accept the patient.      Patient/family informed of [facility]'s ownership interest in [facility] and [facility], as well as of the fact that they are under no obligation to receive care at these facilities.  PASRR submitted to EDS on       PASRR number received on       Existing PASRR number confirmed on 04/18/16     FL2 transmitted to all facilities in geographic area requested by pt/family on 04/18/16     FL2 transmitted to all facilities within larger geographic area on       Patient informed that his/her managed care company has contracts with or will negotiate with certain facilities, including the following:            Patient/family informed of bed offers received.  Patient chooses bed at       Physician recommends and patient chooses bed at      Patient to be transferred to   on  .  Patient to be transferred to facility by       Patient family notified on   of transfer.  Name of family member notified:        PHYSICIAN Please sign FL2, Please prepare priority discharge summary, including medications, Please prepare prescriptions     Additional Comment:     _______________________________________________ Terald Sleeperakiyah T Shamiya Demeritt, LCSW 04/19/2016, 12:11 PM

## 2016-04-19 NOTE — Op Note (Signed)
DATE OF PROCEDURE:  04/19/2016                              OPERATIVE REPORT  SURGEON:  Newman PiesSu Mellony Danziger, MD  PREOPERATIVE DIAGNOSES: 1. Depressed right tripod fractures  POSTOPERATIVE DIAGNOSES: 1. Depressed right tripod fractures  PROCEDURE PERFORMED:  Open reduction internal fixation of right depressed tripod/zygomatic arch fractures with multiple approaches (CPT 21365)  ANESTHESIA:  General endotracheal tube anesthesia.  COMPLICATIONS:  None.  ESTIMATED BLOOD LOSS:  Less than 30ml  INDICATION FOR PROCEDURE:  Jillian Morris is a 70 y.o. female who had an accidental fall on 04/16/16. It resulted in significant facial trauma. The patient was noted to have a depressed right tripod fracture. Her right zygomatic arch was noted to be displaced and depressed, causing impingement on the coronoid process of the mandible. The patient has been expressing trismus since the injury.  Based on the above findings, the decision was made for the patient to undergo the above-stated procedure. Likelihood of success in reducing symptoms was also discussed.  The risks, benefits, alternatives, and details of the procedure were discussed with the patient and her husband.  Questions were invited and answered.  Informed consent was obtained.  DESCRIPTION:  The patient was taken to the operating room and placed supine on the operating table.  General endotracheal tube anesthesia was administered by the anesthesiologist.  The patient was positioned and prepped and draped in a standard fashion.   1% lidocaine with 1-100,000 epinephrine was infiltrated into the right maxillary gingiva sulcus. Local anesthesia was also administered behind the right temporal hairline. A right maxillary gingiva incision was made. The incision was carried down to the right maxillary periosteum. The soft tissue over lying the right midface was elevated in the standard fashion. Multiple fracture fragments were noted. The fragments were noted to be  depressed. The right inferior orbital nerve was noted and preserved. A separate incision was made behind the right temporal hairline. The incision was carried down to the level of the temporalis fascia. The temporalis fascia was incised. A tunnel was then created beneath the temporalis fascia, down to the zygomatic arch. Using a large Kelly clamp, the depressed zygomatic arch fragments were elevated and reduced. By applying upward pressure both superiorly and inferiorly, the depressed maxillary fragments were elevated. The midface was then plated with a titanium plate and four 3 mm titanium screws. The incisions were copiously irrigated. The temporal incision was closed with interrupted Prolene sutures. The gingiva sulcus incision was closed with interrupted 4-0 Vicryl sutures.  The care of the patient was turned over to the anesthesiologist.  The patient was awakened from anesthesia without difficulty.  She was extubated and transferred to the recovery room in good condition.  OPERATIVE FINDINGS:  Depressed right malar and zygomatic fractures.  SPECIMEN:  None.  FOLLOWUP CARE:  The patient will be returned to her surgical floor. The sutures will be removed in one week. Darletta MollEOH,SUI W 04/19/2016 9:19 AM

## 2016-04-19 NOTE — Anesthesia Procedure Notes (Signed)
Procedure Name: Intubation Date/Time: 04/19/2016 8:10 AM Performed by: Tillman AbideHAWKINS, Sevin Langenbach B Pre-anesthesia Checklist: Patient identified, Emergency Drugs available, Suction available and Patient being monitored Patient Re-evaluated:Patient Re-evaluated prior to inductionOxygen Delivery Method: Circle System Utilized Preoxygenation: Pre-oxygenation with 100% oxygen Intubation Type: IV induction Ventilation: Mask ventilation without difficulty Laryngoscope Size: Mac and 3 Grade View: Grade I Tube type: Oral Number of attempts: 1 Airway Equipment and Method: Stylet and Oral airway Placement Confirmation: ETT inserted through vocal cords under direct vision,  positive ETCO2 and breath sounds checked- equal and bilateral Secured at: 21 cm Tube secured with: Tape Dental Injury: Teeth and Oropharynx as per pre-operative assessment

## 2016-04-19 NOTE — Anesthesia Preprocedure Evaluation (Signed)
Anesthesia Evaluation  Patient identified by MRN, date of birth, ID band Patient awake    Reviewed: Allergy & Precautions, NPO status , Patient's Chart, lab work & pertinent test results  Airway Mallampati: II  TM Distance: >3 FB Neck ROM: Full    Dental  (+) Edentulous Upper, Edentulous Lower   Pulmonary    breath sounds clear to auscultation       Cardiovascular  Rhythm:Regular Rate:Normal     Neuro/Psych    GI/Hepatic   Endo/Other    Renal/GU      Musculoskeletal   Abdominal   Peds  Hematology   Anesthesia Other Findings   Reproductive/Obstetrics                             Anesthesia Physical Anesthesia Plan  ASA: III  Anesthesia Plan: General   Post-op Pain Management:    Induction: Intravenous  Airway Management Planned: Oral ETT  Additional Equipment:   Intra-op Plan:   Post-operative Plan: Extubation in OR  Informed Consent: I have reviewed the patients History and Physical, chart, labs and discussed the procedure including the risks, benefits and alternatives for the proposed anesthesia with the patient or authorized representative who has indicated his/her understanding and acceptance.   Dental advisory given  Plan Discussed with: CRNA and Anesthesiologist  Anesthesia Plan Comments: (R. Orbital Fracture Small SAH stable  S/P gastric bypass  GERD  Plan GA with oral ETT  Kipp Broodavid Zhoe Catania)        Anesthesia Quick Evaluation

## 2016-04-19 NOTE — Anesthesia Postprocedure Evaluation (Signed)
Anesthesia Post Note  Patient: Jillian Morris  Procedure(s) Performed: Procedure(s) (LRB): OPEN REDUCTION INTERNAL FIXATION (ORIF) TRIPOD FRACTURE LIEBINGER MIDFACE NO DRILL (Right)  Patient location during evaluation: PACU Anesthesia Type: General Level of consciousness: awake, awake and alert and oriented Pain management: pain level controlled Vital Signs Assessment: post-procedure vital signs reviewed and stable Respiratory status: spontaneous breathing, nonlabored ventilation and respiratory function stable Cardiovascular status: blood pressure returned to baseline Anesthetic complications: no    Last Vitals:  Vitals:   04/19/16 0956 04/19/16 1021  BP:  (!) 125/98  Pulse: 81 90  Resp: 14 16  Temp: 36.9 C     Last Pain:  Vitals:   04/19/16 1021  TempSrc:   PainSc: 2                  Meklit Cotta COKER

## 2016-04-19 NOTE — Transfer of Care (Signed)
Immediate Anesthesia Transfer of Care Note  Patient: Jillian Morris  Procedure(s) Performed: Procedure(s): OPEN REDUCTION INTERNAL FIXATION (ORIF) TRIPOD FRACTURE LIEBINGER MIDFACE NO DRILL (Right)  Patient Location: PACU  Anesthesia Type:General  Level of Consciousness: awake, alert  and patient cooperative  Airway & Oxygen Therapy: Patient Spontanous Breathing  Post-op Assessment: Report given to RN and Post -op Vital signs reviewed and stable  Post vital signs: Reviewed and stable  Last Vitals:  Vitals:   04/19/16 0612 04/19/16 0918  BP: 129/64 (!) 148/90  Pulse: 90 (!) 118  Resp: 16 17  Temp: 36.7 C     Last Pain:  Vitals:   04/19/16 0612  TempSrc: Oral  PainSc:       Patients Stated Pain Goal: 3 (04/18/16 1000)  Complications: No apparent anesthesia complications

## 2016-04-20 ENCOUNTER — Inpatient Hospital Stay (HOSPITAL_COMMUNITY)
Admission: RE | Admit: 2016-04-20 | Discharge: 2016-05-01 | DRG: 941 | Disposition: A | Discharge status: Home / Self Care | Payer: Medicare Other | Source: Intra-hospital | Attending: Physical Medicine & Rehabilitation | Admitting: Physical Medicine & Rehabilitation

## 2016-04-20 ENCOUNTER — Encounter (HOSPITAL_COMMUNITY): Payer: Self-pay | Admitting: Otolaryngology

## 2016-04-20 DIAGNOSIS — G8929 Other chronic pain: Secondary | ICD-10-CM

## 2016-04-20 DIAGNOSIS — S0281XD Fracture of other specified skull and facial bones, right side, subsequent encounter for fracture with routine healing: Secondary | ICD-10-CM

## 2016-04-20 DIAGNOSIS — I609 Nontraumatic subarachnoid hemorrhage, unspecified: Secondary | ICD-10-CM

## 2016-04-20 DIAGNOSIS — S52509S Unspecified fracture of the lower end of unspecified radius, sequela: Secondary | ICD-10-CM | POA: Diagnosis not present

## 2016-04-20 DIAGNOSIS — S066X3S Traumatic subarachnoid hemorrhage with loss of consciousness of 1 hour to 5 hours 59 minutes, sequela: Secondary | ICD-10-CM | POA: Diagnosis not present

## 2016-04-20 DIAGNOSIS — M797 Fibromyalgia: Secondary | ICD-10-CM

## 2016-04-20 DIAGNOSIS — S069X3S Unspecified intracranial injury with loss of consciousness of 1 hour to 5 hours 59 minutes, sequela: Secondary | ICD-10-CM | POA: Diagnosis not present

## 2016-04-20 DIAGNOSIS — Z79891 Long term (current) use of opiate analgesic: Secondary | ICD-10-CM | POA: Diagnosis not present

## 2016-04-20 DIAGNOSIS — W19XXXA Unspecified fall, initial encounter: Secondary | ICD-10-CM | POA: Diagnosis present

## 2016-04-20 DIAGNOSIS — S52501D Unspecified fracture of the lower end of right radius, subsequent encounter for closed fracture with routine healing: Secondary | ICD-10-CM

## 2016-04-20 DIAGNOSIS — S52501A Unspecified fracture of the lower end of right radius, initial encounter for closed fracture: Secondary | ICD-10-CM

## 2016-04-20 DIAGNOSIS — Z96651 Presence of right artificial knee joint: Secondary | ICD-10-CM | POA: Diagnosis not present

## 2016-04-20 DIAGNOSIS — S066X9D Traumatic subarachnoid hemorrhage with loss of consciousness of unspecified duration, subsequent encounter: Secondary | ICD-10-CM | POA: Diagnosis present

## 2016-04-20 DIAGNOSIS — W19XXXS Unspecified fall, sequela: Secondary | ICD-10-CM

## 2016-04-20 DIAGNOSIS — S62101S Fracture of unspecified carpal bone, right wrist, sequela: Secondary | ICD-10-CM

## 2016-04-20 DIAGNOSIS — S02402S Zygomatic fracture, unspecified, sequela: Secondary | ICD-10-CM

## 2016-04-20 DIAGNOSIS — M545 Low back pain: Secondary | ICD-10-CM

## 2016-04-20 DIAGNOSIS — Z8673 Personal history of transient ischemic attack (TIA), and cerebral infarction without residual deficits: Secondary | ICD-10-CM

## 2016-04-20 DIAGNOSIS — S0292XA Unspecified fracture of facial bones, initial encounter for closed fracture: Secondary | ICD-10-CM | POA: Diagnosis present

## 2016-04-20 DIAGNOSIS — S62101A Fracture of unspecified carpal bone, right wrist, initial encounter for closed fracture: Secondary | ICD-10-CM

## 2016-04-20 DIAGNOSIS — S066X2S Traumatic subarachnoid hemorrhage with loss of consciousness of 31 minutes to 59 minutes, sequela: Secondary | ICD-10-CM | POA: Diagnosis not present

## 2016-04-20 DIAGNOSIS — E039 Hypothyroidism, unspecified: Secondary | ICD-10-CM

## 2016-04-20 DIAGNOSIS — S0292XS Unspecified fracture of facial bones, sequela: Secondary | ICD-10-CM

## 2016-04-20 DIAGNOSIS — S52501S Unspecified fracture of the lower end of right radius, sequela: Secondary | ICD-10-CM | POA: Diagnosis not present

## 2016-04-20 DIAGNOSIS — S0240CD Maxillary fracture, right side, subsequent encounter for fracture with routine healing: Secondary | ICD-10-CM

## 2016-04-20 DIAGNOSIS — F419 Anxiety disorder, unspecified: Secondary | ICD-10-CM | POA: Diagnosis not present

## 2016-04-20 DIAGNOSIS — R269 Unspecified abnormalities of gait and mobility: Secondary | ICD-10-CM | POA: Diagnosis not present

## 2016-04-20 DIAGNOSIS — S02402A Zygomatic fracture, unspecified, initial encounter for closed fracture: Secondary | ICD-10-CM

## 2016-04-20 DIAGNOSIS — S066X0S Traumatic subarachnoid hemorrhage without loss of consciousness, sequela: Secondary | ICD-10-CM

## 2016-04-20 DIAGNOSIS — W1830XD Fall on same level, unspecified, subsequent encounter: Secondary | ICD-10-CM | POA: Diagnosis not present

## 2016-04-20 DIAGNOSIS — Z79899 Other long term (current) drug therapy: Secondary | ICD-10-CM

## 2016-04-20 DIAGNOSIS — F329 Major depressive disorder, single episode, unspecified: Secondary | ICD-10-CM | POA: Diagnosis not present

## 2016-04-20 DIAGNOSIS — K219 Gastro-esophageal reflux disease without esophagitis: Secondary | ICD-10-CM

## 2016-04-20 DIAGNOSIS — R5382 Chronic fatigue, unspecified: Secondary | ICD-10-CM | POA: Diagnosis not present

## 2016-04-20 DIAGNOSIS — S066X3A Traumatic subarachnoid hemorrhage with loss of consciousness of 1 hour to 5 hours 59 minutes, initial encounter: Secondary | ICD-10-CM

## 2016-04-20 DIAGNOSIS — E538 Deficiency of other specified B group vitamins: Secondary | ICD-10-CM | POA: Diagnosis not present

## 2016-04-20 DIAGNOSIS — S0240ED Zygomatic fracture, right side, subsequent encounter for fracture with routine healing: Secondary | ICD-10-CM

## 2016-04-20 DIAGNOSIS — Z9884 Bariatric surgery status: Secondary | ICD-10-CM | POA: Diagnosis not present

## 2016-04-20 HISTORY — DX: Fracture of unspecified carpal bone, right wrist, initial encounter for closed fracture: S62.101A

## 2016-04-20 MED ORDER — ONDANSETRON HCL 4 MG/2ML IJ SOLN: 4.0000 mg | Freq: Four times a day (QID) | INTRAMUSCULAR | Status: DC | PRN

## 2016-04-20 MED ORDER — SORBITOL 70 % SOLN
30.0000 mL | Freq: Every day | Status: DC | PRN
  Administered 2016-04-24: 30 mL via ORAL
  Filled 2016-04-20 (×2): qty 30

## 2016-04-20 MED ORDER — CYANOCOBALAMIN 1000 MCG/ML IJ SOLN
1000.0000 ug | INTRAMUSCULAR | Status: DC
  Administered 2016-04-30: 1000 ug via INTRAMUSCULAR
  Filled 2016-04-20: qty 1

## 2016-04-20 MED ORDER — ALPRAZOLAM 0.25 MG PO TABS
0.2500 mg | ORAL_TABLET | Freq: Two times a day (BID) | ORAL | Status: DC | PRN
  Administered 2016-04-24 – 2016-04-29 (×5): 0.25 mg via ORAL
  Filled 2016-04-20 (×6): qty 1

## 2016-04-20 MED ORDER — BISACODYL 5 MG PO TBEC: 5.0000 mg | DELAYED_RELEASE_TABLET | Freq: Every day | ORAL | Status: DC | PRN

## 2016-04-20 MED ORDER — POLYETHYLENE GLYCOL 3350 17 G PO PACK
17.0000 g | PACK | Freq: Every day | ORAL | Status: DC
  Administered 2016-04-21 – 2016-04-30 (×4): 17 g via ORAL
  Filled 2016-04-20 (×9): qty 1

## 2016-04-20 MED ORDER — ROPINIROLE HCL 1 MG PO TABS
1.0000 mg | ORAL_TABLET | Freq: Every evening | ORAL | Status: DC | PRN
  Filled 2016-04-20: qty 1

## 2016-04-20 MED ORDER — ONDANSETRON HCL 4 MG PO TABS: 4.0000 mg | ORAL_TABLET | Freq: Four times a day (QID) | ORAL | Status: DC | PRN

## 2016-04-20 MED ORDER — LEVOTHYROXINE SODIUM 25 MCG PO TABS
125.0000 ug | ORAL_TABLET | Freq: Every day | ORAL | Status: DC
  Administered 2016-04-21 – 2016-05-01 (×11): 125 ug via ORAL
  Filled 2016-04-20 (×11): qty 1

## 2016-04-20 MED ORDER — LORAZEPAM 0.5 MG PO TABS
1.0000 mg | ORAL_TABLET | Freq: Every day | ORAL | Status: DC
  Administered 2016-04-20 – 2016-04-21 (×2): 1 mg via ORAL
  Filled 2016-04-20 (×2): qty 2

## 2016-04-20 MED ORDER — MORPHINE SULFATE ER 30 MG PO TBCR
30.0000 mg | EXTENDED_RELEASE_TABLET | Freq: Two times a day (BID) | ORAL | Status: DC
  Administered 2016-04-20 – 2016-05-01 (×21): 30 mg via ORAL
  Filled 2016-04-20 (×21): qty 1

## 2016-04-20 MED ORDER — DULOXETINE HCL 30 MG PO CPEP
60.0000 mg | ORAL_CAPSULE | Freq: Two times a day (BID) | ORAL | Status: DC
  Administered 2016-04-20 – 2016-05-01 (×21): 60 mg via ORAL
  Filled 2016-04-20 (×22): qty 2

## 2016-04-20 MED ORDER — DOCUSATE SODIUM 100 MG PO CAPS
100.0000 mg | ORAL_CAPSULE | Freq: Two times a day (BID) | ORAL | Status: DC
  Administered 2016-04-20 – 2016-05-01 (×20): 100 mg via ORAL
  Filled 2016-04-20 (×21): qty 1

## 2016-04-20 MED ORDER — LAMOTRIGINE 150 MG PO TABS
300.0000 mg | ORAL_TABLET | Freq: Every day | ORAL | Status: DC
  Administered 2016-04-20 – 2016-04-30 (×11): 300 mg via ORAL
  Filled 2016-04-20 (×12): qty 2

## 2016-04-20 MED ORDER — FAMOTIDINE 20 MG PO TABS
20.0000 mg | ORAL_TABLET | Freq: Every day | ORAL | Status: DC
  Administered 2016-04-21 – 2016-05-01 (×10): 20 mg via ORAL
  Filled 2016-04-20 (×10): qty 1

## 2016-04-20 MED ORDER — MORPHINE SULFATE 15 MG PO TABS
30.0000 mg | ORAL_TABLET | Freq: Four times a day (QID) | ORAL | Status: DC | PRN
  Administered 2016-04-20 – 2016-05-01 (×15): 30 mg via ORAL
  Filled 2016-04-20 (×18): qty 2

## 2016-04-20 MED ORDER — ACETAMINOPHEN 325 MG PO TABS
325.0000 mg | ORAL_TABLET | ORAL | Status: DC | PRN
  Administered 2016-04-21 – 2016-04-23 (×2): 650 mg via ORAL
  Filled 2016-04-20 (×2): qty 2

## 2016-04-20 NOTE — Progress Notes (Signed)
Called gave report to [facility] room 5

## 2016-04-20 NOTE — Progress Notes (Signed)
I met with pt to discuss the possibility of an inpt rehab admission. She is in agreement. I contacted Le[facility] [place], [place] to clarify if wrist surgery clarified yet with Dr. Fredna Dow. I have begun insurance authorization and await approval. [phone]

## 2016-04-20 NOTE — Progress Notes (Signed)
Subjective: No issues overnight.   Objective: Vital signs in last 24 hours: Temp:  [97.8 F (36.6 C)-98.7 F (37.1 C)] 97.8 F (36.6 C) (08/28 0610) Pulse Rate:  [81-118] 84 (08/28 0610) Resp:  [12-17] 16 (08/28 0610) BP: (124-150)/(61-98) 127/65 (08/28 0610) SpO2:  [90 %-100 %] 98 % (08/28 0610)  Physical Exam Constitutional: She appears well-developedand well-nourished. No distress.  Head: Normocephalic. Scattered facial abrasions Eyes: Conjunctivaeare normal. EOMI. No significant entrapment. No significant enophthalmus. Minimal diplopia. ENT: Examination of the ears shows normal auricles and external auditory canals bilaterally. Both tympanic membranes are intact. Ecchymosis to right face, no significant nasal deformity, no nasal septal hematoma, Facial examination shows no asymmetry. Oral cavity examination shows no mucosal lacerations. Trismus resolved. Pulmonary/Chest: Effort normaland breath sounds normal. No respiratory distress.  Neurological: She is alert.  Skin: Skin is warmand dry.   Recent Labs  04/18/16 0357  WBC 4.5  HGB 10.7*  HCT 33.8*  PLT 156    Recent Labs  04/18/16 0357  NA 139  K 3.8  CL 107  CO2 26  GLUCOSE 99  BUN 8  CREATININE 1.07*  CALCIUM 8.9    Medications:  I have reviewed the patient's current medications. Scheduled: . [START ON 04/30/2016] cyanocobalamin  1,000 mcg Intramuscular Q30 days  . docusate sodium  100 mg Oral BID  . DULoxetine  60 mg Oral BID  . famotidine  20 mg Oral Daily  . lamoTRIgine  300 mg Oral QHS  . levothyroxine  125 mcg Oral QAC breakfast  . LORazepam  1 mg Oral QHS  . morphine  30 mg Oral Q12H  . polyethylene glycol  17 g Oral Daily   ZOX:[id number]:ALPRAZolam, bisacodyl, HYDROmorphone (DILAUDID) injection, morphine, ondansetron **OR** ondansetron (ZOFRAN) IV, rOPINIRole  Assessment/Plan: Pt is doing well s/p ORIF of right tripod fractures. Trismus resolved. Diplopia improving. Pt to follow up in my office  in 1 week to removed right temporal sutures.   LOS: 3 days   Daivion Pape,SUI W 04/20/2016, 7:28 AM

## 2016-04-20 NOTE — Interval H&P Note (Signed)
Jillian Morris was admitted today to Inpatient Rehabilitation with the diagnosis of TBI/SAH/right distal radius fracture, right maxillary right zygomatic arch fracture.  The patient's history has been reviewed, patient examined, and there is no change in status.  Patient continues to be appropriate for intensive inpatient rehabilitation.  I have reviewed the patient's chart and labs.  Questions were answered to the patient's satisfaction. The PAPE has been reviewed and assessment remains appropriate.  Ankit Karis Jubanil Patel 04/20/2016, 9:22 PM

## 2016-04-20 NOTE — Progress Notes (Signed)
Standley Brooking, RN Rehab Admission Coordinator Signed Physical Medicine and Rehabilitation  PMR Pre-admission Date of Service: 04/20/2016 5:19 PM  Related encounter: ED to Hosp-Admission (Current) from 04/16/2016 in [facility]       [] Hide copied text PMR Admission Coordinator Pre-Admission Assessment  Patient: Jillian Morris is an 70 y.o., female MRN: [id number] DOB: [birth year] Height: 5\' 2"  (157.5 cm) Weight: 85.7 kg (188 lb 15 oz)                                                                                                                                                                                                                                                                          Insurance Information HMO:     PPO: yes     PCP:      IPA:      80/20:      OTHER: medicare advantage plan PRIMARY: [employer] Medicare      Policy#: [id number]      Subscriber: pt CM Name: Gweneth Dimitri      Phone#: [phone]     Fax#: [phone] Pre-Cert#: [id number]      Employer: retired approved for 7 days. F/u with Velvet Bathe at phone [phone] Fax: EPIC access Benefits:  Phone #: [phone]     Name: 04/20/16 Eff. Date: 12/23/15     Deduct: $325      Out of Pocket Max: $3000      Life Max: none CIR: $230 copay per day days 1-7 then covers 100%      SNF: no copay days 1-20; $146 copay per day days 21-100 Outpatient: 80%     Co-Pay: 20% per medical neccessity Home Health: 100%      Co-Pay: per medical neccessity DME: 80%     Co-Pay: 20% Providers: in network  SECONDARY: none       Medicaid Application Date:       Case Manager:  Disability Application Date:       Case Worker:   Emergency Actuary Information    Name Relation Home Work Mobile   Huskins,Doug Spouse [phone]  [phone]  Current Medical History  Patient Admitting Diagnosis: SAH after fall, right distal radius  fracture  History of Present Illness: Jillian Morris a 70 y.o.right handed femalewith history of fibromyalgia, chronic fatigue syndrome, chronic low back pain. Per chart review patient lives with spouse independently with a cane prior to admission.  Presented 04/16/2016 after ground-level fall in the hallway of her ophthalmologist office. CT of the head showed subarachnoid hemorrhage along the medial right frontal lobe and in the left side of the quadrigeminal plate cistern. Complex fracture involving the lateral inferior walls of the right orbit. Fractures to the anterior posterior superior inferior and lateral walls of the right maxillary sinus. Right zygomatic arch fracture. CT cervical spine negative. Noted also findings of right comminuted dorsally impacted fracture of the distal radius with dorsal angulation of the distal radial articular surface. Dr. Venetia Maxon for follow-up in regards to [facility] advise conservative care. Follow-up cranial CT scan stable. Dr. Merlyn Lot follow-up no recommendations for surgical intervention at this time of right radius fracture, will plan surgery as schedule allows for Dr. Merlyn Lot. Currently with right upper extremity sling. Hospital course pain management.     Past Medical History      Past Medical History:  Diagnosis Date  . Acne rosacea   . Anemia    s/p gastric bypass, malabsorption  . Anxiety   . Arthritis   . B12 deficiency   . Chronic fatigue fibromyalgia syndrome   . Chronic low back pain   . Depression   . Fibromyalgia   . GERD (gastroesophageal reflux disease)   . Graves disease   . H/O hiatal hernia   . Hypothyroidism   . IBS (irritable bowel syndrome)   . Lymphedema   . Meralgia paraesthetica   . Sjogren's syndrome (HCC)   . Status post dilation of esophageal narrowing   . Stroke [facility])     Family History  family history includes Appendicitis in her paternal grandmother; Dementia in her mother; Heart disease in her  father and sister; Irritable bowel syndrome in her sister; Lymphoma in her maternal uncle; Prostate cancer in her maternal grandfather; Stroke in her maternal grandmother.  Prior Rehab/Hospitalizations:  Has the patient had major surgery during 100 days prior to admission? No Previous SN[facility] stay [facility] after knee replacement  Current Medications   Current Facility-Administered Medications:  .  0.9 %  sodium chloride infusion, , Intravenous, Continuous, Jimmye Norman, MD, Last Rate: 50 mL/hr at 04/20/16 0617, 50 mL/hr at 04/20/16 0617 .  ALPRAZolam Prudy Feeler) tablet 0.25 mg, 0.25 mg, Oral, BID PRN, Jimmye Norman, MD .  bisacodyl (DULCOLAX) EC tablet 5 mg, 5 mg, Oral, Daily PRN, De Blanch Kinsinger, MD .  Melene Muller ON 04/30/2016] cyanocobalamin ((VITAMIN B-12)) injection 1,000 mcg, 1,000 mcg, Intramuscular, Q30 days, Jimmye Norman, MD .  docusate sodium (COLACE) capsule 100 mg, 100 mg, Oral, BID, Freeman Caldron, PA-C, 100 mg at 04/20/16 0954 .  DULoxetine (CYMBALTA) DR capsule 60 mg, 60 mg, Oral, BID, Jimmye Norman, MD, 60 mg at 04/20/16 0956 .  famotidine (PEPCID) tablet 20 mg, 20 mg, Oral, Daily, Jimmye Norman, MD, 20 mg at 04/20/16 0957 .  HYDROmorphone (DILAUDID) injection 1-2 mg, 1-2 mg, Intravenous, Q2H PRN, Jimmye Norman, MD, 2 mg at 04/20/16 1619 .  lamoTRIgine (LAMICTAL) tablet 300 mg, 300 mg, Oral, QHS, Jimmye Norman, MD, 300 mg at 04/19/16 2109 .  levothyroxine (SYNTHROID, LEVOTHROID) tablet 125 mcg, 125 mcg, Oral, QAC breakfast, Jimmye Norman, MD, 125 mcg at 04/20/16 0754 .  LORazepam (ATIVAN) tablet 1 mg, 1 mg, Oral, QHS, Jimmye NormanJames Wyatt, MD, 1 mg at 04/19/16 2109 .  morphine (MS CONTIN) 12 hr tablet 30 mg, 30 mg, Oral, Q12H, Jimmye NormanJames Wyatt, MD, 30 mg at 04/20/16 0956 .  morphine (MSIR) tablet 30 mg, 30 mg, Oral, Q6H PRN, Jimmye NormanJames Wyatt, MD, 30 mg at 04/19/16 1349 .  ondansetron (ZOFRAN) tablet 4 mg, 4 mg, Oral, Q6H PRN **OR** ondansetron (ZOFRAN) injection 4 mg, 4 mg, Intravenous, Q6H PRN, Jimmye NormanJames Wyatt, MD, 4  mg at 04/16/16 2100 .  polyethylene glycol (MIRALAX / GLYCOLAX) packet 17 g, 17 g, Oral, Daily, Freeman CaldronMichael J Jeffery, PA-C, 17 g at 04/20/16 [id number] .  rOPINIRole (REQUIP) tablet 1 mg, 1 mg, Oral, QHS PRN, Jimmye NormanJames Wyatt, MD  Patients Current Diet: DIET SOFT Room service appropriate? Yes; Fluid consistency: Thin  Precautions / Restrictions Precautions Precautions: Fall Restrictions Weight Bearing Restrictions: Yes RUE Weight Bearing: Non weight bearing Other Position/Activity Restrictions: RUE sling   Has the patient had 2 or more falls or a fall with injury in the past year?No  Prior Activity Level Community (5-7x/wk): Independent and driving pta   Journalist, newspaperHome Assistive Devices / Equipment Home Assistive Devices/Equipment: Cane (specify quad or straight) Home Equipment: Cane - single point  Prior Device Use: Indicate devices/aids used by the patient prior to current illness, exacerbation or injury? cane  Prior Functional Level Prior Function Level of Independence: Independent with assistive device(s) Comments: SPC for mobility   Self Care: Did the patient need help bathing, dressing, using the toilet or eating?  Independent  Indoor Mobility: Did the patient need assistance with walking from room to room (with or without device)? Independent  Stairs: Did the patient need assistance with internal or external stairs (with or without device)? Independent  Functional Cognition: Did the patient need help planning regular tasks such as shopping or remembering to take medications? Independent  Current Functional Level Cognition Arousal/Alertness: Awake/alert Overall Cognitive Status: Within Functional Limits for tasks assessed Current Attention Level: Selective Orientation Level: Oriented X4 Following Commands: Follows one step commands with increased time Safety/Judgement: Decreased awareness of safety, Decreased awareness of deficits General Comments: Pt able to answer questions  and respond appropriately during session. Attention: Sustained Sustained Attention: Impaired Sustained Attention Impairment: Verbal basic Memory: Impaired Memory Impairment: Decreased recall of new information, Retrieval deficit Awareness: Impaired Awareness Impairment: Intellectual impairment, Emergent impairment, Anticipatory impairment Problem Solving: Impaired Problem Solving Impairment: Verbal basic Safety/Judgment: Impaired    Extremity Assessment (includes Sensation/Coordination) Upper Extremity Assessment: RUE deficits/detail RUE Deficits / Details: Increased edema noted in digits. Pt reports sensation intact. Shoulder AROM WFL.  RUE: Unable to fully assess due to immobilization  Lower Extremity Assessment: Defer to PT evaluation   ADLs Overall ADL's : Needs assistance/impaired Eating/Feeding: Set up, Sitting Grooming: Minimal assistance, Sitting Grooming Details (indicate cue type and reason): Pt able to wash face sitting  Upper Body Bathing: Minimal assitance, Sitting Lower Body Bathing: Minimal assistance, Sit to/from stand Upper Body Dressing : Moderate assistance, Sitting Upper Body Dressing Details (indicate cue type and reason): don/doff of sling Lower Body Dressing: Minimal assistance, Sit to/from stand Lower Body Dressing Details (indicate cue type and reason): Min assist to don socks sitting EOB. Pt able to cross foot over opposite knee to assist with donning socks. Toilet Transfer: Minimal assistance, Ambulation, Comfort height toilet Toilet Transfer Details (indicate cue type and reason): simulated with chair transfer Toileting- Clothing Manipulation and Hygiene: Minimal assistance, Sit to/from stand Functional mobility during ADLs: Minimal  assistance (hand held assist) General ADL Comments: Pt unsteady on feet during ambulation to recliner in room., requiring hand held assist.  Pt and husband educated on wearing sling, positioning of sling, and practiced don/doff    Mobility Overal bed mobility: Needs Assistance Bed Mobility: Supine to Sit Supine to sit: Min guard General bed mobility comments: min guard for safety   Transfers Overall transfer level: Needs assistance Equipment used: 1 person hand held assist Transfers: Sit to/from Stand Sit to Stand: Min assist General transfer comment: min assist for balance and stability when elevating to upright   Ambulation / Gait / Stairs / Wheelchair Mobility Ambulation/Gait Ambulation/Gait assistance: Mod assist Ambulation Distance (Feet): 90 Feet Assistive device: 1 person hand held assist Gait Pattern/deviations: Step-through pattern, Decreased stride length, Shuffle, Narrow base of support General Gait Details: patient very unsteady with ambulation, also required assist for safety due to visual deficits and balance deficits related to LE weakness and limited RUE altering overall gait Gait velocity: decreased Gait velocity interpretation: Below normal speed for age/gender   Posture / Balance Balance Overall balance assessment: Needs assistance Sitting-balance support: No upper extremity supported, Feet unsupported Sitting balance-Leahy Scale: Good Standing balance support: Single extremity supported Standing balance-Leahy Scale: Poor Standing balance comment: Requires single UE support for balance in standing   Special needs/care consideration  Skin R arm in splint and wrapped with ace wrap. r facial bruising and incision to right cheek Bowel mgmt: continent Bladder mgmt: continent Dr. Merlyn Lot to contact Rehab PA to schedule surgery for right wrist which may occur end of this week or next week. NWB RUE for 6 weeks   Previous Home Environment Living Arrangements: Spouse/significant other  Lives With: Spouse Available Help at Discharge: Family Type of Home: House Home Layout: One level Home Access: Level entry Bathroom Shower/Tub: Tub/shower unit, Engineer, building services: Handicapped  height Bathroom Accessibility: Yes How Accessible: Accessible via walker Home Care Services: No  Discharge Living Setting Plans for Discharge Living Setting: Patient's home, Lives with (comment) (spouse) Type of Home at Discharge: House Discharge Home Layout: One level Discharge Home Access: Level entry Discharge Bathroom Shower/Tub: Tub/shower unit, Curtain Discharge Bathroom Toilet: Handicapped height Discharge Bathroom Accessibility: Yes How Accessible: Accessible via walker Does the patient have any problems obtaining your medications?: No  Social/Family/Support Systems Patient Roles: Spouse, Parent Contact Information: Gala Romney, spouse "Sparky" Anticipated Caregiver: spouse Anticipated Caregiver's Contact Information: see above Ability/Limitations of Caregiver: no limitations Caregiver Availability: 24/7 Discharge Plan Discussed with Primary Caregiver: Yes Is Caregiver In Agreement with Plan?: Yes Does Caregiver/Family have Issues with Lodging/Transportation while Pt is in Rehab?: No  Goals/Additional Needs Patient/Family Goal for Rehab: Mod I PT, Mod I to supervision with OT, Mod I to supervision with SLP Expected length of stay: ELOS 7-12 days Pt/Family Agrees to Admission and willing to participate: Yes Program Orientation Provided & Reviewed with Pt/Caregiver Including Roles  & Responsibilities: Yes  Decrease burden of Care through IP rehab admission: n/a  Possible need for SNF placement upon discharge:not anticipated  Patient Condition: This patient's medical and functional status has changed since the consult dated: 04/17/2016 in which the Rehabilitation Physician determined and documented that the patient's condition is appropriate for intensive rehabilitative care in an inpatient rehabilitation facility. See "History of Present Illness" (above) for medical update. Functional changes are: min to mod assist. Patient's medical and functional status update has been  discussed with the Rehabilitation physician and patient remains appropriate for inpatient rehabilitation. Will admit to inpatient rehab today.  Preadmission Screen Completed By:  Clois Dupes, 04/20/2016 5:32 PM ______________________________________________________________________   Discussed status with Dr. Allena Katz on 04/20/2016 at  1732 and received telephone approval for admission today.  Admission Coordinator:  Clois Dupes, time 1610 Date 04/20/2016.       Cosigned by: Ankit Karis Juba, MD at 04/20/2016 5:36 PM  Revision History

## 2016-04-20 NOTE — Discharge Summary (Signed)
Physician Discharge Summary  Patient ID: Jillian Morris MRN: [id number] DOB/AGE: 09/24/1945 70 y.o.  Admit date: 04/16/2016 Discharge date: 04/20/2016  Discharge Diagnoses Patient Active Problem List   Diagnosis Date Noted  . Fall 04/20/2016  . Multiple facial fractures (HCC) 04/20/2016  . Right wrist fracture 04/20/2016  . Traumatic subarachnoid hemorrhage (HCC) 04/16/2016  . Achalasia 08/09/2013  . Stricture and stenosis of esophagus 03/30/2012  . Dysphagia, unspecified(787.20) 03/21/2012  . Special screening for malignant neoplasms, colon 03/21/2012  . Esophageal reflux 03/21/2012    Consultants Dr. Betha Loa for hand surgery  Dr. Maeola Harman for neurosurgery  Dr. Newman Pies for ENT  Dr. Faith Rogue for PM&R  Dr. Maris Berger for ophthalmology   Procedures 8/27 -- Open reduction internal fixation of right depressed tripod/zygomatic arch fractures with multiple approaches by Dr. Suszanne Conners   HPI: Jillian Morris suffered a ground level fall in the hallway of her ophthalmologist's office. There was a brief loss of consciousness of a few minutes. Injuries noted are right distal radius fracture assessed by Dr. Merlyn Lot, subarachnoid hemorrhage assessed by Dr. Venetia Maxon, and multiple facial fractures. She was admitted by the trauma service and ENT was consulted to round out the team.   Hospital Course: Hand surgery decided to wait on a decision on surgery until they could see her as an outpatient. It appeared that would be within a week but when it was clear she would be here longer they were contacted again still recommended outpatient follow-up when she left the hospital. Neurosurgery recommended non-operative treatment and a follow-up head CT the next day was improved. ENT recommended operative fixation and this was performed once her swelling had had a chance to improve. He also requested ophthalmology consultation and this was obtained with recommendations to follow up with her personal  ophthalmologist as an outpatient. She was mobilized with physical and occupational therapies who recommended inpatient rehabilitation. They were consulted and agreed and she was discharged there in good condition.   Inpatient Medications Scheduled Meds: . [START ON 04/30/2016] cyanocobalamin  1,000 mcg Intramuscular Q30 days  . docusate sodium  100 mg Oral BID  . DULoxetine  60 mg Oral BID  . famotidine  20 mg Oral Daily  . lamoTRIgine  300 mg Oral QHS  . levothyroxine  125 mcg Oral QAC breakfast  . LORazepam  1 mg Oral QHS  . morphine  30 mg Oral Q12H  . polyethylene glycol  17 g Oral Daily   Continuous Infusions: . sodium chloride 50 mL/hr (04/20/16 0617)   PRN Meds:.ALPRAZolam, bisacodyl, HYDROmorphone (DILAUDID) injection, morphine, ondansetron **OR** ondansetron (ZOFRAN) IV, rOPINIRole   Home Medications Current Meds  Medication Sig  . ALPRAZolam (XANAX) 0.25 MG tablet Take 0.25 mg by mouth 2 (two) times daily as needed (for panic attacks).   . cyanocobalamin (,VITAMIN B-12,) 1000 MCG/ML injection Inject 1,000 mcg into the muscle every 30 (thirty) days.   . DULoxetine (CYMBALTA) 60 MG capsule Take 60 mg by mouth 2 (two) times daily.  Marland Kitchen lamoTRIgine (LAMICTAL) 100 MG tablet Take 300 mg by mouth at bedtime.  . lidocaine (LIDODERM) 5 % Place 1 patch onto the skin daily. Remove & Discard patch within 12 hours or as directed by MD  . LORazepam (ATIVAN) 1 MG tablet Take 1 mg by mouth at bedtime.   . metroNIDAZOLE (METROCREAM) 0.75 % cream Apply 1 application topically 2 (two) times daily.   Marland Kitchen morphine (MS CONTIN) 30 MG 12 hr tablet Take 30 mg by mouth  every 12 (twelve) hours.   . mupirocin cream (BACTROBAN) 2 % 1 application.  Marland Kitchen. oxymorphone (OPANA) 10 MG tablet Take 10 mg by mouth every 6 (six) hours as needed for pain.  . ranitidine (ZANTAC) 300 MG tablet Take 300 mg by mouth at bedtime.   Marland Kitchen. rOPINIRole (REQUIP) 1 MG tablet 1 TABLET (1 mg) BEFORE BEDTIME as needed for restless leg  syndrome  . SYNTHROID 125 MCG tablet TAKE 1 TABLET (125 mcg) BY MOUTH EVERY MORNING ON EMPTY STOMACH ONCE DAILY  . [DISCONTINUED] oxymorphone (OPANA ER) 10 MG 12 hr tablet Take 10 mg by mouth 4 (four) times daily as needed for pain.    Follow-up Information    Tami RibasKUZMA,KEVIN R, MD. Schedule an appointment as soon as possible for a visit today.   Specialty:  Orthopedic Surgery Contact information: [address] [place] [address] [phone]        Darletta MollEOH,SUI W, MD. Schedule an appointment as soon as possible for a visit today.   Specialty:  Otolaryngology Contact information: [address] [place] [address] [phone]        Dorian HeckleSTERN,JOSEPH D, MD. Schedule an appointment as soon as possible for a visit today.   Specialty:  Neurosurgery Contact information: [address] [place] [address] [phone]        Chucky MayIGBY,DONALD J, MD. Schedule an appointment as soon as possible for a visit today.   Specialty:  Ophthalmology Contact information: [address] [place] [address] [phone]        [facility] .   Why:  Call as needed Contact information: [address] [id number] [place] [id number] [phone]           Signed: Freeman CaldronMichael J. Kynzlie Hilleary, PA-C Pager: [phone] General Trauma PA Pager: [phone] 04/20/2016, 3:22 PM

## 2016-04-20 NOTE — PMR Pre-admission (Signed)
PMR Admission Coordinator Pre-Admission Assessment  Patient: Jillian Morris is an 70 y.o., female MRN: [id number] DOB: Aug 03, 1946 Height: 5\' 2"  (157.5 cm) Weight: 85.7 kg (188 lb 15 oz)              Insurance Information HMO:     PPO: yes     PCP:      IPA:      80/20:      OTHER: medicare advantage plan PRIMARY: [employer] Medicare      Policy#: [id number]      Subscriber: pt CM Name: Gweneth Dimitri      Phone#: [phone]     Fax#: [phone] Pre-Cert#: [id number]      Employer: retired approved for 7 days. F/u with Velvet Bathe at phone [phone] Fax: EPIC access Benefits:  Phone #: [phone]     Name: 04/20/16 Eff. Date: 12/23/15     Deduct: $325      Out of Pocket Max: $3000      Life Max: none CIR: $230 copay per day days 1-7 then covers 100%      SNF: no copay days 1-20; $146 copay per day days 21-100 Outpatient: 80%     Co-Pay: 20% per medical neccessity Home Health: 100%      Co-Pay: per medical neccessity DME: 80%     Co-Pay: 20% Providers: in network  SECONDARY: none       Medicaid Application Date:       Case Manager:  Disability Application Date:       Case Worker:   Emergency Conservator, museum/gallery Information    Name Relation Home Work Mobile   Jillian Morris Spouse [phone]  [phone]     Current Medical History  Patient Admitting Diagnosis: SAH after fall, right distal radius fracture  History of Present Illness: Jillian Morris a 70 y.o.right handed femalewith history of fibromyalgia, chronic fatigue syndrome, chronic low back pain. Per chart review patient lives with spouse independently with a cane prior to admission.  Presented 04/16/2016 after ground-level fall in the hallway of her ophthalmologist office. CT of the head showed subarachnoid hemorrhage along the medial right frontal lobe and in the left side of the quadrigeminal plate cistern. Complex fracture involving the lateral inferior walls of the right orbit. Fractures to the  anterior posterior superior inferior and lateral walls of the right maxillary sinus. Right zygomatic arch fracture. CT cervical spine negative. Noted also findings of right comminuted dorsally impacted fracture of the distal radius with dorsal angulation of the distal radial articular surface. Dr. Venetia Maxon for follow-up in regards to [facility] advise conservative care. Follow-up cranial CT scan stable. Dr. Merlyn Lot follow-up no recommendations for surgical intervention at this time of right radius fracture, will plan surgery as schedule allows for Dr. Merlyn Lot. Currently with right upper extremity sling. Hospital course pain management.     Past Medical History  Past Medical History:  Diagnosis Date  . Acne rosacea   . Anemia    s/p gastric bypass, malabsorption  . Anxiety   . Arthritis   . B12 deficiency   . Chronic fatigue fibromyalgia syndrome   . Chronic low back pain   . Depression   . Fibromyalgia   . GERD (gastroesophageal reflux disease)   . Graves disease   . H/O hiatal hernia   . Hypothyroidism   . IBS (irritable bowel syndrome)   . Lymphedema   . Meralgia paraesthetica   . Sjogren's syndrome (HCC)   . Status post  dilation of esophageal narrowing   . Stroke [facility](HCC)     Family History  family history includes Appendicitis in her paternal grandmother; Dementia in her mother; Heart disease in her father and sister; Irritable bowel syndrome in her sister; Lymphoma in her maternal uncle; Prostate cancer in her maternal grandfather; Stroke in her maternal grandmother.  Prior Rehab/Hospitalizations:  Has the patient had major surgery during 100 days prior to admission? No [facility]  Current Medications   Current Facility-Administered Medications:  .  0.9 %  sodium chloride infusion, , Intravenous, Continuous, Jimmye NormanJames Wyatt, MD, Last Rate: 50 mL/hr at 04/20/16 0617, 50 mL/hr at 04/20/16 0617 .  ALPRAZolam Prudy Feeler(XANAX) tablet 0.25 mg, 0.25 mg, Oral, BID PRN,  Jimmye NormanJames Wyatt, MD .  bisacodyl (DULCOLAX) EC tablet 5 mg, 5 mg, Oral, Daily PRN, De BlanchLuke Aaron Kinsinger, MD .  Melene Muller[START ON 04/30/2016] cyanocobalamin ((VITAMIN B-12)) injection 1,000 mcg, 1,000 mcg, Intramuscular, Q30 days, Jimmye NormanJames Wyatt, MD .  docusate sodium (COLACE) capsule 100 mg, 100 mg, Oral, BID, Freeman CaldronMichael J Jeffery, PA-C, 100 mg at 04/20/16 0954 .  DULoxetine (CYMBALTA) DR capsule 60 mg, 60 mg, Oral, BID, Jimmye NormanJames Wyatt, MD, 60 mg at 04/20/16 0956 .  famotidine (PEPCID) tablet 20 mg, 20 mg, Oral, Daily, Jimmye NormanJames Wyatt, MD, 20 mg at 04/20/16 0957 .  HYDROmorphone (DILAUDID) injection 1-2 mg, 1-2 mg, Intravenous, Q2H PRN, Jimmye NormanJames Wyatt, MD, 2 mg at 04/20/16 1619 .  lamoTRIgine (LAMICTAL) tablet 300 mg, 300 mg, Oral, QHS, Jimmye NormanJames Wyatt, MD, 300 mg at 04/19/16 2109 .  levothyroxine (SYNTHROID, LEVOTHROID) tablet 125 mcg, 125 mcg, Oral, QAC breakfast, Jimmye NormanJames Wyatt, MD, 125 mcg at 04/20/16 0754 .  LORazepam (ATIVAN) tablet 1 mg, 1 mg, Oral, QHS, Jimmye NormanJames Wyatt, MD, 1 mg at 04/19/16 2109 .  morphine (MS CONTIN) 12 hr tablet 30 mg, 30 mg, Oral, Q12H, Jimmye NormanJames Wyatt, MD, 30 mg at 04/20/16 0956 .  morphine (MSIR) tablet 30 mg, 30 mg, Oral, Q6H PRN, Jimmye NormanJames Wyatt, MD, 30 mg at 04/19/16 1349 .  ondansetron (ZOFRAN) tablet 4 mg, 4 mg, Oral, Q6H PRN **OR** ondansetron (ZOFRAN) injection 4 mg, 4 mg, Intravenous, Q6H PRN, Jimmye NormanJames Wyatt, MD, 4 mg at 04/16/16 2100 .  polyethylene glycol (MIRALAX / GLYCOLAX) packet 17 g, 17 g, Oral, Daily, Freeman CaldronMichael J Jeffery, PA-C, 17 g at 04/20/16 [id number] .  rOPINIRole (REQUIP) tablet 1 mg, 1 mg, Oral, QHS PRN, Jimmye NormanJames Wyatt, MD  Patients Current Diet: DIET SOFT Room service appropriate? Yes; Fluid consistency: Thin  Precautions / Restrictions Precautions Precautions: Fall Restrictions Weight Bearing Restrictions: Yes RUE Weight Bearing: Non weight bearing Other Position/Activity Restrictions: RUE sling   Has the patient had 2 or more falls or a fall with injury in the past year?No  Prior Activity  Level Community (5-7x/wk): Independent and driving pta   Journalist, newspaperHome Assistive Devices / Equipment Home Assistive Devices/Equipment: Cane (specify quad or straight) Home Equipment: Cane - single point  Prior Device Use: Indicate devices/aids used by the patient prior to current illness, exacerbation or injury? cane  Prior Functional Level Prior Function Level of Independence: Independent with assistive device(s) Comments: SPC for mobility   Self Care: Did the patient need help bathing, dressing, using the toilet or eating?  Independent  Indoor Mobility: Did the patient need assistance with walking from room to room (with or without device)? Independent  Stairs: Did the patient need assistance with internal or external stairs (with or without device)? Independent  Functional Cognition: Did the  patient need help planning regular tasks such as shopping or remembering to take medications? Independent  Current Functional Level Cognition  Arousal/Alertness: Awake/alert Overall Cognitive Status: Within Functional Limits for tasks assessed Current Attention Level: Selective Orientation Level: Oriented X4 Following Commands: Follows one step commands with increased time Safety/Judgement: Decreased awareness of safety, Decreased awareness of deficits General Comments: Pt able to answer questions and respond appropriately during session. Attention: Sustained Sustained Attention: Impaired Sustained Attention Impairment: Verbal basic Memory: Impaired Memory Impairment: Decreased recall of new information, Retrieval deficit Awareness: Impaired Awareness Impairment: Intellectual impairment, Emergent impairment, Anticipatory impairment Problem Solving: Impaired Problem Solving Impairment: Verbal basic Safety/Judgment: Impaired    Extremity Assessment (includes Sensation/Coordination)  Upper Extremity Assessment: RUE deficits/detail RUE Deficits / Details: Increased edema noted in digits. Pt  reports sensation intact. Shoulder AROM WFL.  RUE: Unable to fully assess due to immobilization  Lower Extremity Assessment: Defer to PT evaluation    ADLs  Overall ADL's : Needs assistance/impaired Eating/Feeding: Set up, Sitting Grooming: Minimal assistance, Sitting Grooming Details (indicate cue type and reason): Pt able to wash face sitting  Upper Body Bathing: Minimal assitance, Sitting Lower Body Bathing: Minimal assistance, Sit to/from stand Upper Body Dressing : Moderate assistance, Sitting Upper Body Dressing Details (indicate cue type and reason): don/doff of sling Lower Body Dressing: Minimal assistance, Sit to/from stand Lower Body Dressing Details (indicate cue type and reason): Min assist to don socks sitting EOB. Pt able to cross foot over opposite knee to assist with donning socks. Toilet Transfer: Minimal assistance, Ambulation, Comfort height toilet Toilet Transfer Details (indicate cue type and reason): simulated with chair transfer Toileting- Clothing Manipulation and Hygiene: Minimal assistance, Sit to/from stand Functional mobility during ADLs: Minimal assistance (hand held assist) General ADL Comments: Pt unsteady on feet during ambulation to recliner in room., requiring hand held assist.  Pt and husband educated on wearing sling, positioning of sling, and practiced don/doff    Mobility  Overal bed mobility: Needs Assistance Bed Mobility: Supine to Sit Supine to sit: Min guard General bed mobility comments: min guard for safety    Transfers  Overall transfer level: Needs assistance Equipment used: 1 person hand held assist Transfers: Sit to/from Stand Sit to Stand: Min assist General transfer comment: min assist for balance and stability when elevating to upright    Ambulation / Gait / Stairs / Wheelchair Mobility  Ambulation/Gait Ambulation/Gait assistance: Mod assist Ambulation Distance (Feet): 90 Feet Assistive device: 1 person hand held assist Gait  Pattern/deviations: Step-through pattern, Decreased stride length, Shuffle, Narrow base of support General Gait Details: patient very unsteady with ambulation, also required assist for safety due to visual deficits and balance deficits related to LE weakness and limited RUE altering overall gait Gait velocity: decreased Gait velocity interpretation: Below normal speed for age/gender    Posture / Balance Balance Overall balance assessment: Needs assistance Sitting-balance support: No upper extremity supported, Feet unsupported Sitting balance-Leahy Scale: Good Standing balance support: Single extremity supported Standing balance-Leahy Scale: Poor Standing balance comment: Requires single UE support for balance in standing    Special needs/care consideration  Skin R arm in splint and wrapped with ace wrap. r facial bruising and incision to right cheek Bowel mgmt: [facility] Bladder mgmt: [facility] Dr. Merlyn Lot to contact Rehab PA to schedule surgery for right wrist which may occur end of this week or next week. NWB RUE for 6 weeks   Previous Home Environment Living Arrangements: Spouse/significant other  Lives With: Spouse Available Help at Discharge:  Family Type of Home: House Home Layout: One level Home Access: Level entry Bathroom Shower/Tub: Tub/shower unit, Engineer, building services: Handicapped height Bathroom Accessibility: Yes How Accessible: Accessible via walker Home Care Services: No  Discharge Living Setting Plans for Discharge Living Setting: Patient's home, Lives with (comment) (spouse) Type of Home at Discharge: House Discharge Home Layout: One level Discharge Home Access: Level entry Discharge Bathroom Shower/Tub: Tub/shower unit, Curtain Discharge Bathroom Toilet: Handicapped height Discharge Bathroom Accessibility: Yes How Accessible: Accessible via walker Does the patient have any problems obtaining your medications?: No  Social/Family/Support Systems Patient  Roles: Spouse, Parent Contact Information: Gala Romney, spouse "Sparky" Anticipated Caregiver: spouse Anticipated Caregiver's Contact Information: see above Ability/Limitations of Caregiver: no limitations Caregiver Availability: 24/7 Discharge Plan Discussed with Primary Caregiver: Yes Is Caregiver In Agreement with Plan?: Yes Does Caregiver/Family have Issues with Lodging/Transportation while Pt is in Rehab?: No  Goals/Additional Needs Patient/Family Goal for Rehab: Mod I PT, Mod I to supervision with OT, Mod I to supervision with SLP Expected length of stay: ELOS 7-12 days Pt/Family Agrees to Admission and willing to participate: Yes Program Orientation Provided & Reviewed with Pt/Caregiver Including Roles  & Responsibilities: Yes  Decrease burden of Care through IP rehab admission: n/a  Possible need for SNF placement upon discharge:not anticipated  Patient Condition: This patient's medical and functional status has changed since the consult dated: 04/17/2016 in which the Rehabilitation Physician determined and documented that the patient's condition is appropriate for intensive rehabilitative care in an inpatient rehabilitation facility. See "History of Present Illness" (above) for medical update. Functional changes are: min to mod assist. Patient's medical and functional status update has been discussed with the Rehabilitation physician and patient remains appropriate for inpatient rehabilitation. Will admit to inpatient rehab today.   Preadmission Screen Completed By:  Clois Dupes, 04/20/2016 5:32 PM ______________________________________________________________________   Discussed status with Dr. Allena Katz on 04/20/2016 at  1732 and received telephone approval for admission today.  Admission Coordinator:  Clois Dupes, time 5409 Date 04/20/2016.

## 2016-04-20 NOTE — Progress Notes (Signed)
Patient was admitted to 224M05 with husband and belongings at side.  Vitals stable. Rehab process gone over with patient, questions answered, safety sheet signed. No further questions at this time.  Will continue to monitor.

## 2016-04-20 NOTE — H&P (Signed)
  Physical Medicine and Rehabilitation Admission H&P    Chief Complaint  Patient presents with  . Fall  : HPI: Jillian Morris is a 70 y.o. right handed female with history of fibromyalgia, chronic fatigue syndrome, chronic low back pain. Per chart review patient lives with spouse independently with a cane prior to admission. One level home. Presented 04/16/2016 after ground-level fall in the hallway of her ophthalmologist office. CT of the head showed subarachnoid hemorrhage along the medial right frontal lobe and in the left side of the quadrigeminal plate cistern. Complex fracture involving the lateral inferior walls of the right orbit. Fractures to the anterior posterior superior inferior and lateral walls of the right maxillary sinus. Right zygomatic arch fracture. CT cervical spine negative. Noted also findings of right comminuted dorsally impacted fracture of the distal radius with dorsal angulation of the distal radial articular surface. Dr. Stern for follow-up in regards to SAH advise conservative care. Follow-up cranial CT scan stable. Dr. Kuzma follow-up no recommendations for surgical intervention at this time of right radius fracture, will plan as outpatient. Currently with right upper extremity sling. Hospital course pain management. Physical occupational therapy evaluations completed with recommendations of physical medicine rehabilitation consult.Patient was admitted for a comprehensive rehabilitation program  ROS Constitutional: Positive for malaise/fatigue. Negative for chills and fever.  Eyes: Negative for blurred vision and double vision.  Respiratory: Negative for cough and shortness of breath.   Cardiovascular: Positive for leg swelling. Negative for chest pain and palpitations.  Gastrointestinal: Positive for constipation. Negative for nausea and vomiting.       GERD  Genitourinary: Negative for dysuria and hematuria.  Musculoskeletal: Positive for back pain and falls.    Skin: Negative for rash.  Neurological: Positive for headaches. Negative for seizures.  Psychiatric/Behavioral: Positive for depression.       Anxiety  All other systems reviewed and are negative   Past Medical History:  Diagnosis Date  . Acne rosacea   . Anemia    s/p gastric bypass, malabsorption  . Anxiety   . Arthritis   . B12 deficiency   . Chronic fatigue fibromyalgia syndrome   . Chronic low back pain   . Depression   . Fibromyalgia   . GERD (gastroesophageal reflux disease)   . Graves disease   . H/O hiatal hernia   . Hypothyroidism   . IBS (irritable bowel syndrome)   . Lymphedema   . Meralgia paraesthetica   . Sjogren's syndrome (HCC)   . Status post dilation of esophageal narrowing   . Stroke (HCC)    Past Surgical History:  Procedure Laterality Date  . ABDOMINAL HYSTERECTOMY    . APPENDECTOMY    . BALLOON DILATION  03/30/2012   Procedure: BALLOON DILATION;  Surgeon: Robert D Kaplan, MD;  Location: [facility];  Service: Endoscopy;  Laterality: N/A;  . BOTOX INJECTION N/A 12/26/2014   Procedure: BOTOX INJECTION;  Surgeon: Robert D Kaplan, MD;  Location: [facility];  Service: Endoscopy;  Laterality: N/A;  . ENDOMETRIAL ABLATION     x 4  . ESOPHAGEAL MANOMETRY N/A 05/15/2013   Procedure: ESOPHAGEAL MANOMETRY (EM);  Surgeon: Robert D Kaplan, MD;  Location: [facility];  Service: Endoscopy;  Laterality: N/A;  . ESOPHAGOGASTRODUODENOSCOPY  03/30/2012   Procedure: ESOPHAGOGASTRODUODENOSCOPY (EGD);  Surgeon: Robert D Kaplan, MD;  Location: [facility];  Service: Endoscopy;  Laterality: N/A;  . ESOPHAGOGASTRODUODENOSCOPY  05/04/2012   Procedure: ESOPHAGOGASTRODUODENOSCOPY (EGD);  Surgeon: Robert D Kaplan, MD;  Location: [facility];    Service: Endoscopy;  Laterality: N/A;  . ESOPHAGOGASTRODUODENOSCOPY N/A 12/26/2014   Procedure: ESOPHAGOGASTRODUODENOSCOPY (EGD);  Surgeon: Robert D Kaplan, MD;  Location: [facility];  Service: Endoscopy;  Laterality: N/A;  botox  injection  . GASTRIC BYPASS    . LAMINECTOMY     L3-L5  . ORIF TRIPOD FRACTURE Right 04/19/2016   Procedure: OPEN REDUCTION INTERNAL FIXATION (ORIF) TRIPOD FRACTURE LIEBINGER MIDFACE NO DRILL;  Surgeon: Su Teoh, MD;  Location: [facility];  Service: ENT;  Laterality: Right;  . 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 THYROIDECTOMY     Family History  Problem Relation Age of Onset  . Prostate cancer Maternal Grandfather   . Lymphoma Maternal Uncle   . Heart disease Father   . Heart disease Sister   . Irritable bowel syndrome Sister   . Dementia Mother   . Stroke Maternal Grandmother   . Appendicitis Paternal Grandmother   . Colon cancer Neg Hx    Social History:  reports that she has never smoked. She has never used smokeless tobacco. She reports that she does not drink alcohol or use drugs. Allergies:  Allergies  Allergen Reactions  . Actonel [Risedronate Sodium]     GI Upset  . Ambien [Zolpidem Tartrate] Other (See Comments)    Went crazy, hallucinations  . Augmentin [Amoxicillin-Pot Clavulanate] Other (See Comments)    GI upset  . Depakote [Divalproex Sodium]     Hallucinations  . Inderal [Propranolol] Other (See Comments)    unknown  . Nsaids Other (See Comments)    Pt. Had gastric bypass surgery   . Penicillins Other (See Comments)    Unknown childhood reaction  . Silicon Nausea And Vomiting    Thyroid storm  . Adhesive [Tape] Itching and Rash    Please use "paper" tape  . Latex Swelling and Rash    Redness lips  . Nickel Itching and Rash    Other reaction(s): Contact Dermatitis (intolerance)   Medications Prior to Admission  Medication Sig Dispense Refill  . ALPRAZolam (XANAX) 0.25 MG tablet Take 0.25 mg by mouth 2 (two) times daily as needed (for panic attacks).     . cyanocobalamin (,VITAMIN B-12,) 1000 MCG/ML injection Inject 1,000 mcg into the muscle every 30 (thirty) days.     . DULoxetine (CYMBALTA) 60 MG capsule Take 60 mg by mouth 2 (two) times daily.    .  lamoTRIgine (LAMICTAL) 100 MG tablet Take 300 mg by mouth at bedtime.    . lidocaine (LIDODERM) 5 % Place 1 patch onto the skin daily. Remove & Discard patch within 12 hours or as directed by MD    . LORazepam (ATIVAN) 1 MG tablet Take 1 mg by mouth at bedtime.     . metroNIDAZOLE (METROCREAM) 0.75 % cream Apply 1 application topically 2 (two) times daily.     . morphine (MS CONTIN) 30 MG 12 hr tablet Take 30 mg by mouth every 12 (twelve) hours.     . mupirocin cream (BACTROBAN) 2 % 1 application.    . oxymorphone (OPANA) 10 MG tablet Take 10 mg by mouth every 6 (six) hours as needed for pain.    . ranitidine (ZANTAC) 300 MG tablet Take 300 mg by mouth at bedtime.     . rOPINIRole (REQUIP) 1 MG tablet 1 TABLET (1 mg) BEFORE BEDTIME as needed for restless leg syndrome  6  . SYNTHROID 125 MCG tablet TAKE 1 TABLET (125 mcg) BY MOUTH EVERY MORNING ON EMPTY STOMACH   ONCE DAILY  3  . dexlansoprazole (DEXILANT) 60 MG capsule TAKE 1 CAPSULE (60 MG TOTAL) BY MOUTH DAILY. (Patient not taking: Reported on 04/16/2016) 90 capsule 3    Home: Home Living Family/patient expects to be discharged to:: Private residence Living Arrangements: Spouse/significant other Available Help at Discharge: Family Type of Home: House Home Access: Level entry Home Layout: One level Bathroom Shower/Tub:  (sponge bathes) Bathroom Toilet: Handicapped height (one standard) Home Equipment: Cane - single point  Lives With: Spouse   Functional History: Prior Function Level of Independence: Independent with assistive device(s) Comments: SPC for mobility   Functional Status:  Mobility: Bed Mobility Overal bed mobility: Needs Assistance Bed Mobility: Supine to Sit Supine to sit: Min guard General bed mobility comments: min guard for safety Transfers Overall transfer level: Needs assistance Equipment used: 1 person hand held assist Transfers: Sit to/from Stand Sit to Stand: Min assist General transfer comment: min  assist for balance and stability when elevating to upright Ambulation/Gait Ambulation/Gait assistance: Mod assist Ambulation Distance (Feet): 90 Feet Assistive device: 1 person hand held assist Gait Pattern/deviations: Step-through pattern, Decreased stride length, Shuffle, Narrow base of support General Gait Details: patient very unsteady with ambulation, also required assist for safety due to visual deficits and balance deficits related to LE weakness and limited RUE altering overall gait Gait velocity: decreased Gait velocity interpretation: Below normal speed for age/gender    ADL: ADL Overall ADL's : Needs assistance/impaired Eating/Feeding: Set up, Sitting Grooming: Minimal assistance, Sitting Upper Body Bathing: Minimal assitance, Sitting Lower Body Bathing: Minimal assistance, Sit to/from stand Upper Body Dressing : Minimal assistance, Sitting Upper Body Dressing Details (indicate cue type and reason): for donning gown Lower Body Dressing: Minimal assistance, Sit to/from stand Lower Body Dressing Details (indicate cue type and reason): Min assist to don socks sitting EOB. Pt able to cross foot over opposite knee to assist with donning socks. Toilet Transfer: Moderate assistance, Ambulation, BSC, +2 for safety/equipment Toilet Transfer Details (indicate cue type and reason): Simulated by sit to stand from EOB with functional mobility in room. Toileting- Clothing Manipulation and Hygiene: Minimal assistance, Sit to/from stand Functional mobility during ADLs: Moderate assistance, +2 for safety/equipment (1 person hand held assist) General ADL Comments: Pt reports intermittent confusion over the past few weeks; can identify two instances when she was out driving and got lost/"turned around".   Cognition: Cognition Overall Cognitive Status: Impaired/Different from baseline Arousal/Alertness: Awake/alert Orientation Level: Oriented X4 Attention: Sustained Sustained Attention:  Impaired Sustained Attention Impairment: Verbal basic Memory: Impaired Memory Impairment: Decreased recall of new information, Retrieval deficit Awareness: Impaired Awareness Impairment: Intellectual impairment, Emergent impairment, Anticipatory impairment Problem Solving: Impaired Problem Solving Impairment: Verbal basic Safety/Judgment: Impaired Cognition Arousal/Alertness: Awake/alert Behavior During Therapy: Flat affect, Impulsive Overall Cognitive Status: Impaired/Different from baseline Area of Impairment: Attention, Memory, Following commands, Safety/judgement, Awareness, Problem solving Current Attention Level: Selective Memory: Decreased short-term memory Following Commands: Follows one step commands with increased time Safety/Judgement: Decreased awareness of safety, Decreased awareness of deficits Awareness: Emergent Problem Solving: Slow processing, Decreased initiation, Difficulty sequencing, Requires verbal cues, Requires tactile cues General Comments: patient tangential throughout session, also acknowledges some various degress of baseline confusion over the past several weeks. Patient indicated that she started using a cane because she 'got lost driving to her hair appointment' and does not comprehend while this correlation does not appear fitting  Physical Exam: Blood pressure 127/65, pulse 84, temperature 97.8 F (36.6 C), temperature source Oral, resp. rate 16, height 5' 2" (  1.575 m), weight 85.7 kg (188 lb 15 oz), SpO2 98 %. Physical Exam Gen: NAD. Well-developed. Vital signs reviewed. HENT:  Multiple bruising to the face . Injected right sclera. Neck: Normal range of motion. Neck supple. No thyromegaly present.  Cardiovascular: Normal rate and regular rhythm.   Respiratory: Effort normal and breath sounds normal. No respiratory distress.  GI: Soft. Bowel sounds are normal. She exhibits no distension.  Musculoskeletal:  Right upper extremity with sling  B/l LE  edema Neurological: She is alert.  Oriented to person place and time.  Sensation intact to light touch. Motor: LUE: 4+/5 proximal to distal B/l LE: Hip flexion 4-/5, knee extension 4/5, ankle dorsi/plantar flexion 4+/5 Skin: Skin is warm and dry   Medical Problem List and Plan: 1. TBI/SAH/right distal radius fracture, right maxillary right zygomatic arch fracture  secondary to fall 04/16/2016  Extensive discussion with family regarding IRF and prognosis and future interventions 2.  DVT Prophylaxis/Anticoagulation: SCDs. Monitor for any signs of DVT 3. Pain Management/Fibromyalgia: MS Contin 30 mg every 12 hours, MSIR 30 mg every 6 hours as needed 4. Mood: Cymbalta 60 mg twice a day, Ativan 1 mg daily at bedtime, Requip 1 mg daily at bedtime as needed, Xanax 0.25 mg twice daily as needed. 5. Neuropsych: This patient is capable of making decisions on her own behalf. 6. Skin/Wound Care: Routine skin checks 7. Fluids/Electrolytes/Nutrition: Routine I&O's with follow-up chemistries 8. Right comminuted dorsally impacted fracture of the distal radius. No plan for surgical intervention at this time. Currently nonweightbearing. Follow-up per Dr. Kuzma. 9. Right maxillary zygomatic arch fracture. Follow-up per Dr. Teoh with conservative care   Post Admission Physician Evaluation: 1. Functional deficits secondary  to TBI/SAH/right distal radius fracture, right maxillary right zygomatic arch fracture. 2. Patient is admitted to receive collaborative, interdisciplinary care between the physiatrist, rehab nursing staff, and therapy team. 3. Patient's level of medical complexity and substantial therapy needs in context of that medical necessity cannot be provided at a lesser intensity of care such as a SNF. 4. Patient has experienced substantial functional loss from his/her baseline which was documented above under the "Functional History" and "Functional Status" headings.  Judging by the patient's  diagnosis, physical exam, and functional history, the patient has potential for functional progress which will result in measurable gains while on inpatient rehab.  These gains will be of substantial and practical use upon discharge  in facilitating mobility and self-care at the household level. 5. Physiatrist will provide 24 hour management of medical needs as well as oversight of the therapy plan/treatment and provide guidance as appropriate regarding the interaction of the two. 6. 24 hour rehab nursing will assist with bowel management, safety, skin/wound care, disease management, pain management and patient education  and help integrate therapy concepts, techniques,education, etc. 7. PT will assess and treat for/with: Lower extremity strength, range of motion, stamina, balance, functional mobility, safety, adaptive techniques and equipment, woundcare, coping skills, pain control, education.   Goals are: Supervision 8. OT will assess and treat for/with: ADL's, functional mobility, safety, upper extremity strength, adaptive techniques and equipment, wound mgt, ego support, and community reintegration.   Goals are: Supervision/Mod I. Therapy may not proceed with showering this patient. 9. Case Management and Social Worker will assess and treat for psychological issues and discharge planning. 10. Team conference will be held weekly to assess progress toward goals and to determine barriers to discharge. 11. Patient will receive at least 3 hours of therapy per day at least   5 days per week. 12. ELOS: 14-17 days.       13. Prognosis:  good    Ankit Patel, MD 04/20/2016 

## 2016-04-20 NOTE — Progress Notes (Signed)
Jillian Oyster, MD Physician Signed Physical Medicine and Rehabilitation  Consult Note Date of Service: 04/17/2016 3:06 PM  Related encounter: ED to Hosp-Admission (Current) from 04/16/2016 in [facility]     Expand All Collapse All   [] Hide copied text [] Hover for attribution information      Physical Medicine and Rehabilitation Consult Reason for Consult: Right distal radius fracture and orbital fracture/SAH Referring Physician: Trauma   HPI: Jillian Morris is a 69 y.o. right handed female with history of fibromyalgia, chronic fatigue syndrome, chronic low back pain. Per chart review patient lives with spouse independently with a cane prior to admission. One level home. Presented 04/16/2016 after ground-level fall in the hallway of her ophthalmologist office. CT of the head showed subarachnoid hemorrhage in the salt diet along the medial right frontal lobe and in the left side of the quadrigeminal plate cistern. Complex fracture involving the lateral inferior walls of the right orbit. Fractures to the anterior posterior superior inferior and lateral walls of the right maxillary sinus. Right zygomatic arch fracture. CT cervical spine negative. Noted also findings of right comminuted dorsally impacted fracture of the distal radius with dorsal angulation of the distal radial articular surface. Dr. Venetia Maxon for follow-up in regards to [facility] advise conservative care. Follow-up cranial CT scan stable. Await recommendations of right distal radius fracture. Currently with right upper extremity sling. Hospital course pain management. Physical occupational therapy evaluations completed with recommendations of physical medicine rehabilitation consult   Review of Systems  Constitutional: Positive for malaise/fatigue. Negative for chills and fever.  Eyes: Negative for blurred vision and double vision.  Respiratory: Negative for cough and shortness of breath.     Cardiovascular: Positive for leg swelling. Negative for chest pain and palpitations.  Gastrointestinal: Positive for constipation. Negative for nausea and vomiting.       GERD  Genitourinary: Negative for dysuria and hematuria.  Musculoskeletal: Positive for back pain and falls.  Skin: Negative for rash.  Neurological: Positive for headaches. Negative for seizures.  Psychiatric/Behavioral: Positive for depression.       Anxiety  All other systems reviewed and are negative.      Past Medical History:  Diagnosis Date  . Acne rosacea   . Anemia    s/p gastric bypass, malabsorption  . Anxiety   . Arthritis   . B12 deficiency   . Chronic fatigue fibromyalgia syndrome   . Chronic low back pain   . Depression   . Fibromyalgia   . GERD (gastroesophageal reflux disease)   . Graves disease   . H/O hiatal hernia   . Hypothyroidism   . IBS (irritable bowel syndrome)   . Lymphedema   . Meralgia paraesthetica   . Sjogren's syndrome (HCC)   . Status post dilation of esophageal narrowing   . Stroke [facility])         Past Surgical History:  Procedure Laterality Date  . ABDOMINAL HYSTERECTOMY    . APPENDECTOMY    . BALLOON DILATION  03/30/2012   Procedure: BALLOON DILATION;  Surgeon: Louis Meckel, MD;  Location: [facility] ENDOSCOPY;  Service: Endoscopy;  Laterality: N/A;  . BOTOX INJECTION N/A 12/26/2014   Procedure: BOTOX INJECTION;  Surgeon: Louis Meckel, MD;  Location: [facility] ENDOSCOPY;  Service: Endoscopy;  Laterality: N/A;  . ENDOMETRIAL ABLATION     x 4  . ESOPHAGEAL MANOMETRY N/A 05/15/2013   Procedure: ESOPHAGEAL MANOMETRY (EM);  Surgeon: Louis Meckel, MD;  Location: [facility] ENDOSCOPY;  Service: Endoscopy;  Laterality: N/A;  . ESOPHAGOGASTRODUODENOSCOPY  03/30/2012   Procedure: ESOPHAGOGASTRODUODENOSCOPY (EGD);  Surgeon: Louis Meckel, MD;  Location: [facility];  Service: Endoscopy;  Laterality: N/A;  . ESOPHAGOGASTRODUODENOSCOPY  05/04/2012    Procedure: ESOPHAGOGASTRODUODENOSCOPY (EGD);  Surgeon: Louis Meckel, MD;  Location: [facility] ENDOSCOPY;  Service: Endoscopy;  Laterality: N/A;  . ESOPHAGOGASTRODUODENOSCOPY N/A 12/26/2014   Procedure: ESOPHAGOGASTRODUODENOSCOPY (EGD);  Surgeon: Louis Meckel, MD;  Location: [facility];  Service: Endoscopy;  Laterality: N/A;  botox injection  . GASTRIC BYPASS    . LAMINECTOMY     L3-L5  . 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 THYROIDECTOMY          Family History  Problem Relation Age of Onset  . Prostate cancer Maternal Grandfather   . Lymphoma Maternal Uncle   . Heart disease Father   . Heart disease Sister   . Irritable bowel syndrome Sister   . Dementia Mother   . Stroke Maternal Grandmother   . Appendicitis Paternal Grandmother   . Colon cancer Neg Hx    Social History:  reports that she has never smoked. She has never used smokeless tobacco. She reports that she does not drink alcohol or use drugs. Allergies:       Allergies  Allergen Reactions  . Actonel [Risedronate Sodium]     GI Upset  . Ambien [Zolpidem Tartrate] Other (See Comments)    Went crazy, hallucinations  . Augmentin [Amoxicillin-Pot Clavulanate] Other (See Comments)    GI upset  . Depakote [Divalproex Sodium]     Hallucinations  . Inderal [Propranolol] Other (See Comments)    unknown  . Nsaids Other (See Comments)    Pt. Had gastric bypass surgery   . Penicillins Other (See Comments)    Unknown childhood reaction  . Silicon Nausea And Vomiting    Thyroid storm  . Adhesive [Tape] Itching and Rash    Please use "paper" tape  . Latex Swelling and Rash    Redness lips  . Nickel Itching and Rash    Other reaction(s): Contact Dermatitis (intolerance)         Medications Prior to Admission  Medication Sig Dispense Refill  . ALPRAZolam (XANAX) 0.25 MG tablet Take 0.25 mg by mouth 2 (two) times daily as needed (for panic attacks).     . cyanocobalamin  (,VITAMIN B-12,) 1000 MCG/ML injection Inject 1,000 mcg into the muscle every 30 (thirty) days.     . DULoxetine (CYMBALTA) 60 MG capsule Take 60 mg by mouth 2 (two) times daily.    Marland Kitchen lamoTRIgine (LAMICTAL) 100 MG tablet Take 300 mg by mouth at bedtime.    . lidocaine (LIDODERM) 5 % Place 1 patch onto the skin daily. Remove & Discard patch within 12 hours or as directed by MD    . LORazepam (ATIVAN) 1 MG tablet Take 1 mg by mouth at bedtime.     . metroNIDAZOLE (METROCREAM) 0.75 % cream Apply 1 application topically 2 (two) times daily.     Marland Kitchen morphine (MS CONTIN) 30 MG 12 hr tablet Take 30 mg by mouth every 12 (twelve) hours.     . mupirocin cream (BACTROBAN) 2 % 1 application.    Marland Kitchen oxymorphone (OPANA) 10 MG tablet Take 10 mg by mouth every 6 (six) hours as needed for pain.    . ranitidine (ZANTAC) 300 MG tablet Take 300 mg by mouth at bedtime.     Marland Kitchen rOPINIRole (REQUIP) 1 MG tablet  1 TABLET (1 mg) BEFORE BEDTIME as needed for restless leg syndrome  6  . SYNTHROID 125 MCG tablet TAKE 1 TABLET (125 mcg) BY MOUTH EVERY MORNING ON EMPTY STOMACH ONCE DAILY  3  . dexlansoprazole (DEXILANT) 60 MG capsule TAKE 1 CAPSULE (60 MG TOTAL) BY MOUTH DAILY. (Patient not taking: Reported on 04/16/2016) 90 capsule 3    Home: Home Living Family/patient expects to be discharged to:: Private residence Living Arrangements: Spouse/significant other Available Help at Discharge: Family Type of Home: House Home Access: Level entry Home Layout: One level Bathroom Shower/Tub:  (sponge bathes) Bathroom Toilet: Handicapped height (one standard) Home Equipment: Cane - single point  Functional History: Prior Function Level of Independence: Independent with assistive device(s) Comments: SPC for mobility  Functional Status:  Mobility: Bed Mobility Overal bed mobility: Needs Assistance Bed Mobility: Supine to Sit Supine to sit: Min guard General bed mobility comments: min guard for  safety Transfers Overall transfer level: Needs assistance Equipment used: 1 person hand held assist Transfers: Sit to/from Stand Sit to Stand: Min assist General transfer comment: min assist for balance and stability when elevating to upright Ambulation/Gait Ambulation/Gait assistance: Mod assist Ambulation Distance (Feet): 90 Feet Assistive device: 1 person hand held assist Gait Pattern/deviations: Step-through pattern, Decreased stride length, Shuffle, Narrow base of support General Gait Details: patient very unsteady with ambulation, also required assist for safety due to visual deficits and balance deficits related to LE weakness and limited RUE altering overall gait Gait velocity: decreased Gait velocity interpretation: Below normal speed for age/gender    ADL: ADL Overall ADL's : Needs assistance/impaired Eating/Feeding: Set up, Sitting Grooming: Minimal assistance, Sitting Upper Body Bathing: Minimal assitance, Sitting Lower Body Bathing: Minimal assistance, Sit to/from stand Upper Body Dressing : Minimal assistance, Sitting Upper Body Dressing Details (indicate cue type and reason): for donning gown Lower Body Dressing: Minimal assistance, Sit to/from stand Lower Body Dressing Details (indicate cue type and reason): Min assist to don socks sitting EOB. Pt able to cross foot over opposite knee to assist with donning socks. Toilet Transfer: Moderate assistance, Ambulation, BSC, +2 for safety/equipment Toilet Transfer Details (indicate cue type and reason): Simulated by sit to stand from EOB with functional mobility in room. Toileting- Clothing Manipulation and Hygiene: Minimal assistance, Sit to/from stand Functional mobility during ADLs: Moderate assistance, +2 for safety/equipment (1 person hand held assist) General ADL Comments: Pt reports intermittent confusion over the past few weeks; can identify two instances when she was out driving and got lost/"turned around".    Cognition: Cognition Overall Cognitive Status: Impaired/Different from baseline Orientation Level: Oriented X4 Cognition Arousal/Alertness: Awake/alert Behavior During Therapy: Flat affect, Impulsive Overall Cognitive Status: Impaired/Different from baseline Area of Impairment: Attention, Memory, Following commands, Safety/judgement, Awareness, Problem solving Current Attention Level: Selective Memory: Decreased short-term memory Following Commands: Follows one step commands with increased time Safety/Judgement: Decreased awareness of safety, Decreased awareness of deficits Awareness: Emergent Problem Solving: Slow processing, Decreased initiation, Difficulty sequencing, Requires verbal cues, Requires tactile cues General Comments: patient tangential throughout session, also acknowledges some various degress of baseline confusion over the past several weeks. Patient indicated that she started using a cane because she 'got lost driving to her hair appointment' and does not comprehend while this correlation does not appear fitting  Blood pressure (!) 114/56, pulse 87, temperature 98.6 F (37 C), temperature source Oral, resp. rate (!) 21, height 5\' 2"  (1.575 m), weight 85.7 kg (188 lb 15 oz), SpO2 93 %. Physical Exam  HENT:  Multiple bruising to the face  Neck: Normal range of motion. Neck supple. No thyromegaly present.  Cardiovascular: Normal rate and regular rhythm.   Respiratory: Effort normal and breath sounds normal. No respiratory distress.  GI: Soft. Bowel sounds are normal. She exhibits no distension.  Musculoskeletal:  Right upper extremity with sling  Neurological: She is alert.  Oriented to person place and time.  Skin: Skin is warm and dry.    Lab Results Last 24 Hours       Results for orders placed or performed during the hospital encounter of 04/16/16 (from the past 24 hour(s))  Comprehensive metabolic panel     Status: Abnormal   Collection Time: 04/16/16   3:36 PM  Result Value Ref Range   Sodium 139 135 - 145 mmol/L   Potassium 4.2 3.5 - 5.1 mmol/L   Chloride 103 101 - 111 mmol/L   CO2 27 22 - 32 mmol/L   Glucose, Bld 112 (H) 65 - 99 mg/dL   BUN 11 6 - 20 mg/dL   Creatinine, Ser 4.09 (H) 0.44 - 1.00 mg/dL   Calcium 9.3 8.9 - 81.1 mg/dL   Total Protein 5.9 (L) 6.5 - 8.1 g/dL   Albumin 3.7 3.5 - 5.0 g/dL   AST 20 15 - 41 U/L   ALT 8 (L) 14 - 54 U/L   Alkaline Phosphatase 89 38 - 126 U/L   Total Bilirubin 0.9 0.3 - 1.2 mg/dL   GFR calc non Af Amer 41 (L) >60 mL/min   GFR calc Af Amer 48 (L) >60 mL/min   Anion gap 9 5 - 15  CBC with Differential     Status: Abnormal   Collection Time: 04/16/16  3:36 PM  Result Value Ref Range   WBC 4.4 4.0 - 10.5 K/uL   RBC 4.00 3.87 - 5.11 MIL/uL   Hemoglobin 11.9 (L) 12.0 - 15.0 g/dL   HCT 91.4 78.2 - 95.6 %   MCV 93.3 78.0 - 100.0 fL   MCH 29.8 26.0 - 34.0 pg   MCHC 31.9 30.0 - 36.0 g/dL   RDW 21.3 08.6 - 57.8 %   Platelets 161 150 - 400 K/uL   Neutrophils Relative % 56 %   Neutro Abs 2.5 1.7 - 7.7 K/uL   Lymphocytes Relative 33 %   Lymphs Abs 1.4 0.7 - 4.0 K/uL   Monocytes Relative 5 %   Monocytes Absolute 0.2 0.1 - 1.0 K/uL   Eosinophils Relative 5 %   Eosinophils Absolute 0.2 0.0 - 0.7 K/uL   Basophils Relative 1 %   Basophils Absolute 0.0 0.0 - 0.1 K/uL  MRSA PCR Screening     Status: None   Collection Time: 04/16/16 11:00 PM  Result Value Ref Range   MRSA by PCR NEGATIVE NEGATIVE  CBC     Status: Abnormal   Collection Time: 04/17/16  3:59 AM  Result Value Ref Range   WBC 5.0 4.0 - 10.5 K/uL   RBC 3.66 (L) 3.87 - 5.11 MIL/uL   Hemoglobin 10.9 (L) 12.0 - 15.0 g/dL   HCT 46.9 (L) 62.9 - 52.8 %   MCV 92.3 78.0 - 100.0 fL   MCH 29.8 26.0 - 34.0 pg   MCHC 32.2 30.0 - 36.0 g/dL   RDW 41.3 24.4 - 01.0 %   Platelets 172 150 - 400 K/uL  Basic metabolic panel     Status: Abnormal   Collection Time: 04/17/16  3:59 AM  Result Value Ref  Range  Sodium 138 135 - 145 mmol/L   Potassium 4.3 3.5 - 5.1 mmol/L   Chloride 101 101 - 111 mmol/L   CO2 27 22 - 32 mmol/L   Glucose, Bld 103 (H) 65 - 99 mg/dL   BUN 11 6 - 20 mg/dL   Creatinine, Ser 1.61 (H) 0.44 - 1.00 mg/dL   Calcium 8.9 8.9 - 09.6 mg/dL   GFR calc non Af Amer 54 (L) >60 mL/min   GFR calc Af Amer >60 >60 mL/min   Anion gap 10 5 - 15      Imaging Results (Last 48 hours)  Dg Chest 1 View  Result Date: 04/16/2016 CLINICAL DATA:  Pt c/o bilateral upper back pain, generalized right wrist pain, swelling, and bruising, generalized right elbow pain, and bilateral hip pain after experiencing an unwitnessed fall this afternoon. No known hx of prior injuries or surgeries. No hx of heart or lung problems. Pt is a nonsmoker. EXAM: CHEST 1 VIEW COMPARISON:  12/29/2010 FINDINGS: The cardiac silhouette is normal in size and configuration. No mediastinal or hilar masses or evidence of adenopathy. Lungs are clear.  No pleural effusion or pneumothorax. Bony thorax is intact. IMPRESSION: No active disease. Electronically Signed   By: Amie Portland M.D.   On: 04/16/2016 17:02   Dg Thoracic Spine 2 View  Result Date: 04/16/2016 CLINICAL DATA:  Pt c/o bilateral upper back pain, generalized right wrist pain, swelling, and bruising, generalized right elbow pain, and bilateral hip pain after experiencing an unwitnessed fall this afternoon. No known hx of prior injuries or surgeries. No hx of heart or lung problems. Pt is a nonsmoker. EXAM: THORACIC SPINE 2 VIEWS COMPARISON:  Chest CT, 02/18/2011 FINDINGS: No fracture.  No spondylolisthesis.  No bone lesion. Bones are demineralized. There are mild disc degenerative changes centered in the mid thoracic spine. Soft tissues are unremarkable. IMPRESSION: 1. No fracture, spondylolisthesis or acute finding. Electronically Signed   By: Amie Portland M.D.   On: 04/16/2016 17:03   Dg Pelvis 1-2 Views  Result Date: 04/16/2016 CLINICAL  DATA:  Bilateral upper back pain.  Status post fall. EXAM: PELVIS - 1-2 VIEW COMPARISON:  None. FINDINGS: Generalized osteopenia. No acute fracture or subluxation. 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 IMPRESSION: No acute osseous injury of the pelvis. Electronically Signed   By: Elige Ko   On: 04/16/2016 16:59   Dg Elbow Complete Right  Result Date: 04/16/2016 CLINICAL DATA:  Pt c/o bilateral upper back pain, generalized right wrist pain, swelling, and bruising, generalized right elbow pain, and bilateral hip pain after experiencing an unwitnessed fall this afternoon. No known hx of prior injuries or surgeries. No hx of heart or lung problems. Pt is a nonsmoker. EXAM: RIGHT ELBOW - COMPLETE 3+ VIEW COMPARISON:  None. FINDINGS: No fracture or dislocation. Elbow joint normally spaced and aligned.  No joint effusion. Soft tissues are unremarkable. IMPRESSION: No fracture or dislocation. Electronically Signed   By: Amie Portland M.D.   On: 04/16/2016 16:58   Dg Wrist Complete Right  Result Date: 04/16/2016 CLINICAL DATA:  Pt c/o bilateral upper back pain, generalized right wrist pain, swelling, and bruising, generalized right elbow pain, and bilateral hip pain after experiencing an unwitnessed fall this afternoon. No known hx of prior injuries or surgeries. No hx of heart or lung problems. Pt is a nonsmoker. EXAM: RIGHT WRIST - COMPLETE 3+ VIEW COMPARISON:  None. FINDINGS: There is a comminuted fracture of the  distal radius with an associated nondisplaced ulnar styloid fracture. The primary fracture is transverse across the metaphysis. There is a separate fracture component of the radial styloid and other fracture components intersecting the lunate facet than the dorsal aspect of the distal radial articular surface. Fracture is dorsally impacted leading to dorsal angulation of the distal radial articular surface of approximately 17 degrees. Bones  are demineralized. There is no dislocation. There is diffuse surrounding soft tissue edema. IMPRESSION: 1. Comminuted, dorsally impacted fracture of the distal radial metaphysis with dorsal angulation of the distal radial articular surface. There is associated ulnar styloid fracture. No dislocation. Electronically Signed   By: Amie Portland M.D.   On: 04/16/2016 17:05   Ct Head Without Contrast  Result Date: 04/17/2016 CLINICAL DATA:  Follow-up subarachnoid hemorrhage. EXAM: CT HEAD WITHOUT CONTRAST TECHNIQUE: Contiguous axial images were obtained from the base of the skull through the vertex without intravenous contrast. COMPARISON:  Yesterday at 1606 hours FINDINGS: Brain: Parafalcine subarachnoid hemorrhage in the right frontal lobe is unchanged, small in volume. Subarachnoid hemorrhage in the lower left quadrigeminal plate is unchanged, small in volume. No new hemorrhage. No developing hydrocephalus. No acute ischemia. Stable brain volume. Vascular: No hyperdense vessel or unexpected calcification. Skull: No calvarial fracture. Probable sebaceous cyst in the midline and left frontal scalp. Sinuses/Orbits: Multiple facial fractures on the right, assessed on recent face CT. IMPRESSION: Stable small volume subarachnoid hemorrhage in the parafalcine right frontal lobe and left quadrigeminal plate cistern. No new hemorrhage or acute abnormality.  No hydrocephalus. Electronically Signed   By: Rubye Oaks M.D.   On: 04/17/2016 02:49   Ct Head Wo Contrast  Result Date: 04/16/2016 CLINICAL DATA:  Pain after trauma EXAM: CT HEAD WITHOUT CONTRAST CT MAXILLOFACIAL WITHOUT CONTRAST CT CERVICAL SPINE WITHOUT CONTRAST TECHNIQUE: Multidetector CT imaging of the head, cervical spine, and maxillofacial structures were performed using the standard protocol without intravenous contrast. Multiplanar CT image reconstructions of the cervical spine and maxillofacial structures were also generated. COMPARISON:  September 03, 2014 FINDINGS: CT HEAD FINDINGS There are multiple fractures in the right side of the face which will be described on the maxillofacial portion of the study. There is opacification of the right maxillary sinus. The remainder of the paranasal sinuses are normal. The mastoid air cells and middle ears are normal. There is soft tissue swelling in the right side of the face. The globes are intact. The remainder of the extracranial soft tissues are normal. There is high attenuation in the left side of the quadrigeminal plate cistern on series 3, image 11, consistent with subarachnoid hemorrhage. There is high attenuation in the sulci of the medial right frontal lobe such as on series 3, image 13 and image 15. These findings are new since January 2016. No subdural or epidural hemorrhage. Cerebellum and brainstem are normal. Basal cisterns are patent. Ventricles and sulci are normal for age and contain no blood. No mass effect or midline shift. No cortical ischemia or infarct. CT MAXILLOFACIAL FINDINGS There are complicated fractures through the right facial bones. The roof and medial walls of the right orbit are intact. There are fractures through the lateral wall which are mildly displaced. Displaced fractures are also seen through the inferior wall of the right orbit. Blood and debris obscure the inferior rectus muscle but it appears to remain within the orbit with no evidence of entrapment. Displaced fractures are seen through the lateral wall of the right maxillary sinus extending into the floor. Anterior, posterior,  lateral, inferior, and superior wall maxillary sinus fractures identified. The patient has 4 maxillary screws. The fractures through the floor of the right maxillary sinus appear to disrupt the cement around at least 1 of these right sided screws. The medial wall of the right maxillary sinus is intact. The pterygoid plates remain intact. There is a depressed fracture through the right zygomatic arch  anteriorly and a nondisplaced fracture through the posterior right zygomatic arch. The left orbit is intact as is the left zygomatic arch. The mandible is also intact. CT CERVICAL SPINE FINDINGS C5 demonstrates retrolisthesis versus both C4 and C6 with mild focal kyphosis. Retrolisthesis of C5 versus C4 is 2.5 mm which is similar to the retrolisthesis at C5-6. There is minimal anterolisthesis of T1 versus T2. No other malalignment. No fractures. Multilevel degenerative changes most marked at C5-6. Degenerative changes also seen with right greater than left C5-6 neural foraminal narrowing. No critical canal stenosis. IMPRESSION: 1. Subarachnoid hemorrhage in the sulci along the medial right frontal lobe and in the left side of the quadrigeminal plate cistern. 2. Complex fracture involving the lateral and inferior walls of the right orbit without evidence of entrapment of the inferior rectus muscle. There are fractures through the anterior, posterior superior, inferior and lateral walls of the right maxillary sinus. The maxillary fractures affect at least 1 of the 2 maxillary screws. Right zygomatic arch fractures are noted. 3. The malalignment in the cervical spine spine described above is favored to be degenerative with no identified fracture or soft tissue swelling. Findings called to Dr. Adela Lank Electronically Signed   By: Gerome Sam III M.D   On: 04/16/2016 16:43   Ct Cervical Spine Wo Contrast  Result Date: 04/16/2016 CLINICAL DATA:  Pain after trauma EXAM: CT HEAD WITHOUT CONTRAST CT MAXILLOFACIAL WITHOUT CONTRAST CT CERVICAL SPINE WITHOUT CONTRAST TECHNIQUE: Multidetector CT imaging of the head, cervical spine, and maxillofacial structures were performed using the standard protocol without intravenous contrast. Multiplanar CT image reconstructions of the cervical spine and maxillofacial structures were also generated. COMPARISON:  September 03, 2014 FINDINGS: CT HEAD FINDINGS There are multiple  fractures in the right side of the face which will be described on the maxillofacial portion of the study. There is opacification of the right maxillary sinus. The remainder of the paranasal sinuses are normal. The mastoid air cells and middle ears are normal. There is soft tissue swelling in the right side of the face. The globes are intact. The remainder of the extracranial soft tissues are normal. There is high attenuation in the left side of the quadrigeminal plate cistern on series 3, image 11, consistent with subarachnoid hemorrhage. There is high attenuation in the sulci of the medial right frontal lobe such as on series 3, image 13 and image 15. These findings are new since January 2016. No subdural or epidural hemorrhage. Cerebellum and brainstem are normal. Basal cisterns are patent. Ventricles and sulci are normal for age and contain no blood. No mass effect or midline shift. No cortical ischemia or infarct. CT MAXILLOFACIAL FINDINGS There are complicated fractures through the right facial bones. The roof and medial walls of the right orbit are intact. There are fractures through the lateral wall which are mildly displaced. Displaced fractures are also seen through the inferior wall of the right orbit. Blood and debris obscure the inferior rectus muscle but it appears to remain within the orbit with no evidence of entrapment. Displaced fractures are seen through the lateral wall of the right maxillary  sinus extending into the floor. Anterior, posterior, lateral, inferior, and superior wall maxillary sinus fractures identified. The patient has 4 maxillary screws. The fractures through the floor of the right maxillary sinus appear to disrupt the cement around at least 1 of these right sided screws. The medial wall of the right maxillary sinus is intact. The pterygoid plates remain intact. There is a depressed fracture through the right zygomatic arch anteriorly and a nondisplaced fracture through the  posterior right zygomatic arch. The left orbit is intact as is the left zygomatic arch. The mandible is also intact. CT CERVICAL SPINE FINDINGS C5 demonstrates retrolisthesis versus both C4 and C6 with mild focal kyphosis. Retrolisthesis of C5 versus C4 is 2.5 mm which is similar to the retrolisthesis at C5-6. There is minimal anterolisthesis of T1 versus T2. No other malalignment. No fractures. Multilevel degenerative changes most marked at C5-6. Degenerative changes also seen with right greater than left C5-6 neural foraminal narrowing. No critical canal stenosis. IMPRESSION: 1. Subarachnoid hemorrhage in the sulci along the medial right frontal lobe and in the left side of the quadrigeminal plate cistern. 2. Complex fracture involving the lateral and inferior walls of the right orbit without evidence of entrapment of the inferior rectus muscle. There are fractures through the anterior, posterior superior, inferior and lateral walls of the right maxillary sinus. The maxillary fractures affect at least 1 of the 2 maxillary screws. Right zygomatic arch fractures are noted. 3. The malalignment in the cervical spine spine described above is favored to be degenerative with no identified fracture or soft tissue swelling. Findings called to Dr. Adela Lank Electronically Signed   By: Gerome Sam III M.D   On: 04/16/2016 16:43   Ct Maxillofacial Wo Cm  Result Date: 04/16/2016 CLINICAL DATA:  Pain after trauma EXAM: CT HEAD WITHOUT CONTRAST CT MAXILLOFACIAL WITHOUT CONTRAST CT CERVICAL SPINE WITHOUT CONTRAST TECHNIQUE: Multidetector CT imaging of the head, cervical spine, and maxillofacial structures were performed using the standard protocol without intravenous contrast. Multiplanar CT image reconstructions of the cervical spine and maxillofacial structures were also generated. COMPARISON:  September 03, 2014 FINDINGS: CT HEAD FINDINGS There are multiple fractures in the right side of the face which will be described on  the maxillofacial portion of the study. There is opacification of the right maxillary sinus. The remainder of the paranasal sinuses are normal. The mastoid air cells and middle ears are normal. There is soft tissue swelling in the right side of the face. The globes are intact. The remainder of the extracranial soft tissues are normal. There is high attenuation in the left side of the quadrigeminal plate cistern on series 3, image 11, consistent with subarachnoid hemorrhage. There is high attenuation in the sulci of the medial right frontal lobe such as on series 3, image 13 and image 15. These findings are new since January 2016. No subdural or epidural hemorrhage. Cerebellum and brainstem are normal. Basal cisterns are patent. Ventricles and sulci are normal for age and contain no blood. No mass effect or midline shift. No cortical ischemia or infarct. CT MAXILLOFACIAL FINDINGS There are complicated fractures through the right facial bones. The roof and medial walls of the right orbit are intact. There are fractures through the lateral wall which are mildly displaced. Displaced fractures are also seen through the inferior wall of the right orbit. Blood and debris obscure the inferior rectus muscle but it appears to remain within the orbit with no evidence of entrapment. Displaced fractures are seen through the  lateral wall of the right maxillary sinus extending into the floor. Anterior, posterior, lateral, inferior, and superior wall maxillary sinus fractures identified. The patient has 4 maxillary screws. The fractures through the floor of the right maxillary sinus appear to disrupt the cement around at least 1 of these right sided screws. The medial wall of the right maxillary sinus is intact. The pterygoid plates remain intact. There is a depressed fracture through the right zygomatic arch anteriorly and a nondisplaced fracture through the posterior right zygomatic arch. The left orbit is intact as is the left  zygomatic arch. The mandible is also intact. CT CERVICAL SPINE FINDINGS C5 demonstrates retrolisthesis versus both C4 and C6 with mild focal kyphosis. Retrolisthesis of C5 versus C4 is 2.5 mm which is similar to the retrolisthesis at C5-6. There is minimal anterolisthesis of T1 versus T2. No other malalignment. No fractures. Multilevel degenerative changes most marked at C5-6. Degenerative changes also seen with right greater than left C5-6 neural foraminal narrowing. No critical canal stenosis. IMPRESSION: 1. Subarachnoid hemorrhage in the sulci along the medial right frontal lobe and in the left side of the quadrigeminal plate cistern. 2. Complex fracture involving the lateral and inferior walls of the right orbit without evidence of entrapment of the inferior rectus muscle. There are fractures through the anterior, posterior superior, inferior and lateral walls of the right maxillary sinus. The maxillary fractures affect at least 1 of the 2 maxillary screws. Right zygomatic arch fractures are noted. 3. The malalignment in the cervical spine spine described above is favored to be degenerative with no identified fracture or soft tissue swelling. Findings called to Dr. Adela Lank Electronically Signed   By: Gerome Sam III M.D   On: 04/16/2016 16:43     Assessment/Plan: Diagnosis: SAH after fall with associated facial fx's, distal right radius fx 1. Does the need for close, 24 hr/day medical supervision in concert with the patient's rehab needs make it unreasonable for this patient to be served in a less intensive setting? Yes 4. Co-Morbidities requiring supervision/potential complications: .multiple orhto issues, pain/ wound care,  5. Due to bladder management, bowel management, safety, skin/wound care, disease management, medication administration, pain management and patient education, does the patient require 24 hr/day rehab nursing? Yes 2. Does the patient require coordinated care of a physician, rehab  nurse, PT (1-2 hrs/day, 5 days/week), OT (1-2 hrs/day, 5 days/week) and SLP (1-2 hrs/day, 5 days/week) to address physical and functional deficits in the context of the above medical diagnosis(es)? Yes Addressing deficits in the following areas: balance, endurance, locomotion, strength, transferring, bowel/bladder control, bathing, dressing, feeding, grooming, toileting, cognition, speech, language, swallowing and psychosocial support 3. Can the patient actively participate in an intensive therapy program of at least 3 hrs of therapy per day at least 5 days per week? Yes and Potentially 4. The potential for patient to make measurable gains while on inpatient rehab is excellent 5. Anticipated functional outcomes upon discharge from inpatient rehab are modified independent  with PT, modified independent and supervision with OT, modified independent and supervision with SLP. 6. Estimated rehab length of stay to reach the above functional goals is: 7-12 days 7. Does the patient have adequate social supports and living environment to accommodate these discharge functional goals? Yes 8. Anticipated D/C setting: Home 9. Anticipated post D/C treatments: HH therapy 10. Overall Rehab/Functional Prognosis: excellent  RECOMMENDATIONS: This patient's condition is appropriate for continued rehabilitative care in the following setting: CIR Patient has agreed to participate in recommended program.  Potentially Note that insurance prior authorization may be required for reimbursement for recommended care.  Comment: Rehab Admissions Coordinator to follow up.  Thanks,  Jillian Oyster, MD, [facility]     04/17/2016    Revision History                   Routing History

## 2016-04-20 NOTE — Progress Notes (Signed)
I met with pt and her husband at bedside with Dr. Posey Pronto and then Dr. Fredna Dow who arrived to discuss future surgical intervention to wrist. Dr. Fredna Dow to contact Rehab PA to plan future surgery either this week or next. I have insurance approval and will admit pt to inpt rehab today. RN CM and SW are aware. [phone]

## 2016-04-20 NOTE — Progress Notes (Signed)
Occupational Therapy Treatment Patient Details Name: Jillian Morris MRN: [id number] DOB: Apr 01, 1946 Today's Date: 04/20/2016    History of present illness Patient with a ground level fall in the hallway of her ophthalmologist office.  Brief LOC of minutes. Pt with R distal radius fx and oribital fx. CT on 8/25 + for stable small volume subarachnoid hemorrhage in the parafalcine right frontal lobe and left quadrigeminal plate cistern. PMHx: Anxiety, Arthritis, Chronic lower back pain, Depression, Fibromyalgia, GERD, Graves disease, IBS, Sjogren's syndrome, Stroke.    OT comments  Pt making progress towards goals and gaining strength and increased independence for self care tasks. Pt and husband provided with sling education and also positioning the RUE in bed for edema management. Pt and husband practiced don/doff of sling. Discharge to CIR still appropriate.   Follow Up Recommendations  CIR;Supervision/Assistance - 24 hour    Equipment Recommendations  3 in 1 bedside comode (to be determined by next venue of care)    Recommendations for Other Services      Precautions / Restrictions Precautions Precautions: Fall Required Braces or Orthoses: Sling Restrictions Weight Bearing Restrictions: Yes RUE Weight Bearing: Non weight bearing Other Position/Activity Restrictions: RUE sling       Mobility Bed Mobility Overal bed mobility: Needs Assistance Bed Mobility: Supine to Sit     Supine to sit: Min guard     General bed mobility comments: min guard for safety  Transfers Overall transfer level: Needs assistance Equipment used: 1 person hand held assist Transfers: Sit to/from Stand Sit to Stand: Min assist         General transfer comment: min assist for balance and stability when elevating to upright    Balance Overall balance assessment: Needs assistance Sitting-balance support: No upper extremity supported;Feet unsupported Sitting balance-Leahy Scale: Good      Standing balance support: Single extremity supported Standing balance-Leahy Scale: Poor Standing balance comment: Requires single UE support for balance in standing                   ADL Overall ADL's : Needs assistance/impaired     Grooming: Minimal assistance;Sitting Grooming Details (indicate cue type and reason): Pt able to wash face sitting          Upper Body Dressing : Moderate assistance;Sitting Upper Body Dressing Details (indicate cue type and reason): don/doff of sling     Toilet Transfer: Minimal assistance;Ambulation;Comfort height toilet Toilet Transfer Details (indicate cue type and reason): simulated with chair transfer         Functional mobility during ADLs: Minimal assistance (hand held assist) General ADL Comments: Pt unsteady on feet during ambulation to recliner in room., requiring hand held assist.  Pt and husband educated on wearing sling, positioning of sling, and practiced don/doff      Vision                     Perception     Praxis      Cognition   Behavior During Therapy: WFL for tasks assessed/performed Overall Cognitive Status: Within Functional Limits for tasks assessed                  General Comments: Pt able to answer questions and respond appropriately during session.    Extremity/Trunk Assessment               Exercises     Shoulder Instructions       General Comments  Pertinent Vitals/ Pain       Pain Assessment: Faces Faces Pain Scale: Hurts little more Pain Location: RUE Pain Descriptors / Indicators: Dull;Operative site guarding;Sore Pain Intervention(s): Monitored during session;Repositioned  Home Living                                          Prior Functioning/Environment              Frequency Min 2X/week     Progress Toward Goals  OT Goals(current goals can now be found in the care plan section)  Progress towards OT goals: Progressing toward  goals  Acute Rehab OT Goals Patient Stated Goal: Be able to perform peri care OT Goal Formulation: With patient Time For Goal Achievement: 05/01/16 Potential to Achieve Goals: Good ADL Goals Pt Will Perform Grooming: with supervision;standing Pt Will Transfer to Toilet: with supervision;ambulating;bedside commode  Plan Discharge plan remains appropriate    Co-evaluation                 End of Session Equipment Utilized During Treatment: Gait belt   Activity Tolerance Patient tolerated treatment well   Patient Left in chair;with call bell/phone within reach;with family/visitor present   Nurse Communication Mobility status        Time: [id number] OT Time Calculation (min): 37 min  Charges: OT General Charges $OT Visit: 1 Procedure OT Treatments $Self Care/Home Management : 8-22 mins $Therapeutic Activity: 8-22 mins  Evern BioLaura J Jahrell Hamor 04/20/2016, 4:17 PM Sherryl MangesLaura Donzel Romack OTR/L [phone]

## 2016-04-20 NOTE — Care Management Important Message (Signed)
Important Message  Patient Details  Name: Jillian GoodyBrenda L Broome MRN: [id number] Date of Birth: 08/14/1946   Medicare Important Message Given:  Yes    Dorena BodoIris Carlo Lorson 04/20/2016, 12:16 PM

## 2016-04-20 NOTE — Progress Notes (Signed)
Patient ID: Jillian Morris, female   DOB: 04/16/1946, 70 y.o.   MRN: [id number]   LOS: 3 days   Subjective: Doing well.   Objective: Vital signs in last 24 hours: Temp:  [97.8 F (36.6 C)-98.7 F (37.1 C)] 97.8 F (36.6 C) (08/28 0610) Pulse Rate:  [81-118] 84 (08/28 0610) Resp:  [12-17] 16 (08/28 0610) BP: (124-150)/(61-98) 127/65 (08/28 0610) SpO2:  [90 %-100 %] 98 % (08/28 0610) Last BM Date: 04/15/16   Physical Exam General appearance: alert and no distress Resp: clear to auscultation bilaterally Cardio: regular rate and rhythm GI: normal findings: bowel sounds normal and soft, non-tender Extremities: NVI   Assessment/Plan: Fall TBI/SAH- stable on F/U CT, Dr. Venetia MaxonStern consulting R wrist FX- Apparently Dr. Merlyn LotKuzma wants to operate on wrist, didn't think she'd be in hospital this long. Awaiting call back to discuss plan. R orbit/maxillary sinus/zygoma FXs s/p ORIF- per Dr. Suszanne Connerseoh, appreciate ophtho consult FEN- No issues VTE- SCD's Dispo - CIR when bed available depending on surgery    Freeman CaldronMichael J. Hero Mccathern, PA-C Pager: [phone] General Trauma PA Pager: [phone]  04/20/2016

## 2016-04-20 NOTE — H&P (View-Only) (Signed)
Physical Medicine and Rehabilitation Admission H&P    Chief Complaint  Patient presents with  . Fall  : HPI: Jillian Morris is a 70 y.o. right handed female with history of fibromyalgia, chronic fatigue syndrome, chronic low back pain. Per chart review patient lives with spouse independently with a cane prior to admission. One level home. Presented 04/16/2016 after ground-level fall in the hallway of her ophthalmologist office. CT of the head showed subarachnoid hemorrhage along the medial right frontal lobe and in the left side of the quadrigeminal plate cistern. Complex fracture involving the lateral inferior walls of the right orbit. Fractures to the anterior posterior superior inferior and lateral walls of the right maxillary sinus. Right zygomatic arch fracture. CT cervical spine negative. Noted also findings of right comminuted dorsally impacted fracture of the distal radius with dorsal angulation of the distal radial articular surface. Dr. Venetia MaxonStern for follow-up in [facility] advise conservative care. Follow-up cranial CT scan stable. Dr. Merlyn LotKuzma follow-up no recommendations for surgical intervention at this time of right radius fracture, will plan as outpatient. Currently with right upper extremity sling. Hospital course pain management. Physical occupational therapy evaluations completed with recommendations of physical medicine rehabilitation consult.Patient was admitted for a comprehensive rehabilitation program  ROS Constitutional: Positive for malaise/fatigue. Negative for chills and fever.  Eyes: Negative for blurred vision and double vision.  Respiratory: Negative for cough and shortness of breath.   Cardiovascular: Positive for leg swelling. Negative for chest pain and palpitations.  Gastrointestinal: Positive for constipation. Negative for nausea and vomiting.       GERD  Genitourinary: Negative for dysuria and hematuria.  Musculoskeletal: Positive for back pain and falls.    Skin: Negative for rash.  Neurological: Positive for headaches. Negative for seizures.  Psychiatric/Behavioral: Positive for depression.       Anxiety  All other systems reviewed and are negative   Past Medical History:  Diagnosis Date  . Acne rosacea   . Anemia    s/p gastric bypass, malabsorption  . Anxiety   . Arthritis   . B12 deficiency   . Chronic fatigue fibromyalgia syndrome   . Chronic low back pain   . Depression   . Fibromyalgia   . GERD (gastroesophageal reflux disease)   . Graves disease   . H/O hiatal hernia   . Hypothyroidism   . IBS (irritable bowel syndrome)   . Lymphedema   . Meralgia paraesthetica   . Sjogren's syndrome (HCC)   . Status post dilation of esophageal narrowing   . Stroke [facility](HCC)    Past Surgical History:  Procedure Laterality Date  . ABDOMINAL HYSTERECTOMY    . APPENDECTOMY    . BALLOON DILATION  03/30/2012   Procedure: BALLOON DILATION;  Surgeon: Louis Meckelobert D Kaplan, MD;  Location: [facility];  Service: Endoscopy;  Laterality: N/A;  . BOTOX INJECTION N/A 12/26/2014   Procedure: BOTOX INJECTION;  Surgeon: Louis Meckelobert D Kaplan, MD;  Location: [facility];  Service: Endoscopy;  Laterality: N/A;  . ENDOMETRIAL ABLATION     x 4  . ESOPHAGEAL MANOMETRY N/A 05/15/2013   Procedure: ESOPHAGEAL MANOMETRY (EM);  Surgeon: Louis Meckelobert D Kaplan, MD;  Location: [facility];  Service: Endoscopy;  Laterality: N/A;  . ESOPHAGOGASTRODUODENOSCOPY  03/30/2012   Procedure: ESOPHAGOGASTRODUODENOSCOPY (EGD);  Surgeon: Louis Meckelobert D Kaplan, MD;  [facility];  Service: Endoscopy;  Laterality: N/A;  . ESOPHAGOGASTRODUODENOSCOPY  05/04/2012   Procedure: ESOPHAGOGASTRODUODENOSCOPY (EGD);  Surgeon: Louis Meckelobert D Kaplan, MD;  Location: [facility];  Service: Endoscopy;  Laterality: N/A;  . ESOPHAGOGASTRODUODENOSCOPY N/A 12/26/2014   Procedure: ESOPHAGOGASTRODUODENOSCOPY (EGD);  Surgeon: Louis Meckel, MD;  [facility];  Service: Endoscopy;  Laterality: N/A;  botox  injection  . GASTRIC BYPASS    . LAMINECTOMY     L3-L5  . ORIF TRIPOD FRACTURE Right 04/19/2016   Procedure: OPEN REDUCTION INTERNAL FIXATION (ORIF) TRIPOD FRACTURE LIEBINGER MIDFACE NO DRILL;  Surgeon: Newman Pies, MD;  Location: [facility] OR;  Service: ENT;  Laterality: Right;  . 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 THYROIDECTOMY     Family History  Problem Relation Age of Onset  . Prostate cancer Maternal Grandfather   . Lymphoma Maternal Uncle   . Heart disease Father   . Heart disease Sister   . Irritable bowel syndrome Sister   . Dementia Mother   . Stroke Maternal Grandmother   . Appendicitis Paternal Grandmother   . Colon cancer Neg Hx    Social History:  reports that she has never smoked. She has never used smokeless tobacco. She reports that she does not drink alcohol or use drugs. Allergies:  Allergies  Allergen Reactions  . Actonel [Risedronate Sodium]     GI Upset  . Ambien [Zolpidem Tartrate] Other (See Comments)    Went crazy, hallucinations  . Augmentin [Amoxicillin-Pot Clavulanate] Other (See Comments)    GI upset  . Depakote [Divalproex Sodium]     Hallucinations  . Inderal [Propranolol] Other (See Comments)    unknown  . Nsaids Other (See Comments)    Pt. Had gastric bypass surgery   . Penicillins Other (See Comments)    Unknown childhood reaction  . Silicon Nausea And Vomiting    Thyroid storm  . Adhesive [Tape] Itching and Rash    Please use "paper" tape  . Latex Swelling and Rash    Redness lips  . Nickel Itching and Rash    Other reaction(s): Contact Dermatitis (intolerance)   Medications Prior to Admission  Medication Sig Dispense Refill  . ALPRAZolam (XANAX) 0.25 MG tablet Take 0.25 mg by mouth 2 (two) times daily as needed (for panic attacks).     . cyanocobalamin (,VITAMIN B-12,) 1000 MCG/ML injection Inject 1,000 mcg into the muscle every 30 (thirty) days.     . DULoxetine (CYMBALTA) 60 MG capsule Take 60 mg by mouth 2 (two) times daily.    Marland Kitchen  lamoTRIgine (LAMICTAL) 100 MG tablet Take 300 mg by mouth at bedtime.    . lidocaine (LIDODERM) 5 % Place 1 patch onto the skin daily. Remove & Discard patch within 12 hours or as directed by MD    . LORazepam (ATIVAN) 1 MG tablet Take 1 mg by mouth at bedtime.     . metroNIDAZOLE (METROCREAM) 0.75 % cream Apply 1 application topically 2 (two) times daily.     Marland Kitchen morphine (MS CONTIN) 30 MG 12 hr tablet Take 30 mg by mouth every 12 (twelve) hours.     . mupirocin cream (BACTROBAN) 2 % 1 application.    Marland Kitchen oxymorphone (OPANA) 10 MG tablet Take 10 mg by mouth every 6 (six) hours as needed for pain.    . ranitidine (ZANTAC) 300 MG tablet Take 300 mg by mouth at bedtime.     Marland Kitchen rOPINIRole (REQUIP) 1 MG tablet 1 TABLET (1 mg) BEFORE BEDTIME as needed for restless leg syndrome  6  . SYNTHROID 125 MCG tablet TAKE 1 TABLET (125 mcg) BY MOUTH EVERY MORNING ON EMPTY STOMACH  ONCE DAILY  3  . dexlansoprazole (DEXILANT) 60 MG capsule TAKE 1 CAPSULE (60 MG TOTAL) BY MOUTH DAILY. (Patient not taking: Reported on 04/16/2016) 90 capsule 3    Home: Home Living Family/patient expects to be discharged to:: Private residence Living Arrangements: Spouse/significant other Available Help at Discharge: Family Type of Home: House Home Access: Level entry Home Layout: One level Bathroom Shower/Tub:  (sponge bathes) Bathroom Toilet: Handicapped height (one standard) Home Equipment: Cane - single point  Lives With: Spouse   Functional History: Prior Function Level of Independence: Independent with assistive device(s) Comments: SPC for mobility   Functional Status:  Mobility: Bed Mobility Overal bed mobility: Needs Assistance Bed Mobility: Supine to Sit Supine to sit: Min guard General bed mobility comments: min guard for safety Transfers Overall transfer level: Needs assistance Equipment used: 1 person hand held assist Transfers: Sit to/from Stand Sit to Stand: Min assist General transfer comment: min  assist for balance and stability when elevating to upright Ambulation/Gait Ambulation/Gait assistance: Mod assist Ambulation Distance (Feet): 90 Feet Assistive device: 1 person hand held assist Gait Pattern/deviations: Step-through pattern, Decreased stride length, Shuffle, Narrow base of support General Gait Details: patient very unsteady with ambulation, also required assist for safety due to visual deficits and balance deficits related to LE weakness and limited RUE altering overall gait Gait velocity: decreased Gait velocity interpretation: Below normal speed for age/gender    ADL: ADL Overall ADL's : Needs assistance/impaired Eating/Feeding: Set up, Sitting Grooming: Minimal assistance, Sitting Upper Body Bathing: Minimal assitance, Sitting Lower Body Bathing: Minimal assistance, Sit to/from stand Upper Body Dressing : Minimal assistance, Sitting Upper Body Dressing Details (indicate cue type and reason): for donning gown Lower Body Dressing: Minimal assistance, Sit to/from stand Lower Body Dressing Details (indicate cue type and reason): Min assist to don socks sitting EOB. Pt able to cross foot over opposite knee to assist with donning socks. Toilet Transfer: Moderate assistance, Ambulation, BSC, +2 for safety/equipment Toilet Transfer Details (indicate cue type and reason): Simulated by sit to stand from EOB with functional mobility in room. Toileting- Clothing Manipulation and Hygiene: Minimal assistance, Sit to/from stand Functional mobility during ADLs: Moderate assistance, +2 for safety/equipment (1 person hand held assist) General ADL Comments: Pt reports intermittent confusion over the past few weeks; can identify two instances when she was out driving and got lost/"turned around".   Cognition: Cognition Overall Cognitive Status: Impaired/Different from baseline Arousal/Alertness: Awake/alert Orientation Level: Oriented X4 Attention: Sustained Sustained Attention:  Impaired Sustained Attention Impairment: Verbal basic Memory: Impaired Memory Impairment: Decreased recall of new information, Retrieval deficit Awareness: Impaired Awareness Impairment: Intellectual impairment, Emergent impairment, Anticipatory impairment Problem Solving: Impaired Problem Solving Impairment: Verbal basic Safety/Judgment: Impaired Cognition Arousal/Alertness: Awake/alert Behavior During Therapy: Flat affect, Impulsive Overall Cognitive Status: Impaired/Different from baseline Area of Impairment: Attention, Memory, Following commands, Safety/judgement, Awareness, Problem solving Current Attention Level: Selective Memory: Decreased short-term memory Following Commands: Follows one step commands with increased time Safety/Judgement: Decreased awareness of safety, Decreased awareness of deficits Awareness: Emergent Problem Solving: Slow processing, Decreased initiation, Difficulty sequencing, Requires verbal cues, Requires tactile cues General Comments: patient tangential throughout session, also acknowledges some various degress of baseline confusion over the past several weeks. Patient indicated that she started using a cane because she 'got lost driving to her hair appointment' and does not comprehend while this correlation does not appear fitting  Physical Exam: Blood pressure 127/65, pulse 84, temperature 97.8 F (36.6 C), temperature source Oral, resp. rate 16, height 5\' 2"  (  1.575 m), weight 85.7 kg (188 lb 15 oz), SpO2 98 %. Physical Exam Gen: NAD. Well-developed. Vital signs reviewed. HENT:  Multiple bruising to the face . Injected right sclera. Neck: Normal range of motion. Neck supple. No thyromegaly present.  Cardiovascular: Normal rate and regular rhythm.   Respiratory: Effort normal and breath sounds normal. No respiratory distress.  GI: Soft. Bowel sounds are normal. She exhibits no distension.  Musculoskeletal:  Right upper extremity with sling  B/l LE  edema Neurological: She is alert.  Oriented to person place and time.  Sensation intact to light touch. Motor: LUE: 4+/5 proximal to distal B/l LE: Hip flexion 4-/5, knee extension 4/5, ankle dorsi/plantar flexion 4+/5 Skin: Skin is warm and dry   Medical Problem List and Plan: 1. TBI/SAH/right distal radius fracture, right maxillary right zygomatic arch fracture  secondary to fall 04/16/2016  Extensive discussion with family regarding IRF and prognosis and future interventions 2.  DVT Prophylaxis/Anticoagulation: SCDs. Monitor for any signs of DVT 3. Pain Management/Fibromyalgia: MS Contin 30 mg every 12 hours, MSIR 30 mg every 6 hours as needed 4. Mood: Cymbalta 60 mg twice a day, Ativan 1 mg daily at bedtime, Requip 1 mg daily at bedtime as needed, Xanax 0.25 mg twice daily as needed. 5. Neuropsych: This patient is capable of making decisions on her own behalf. 6. Skin/Wound Care: Routine skin checks 7. Fluids/Electrolytes/Nutrition: Routine I&O's with follow-up chemistries 8. Right comminuted dorsally impacted fracture of the distal radius. No plan for surgical intervention at this time. Currently nonweightbearing. Follow-up per Dr. Merlyn Lot. 9. Right maxillary zygomatic arch fracture. Follow-up per Dr. Suszanne Conners with conservative care   Post Admission Physician Evaluation: 1. Functional deficits secondary  to TBI/SAH/right distal radius fracture, right maxillary right zygomatic arch fracture. 2. Patient is admitted to receive collaborative, interdisciplinary care between the physiatrist, rehab nursing staff, and therapy team. 3. Patient's level of medical complexity and substantial therapy needs in context of that medical necessity cannot be provided at a lesser intensity of care such as a SNF. 4. Patient has experienced substantial functional loss from his/her baseline which was documented above under the "Functional History" and "Functional Status" headings.  Judging by the patient's  diagnosis, physical exam, and functional history, the patient has potential for functional progress which will result in measurable gains while on inpatient rehab.  These gains will be of substantial and practical use upon discharge  in facilitating mobility and self-care at the household level. 5. Physiatrist will provide 24 hour management of medical needs as well as oversight of the therapy plan/treatment and provide guidance as appropriate regarding the interaction of the two. 6. 24 hour rehab nursing will assist with bowel management, safety, skin/wound care, disease management, pain management and patient education  and help integrate therapy concepts, techniques,education, etc. 7. PT will assess and treat for/with: Lower extremity strength, range of motion, stamina, balance, functional mobility, safety, adaptive techniques and equipment, woundcare, coping skills, pain control, education.   Goals are: Supervision 8. OT will assess and treat for/with: ADL's, functional mobility, safety, upper extremity strength, adaptive techniques and equipment, wound mgt, ego support, and community reintegration.   Goals are: Supervision/Mod I. Therapy may not proceed with showering this patient. 9. Case Management and Social Worker will assess and treat for psychological issues and discharge planning. 10. Team conference will be held weekly to assess progress toward goals and to determine barriers to discharge. 11. Patient will receive at least 3 hours of therapy per day at least  5 days per week. 12. ELOS: 14-17 days.       13. Prognosis:  good    Maryla Morrow, MD 04/20/2016

## 2016-04-20 NOTE — Clinical Social Work Note (Signed)
Clinical Social Worker continuing to follow patient and family for support and discharge planning needs.  Patient has been accepted to inpatient rehab for admission today.  Insurance authorization obtained and patient and family in agreement.  Clinical Social Worker will sign off for now as social work intervention is no longer needed. Please consult us again if new need arises.  Macario GoldsJesse Carmita Boom, [place] [phone]

## 2016-04-21 ENCOUNTER — Inpatient Hospital Stay (HOSPITAL_COMMUNITY): Payer: Medicare Other | Admitting: Physical Therapy

## 2016-04-21 ENCOUNTER — Inpatient Hospital Stay (HOSPITAL_COMMUNITY): Payer: Medicare Other | Admitting: Occupational Therapy

## 2016-04-21 ENCOUNTER — Inpatient Hospital Stay (HOSPITAL_COMMUNITY): Payer: Medicare Other | Admitting: Speech Pathology

## 2016-04-21 DIAGNOSIS — S069X3S Unspecified intracranial injury with loss of consciousness of 1 hour to 5 hours 59 minutes, sequela: Secondary | ICD-10-CM

## 2016-04-21 DIAGNOSIS — S52509S Unspecified fracture of the lower end of unspecified radius, sequela: Secondary | ICD-10-CM

## 2016-04-21 DIAGNOSIS — R269 Unspecified abnormalities of gait and mobility: Secondary | ICD-10-CM

## 2016-04-21 LAB — COMPREHENSIVE METABOLIC PANEL
ALT: 9 U/L — ABNORMAL LOW (ref 14–54)
AST: 15 U/L (ref 15–41)
Albumin: 3 g/dL — ABNORMAL LOW (ref 3.5–5.0)
Alkaline Phosphatase: 70 U/L (ref 38–126)
Anion gap: 7 (ref 5–15)
BUN: 7 mg/dL (ref 6–20)
CO2: 29 mmol/L (ref 22–32)
Calcium: 8.9 mg/dL (ref 8.9–10.3)
Chloride: 106 mmol/L (ref 101–111)
Creatinine, Ser: 0.85 mg/dL (ref 0.44–1.00)
GFR calc Af Amer: >60 mL/min (ref >60)
GFR calc non Af Amer: >60 mL/min (ref >60)
Glucose, Bld: 99 mg/dL (ref 65–99)
Potassium: 3.8 mmol/L (ref 3.5–5.1)
Sodium: 142 mmol/L (ref 135–145)
Total Bilirubin: 0.6 mg/dL (ref 0.3–1.2)
Total Protein: 5 g/dL — ABNORMAL LOW (ref 6.5–8.1)

## 2016-04-21 LAB — CBC WITH DIFFERENTIAL/PLATELET
Basophils Absolute: 0 10*3/uL (ref 0.0–0.1)
Basophils Relative: 1 %
Eosinophils Absolute: 0.2 10*3/uL (ref 0.0–0.7)
Eosinophils Relative: 5 %
HCT: 30.7 % — ABNORMAL LOW (ref 36.0–46.0)
Hemoglobin: 10 g/dL — ABNORMAL LOW (ref 12.0–15.0)
Lymphocytes Relative: 36 %
Lymphs Abs: 1.6 10*3/uL (ref 0.7–4.0)
MCH: 30 pg (ref 26.0–34.0)
MCHC: 32.6 g/dL (ref 30.0–36.0)
MCV: 92.2 fL (ref 78.0–100.0)
Monocytes Absolute: 0.3 10*3/uL (ref 0.1–1.0)
Monocytes Relative: 6 %
Neutro Abs: 2.3 10*3/uL (ref 1.7–7.7)
Neutrophils Relative %: 52 %
Platelets: 157 10*3/uL (ref 150–400)
RBC: 3.33 MIL/uL — ABNORMAL LOW (ref 3.87–5.11)
RDW: 12.8 % (ref 11.5–15.5)
WBC: 4.4 10*3/uL (ref 4.0–10.5)

## 2016-04-21 NOTE — Plan of Care (Signed)
Problem: RH Balance Goal: LTG Patient will maintain dynamic standing balance (PT) LTG:  Patient will maintain dynamic standing balance with assistance during mobility activities (PT)  With LRAD  Problem: RH Bed Mobility Goal: LTG Patient will perform bed mobility with assist (PT) LTG: Patient will perform bed mobility with assistance, with/without cues (PT).  Traditional bed  Problem: RH Bed to Chair Transfers Goal: LTG Patient will perform bed/chair transfers w/assist (PT) LTG: Patient will perform bed/chair transfers with assistance, with/without cues (PT).  With LRAD  Problem: RH Car Transfers Goal: LTG Patient will perform car transfers with assist (PT) LTG: Patient will perform car transfers with assistance (PT).  With LRAD  Problem: RH Furniture Transfers Goal: LTG Patient will perform furniture transfers w/assist (OT/PT LTG: Patient will perform furniture transfers  with assistance (OT/PT).  With LRAD  Problem: RH Floor Transfers Goal: LTG Patient will perform floor transfers w/assist (PT) LTG: Patient will perform floor transfers with assistance (PT).  With LRAD  Problem: RH Ambulation Goal: LTG Patient will ambulate in controlled environment (PT) LTG: Patient will ambulate in a controlled environment, # of feet with assistance (PT).  150 ft with LRAD Goal: LTG Patient will ambulate in home environment (PT) LTG: Patient will ambulate in home environment, # of feet with assistance (PT).  50 ft with LRAD Goal: LTG Patient will ambulate in community environment (PT) LTG: Patient will ambulate in community environment, # of feet with assistance (PT).  150 ft with LRAD  Problem: RH Stairs Goal: LTG Patient will ambulate up and down stairs w/assist (PT) LTG: Patient will ambulate up and down # of stairs with assistance (PT)  8 steps (6") with B rails for strengthening

## 2016-04-21 NOTE — Progress Notes (Signed)
Physical Therapy Session Note  Patient Details  Name: KIZZI OVERBEY MRN: [id number] Date of Birth: 1946-08-20  Today's Date: 04/21/2016 PT Individual Time: 7990-9400 PT Individual Time Calculation (min): 25 min    Short Term Goals: Week 1:  PT Short Term Goal 1 (Week 1): STG = LTG due to short ELOS.  Skilled Therapeutic Interventions/Progress Updates:  Pt received resting in w/c with c/o soreness all over but reports being premedicated, agreeable to therapy session but requesting to return to bed at completion of session.  Session focus on ambulation with SPC throughout unit, max distance 150' and gait on compliant surfaces with SPC.  Pt requires steady assist > close supervision for gait, noted occasional LOB to R which pt was able to recover with steady assist.  Pt returned to room at end of session and positioned in bed with supervision and more than a reasonable amount of time.  Pt left with call bell in reach and needs met.   Therapy Documentation Precautions:  Precautions Precautions: Fall Required Braces or Orthoses: Sling Restrictions Weight Bearing Restrictions: Yes RUE Weight Bearing: Non weight bearing Other Position/Activity Restrictions: sling for comfort   See Function Navigator for Current Functional Status.   Therapy/Group: Individual Therapy  Earnest Conroy Penven-Crew 04/21/2016, 4:31 PM

## 2016-04-21 NOTE — Evaluation (Signed)
Speech Language Pathology Assessment and Plan  Patient Details  Name: Jillian Morris MRN: [id number] Date of Birth: 1946-06-16  SLP Diagnosis: Cognitive Impairments  Rehab Potential: Good ELOS: 9/9   Today's Date: 04/21/2016 SLP Individual Time: 1000-1100 SLP Individual Time Calculation (min): 60 min    Problem List: Patient Active Problem List   Diagnosis Date Noted  . Fall 04/20/2016  . Multiple facial fractures ([place]) 04/20/2016  . Right wrist fracture 04/20/2016  . SAH (subarachnoid hemorrhage) ([place])   . Distal radial fracture   . Closed fracture of zygomatic arch ([place])   . Depression   . Fibromyalgia   . Traumatic subarachnoid hemorrhage ([place]) 04/16/2016  . Achalasia 08/09/2013  . Stricture and stenosis of esophagus 03/30/2012  . Dysphagia, unspecified(787.20) 03/21/2012  . Special screening for malignant neoplasms, colon 03/21/2012  . Esophageal reflux 03/21/2012   Past Medical History:  Past Medical History:  Diagnosis Date  . Acne rosacea   . Anemia    s/p gastric bypass, malabsorption  . Anxiety   . Arthritis   . B12 deficiency   . Chronic fatigue fibromyalgia syndrome   . Chronic low back pain   . Depression   . Fibromyalgia   . GERD (gastroesophageal reflux disease)   . Graves disease   . H/O hiatal hernia   . Hypothyroidism   . IBS (irritable bowel syndrome)   . Lymphedema   . Meralgia paraesthetica   . Sjogren's syndrome ([place])   . Status post dilation of esophageal narrowing   . Stroke [facility])    Past Surgical History:  Past Surgical History:  Procedure Laterality Date  . ABDOMINAL HYSTERECTOMY    . APPENDECTOMY    . BALLOON DILATION  03/30/2012   Procedure: BALLOON DILATION;  Surgeon: Inda Castle, MD;  Location: [facility] ENDOSCOPY;  Service: Endoscopy;  Laterality: N/A;  . BOTOX INJECTION N/A 12/26/2014   Procedure: BOTOX INJECTION;  Surgeon: Inda Castle, MD;  Location: [facility]OS[facility];  Service: Endoscopy;  Laterality: N/A;  . ENDOMETRIAL  ABLATION     x 4  . ESOPHAGEAL MANOMETRY N/A 05/15/2013   Procedure: ESOPHAGEAL MANOMETRY (EM);  Surgeon: Inda Castle, MD;  [facility]: [facility];  Service: Endoscopy;  Laterality: N/A;  . ESOPHAGOGASTRODUODENOSCOPY  03/30/2012   Procedure: ESOPHAGOGASTRODUODENOSCOPY (EGD);  Surgeon: Inda Castle, MD;  [facility];  Service: Endoscopy;  Laterality: N/A;  . ESOPHAGOGASTRODUODENOSCOPY  05/04/2012   Procedure: ESOPHAGOGASTRODUODENOSCOPY (EGD);  Surgeon: Inda Castle, MD;  Location: [facility];  Service: Endoscopy;  Laterality: N/A;  . ESOPHAGOGASTRODUODENOSCOPY N/A 12/26/2014   Procedure: ESOPHAGOGASTRODUODENOSCOPY (EGD);  Surgeon: Inda Castle, MD;  Location: [facility];  Service: Endoscopy;  Laterality: N/A;  botox injection  . GASTRIC BYPASS    . LAMINECTOMY     L3-L5  . ORIF TRIPOD FRACTURE Right 04/19/2016   Procedure: OPEN REDUCTION INTERNAL FIXATION (ORIF) TRIPOD FRACTURE LIEBINGER MIDFACE NO DRILL;  Surgeon: Leta Baptist, MD;  Location: [place];  Service: ENT;  Laterality: Right;  . 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 THYROIDECTOMY      Assessment / Plan / Recommendation Clinical Impression Jillian Morris a 70 y.o.right handed femalewith history of fibromyalgia, chronic fatigue syndrome, chronic low back pain. Per chart review patient lives with spouse independently with a cane prior to admission. One level home. Presented 04/16/2016 after ground-level fall in the hallway of her ophthalmologist office. CT of the head showed subarachnoid hemorrhage in the medial right frontal lobe and  in the left side of the quadrigeminal plate cistern. Complex fracture involving the lateral inferior walls of the right orbit. Fractures to the anterior posterior superior inferior and lateral walls of the right maxillary sinus. Right zygomatic arch fracture. CT cervical spine negative. Noted also findings of right comminuted dorsally impacted fracture of the distal radius with  dorsal angulation of the distal radial articular surface. Dr. Vertell Limber for follow-up in regards to [facility] advise conservative care. Follow-up cranial CT scan stable. Await recommendations of right distal radius fracture. Currently with right upper extremity sling. Hospital course pain management. Physical occupational therapy evaluations completed with recommendations of physical medicine rehabilitation consult and patient admitted 04/20/16.   Patient demonstrates skills consistent with a score of 19/22 on the [facility] Cognitive Assessment,7.1-blind with 18 or greater being considered to be within normal limits.  However, in functional tasks she  demonstrates mild-moderate cognitive impairments characterized by impaired sustained attention, retrieval of new information, and emergent awareness of deficits, which impact overall safety with basic problem solving during self-care tasks.  Patient would benefit from skilled SLP intervention in order to maximize their functional independence prior to discharge. Anticipate patient will require 24 hour supervision at home and follow up SLP services.    Skilled Therapeutic Interventions          Skilled treatment initiated during session with focus on addressing cognition goals. SLP facilitated session by providing Mod assist verbal cues for redirection to various task items, as well as frequent repeats to allow for completion of tasks.  Patient also required Max assist multimodal cues to identify cognitive deficits.  Continue with current plan of care.    SLP Assessment  Patient will need skilled [facility] during CIR admission    Recommendations  SLP Diet Recommendations:  (soft per MD) Oral Care Recommendations: Oral care BID Recommendations for Other Services: Neuropsych consult Patient destination: Home Follow up Recommendations: 24 hour supervision/assistance;Outpatient SLP Equipment Recommended: None recommended by SLP    SLP  Frequency 3 to 5 out of 7 days   SLP Duration  SLP Intensity  SLP Treatment/Interventions 9/9  Minumum of 1-2 x/day, 30 to 90 minutes  Cognitive remediation/compensation;Cueing hierarchy;Environmental controls;Functional tasks;Internal/external aids;Medication managment;Patient/family education    Pain Pain Assessment Pain Assessment: No/denies pain  Prior Functioning Cognitive/Linguistic Baseline: Baseline deficits Baseline deficit details: pt reports some trouble with memory PTA but was previously evaluated and told that her cognition was [facility] Type of Home: House  Lives With: Spouse Available Help at Discharge: Family;Available 24 hours/day Education: grad school  Vocation: Retired  Function:   Cognition Comprehension Comprehension assist level: Follows basic conversation/direction with extra time/assistive device  Expression   Expression assist level: Expresses basic needs/ideas: With no assist  Social Interaction Social Interaction assist level: Interacts appropriately 50 - 74% of the time - May be physically or verbally inappropriate.  Problem Solving Problem solving assist level: Solves basic 90% of the time/requires cueing < 10% of the time  Memory Memory assist level: Recognizes or recalls 75 - 89% of the time/requires cueing 10 - 24% of the time   Short Term Goals: Week 1: SLP Short Term Goal 1 (Week 1): Patient will sustian attention to functional task for 10 minutes with Supervision level verbal cues SLP Short Term Goal 2 (Week 1): Patient will utilize external aids to assist with recall of new and daily information with Min verbal cues SLP Short Term Goal 3 (Week 1): Patient will label 2 cognitive change with Min question  cues  SLP Short Term Goal 4 (Week 1): Patient will request help an needed with Min assist verbal cues SLP Short Term Goal 5 (Week 1): Patient will solve mildly complex problems with Min assist verbal and visual cues to recognize and coorect  errors  Refer to Care Plan for Long Term Goals  Recommendations for other services: Neuropsych  Discharge Criteria: Patient will be discharged from SLP if patient refuses treatment 3 consecutive times without medical reason, if treatment goals not met, if there is a change in medical status, if patient makes no progress towards goals or if patient is discharged from hospital.  The above assessment, treatment plan, treatment alternatives and goals were discussed and mutually agreed upon: by patient and by family  Carmelia Roller., CCC-SLP [phone]  [place] 04/21/2016, 8:56 PM

## 2016-04-21 NOTE — Evaluation (Signed)
Occupational Therapy Assessment and Plan  Patient Details  Name: Jillian Morris MRN: [id number] Date of Birth: June 30, 1946  OT Diagnosis: acute pain, cognitive deficits, disturbance of vision, muscle weakness (generalized), pain in joint and swelling of limb Rehab Potential: Rehab Potential (ACUTE ONLY): Good ELOS: 10-12 days   Today's Date: 04/21/2016 OT Individual Time: 1300-1400 OT Individual Time Calculation (min): 60 min      Problem List:  Patient Active Problem List   Diagnosis Date Noted  . Fall 04/20/2016  . Multiple facial fractures ([place]) 04/20/2016  . Right wrist fracture 04/20/2016  . SAH (subarachnoid hemorrhage) ([place])   . Distal radial fracture   . Closed fracture of zygomatic arch ([place])   . Depression   . Fibromyalgia   . Traumatic subarachnoid hemorrhage ([place]) 04/16/2016  . Achalasia 08/09/2013  . Stricture and stenosis of esophagus 03/30/2012  . Dysphagia, unspecified(787.20) 03/21/2012  . Special screening for malignant neoplasms, colon 03/21/2012  . Esophageal reflux 03/21/2012    Past Medical History:  Past Medical History:  Diagnosis Date  . Acne rosacea   . Anemia    s/p gastric bypass, malabsorption  . Anxiety   . Arthritis   . B12 deficiency   . Chronic fatigue fibromyalgia syndrome   . Chronic low back pain   . Depression   . Fibromyalgia   . GERD (gastroesophageal reflux disease)   . Graves disease   . H/O hiatal hernia   . Hypothyroidism   . IBS (irritable bowel syndrome)   . Lymphedema   . Meralgia paraesthetica   . Sjogren's syndrome ([place])   . Status post dilation of esophageal narrowing   . Stroke [facility])    Past Surgical History:  Past Surgical History:  Procedure Laterality Date  . ABDOMINAL HYSTERECTOMY    . APPENDECTOMY    . BALLOON DILATION  03/30/2012   Procedure: BALLOON DILATION;  Surgeon: Inda Castle, MD;  [facility];  Service: Endoscopy;  Laterality: N/A;  . BOTOX INJECTION N/A 12/26/2014   Procedure:  BOTOX INJECTION;  Surgeon: Inda Castle, MD;  [facility];  Service: Endoscopy;  Laterality: N/A;  . ENDOMETRIAL ABLATION     x 4  . ESOPHAGEAL MANOMETRY N/A 05/15/2013   Procedure: ESOPHAGEAL MANOMETRY (EM);  Surgeon: Inda Castle, MD;  [facility];  Service: Endoscopy;  Laterality: N/A;  . ESOPHAGOGASTRODUODENOSCOPY  03/30/2012   Procedure: ESOPHAGOGASTRODUODENOSCOPY (EGD);  Surgeon: Inda Castle, MD;  [facility];  Service: Endoscopy;  Laterality: N/A;  . ESOPHAGOGASTRODUODENOSCOPY  05/04/2012   Procedure: ESOPHAGOGASTRODUODENOSCOPY (EGD);  Surgeon: Inda Castle, MD;  [facility];  Service: Endoscopy;  Laterality: N/A;  . ESOPHAGOGASTRODUODENOSCOPY N/A 12/26/2014   Procedure: ESOPHAGOGASTRODUODENOSCOPY (EGD);  Surgeon: Inda Castle, MD;  Location[facility];  Service: Endoscopy;  Laterality: N/A;  botox injection  . GASTRIC BYPASS    . LAMINECTOMY     L3-L5  . ORIF TRIPOD FRACTURE Right 04/19/2016   Procedure: OPEN REDUCTION INTERNAL FIXATION (ORIF) TRIPOD FRACTURE LIEBINGER MIDFACE NO DRILL;  Surgeon: Leta Baptist, MD;  Location: [place];  Service: ENT;  Laterality: Right;  . 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 THYROIDECTOMY      Assessment & Plan Clinical Impression: Jillian Morris a 70 y.o.right handed femalewith history of fibromyalgia, chronic fatigue syndrome, chronic low back pain. Per chart review patient lives with spouse independently with a cane prior to admission. One level home. Presented 04/16/2016 after ground-level fall in  the hallway of her ophthalmologist office. CT of the head showed subarachnoid hemorrhage along the medial right frontal lobe and in the left side of the quadrigeminal plate cistern. Complex fracture involving the lateral inferior walls of the right orbit. Fractures to the anterior posterior superior inferior and lateral walls of the right maxillary sinus. Right zygomatic arch fracture. CT cervical  spine negative. Noted also findings of right comminuted dorsally impacted fracture of the distal radius with dorsal angulation of the distal radial articular surface. Dr. Vertell Limber for follow-up in regards to [facility] advise conservative care. Follow-up cranial CT scan stable. Dr. Fredna Dow follow-up no recommendations for surgical intervention at this time of right radius fracture, will plan as outpatient. Currently with right upper extremity sling. Hospital course pain management. Physical occupational therapy evaluations completed with recommendations of physical medicine rehabilitation consult.Patient was admitted for a comprehensive rehabilitation program Patient transferred to CIR on 04/20/2016 .    Patient currently requires mod with basic self-care skills secondary to muscle weakness, decreased cardiorespiratoy endurance, decreased awareness, decreased problem solving, decreased safety awareness, decreased memory and delayed processing and decreased sitting balance, decreased standing balance, decreased postural control and decreased balance strategies.  Prior to hospitalization, patient could complete ADLs/ IADLs with modified independent .  Patient will benefit from skilled intervention to decrease level of assist with basic self-care skills, increase independence with basic self-care skills and increase level of independence with iADL prior to discharge home with care partner.  Anticipate patient will require 24 hour supervision and follow up home health.  OT - End of Session Activity Tolerance: Tolerates 30+ min activity with multiple rests Endurance Deficit: Yes Endurance Deficit Description: Requires rest breaks throughout ADL bathing/dressing session.  OT Assessment Rehab Potential (ACUTE ONLY): Good OT Patient demonstrates impairments in the following area(s): Balance;Behavior;Safety;Cognition;Edema;Endurance;Pain OT Basic ADL's Functional Problem(s): Eating;Grooming;Bathing;Dressing;Toileting OT  Advanced ADL's Functional Problem(s): Simple Meal Preparation OT Transfers Functional Problem(s): Toilet;Tub/Shower OT Additional Impairment(s): Fuctional Use of Upper Extremity OT Plan OT Intensity: Minimum of 1-2 x/day, 45 to 90 minutes OT Frequency: 5 out of 7 days OT Duration/Estimated Length of Stay: 10-12 days OT Treatment/Interventions: Balance/vestibular training;Cognitive remediation/compensation;Community reintegration;DME/adaptive equipment instruction;Discharge planning;Functional mobility training;Psychosocial support;Patient/family education;Pain management;Self Care/advanced ADL retraining;Splinting/orthotics;UE/LE Strength taining/ROM;Therapeutic Exercise;Therapeutic Activities;UE/LE Coordination activities;Skin care/wound managment;Visual/perceptual remediation/compensation OT Self Feeding Anticipated Outcome(s): Set-up OT Basic Self-Care Anticipated Outcome(s): Supervision OT Toileting Anticipated Outcome(s): Supervision OT Bathroom Transfers Anticipated Outcome(s): Supervision OT Recommendation Recommendations for Other Services: Neuropsych consult Patient destination: Home Follow Up Recommendations: Home health OT Equipment Recommended: To be determined;Tub/shower bench   Skilled Therapeutic Intervention Pt seen for OT eval and ADL bathing/dressing session. Pt sitting up in w/c upon arrival, agreeable to tx session. Eval completed and pt completed bathing/dressing from w/c level at sink. Required increased assist due to impaired R UE.  Pt observed to have some higher level cognitive impairments, requiring repeated VCs and directions throughout session. In ADL apartment, pt completed simulated tub bench transfer with min A and VCs for technique.  Pt returned to room at end of session, left sitting in w/c with quick release belt on and all needs in reach.  Pt's husband present at beginning of session. Reviewed with pt and husband OT goals and recommendation for husband to  bring in bigger t-shirts for easier donning and tennis shoes.   OT Evaluation Precautions/Restrictions  Precautions Precautions: Fall Required Braces or Orthoses: Sling Restrictions Weight Bearing Restrictions: Yes RUE Weight Bearing: Non weight bearing Other Position/Activity Restrictions: sling for comfort General Chart Reviewed:  Yes Pain Pain Assessment Pain Assessment: 0-10 Pain Score: 8  Pain Type: Acute pain Pain Location: Wrist Pain Orientation: Right Pain Descriptors / Indicators: Aching Pain Onset: On-going Pain Intervention(s): Repositioned;Rest; RN aware Home Living/Prior Functioning Home Living Family/patient expects to be discharged to:: Private residence Living Arrangements: Spouse/significant other Available Help at Discharge: Family, Available 24 hours/day Type of Home: House Home Access: Level entry Home Layout: One level Bathroom Shower/Tub: Chiropodist: Handicapped height Bathroom Accessibility: Yes Additional Comments:  (sponge baths PTA 2/2 "balance problems")  Lives With: Spouse IADL History Homemaking Responsibilities: Yes Current License: Yes Mode of Transportation: Musician Occupation: Retired Prior Function Level of Independence: Independent with homemaking with ambulation, Independent with basic ADLs, Requires assistive device for independence (Using cane PTA)  Able to Take Stairs?: No (required assistance, step to pattern) Driving: Yes Vocation: Retired Leisure: Hobbies-yes (Comment) Comments:  (reading) Vision/Perception  Vision- History Baseline Vision/History: Wears glasses Wears Glasses: Reading only Patient Visual Report: Blurring of vision (reports initially having douple vision, however, this has improved and now has general blurring of vision) Vision- Assessment Vision Assessment?: Vision impaired- to be further tested in functional context Additional Comments: Pt reports blurring of vision, however, did not  appear to impact function  Cognition Overall Cognitive Status: No family/caregiver present to determine baseline cognitive functioning Arousal/Alertness: Awake/alert Orientation Level: Person;Place;Situation Person: Oriented Place: Oriented Situation: Oriented Year: 2017 Month: August Day of Week: Correct Memory: Impaired Memory Impairment: Decreased recall of new information;Retrieval deficit Immediate Memory Recall: Sock;Blue;Bed Memory Recall: Sock;Blue;Bed Memory Recall Sock: Without Cue Memory Recall Blue: Without Cue Memory Recall Bed: With Cue Attention: Sustained Focused Attention: Impaired Focused Attention Impairment: Verbal complex;Functional complex Sustained Attention: Impaired Sustained Attention Impairment: Verbal complex;Functional complex Awareness: Impaired Awareness Impairment: Emergent impairment Problem Solving: Impaired Problem Solving Impairment: Verbal complex;Functional complex Safety/Judgment: Impaired Sensation Sensation Light Touch: Appears Intact Proprioception: Appears Intact Coordination Gross Motor Movements are Fluid and Coordinated: No Fine Motor Movements are Fluid and Coordinated: No Coordination and Movement Description: Impaired due to pain and UE injury Motor  Motor Motor: Within Functional Limits Mobility  Bed Mobility Bed Mobility: Supine to Sit Supine to Sit: 5: Supervision Supine to Sit Details: Verbal cues for technique;Verbal cues for sequencing;Verbal cues for precautions/safety Supine to Sit Details (indicate cue type and reason): with use of bed rails & HOB elevated Transfers Sit to Stand: 4: Min assist Sit to Stand Details: Verbal cues for technique;Verbal cues for sequencing Stand to Sit: 4: Min assist Stand to Sit Details (indicate cue type and reason): Verbal cues for technique;Verbal cues for sequencing  Trunk/Postural Assessment  Cervical Assessment Cervical Assessment: Exceptions to [facility] (Flexed) Thoracic  Assessment Thoracic Assessment: Exceptions to [facility] (Kyphotic) Lumbar Assessment Lumbar Assessment: Exceptions to [facility] (Posterior pelvic tilt) Postural Control Postural Control: Within Functional Limits  Balance Balance Balance Assessed: Yes Dynamic Sitting Balance Dynamic Sitting - Balance Support: During functional activity;Feet supported Dynamic Sitting - Level of Assistance: 5: Stand by assistance;4: Min assist Static Standing Balance Static Standing - Balance Support: During functional activity;Right upper extremity supported;Left upper extremity supported Static Standing - Level of Assistance: 4: Min assist Static Standing - Comment/# of Minutes: During standing LB dressing tasks Dynamic Standing Balance Dynamic Standing - Balance Support: During functional activity;Right upper extremity supported;Left upper extremity supported Dynamic Standing - Level of Assistance: 4: Min assist;3: Mod assist Dynamic Standing - Comments: Standing to complete LB dressing/ bathing tasks Extremity/Trunk Assessment RUE Assessment RUE Assessment: Not tested (Not formally assessed due  to UE in elbow cast, pain, and sling. ) LUE Assessment LUE Assessment: Within Functional Limits (4/5 overall; WFL for tasks assessed)   See Function Navigator for Current Functional Status.   Refer to Care Plan for Long Term Goals  Recommendations for other services: Neuropsych  Discharge Criteria: Patient will be discharged from OT if patient refuses treatment 3 consecutive times without medical reason, if treatment goals not met, if there is a change in medical status, if patient makes no progress towards goals or if patient is discharged from hospital.  The above assessment, treatment plan, treatment alternatives and goals were discussed and mutually agreed upon: by patient  Ernestina Patches 04/21/2016, 3:31 PM

## 2016-04-21 NOTE — Plan of Care (Signed)
Problem: RH BOWEL ELIMINATION Goal: RH STG MANAGE BOWEL WITH ASSISTANCE STG Manage Bowel with Min Assistance. Outcome: Progressing Patient arrived to unit with no BM since 8-24. Patient given scheduled medications (colace). Had a successful medium BM and denied PRN sorbitol.  Discussed importance of regular BM with current medication regimen and situation. Will continue to monitor.

## 2016-04-21 NOTE — Patient Care Conference (Signed)
Inpatient RehabilitationTeam Conference and Plan of Care Update Date: 04/22/2016   Time: 2:40 PM    Patient Name: Jillian Morris      Medical Record Number: [id number]  Date of Birth: October 01, 1945 Sex: Female         Room/Bed: 4M05C/4M05C-01 Payor Info: Payor: [employer] MEDICARE / Plan: [facility] MEDICARE / Product Type: *No Product type* /    Admitting Diagnosis: trauma  SAH  Admit Date/Time:  04/20/2016  7:52 PM Admission Comments: No comment available   Primary Diagnosis:  <principal problem not specified> Principal Problem: <principal problem not specified>  Patient Active Problem List   Diagnosis Date Noted  . Fall 04/20/2016  . Multiple facial fractures (HCC) 04/20/2016  . Right wrist fracture 04/20/2016  . Distal radial fracture   . Closed fracture of zygomatic arch (HCC)   . Depression   . Fibromyalgia   . Traumatic subarachnoid hemorrhage (HCC) 04/16/2016  . Achalasia 08/09/2013  . Stricture and stenosis of esophagus 03/30/2012  . Dysphagia, unspecified(787.20) 03/21/2012  . Special screening for malignant neoplasms, colon 03/21/2012  . Esophageal reflux 03/21/2012    Expected Discharge Date: Expected Discharge Date: 05/02/16  Team Members Present: Physician leading conference: Dr. Faith Rogue Social Worker Present: Amada Jupiter, LCSW Nurse Present: Carmie End, RN PT Present: Bayard Hugger, PT OT Present: Ardis Rowan, COTA;Jennifer Katrinka Blazing, OT SLP Present: Feliberto Gottron, SLP PPS Coordinator present : Tora Duck, RN, CRRN     Current Status/Progress Goal Weekly Team Focus  Medical   [facility] due to fall with right radial fx. ?baseline cognitive/balance deficits. sundowning last night.consvt mgt of wristfx  improve balance, re-establish day nights  cognition, pain, nutirtion   Bowel/Bladder   Cont x2; LBM 8-28  Remain cont x2  Continue monitoring for regular BM   Swallow/Nutrition/ Hydration             ADL's   Min- mod A overall  Supervision overall  ADL  re-training; pain management; activity tolerance; cognitive re-training; family ed   Mobility   min A overall  supervision overall   functional mobility training, standing balance, activitity tolerance, cognitive remediation, pt/family education   Communication   Eval pending          Safety/Cognition/ Behavioral Observations  Eval pending          Pain   Scheduled oxycontin, PRN MSIR; primarily pain in R arm  < 4 on 0-10 pain scale  Assess and treat pain as therapy begins   Skin   Scattered bruising along R side (face, neck, arm/hand, hip); sutures to R eye/hairline; soft cast to R arm in sling  No new breakdown while on Rehab  Continue to monitor skin qshift    Rehab Goals Patient on target to meet rehab goals: Yes *See Care Plan and progress notes for long and short-term goals.  Barriers to Discharge: baseline balance issues and cognitive issues    Possible Resolutions to Barriers:  family ed, adaptive equipment    Discharge Planning/Teaching Needs:  Home with spouse who can provide 24/7 assistance.  TEaching to be scheduled.   Team Discussion:  New eval;  Sundowning last evening. Very cognitively disorganized.  Currently min assist overall with supervision goals.  Txs asking if there is a plan for further surgery this week - MD to follow up on this.  Revisions to Treatment Plan:  None   Continued Need for Acute Rehabilitation Level of Care: The patient requires daily medical management by a physician with specialized  training in physical medicine and rehabilitation for the following conditions: Daily direction of a multidisciplinary physical rehabilitation program to ensure safe treatment while eliciting the highest outcome that is of practical value to the patient.: Yes Daily medical management of patient stability for increased activity during participation in an intensive rehabilitation regime.: Yes Daily analysis of laboratory values and/or radiology reports with any  subsequent need for medication adjustment of medical intervention for : Post surgical problems;Neurological problems;Pulmonary problems  Saahil Herbster 04/22/2016, 10:32 AM

## 2016-04-21 NOTE — Progress Notes (Signed)
Patient information reviewed and entered into eRehab system by Tora DuckMarie Keysi Oelkers, RN, CRRN, PPS Coordinator.  Information including medical coding and functional independence measure will be reviewed and updated through discharge.     Per nursing patient and husband given "Data Collection Information Summary for Patients in Inpatient Rehabilitation Facilities with attached "Privacy Act Statement-Health Care Records" upon admission.

## 2016-04-21 NOTE — Progress Notes (Signed)
Late entry.  Subjective: Patient reports pain as moderate.    Objective: Vital signs in last 24 hours: Temp:  [98.4 F (36.9 C)-98.6 F (37 C)] 98.5 F (36.9 C) (08/29 0530) Pulse Rate:  [71-83] 71 (08/29 0530) Resp:  [16] 16 (08/29 0530) BP: (132-145)/(66-68) 132/66 (08/29 0530) SpO2:  [92 %-100 %] 92 % (08/29 0530) Weight:  [85.7 kg (189 lb)] 85.7 kg (189 lb) (08/28 2020)  Intake/Output from previous day: 08/28 0701 - 08/29 0700 In: 720 [P.O.:720] Out: -  Intake/Output this shift: Total I/O In: 960 [P.O.:960] Out: -    Recent Labs  04/21/16 0458  HGB 10.0*    Recent Labs  04/21/16 0458  WBC 4.4  RBC 3.33*  HCT 30.7*  PLT 157    Recent Labs  04/21/16 0458  NA 142  K 3.8  CL 106  CO2 29  BUN 7  CREATININE 0.85  GLUCOSE 99  CALCIUM 8.9   No results for input(s): LABPT, INR in the last 72 hours.  Intact sensation and capillary refill all digits.  Splint intact.  Ecchymosis in digits.  Mild swelling in digits.  Assessment/Plan: Discussed with patient and husband the nature of the injury.  Discussed operative fixation of fracture.  Risks, benefits, and alternatives of surgery were discussed and the patient agrees with the plan of care. Discussed possible need for bone graft and risks of disease transmission.  She voiced understanding and agreement.  Will have office arrange surgery.  Iokepa Geffre R 04/21/2016, 12:22 PM

## 2016-04-21 NOTE — Progress Notes (Signed)
[place] PHYSICAL MEDICINE & REHABILITATION     PROGRESS NOTE    Subjective/Complaints: Had a reasonable night. Up somewhat due to pain. RN reports some confusion last night. Trying toe at breakfast with left hand  ROS: Pt denies fever, rash/itching, headache, blurred or double vision, nausea, vomiting, abdominal pain, diarrhea, chest pain, shortness of breath, palpitations, dysuria, dizziness, neck or back pain, bleeding, anxiety, or depression   Objective: Vital Signs: Blood pressure 132/66, pulse 71, temperature 98.5 F (36.9 C), temperature source Oral, resp. rate 16, height 5\' 4"  (1.626 m), weight 85.7 kg (189 lb), SpO2 92 %. No results found.  Recent Labs  04/21/16 0458  WBC 4.4  HGB 10.0*  HCT 30.7*  PLT 157    Recent Labs  04/21/16 0458  NA 142  K 3.8  CL 106  GLUCOSE 99  BUN 7  CREATININE 0.85  CALCIUM 8.9   CBG (last 3)  No results for input(s): GLUCAP in the last 72 hours.  Wt Readings from Last 3 Encounters:  04/20/16 85.7 kg (189 lb)  04/16/16 85.7 kg (188 lb 15 oz)  03/09/16 82.6 kg (182 lb)    Physical Exam:  Gen: NAD. Well-developed. Vital signs reviewed. HENT: no diplopia.  Multiple bruising to the face. Injected right sclera. Neck: Normal range of motion. Neck supple. No thyromegalypresent.  Cardiovascular: Normal rateand regular rhythm.  Respiratory: Effort normaland breath sounds normal. No respiratory distress.  GI: Soft. Bowel sounds are normal. She exhibits no distension.  Musculoskeletal:    B/l LE edema Neurological: She is alert. Tracks to all fields Oriented to person place and time with cueing. Follows commands.  Sensation intact to light touch. Motor: LUE: 4+/5 proximal to distal. RUE Limited by splint B/l LE: Hip flexion 4-/5, knee extension 4/5, ankle dorsi/plantar flexion 4+/5 Skin: Skin is warmand dry  Assessment/Plan: 1. Functional/mobility deficits secondary to TBI and polytrauma which require 3+ hours per  day of interdisciplinary therapy in a comprehensive inpatient rehab setting. Physiatrist is providing close team supervision and 24 hour management of active medical problems listed below. Physiatrist and rehab team continue to assess barriers to discharge/monitor patient progress toward functional and medical goals.  Function:  Bathing Bathing position      Bathing parts      Bathing assist        Upper Body Dressing/Undressing Upper body dressing                    Upper body assist        Lower Body Dressing/Undressing Lower body dressing                                  Lower body assist        Toileting Toileting   Toileting steps completed by patient: Adjust clothing prior to toileting, Performs perineal hygiene, Adjust clothing after toileting   Toileting Assistive Devices: Grab bar or rail  Toileting assist Assist level: More than reasonable time, Touching or steadying assistance (Pt.75%)   Transfers Chair/bed Optician, dispensingtransfer             Locomotion Ambulation           Wheelchair          Cognition Comprehension Comprehension assist level: Follows basic conversation/direction with no assist  Expression Expression assist level: Expresses basic needs/ideas: With no assist  Social Interaction Social Interaction assist level: Interacts appropriately  with others with medication or extra time (anti-anxiety, antidepressant).  Problem Solving Problem solving assist level: Solves basic 90% of the time/requires cueing < 10% of the time  Memory Memory assist level: Complete Independence: No helper   Medical Problem List and Plan: 1. TBI/SAH/right distal radius fracture, right maxillary right zygomatic arch fracture  secondary to fall 04/16/2016                       -begin inpatient rehab therapies 2.  DVT Prophylaxis/Anticoagulation: SCDs. Monitor for any signs of DVT 3. Pain Management/Fibromyalgia: MS Contin 30 mg every 12 hours, MSIR 30 mg  every 6 hours as needed  -observe for neurosedating effects 4. Mood: Cymbalta 60 mg twice a day, Ativan 1 mg daily at bedtime, Requip 1 mg daily at bedtime as needed, Xanax 0.25 mg twice daily as needed.  -sundowned a little last night 5. Neuropsych: This patient is capable of making decisions on her own behalf. 6. Skin/Wound Care: Routine skin checks 7. Fluids/Electrolytes/Nutrition: labs all personally reviewed. All within normal limits 8. Right comminuted dorsally impacted fracture of the distal radius. No plan for surgical intervention at this time. Currently nonweightbearing. Follow-up per Dr. Merlyn Lot.  9. Right maxillary zygomatic arch fracture. Follow-up per Dr. Suszanne Conners with conservative care   LOS (Days) 1 A FACE TO FACE EVALUATION WAS PERFORMED  Amariana Mirando T 04/21/2016 8:53 AM

## 2016-04-21 NOTE — Progress Notes (Signed)
Physical Therapy Session Note  Patient Details  Name: Jillian Morris MRN: [id number] Date of Birth: 11/03/1945  Today's Date: 04/21/2016 PT Individual Time: 1500-1550 PT Individual Time Calculation (min): 50 min    Short Term Goals: Week 1:  PT Short Term Goal 1 (Week 1): STG = LTG due to short ELOS.  Skilled Therapeutic Interventions/Progress Updates: Patient in wheelchair with quick release belt off, agreeable to therapy despite chronic back pain and acute RUE pain. Gait training without device room > therapy gym x 150 ft with min A overall. Patient demonstrates high fall risk as noted by score of 30/56 on Berg Balance Scale, see details below. Instructed in gait training with trial of SBQC with patient unable to keep all 4 points on ground and SPC with patient initially using cane as blind cane, requiring demonstration cues for safe 2-point gait pattern. Gait training therapy gym > room using SPC with close supervision x 150 ft. Patient left sitting in wheelchair to await next session with quick release belt donned, needs in reach, and reinforced safety in room as patient reported she had taken quick release belt off herself prior to session but could not recall if she got up unassisted. Patient verbalized understanding.   Therapy Documentation Precautions:  Precautions Precautions: Fall Required Braces or Orthoses: Sling Restrictions Weight Bearing Restrictions: Yes RUE Weight Bearing: Non weight bearing Other Position/Activity Restrictions: sling for comfort Pain: Pain Assessment Pain Assessment: 0-10 Pain Score: 8  Pain Type: Acute pain Pain Location: Wrist Pain Orientation: Right Pain Descriptors / Indicators: Aching Pain Onset: On-going Pain Intervention(s): Repositioned;Rest Balance: Balance Balance Assessed: Yes Standardized Balance Assessment Standardized Balance Assessment: Berg Balance Test Berg Balance Test Sit to Stand: Able to stand without using hands and  stabilize independently Standing Unsupported: Able to stand 2 minutes with supervision Sitting with Back Unsupported but Feet Supported on Floor or Stool: Able to sit safely and securely 2 minutes Stand to Sit: Sits safely with minimal use of hands Transfers: Able to transfer with verbal cueing and /or supervision Standing Unsupported with Eyes Closed: Able to stand 10 seconds with supervision Standing Ubsupported with Feet Together: Needs help to attain position but able to stand for 30 seconds with feet together From Standing, Reach Forward with Outstretched Arm: Reaches forward but needs supervision From Standing Position, Pick up Object from Floor: Unable to try/needs assist to keep balance From Standing Position, Turn to Look Behind Over each Shoulder: Looks behind from both sides and weight shifts well Turn 360 Degrees: Needs close supervision or verbal cueing Standing Unsupported, Alternately Place Feet on Step/Stool: Able to complete >2 steps/needs minimal assist Standing Unsupported, One Foot in Front: Loses balance while stepping or standing Standing on One Leg: Able to lift leg independently and hold equal to or more than 3 seconds Total Score: 30/56   See Function Navigator for Current Functional Status.   Therapy/Group: Individual Therapy  Kerney ElbeVarner, Aleksia Freiman A 04/21/2016, 4:03 PM

## 2016-04-21 NOTE — Evaluation (Signed)
Physical Therapy Assessment and Plan  Patient Details  Name: Jillian Morris MRN: [id number] Date of Birth: 10-Jun-1946  PT Diagnosis: Abnormality of gait, Difficulty walking, Muscle weakness and Pain in back Rehab Potential: Good ELOS: 7-10 days   Today's Date: 04/21/2016 PT Individual Time: 8309-4076 PT Individual Time Calculation (min): 71 min     Problem List: Patient Active Problem List   Diagnosis Date Noted  . Fall 04/20/2016  . Multiple facial fractures ([place]) 04/20/2016  . Right wrist fracture 04/20/2016  . SAH (subarachnoid hemorrhage) ([place])   . Distal radial fracture   . Closed fracture of zygomatic arch ([place])   . Depression   . Fibromyalgia   . Traumatic subarachnoid hemorrhage ([place]) 04/16/2016  . Achalasia 08/09/2013  . Stricture and stenosis of esophagus 03/30/2012  . Dysphagia, unspecified(787.20) 03/21/2012  . Special screening for malignant neoplasms, colon 03/21/2012  . Esophageal reflux 03/21/2012    Past Medical History:  Past Medical History:  Diagnosis Date  . Acne rosacea   . Anemia    s/p gastric bypass, malabsorption  . Anxiety   . Arthritis   . B12 deficiency   . Chronic fatigue fibromyalgia syndrome   . Chronic low back pain   . Depression   . Fibromyalgia   . GERD (gastroesophageal reflux disease)   . Graves disease   . H/O hiatal hernia   . Hypothyroidism   . IBS (irritable bowel syndrome)   . Lymphedema   . Meralgia paraesthetica   . Sjogren's syndrome ([place])   . Status post dilation of esophageal narrowing   . Stroke [facility])    Past Surgical History:  Past Surgical History:  Procedure Laterality Date  . ABDOMINAL HYSTERECTOMY    . APPENDECTOMY    . BALLOON DILATION  03/30/2012   Procedure: BALLOON DILATION;  Surgeon: Inda Castle, MD;  Location: [facility];  Service: Endoscopy;  Laterality: N/A;  . BOTOX INJECTION N/A 12/26/2014   Procedure: BOTOX INJECTION;  Surgeon: Inda Castle, MD;  Location: [facility]OS[facility];  Service:  Endoscopy;  Laterality: N/A;  . ENDOMETRIAL ABLATION     x 4  . ESOPHAGEAL MANOMETRY N/A 05/15/2013   Procedure: ESOPHAGEAL MANOMETRY (EM);  Surgeon: Inda Castle, MD;  Location: [facility];  Service: Endoscopy;  Laterality: N/A;  . ESOPHAGOGASTRODUODENOSCOPY  03/30/2012   Procedure: ESOPHAGOGASTRODUODENOSCOPY (EGD);  Surgeon: Inda Castle, MD;  Location: [facility];  Service: Endoscopy;  Laterality: N/A;  . ESOPHAGOGASTRODUODENOSCOPY  05/04/2012   Procedure: ESOPHAGOGASTRODUODENOSCOPY (EGD);  Surgeon: Inda Castle, MD;  Location: [facility];  Service: Endoscopy;  Laterality: N/A;  . ESOPHAGOGASTRODUODENOSCOPY N/A 12/26/2014   Procedure: ESOPHAGOGASTRODUODENOSCOPY (EGD);  Surgeon: Inda Castle, MD;  Location: [facility];  Service: Endoscopy;  Laterality: N/A;  botox injection  . GASTRIC BYPASS    . LAMINECTOMY     L3-L5  . ORIF TRIPOD FRACTURE Right 04/19/2016   Procedure: OPEN REDUCTION INTERNAL FIXATION (ORIF) TRIPOD FRACTURE LIEBINGER MIDFACE NO DRILL;  Surgeon: Leta Baptist, MD;  Location: [facility];  Service: ENT;  Laterality: Right;  . 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 THYROIDECTOMY      Assessment & Plan Clinical Impression: Patient is a 70 y.o. year old female with history of fibromyalgia, chronic fatigue syndrome, chronic low back pain. Per chart review patient lives with spouse independently with a cane prior to admission. One level home. Presented 04/16/2016 after ground-level fall in the hallway of her ophthalmologist office. CT of the head showed  subarachnoid hemorrhage along the medial right frontal lobe and in the left side of the quadrigeminal plate cistern. Complex fracture involving the lateral inferior walls of the right orbit. Fractures to the anterior posterior superior inferior and lateral walls of the right maxillary sinus. Right zygomatic arch fracture. CT cervical spine negative. Noted also findings of right comminuted dorsally impacted fracture of the  distal radius with dorsal angulation of the distal radial articular surface. Dr. Vertell Limber for follow-up in regards to [facility] advise conservative care. Follow-up cranial CT scan stable. Dr. Fredna Dow follow-up no recommendations for surgical intervention at this time of right radius fracture, will plan as outpatient. Currently with right upper extremity sling. Hospital course pain management. Physical occupational therapy evaluations completed with recommendations of physical medicine rehabilitation consult.Patient was admitted for a comprehensive rehabilitation program.  Patient transferred to CIR on 04/20/2016 .   Patient currently requires min with mobility secondary to muscle weakness, decreased cardiorespiratoy endurance, decreased coordination, decreased safety awareness and decreased standing balance.  Prior to hospitalization, patient was modified independent  with mobility and lived with   in a   home.  Home access is   .  Patient will benefit from skilled PT intervention to maximize safe functional mobility, minimize fall risk and decrease caregiver burden for planned discharge home with 24 hour supervision.  Anticipate patient will benefit from follow up [place] at discharge.  PT - End of Session Activity Tolerance: Tolerates 30+ min activity with multiple rests Endurance Deficit: Yes Endurance Deficit Description:  (2/2 fatigue) PT Assessment Rehab Potential (ACUTE/IP ONLY): Good PT Patient demonstrates impairments in the following area(s): Balance;Endurance;Motor;Safety;Pain PT Transfers Functional Problem(s): Bed Mobility;Bed to Chair PT Locomotion Functional Problem(s): Ambulation;Wheelchair Mobility;Stairs PT Plan PT Intensity: Minimum of 1-2 x/day ,45 to 90 minutes PT Frequency: 5 out of 7 days PT Duration Estimated Length of Stay: 7-10 days PT Treatment/Interventions: Ambulation/gait training;Balance/vestibular training;Cognitive remediation/compensation;Community reintegration;DME/adaptive  equipment instruction;Neuromuscular re-education;Psychosocial support;Stair training;UE/LE Strength taining/ROM;Wheelchair propulsion/positioning;UE/LE Coordination activities;Therapeutic Activities;Pain management;Discharge planning;Functional mobility training;Patient/family education;Therapeutic Exercise;Visual/perceptual remediation/compensation;Skin care/wound management;Disease management/prevention PT Transfers Anticipated Outcome(s): Supervision PT Locomotion Anticipated Outcome(s): Supervision with LRAD PT Recommendation Follow Up Recommendations: Home health PT;24 hour supervision/assistance Patient destination: Home Equipment Recommended: To be determined  Skilled Therapeutic Intervention Pt received in bed requesting to eat breakfast; while pt ate breakfast therapist provided pt with w/c cushion for increased comfort and asked pt PLOF questions. PT evaluation initiated; educated pt on ELOS, therapy schedule while in CIR, and safety plan. Pt performed bed mobility (supine>sitting EOB) with hospital bed features and supervision. Throughout session pt ambulated with HHA and Min A overall, but Mod A on occasion 2/2 loss of balance, up to 160 ft. Pt able to negotiate stairs with L ascending rail, maximum cuing for step to pattern and Mod A overall. Pt completed all transfers (sit<>stand, stand pivot, and car) with Min/mod A, cuing for hand placement & sequencing and HHA. Pt reports she is limited by L knee pain (apparent with stair negotiation) and needs a L knee replacement. Pt did require frequent cuing for safety and reminders to not ambulate/get out of chair without assistance. Pt was able to complete toilet transfer with min A & grab bars; (+) void and supervision for peri-hygiene. At end of session pt left sitting in w/c with all needs within reach & QRB donned.   PT Evaluation Precautions/Restrictions Precautions Precautions: Fall Restrictions Weight Bearing Restrictions: Yes RUE  Weight Bearing: Non weight bearing Other Position/Activity Restrictions: sling   General Chart Reviewed: Yes Response  to Previous Treatment: Not applicable Family/Caregiver Present: No   Pain Pain Assessment Pain Assessment: 0-10 Pain Score: 8  Pain Location: Back  Home Living/Prior Functioning Home Living Available Help at Discharge: Family;Available 24 hours/day Type of Home: House Home Access: Level entry (uses back door entrance) Home Layout: One level Bathroom Shower/Tub: Chiropodist: Handicapped height (has both) Additional Comments:  (sponge baths PTA 2/2 "balance problems")  Lives With: Spouse ("Sparky") Prior Function Level of Independence: Independent with gait;Independent with transfers;Independent with homemaking with ambulation;Independent with basic ADLs (with SPC; pt reports 4-5 falls in past year)  Able to Take Stairs?: No (required assistance, step to pattern) Driving: Yes Vocation: Retired Leisure: Hobbies-yes (Comment) Comments:  (reading)   Vision/Perception  Pt wears glasses for reading only at baseline. Pt reports diplopia since fall but states this is improving. Vision not assessed at eval; pt denies diplopia while speaking with therapist.   Cognition Overall Cognitive Status: No family/caregiver present to determine baseline cognitive functioning Arousal/Alertness: Awake/alert Orientation Level: Oriented X4;Oriented to person;Oriented to place;Oriented to time;Oriented to situation Memory: Impaired Safety/Judgment: Impaired   Sensation Sensation Light Touch: Appears Intact Proprioception: Appears Intact Additional Comments:  (pt reports tardive dyskinesia mainly LLE 2/2 medication, notes numbness in R side of face)   Mobility Bed Mobility Bed Mobility: Supine to Sit Supine to Sit: 5: Supervision Supine to Sit Details: Verbal cues for technique;Verbal cues for sequencing;Verbal cues for precautions/safety Supine to Sit  Details (indicate cue type and reason): with use of bed rails & HOB elevated Transfers Transfers: Yes (all transfers with HHA) Sit to Stand: 4: Min assist Sit to Stand Details: Verbal cues for technique;Verbal cues for sequencing Stand to Sit: 4: Min assist Stand to Sit Details (indicate cue type and reason): Verbal cues for technique;Verbal cues for sequencing Stand Pivot Transfers: 4: Min assist Stand Pivot Transfer Details: Verbal cues for technique;Verbal cues for sequencing   Locomotion  Ambulation Ambulation: Yes Ambulation/Gait Assistance: 4: Min assist Ambulation Distance (Feet): 160 Feet Assistive device: 1 person hand held assist Gait Gait: Yes Gait Pattern:  (inconsistent step length BLE, lateral sway, decreased gait speed) Stairs / Additional Locomotion Stairs: Yes Stairs Assistance: 3: Mod assist Stair Management Technique: One rail Left Number of Stairs: 12 Height of Stairs:  (6 inches + 3 inches) Wheelchair Mobility Wheelchair Mobility: No   Extremity Assessment  RLE Assessment RLE Assessment: Within Functional Limits (grossly 3+/5 - 4/5 strength) LLE Assessment LLE Assessment: Within Functional Limits (grossly 3+/5 - 4/5 strength)   See Function Navigator for Current Functional Status.   Refer to Care Plan for Long Term Goals  Recommendations for other services: None  Discharge Criteria: Patient will be discharged from PT if patient refuses treatment 3 consecutive times without medical reason, if treatment goals not met, if there is a change in medical status, if patient makes no progress towards goals or if patient is discharged from hospital.  The above assessment, treatment plan, treatment alternatives and goals were discussed and mutually agreed upon: by patient  Waunita Schooner 04/21/2016, 8:13 AM

## 2016-04-22 ENCOUNTER — Inpatient Hospital Stay (HOSPITAL_COMMUNITY): Payer: Medicare Other | Admitting: Speech Pathology

## 2016-04-22 ENCOUNTER — Inpatient Hospital Stay (HOSPITAL_COMMUNITY): Payer: Medicare Other

## 2016-04-22 ENCOUNTER — Inpatient Hospital Stay (HOSPITAL_COMMUNITY): Payer: Medicare Other | Admitting: Occupational Therapy

## 2016-04-22 ENCOUNTER — Inpatient Hospital Stay (HOSPITAL_COMMUNITY): Payer: Medicare Other | Admitting: Physical Therapy

## 2016-04-22 ENCOUNTER — Other Ambulatory Visit: Payer: Self-pay | Admitting: Orthopedic Surgery

## 2016-04-22 MED ORDER — LORAZEPAM 0.5 MG PO TABS
1.0000 mg | ORAL_TABLET | Freq: Every day | ORAL | Status: DC
  Administered 2016-04-22 – 2016-05-01 (×9): 1 mg via ORAL
  Filled 2016-04-22 (×9): qty 2

## 2016-04-22 NOTE — Progress Notes (Signed)
Occupational Therapy Session Note  Patient Details  Name: Jillian Morris MRN: [id number] Date of Birth: 09/14/1945  Today's Date: 04/22/2016 OT Individual Time: 1000-1100 OT Individual Time Calculation (min): 60 min     Short Term Goals: Week 1:  OT Short Term Goal 1 (Week 1): Pt will recall UB hemi-dressing technique with 1 VC in order to increase independence with ADLs OT Short Term Goal 2 (Week 1): Pt will complete 2 grooming tasks in standing in order to increase functional activity tolerance OT Short Term Goal 3 (Week 1): Pt will dress LB with supervision OT Short Term Goal 4 (Week 1): Pt will direct caregiver for proper donning technique of UE sling mod I  Skilled Therapeutic Interventions/Progress Updates:    Pt resting in w/c upon arrival.  Pt already dressed (with PT earlier). Reviewed OT role in rehab.  Pt requested that a female therapist assist with bathing and dressing tasks.  Reviewed RUE weight bearing restrictions.  Pt engaged in functional amb with SPC.  Pt required max verbal cues for appropriate use of SPC during ambulation.  Pt c/o increased blurred vision since her fall but during further discussion pt also stated that her vision had been changing prior to her accident.  Discussed bathing arrangements at home and pt stated she did not take many showers in her bathtub because she was fearful of falling.  Discussed use of tub bench and amb with SPC to tub room.  Pt exhibited LOB X 2 requiring assistance to correct.  Pt stated that she was "pretty tired" from morning therapies. Did not attempt tub bench transfer secondary to fatigue.  Pt returned to room in w/c and remained in w/c with QRB in place and all needs within reach.  Focus on activity tolerance, functional amb with SPC, standing balance, discharge planning, and safety awareness to increase independence with BADLs.  Therapy Documentation Precautions:  Precautions Precautions: Fall Required Braces or Orthoses:  Sling Restrictions Weight Bearing Restrictions: Yes RUE Weight Bearing: Non weight bearing Other Position/Activity Restrictions: sling for comfort Pain: Pain Assessment Pain Assessment: 0-10 Pain Score: 4  Pain Type: Acute pain Pain Location: Arm Pain Orientation: Right Pain Descriptors / Indicators: Aching Pain Frequency: Constant Pain Onset: With Activity Patients Stated Pain Goal: 3 Pain Intervention(s): Rest   Other Treatments:    See Function Navigator for Current Functional Status.   Therapy/Group: Individual Therapy  Rich BraveLanier, Izaih Kataoka Chappell 04/22/2016, 11:12 AM

## 2016-04-22 NOTE — Progress Notes (Signed)
[place] PHYSICAL MEDICINE & REHABILITATION     PROGRESS NOTE    Subjective/Complaints: Had a better night. Had questions about timing of meds. Would like ativan earlier.  ROS: Pt denies fever, rash/itching, headache, blurred or double vision, nausea, vomiting, abdominal pain, diarrhea, chest pain, shortness of breath, palpitations, dysuria, dizziness, neck or back pain, bleeding, anxiety, or depression   Objective: Vital Signs: Blood pressure 125/61, pulse 74, temperature 98.2 F (36.8 C), temperature source Oral, resp. rate 17, height 5\' 4"  (1.626 m), weight 85.7 kg (189 lb), SpO2 94 %. No results found.  Recent Labs  04/21/16 0458  WBC 4.4  HGB 10.0*  HCT 30.7*  PLT 157    Recent Labs  04/21/16 0458  NA 142  K 3.8  CL 106  GLUCOSE 99  BUN 7  CREATININE 0.85  CALCIUM 8.9   CBG (last 3)  No results for input(s): GLUCAP in the last 72 hours.  Wt Readings from Last 3 Encounters:  04/20/16 85.7 kg (189 lb)  04/16/16 85.7 kg (188 lb 15 oz)  03/09/16 82.6 kg (182 lb)    Physical Exam:  Gen: NAD. Well-developed. Vital signs reviewed. HENT: no diplopia.  Multiple bruising to the face/neck improving. Injected right sclera. Eye guard over right maxilla Neck: Normal range of motion. Neck supple. No thyromegalypresent.  Cardiovascular: Normal rateand regular rhythm.  Respiratory: Effort normaland breath sounds normal. No respiratory distress.  GI: Soft. Bowel sounds are normal. She exhibits no distension.  Musculoskeletal:    B/l LE edema Neurological: She is alert. Tracks to all fields Oriented to person place and time with cueing. Follows commands. Improving insight  Sensation intact to light touch. Motor: LUE: 4+/5 proximal to distal. RUE Limited by splint B/l LE: Hip flexion 4-/5, knee extension 4/5, ankle dorsi/plantar flexion 4+/5 Skin: Skin is warmand dry  Assessment/Plan: 1. 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 Physiatrist is providing close team supervision and 24 hour management of active medical problems listed below. Physiatrist and rehab team continue to assess barriers to discharge/monitor patient progress toward functional and medical goals.  Function:  Bathing Bathing position   Position: Wheelchair/chair at sink  Bathing parts Body parts bathed by patient: Chest, Abdomen, Right arm, Front perineal area, Buttocks, Right upper leg, Left upper leg, Right lower leg, Left lower leg Body parts bathed by helper: Left arm, Back  Bathing assist Assist Level: Touching or steadying assistance(Pt > 75%)      Upper Body Dressing/Undressing Upper body dressing   What is the patient wearing?: Hospital gown                Upper body assist        Lower Body Dressing/Undressing Lower body dressing   What is the patient wearing?: Underwear, Non-skid slipper socks, Pants Underwear - Performed by patient: Thread/unthread right underwear leg, Thread/unthread left underwear leg, Pull underwear up/down   Pants- Performed by patient: Thread/unthread right pants leg, Thread/unthread left pants leg, Pull pants up/down Pants- Performed by helper: Fasten/unfasten pants Non-skid slipper socks- Performed by patient: Don/doff right sock, Don/doff left sock                    Lower body assist Assist for lower body dressing: Touching or steadying assistance (Pt > 75%)      Toileting Toileting   Toileting steps completed by patient: Adjust clothing prior to  toileting, Performs perineal hygiene, Adjust clothing after toileting   Toileting Assistive Devices: Grab bar or rail  Toileting assist Assist level: Touching or steadying assistance (Pt.75%)   Transfers Chair/bed transfer   Chair/bed transfer method: Ambulatory Chair/bed transfer assist level: Touching or steadying assistance (Pt >  75%) Chair/bed transfer assistive device: Cane, Armrests     Locomotion Ambulation     Max distance: 150 ft Assist level: Touching or steadying assistance (Pt > 75%)   Wheelchair Wheelchair activity did not occur: Safety/medical concerns        Cognition Comprehension Comprehension assist level: Follows basic conversation/direction with extra time/assistive device  Expression Expression assist level: Expresses basic needs/ideas: With no assist  Social Interaction Social Interaction assist level: Interacts appropriately 50 - 74% of the time - May be physically or verbally inappropriate.  Problem Solving Problem solving assist level: Solves basic 90% of the time/requires cueing < 10% of the time  Memory Memory assist level: Recognizes or recalls 75 - 89% of the time/requires cueing 10 - 24% of the time   Medical Problem List and Plan: 1. TBI/SAH/right distal radius fracture, right maxillary right zygomatic arch fracture  secondary to fall 04/16/2016                       -continue therapies 2.  DVT Prophylaxis/Anticoagulation: SCDs. Monitor for any signs of DVT 3. Pain Management/Fibromyalgia: MS Contin 30 mg every 12 hours, MSIR 30 mg every 6 hours as needed  -observe for neurosedating effects  -pain control reasonable at present. Working on timing of meds 4. Mood: Cymbalta 60 mg twice a day, Ativan 1 mg daily at bedtime--adjust to 9pm, Requip 1 mg daily at bedtime as needed, Xanax 0.25 mg twice daily as needed.  -sundowns a little last night 5. Neuropsych: This patient is capable of making decisions on her own behalf. 6. Skin/Wound Care: Routine skin checks 7. Fluids/Electrolytes/Nutrition: labs all personally reviewed. All within normal limits upon admit 8. Right comminuted dorsally impacted fracture of the distal radius. No plan for surgical intervention at this time. Currently nonweightbearing. Follow-up per Dr. Zada Girt confirm plan today (as pt/family say Merlyn Lot mentioned  surgery to them)  9. Right maxillary zygomatic arch fracture. Follow-up per Dr. Suszanne Conners with conservative care   LOS (Days) 2 A FACE TO FACE EVALUATION WAS PERFORMED  Bunny Lowdermilk T 04/22/2016 8:56 AM

## 2016-04-22 NOTE — Progress Notes (Signed)
Social Work  Social Work Assessment and Plan  Patient Details  Name: Jillian Morris MRN: [id number] Date of Birth: 10/16/1945  Today's Date: 04/22/2016  Problem List:  Patient Active Problem List   Diagnosis Date Noted  . Fall 04/20/2016  . Multiple facial fractures (HCC) 04/20/2016  . Right wrist fracture 04/20/2016  . Distal radial fracture   . Closed fracture of zygomatic arch (HCC)   . Depression   . Fibromyalgia   . Traumatic subarachnoid hemorrhage (HCC) 04/16/2016  . Achalasia 08/09/2013  . Stricture and stenosis of esophagus 03/30/2012  . Dysphagia, unspecified(787.20) 03/21/2012  . Special screening for malignant neoplasms, colon 03/21/2012  . Esophageal reflux 03/21/2012   Past Medical History:  Past Medical History:  Diagnosis Date  . Acne rosacea   . Anemia    s/p gastric bypass, malabsorption  . Anxiety   . Arthritis   . B12 deficiency   . Chronic fatigue fibromyalgia syndrome   . Chronic low back pain   . Depression   . Fibromyalgia   . GERD (gastroesophageal reflux disease)   . Graves disease   . H/O hiatal hernia   . Hypothyroidism   . IBS (irritable bowel syndrome)   . Lymphedema   . Meralgia paraesthetica   . Sjogren's syndrome (HCC)   . Status post dilation of esophageal narrowing   . Stroke [facility](HCC)    Past Surgical History:  Past Surgical History:  Procedure Laterality Date  . ABDOMINAL HYSTERECTOMY    . APPENDECTOMY    . BALLOON DILATION  03/30/2012   Procedure: BALLOON DILATION;  Surgeon: Louis Meckelobert D Kaplan, MD;  Location: [facility];  Service: Endoscopy;  Laterality: N/A;  . BOTOX INJECTION N/A 12/26/2014   Procedure: BOTOX INJECTION;  Surgeon: Louis Meckelobert D Kaplan, MD;  Location: [facility];  Service: Endoscopy;  Laterality: N/A;  . ENDOMETRIAL ABLATION     x 4  . ESOPHAGEAL MANOMETRY N/A 05/15/2013   Procedure: ESOPHAGEAL MANOMETRY (EM);  Surgeon: Louis Meckelobert D Kaplan, MD;  Location: [facility];  Service: Endoscopy;  Laterality: N/A;  .  ESOPHAGOGASTRODUODENOSCOPY  03/30/2012   Procedure: ESOPHAGOGASTRODUODENOSCOPY (EGD);  Surgeon: Louis Meckelobert D Kaplan, MD;  [facility];  Service: Endoscopy;  Laterality: N/A;  . ESOPHAGOGASTRODUODENOSCOPY  05/04/2012   Procedure: ESOPHAGOGASTRODUODENOSCOPY (EGD);  Surgeon: Louis Meckelobert D Kaplan, MD;  [facility];  Service: Endoscopy;  Laterality: N/A;  . ESOPHAGOGASTRODUODENOSCOPY N/A 12/26/2014   Procedure: ESOPHAGOGASTRODUODENOSCOPY (EGD);  Surgeon: Louis Meckelobert D Kaplan, MD;  [facility];  Service: Endoscopy;  Laterality: N/A;  botox injection  . GASTRIC BYPASS    . LAMINECTOMY     L3-L5  . ORIF TRIPOD FRACTURE Right 04/19/2016   Procedure: OPEN REDUCTION INTERNAL FIXATION (ORIF) TRIPOD FRACTURE LIEBINGER MIDFACE NO DRILL;  Surgeon: Newman PiesSu Teoh, MD;  Location: [facility] OR;  Service: ENT;  Laterality: Right;  . 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 THYROIDECTOMY     Social History:  reports that she has never smoked. She has never used smokeless tobacco. She reports that she does not drink alcohol or use drugs.  Family / Support Systems Marital Status: Married How Long?: 31 yrs Patient Roles: Spouse, Parent Spouse/Significant Other: spouse, Waymon AmatoDoug [place] "Sparky" @ (C) [phone] Children: Pt reports they have two adult sons living in [place] and [place] but do not anticipate them assisting at d/c due to work and family demands. Anticipated Caregiver: spouse Ability/Limitations of Caregiver: no limitations Caregiver Availability: 24/7 Family Dynamics: Spouse very supportive and encouraging to pt  during interview.  States he is her "biggest supporter" and denies any concern about providing support upon d/c.  Social History Preferred language: English Religion: Christian Cultural Background: NA Read: Yes Write: Yes Employment Status: Retired Date Retired/Disabled/Unemployed: 31 yrs ago Fish farm manager Issues: None Guardian/Conservator: None - per MD, pt is capable  of making decisions on her own behalf   Abuse/Neglect Physical Abuse: Denies Verbal Abuse: Denies Sexual Abuse: Denies Exploitation of patient/patient's resources: Denies Self-Neglect: Denies  Emotional Status Pt's affect, behavior adn adjustment status: Pt pleasant and talkative, completing assessment interview without difficulty.  She denies any current, significant emotional distress related to injuries.  Does admit some frustration with limitations overall.  Have referred for neuropsychology consult to further tease out emotional adjustment and cognitive issues. Recent Psychosocial Issues: None Pyschiatric History: Pt followed by Dr. Meredith Staggers for many years for medication management of depression and anxiety.   Substance Abuse History: NA  Patient / Family Perceptions, Expectations & Goals Pt/Family understanding of illness & functional limitations: Pt initially reports her facial and orbital fxs, however, when questioned she does report SAH as well.  Pt and husband appear to have a good basic understanding of her medical issues and current functional limitations. Premorbid pt/family roles/activities: Pt was independent overall but using cane in community because of poor balance at times. Anticipated changes in roles/activities/participation: Husband reports that he will be her caregiver.  Goals set for supervision. Pt/family expectations/goals: "I hope I'll be able to get around a little better than I am right now."  [employer]: None Premorbid Home Care/DME Agencies: None Transportation available at discharge: yes Resource referrals recommended: Neuropsychology  Discharge Planning Living Arrangements: Spouse/significant other Support Systems: Spouse/significant other Type of Residence: Private residence Insurance Resources: [employer] ([employer] Medicare) Financial Resources: Social Security Financial Screen Referred: No Living Expenses: Own Money  Management: Spouse Does the patient have any problems obtaining your medications?: No Home Management: pt and spouse Patient/Family Preliminary Plans: Pt to d/c home with spouse who will provide 24/7 care Social Work Anticipated Follow Up Needs: HH/OP Expected length of stay: 10-14 days  Clinical Impression Unfortunate woman here following a fall as she was entering MD office and now with [facility] and facial fxs.  Able to complete assessment interview without difficulty.  Spouse at bedside and very encouraging.  Pt denies any significant s/s of depression or anxiety.  Chronic h/o both, however, and she is managed with medication.  Will follow for support and d/c planning needs.  Elijahjames Fuelling 04/22/2016, 4:00 PM

## 2016-04-22 NOTE — Progress Notes (Signed)
Speech Language Pathology Daily Session Note  Patient Details  Name: Elie GoodyBrenda L Leach MRN: [id number] Date of Birth: 12/24/1945  Today's Date: 04/22/2016 SLP Individual Time: 0928-0958 SLP Individual Time Calculation (min): 30 min   Short Term Goals: Week 1: SLP Short Term Goal 1 (Week 1): Patient will sustian attention to functional task for 10 minutes with Supervision level verbal cues SLP Short Term Goal 2 (Week 1): Patient will utilize external aids to assist with recall of new and daily information with Min verbal cues SLP Short Term Goal 3 (Week 1): Patient will label 2 cognitive change with Min question cues  SLP Short Term Goal 4 (Week 1): Patient will request help an needed with Min assist verbal cues SLP Short Term Goal 5 (Week 1): Patient will solve mildly complex problems with Min assist verbal and visual cues to recognize and coorect errors  Skilled Therapeutic Interventions:Skilled therapy intervention focused on cognitive goals. Patient independently demonstrated selective attention in a moderately distracting environment for ~ 20 minutes while engaging in a mildly complex functional task. Patient initially required Min A verbal and visual cues for organization of a calendar task, and was Mod I for problem-solving. Patient presented as mildly tangential; will monitor in future sessions. Patient left upright in wheelchair with all needs within reach. Continue current plan of care.     Function:  Cognition Comprehension Comprehension assist level: Understands complex 90% of the time/cues 10% of the time  Expression   Expression assist level: Expresses complex ideas: With extra time/assistive device  Social Interaction Social Interaction assist level: Interacts appropriately with others with medication or extra time (anti-anxiety, antidepressant).  Problem Solving Problem solving assist level: Solves basic 90% of the time/requires cueing < 10% of the time  Memory Memory assist  level: Recognizes or recalls 90% of the time/requires cueing < 10% of the time    Pain No/denies pain  Therapy/Group: Individual Therapy  Caryn Sectionlison Beni Turrell 04/22/2016, 11:19 AM

## 2016-04-22 NOTE — Progress Notes (Signed)
Physical Therapy Session Note  Patient Details  Name: Jillian Morris MRN: [id number] Date of Birth: 06/20/1946  Today's Date: 04/22/2016 PT Individual Time: 0800-0855 PT Individual Time Calculation (min): 55 min    Short Term Goals: Week 1:  PT Short Term Goal 1 (Week 1): STG = LTG due to short ELOS.  Skilled Therapeutic Interventions/Progress Updates:    Patient sitting EOB upon arrival after finishing breakfast. Patient requesting to get dressed before leaving room. Patient ambulated around room to retrieve clothing items and carried them back to bed without AD with steady assist. Performed UB/LB dressing sitting EOB with sit <> stand transfer with supervision and verbal cues for threading RUE in sleeve first due to splint, total A to don R sling. Patient ambulated in room to and from bathroom and to sink using SPC with close supervision-steady assist and performed toileting tasks with supervision. Patient stood at sink for hand hygiene and to brush teeth with supervision and verbal cues for problem solving how to open toothpaste and apply to toothbrush. Patient ambulated from room to and from ADL apartment in controlled and home environment using [facility] with close supervision and verbal cues for correct 2-point gait sequencing using cane. On regular bed in ADL apartment, patient performed bed mobility to L with supervision. Patient left sitting in wheelchair with quick release belt on, call bell in lap, needs in reach, and NT present.   Therapy Documentation Precautions:  Precautions Precautions: Fall Required Braces or Orthoses: Sling Restrictions Weight Bearing Restrictions: Yes RUE Weight Bearing: Non weight bearing Other Position/Activity Restrictions: sling for comfort Pain: Pain Assessment Pain Assessment: 0-10 Pain Score: 6  Pain Type: Acute pain Pain Location: Arm Pain Orientation: Right Pain Descriptors / Indicators: Aching Pain Frequency: Constant Pain Onset: With  Activity Patients Stated Pain Goal: 3 Pain Intervention(s): Rest   See Function Navigator for Current Functional Status.   Therapy/Group: Individual Therapy  Kerney ElbeVarner, Johneisha Broaden A 04/22/2016, 9:03 AM

## 2016-04-22 NOTE — Progress Notes (Signed)
Speech Language Pathology Daily Session Note  Patient Details  Name: Jillian Morris MRN: [id number] Date of Birth: 11/02/1945  Today's Date: 04/22/2016 SLP Individual Time: 1132-1202 SLP Individual Time Calculation (min): 30 min   Short Term Goals: Week 1: SLP Short Term Goal 1 (Week 1): Patient will sustian attention to functional task for 10 minutes with Supervision level verbal cues SLP Short Term Goal 2 (Week 1): Patient will utilize external aids to assist with recall of new and daily information with Min verbal cues SLP Short Term Goal 3 (Week 1): Patient will label 2 cognitive change with Min question cues  SLP Short Term Goal 4 (Week 1): Patient will request help an needed with Min assist verbal cues SLP Short Term Goal 5 (Week 1): Patient will solve mildly complex problems with Min assist verbal and visual cues to recognize and coorect errors  Skilled Therapeutic Interventions: Skilled treatment session focused on addressing cognition goals. SLP facilitated session by providing Mod assist question cues to recall what the previously stared task was that was supposed to be completed during visit.  Patient attempted to resume the calendar organization task; however, could not locate where she left off despite Max assist multimodal cues and reported that she was too tired to complete the task today.  SLP then facilitated session with reviewing the patient's personal daily schedule with Mod assist question cues to recall 1 detail.  Session ended with patient receiving her lunch tray and spouse present. Continue with current plan of care.    Function:  Cognition Comprehension Comprehension assist level: Understands complex 90% of the time/cues 10% of the time  Expression   Expression assist level: Expresses complex 90% of the time/cues < 10% of the time  Social Interaction Social Interaction assist level: Interacts appropriately 90% of the time - Needs monitoring or encouragement for  participation or interaction.  Problem Solving Problem solving assist level: Solves basic 90% of the time/requires cueing < 10% of the time  Memory Memory assist level: Recognizes or recalls 50 - 74% of the time/requires cueing 25 - 49% of the time    Pain Pain Assessment Pain Assessment: Yes Pain Score: 4  Pain Type: Acute pain Pain Location: Arm Pain Orientation: Right Pain Descriptors / Indicators: Aching Pain Onset: With Activity Pain Intervention(s): Rest  Therapy/Group: Individual Therapy  Jillian FerrettiMelissa Julietta Morris, M.A., CCC-SLP [phone]  Jillian Morris 04/22/2016, 12:08 PM

## 2016-04-22 NOTE — Progress Notes (Signed)
Occupational Therapy Session Note  Patient Details  Name: Jillian Morris MRN: [id number] Date of Birth: 09/22/1945  Today's Date: 04/22/2016 OT Individual Time: 1300-1330 OT Individual Time Calculation (min): 30 min     Short Term Goals: Week 1:  OT Short Term Goal 1 (Week 1): Pt will recall UB hemi-dressing technique with 1 VC in order to increase independence with ADLs OT Short Term Goal 2 (Week 1): Pt will complete 2 grooming tasks in standing in order to increase functional activity tolerance OT Short Term Goal 3 (Week 1): Pt will dress LB with supervision OT Short Term Goal 4 (Week 1): Pt will direct caregiver for proper donning technique of UE sling mod I  Skilled Therapeutic Interventions/Progress Updates:   1:1 Pt missed 60 min of skilled OT due to fatigue- cognitively and physically and requested to return to bed to rest. Engaged in basic BI education with pt and pt's husband and transitioning to home environment and expectations. Pt able to transfer back to bed with min A and min A to return to supine with A to position in bed for comfort with pillows.   Pt reports not sleeping well the night before and has pending sx tomorrow afternoon.   Therapy Documentation Precautions:  Precautions Precautions: Fall Required Braces or Orthoses: Sling Restrictions Weight Bearing Restrictions: Yes RUE Weight Bearing: Non weight bearing Other Position/Activity Restrictions: sling for comfort General: General OT Amount of Missed Time: 60 Minutes Vital Signs:   Pain:   ADL:   Exercises:   Other Treatments:    See Function Navigator for Current Functional Status.   Therapy/Group: Individual Therapy  Roney MansSmith, Azaan Leask [facility] 04/22/2016, 9:39 PM

## 2016-04-22 NOTE — Progress Notes (Signed)
Social Work Patient ID: Jillian Morris, female   DOB: 05/26/1946, 70 y.o.   MRN: [id number]   Have reviewed team conference with pt and spouse.  Both aware and agreeable with targeted d/c date of 9/9 and supervision goals.  They report plan for wrist surgery tomorrow.  Will continue to follow.  Jillian Hilaire, LCSW

## 2016-04-23 ENCOUNTER — Encounter (HOSPITAL_COMMUNITY)
Admission: RE | Disposition: A | Discharge status: Home / Self Care | Payer: Self-pay | Source: Intra-hospital | Attending: Physical Medicine & Rehabilitation

## 2016-04-23 ENCOUNTER — Inpatient Hospital Stay (HOSPITAL_COMMUNITY): Payer: Medicare Other | Admitting: Occupational Therapy

## 2016-04-23 ENCOUNTER — Inpatient Hospital Stay (HOSPITAL_COMMUNITY): Payer: Medicare Other | Admitting: Physical Therapy

## 2016-04-23 ENCOUNTER — Inpatient Hospital Stay (HOSPITAL_COMMUNITY): Payer: Medicare Other | Admitting: Anesthesiology

## 2016-04-23 ENCOUNTER — Encounter (HOSPITAL_COMMUNITY): Payer: Medicare Other

## 2016-04-23 ENCOUNTER — Inpatient Hospital Stay (HOSPITAL_COMMUNITY): Payer: Medicare Other | Admitting: Speech Pathology

## 2016-04-23 ENCOUNTER — Inpatient Hospital Stay (HOSPITAL_COMMUNITY): Admission: RE | Admit: 2016-04-23 | Payer: Medicare Other | Source: Ambulatory Visit | Admitting: Orthopedic Surgery

## 2016-04-23 ENCOUNTER — Other Ambulatory Visit: Payer: Self-pay

## 2016-04-23 DIAGNOSIS — S066X2S Traumatic subarachnoid hemorrhage with loss of consciousness of 31 minutes to 59 minutes, sequela: Secondary | ICD-10-CM

## 2016-04-23 DIAGNOSIS — S0292XS Unspecified fracture of facial bones, sequela: Secondary | ICD-10-CM

## 2016-04-23 DIAGNOSIS — M797 Fibromyalgia: Secondary | ICD-10-CM

## 2016-04-23 DIAGNOSIS — S52501S Unspecified fracture of the lower end of right radius, sequela: Secondary | ICD-10-CM

## 2016-04-23 HISTORY — PX: OPEN REDUCTION INTERNAL FIXATION (ORIF) DISTAL RADIAL FRACTURE: SHX5989

## 2016-04-23 SURGERY — OPEN REDUCTION INTERNAL FIXATION (ORIF) DISTAL RADIUS FRACTURE
Anesthesia: General | Laterality: Right

## 2016-04-23 MED ORDER — CHLORHEXIDINE GLUCONATE 4 % EX LIQD
60.0000 mL | Freq: Once | CUTANEOUS | Status: AC
  Administered 2016-04-23: 4 via TOPICAL
  Filled 2016-04-23: qty 60

## 2016-04-23 MED ORDER — METHOCARBAMOL 1000 MG/10ML IJ SOLN
500.0000 mg | Freq: Four times a day (QID) | INTRAVENOUS | Status: DC | PRN
  Filled 2016-04-23: qty 5

## 2016-04-23 MED ORDER — BUPIVACAINE-EPINEPHRINE (PF) 0.5% -1:200000 IJ SOLN
INTRAMUSCULAR | Status: DC | PRN
  Administered 2016-04-23: 30 mL via PERINEURAL

## 2016-04-23 MED ORDER — LIDOCAINE HCL (CARDIAC) 20 MG/ML IV SOLN
INTRAVENOUS | Status: DC | PRN
  Administered 2016-04-23: 100 mg via INTRAVENOUS

## 2016-04-23 MED ORDER — MIDAZOLAM HCL 2 MG/2ML IJ SOLN
1.0000 mg | INTRAMUSCULAR | Status: DC | PRN
  Administered 2016-04-23: 1 mg via INTRAVENOUS

## 2016-04-23 MED ORDER — FENTANYL CITRATE (PF) 100 MCG/2ML IJ SOLN
INTRAMUSCULAR | Status: DC | PRN
  Administered 2016-04-23: 100 ug via INTRAVENOUS

## 2016-04-23 MED ORDER — METOCLOPRAMIDE HCL 5 MG PO TABS: 5.0000 mg | ORAL_TABLET | Freq: Three times a day (TID) | ORAL | Status: DC | PRN

## 2016-04-23 MED ORDER — ONDANSETRON HCL 4 MG/2ML IJ SOLN
INTRAMUSCULAR | Status: DC | PRN
  Administered 2016-04-23: 4 mg via INTRAVENOUS

## 2016-04-23 MED ORDER — FENTANYL CITRATE (PF) 100 MCG/2ML IJ SOLN
25.0000 ug | INTRAMUSCULAR | Status: DC | PRN
  Administered 2016-04-23: 50 ug via INTRAVENOUS

## 2016-04-23 MED ORDER — LACTATED RINGERS IV SOLN
INTRAVENOUS | Status: DC | PRN
  Administered 2016-04-23 (×2): via INTRAVENOUS

## 2016-04-23 MED ORDER — LACTATED RINGERS IV SOLN
Freq: Once | INTRAVENOUS | Status: AC
  Administered 2016-04-23: 15:00:00 via INTRAVENOUS

## 2016-04-23 MED ORDER — ENOXAPARIN SODIUM 40 MG/0.4ML ~~LOC~~ SOLN
40.0000 mg | SUBCUTANEOUS | Status: DC
  Administered 2016-04-24 – 2016-04-30 (×7): 40 mg via SUBCUTANEOUS
  Filled 2016-04-23 (×7): qty 0.4

## 2016-04-23 MED ORDER — 0.9 % SODIUM CHLORIDE (POUR BTL) OPTIME
TOPICAL | Status: DC | PRN
  Administered 2016-04-23: 1000 mL

## 2016-04-23 MED ORDER — VANCOMYCIN HCL IN DEXTROSE 1-5 GM/200ML-% IV SOLN
1000.0000 mg | INTRAVENOUS | Status: AC
  Administered 2016-04-23: 1000 mg via INTRAVENOUS

## 2016-04-23 MED ORDER — METHOCARBAMOL 500 MG PO TABS
500.0000 mg | ORAL_TABLET | Freq: Four times a day (QID) | ORAL | Status: DC | PRN
  Administered 2016-04-23 – 2016-04-25 (×3): 500 mg via ORAL
  Filled 2016-04-23 (×4): qty 1

## 2016-04-23 MED ORDER — DIPHENHYDRAMINE HCL 12.5 MG/5ML PO ELIX: 12.5000 mg | ORAL_SOLUTION | ORAL | Status: DC | PRN

## 2016-04-23 MED ORDER — VANCOMYCIN HCL IN DEXTROSE 1-5 GM/200ML-% IV SOLN
INTRAVENOUS | Status: AC
  Filled 2016-04-23: qty 200

## 2016-04-23 MED ORDER — FENTANYL CITRATE (PF) 100 MCG/2ML IJ SOLN
100.0000 ug | Freq: Once | INTRAMUSCULAR | Status: AC
  Administered 2016-04-23: 100 ug via INTRAVENOUS

## 2016-04-23 MED ORDER — HYDROCODONE-ACETAMINOPHEN 5-325 MG PO TABS
1.0000 | ORAL_TABLET | ORAL | Status: DC | PRN
  Administered 2016-04-24: 1 via ORAL
  Administered 2016-04-24: 2 via ORAL
  Administered 2016-04-25: 1 via ORAL
  Administered 2016-04-25 (×2): 2 via ORAL
  Administered 2016-04-26 (×4): 1 via ORAL
  Administered 2016-04-27 (×2): 2 via ORAL
  Administered 2016-04-28 (×2): 1 via ORAL
  Administered 2016-04-28 – 2016-04-29 (×2): 2 via ORAL
  Administered 2016-04-29: 1 via ORAL
  Administered 2016-04-29 – 2016-04-30 (×4): 2 via ORAL
  Administered 2016-04-30 (×2): 1 via ORAL
  Administered 2016-05-01: 2 via ORAL
  Filled 2016-04-23 (×4): qty 1
  Filled 2016-04-23 (×3): qty 2
  Filled 2016-04-23: qty 1
  Filled 2016-04-23: qty 2
  Filled 2016-04-23 (×2): qty 1
  Filled 2016-04-23 (×6): qty 2
  Filled 2016-04-23 (×3): qty 1
  Filled 2016-04-23: qty 2
  Filled 2016-04-23 (×2): qty 1
  Filled 2016-04-23: qty 2
  Filled 2016-04-23: qty 1

## 2016-04-23 MED ORDER — PROPOFOL 10 MG/ML IV BOLUS
INTRAVENOUS | Status: DC | PRN
  Administered 2016-04-23: 30 mg via INTRAVENOUS
  Administered 2016-04-23: 170 mg via INTRAVENOUS

## 2016-04-23 MED ORDER — METOCLOPRAMIDE HCL 5 MG/ML IJ SOLN: 5.0000 mg | Freq: Three times a day (TID) | INTRAMUSCULAR | Status: DC | PRN

## 2016-04-23 SURGICAL SUPPLY — 65 items
BANDAGE ACE 3X5.8 VEL STRL LF (GAUZE/BANDAGES/DRESSINGS) ×2 IMPLANT
BANDAGE ACE 4X5 VEL STRL LF (GAUZE/BANDAGES/DRESSINGS) ×2 IMPLANT
BANDAGE ELASTIC 3 VELCRO ST LF (GAUZE/BANDAGES/DRESSINGS) ×2 IMPLANT
BIT DRILL 2.0 LNG QUCK RELEASE (BIT) ×1 IMPLANT
BIT DRILL 2.8X5 QR DISP (BIT) ×2 IMPLANT
BLADE SURG ROTATE 9660 (MISCELLANEOUS) IMPLANT
BNDG ESMARK 4X9 LF (GAUZE/BANDAGES/DRESSINGS) ×2 IMPLANT
BNDG GAUZE ELAST 4 BULKY (GAUZE/BANDAGES/DRESSINGS) ×2 IMPLANT
CORDS BIPOLAR (ELECTRODE) ×2 IMPLANT
COVER SURGICAL LIGHT HANDLE (MISCELLANEOUS) ×2 IMPLANT
CUFF TOURNIQUET SINGLE 18IN (TOURNIQUET CUFF) ×2 IMPLANT
CUFF TOURNIQUET SINGLE 24IN (TOURNIQUET CUFF) IMPLANT
DECANTER SPIKE VIAL GLASS SM (MISCELLANEOUS) IMPLANT
DRAIN TLS ROUND 10FR (DRAIN) IMPLANT
DRAPE OEC MINIVIEW 54X84 (DRAPES) IMPLANT
DRAPE SURG 17X23 STRL (DRAPES) ×2 IMPLANT
DRILL 2.0 LNG QUICK RELEASE (BIT) ×2
GAUZE SPONGE 4X4 12PLY STRL (GAUZE/BANDAGES/DRESSINGS) ×2 IMPLANT
GAUZE XEROFORM 1X8 LF (GAUZE/BANDAGES/DRESSINGS) ×2 IMPLANT
GLOVE BIO SURGEON STRL SZ7.5 (GLOVE) IMPLANT
GLOVE BIOGEL PI IND STRL 8 (GLOVE) ×1 IMPLANT
GLOVE BIOGEL PI INDICATOR 8 (GLOVE) ×1
GOWN STRL REUS W/ TWL LRG LVL3 (GOWN DISPOSABLE) ×3 IMPLANT
GOWN STRL REUS W/ TWL XL LVL3 (GOWN DISPOSABLE) ×3 IMPLANT
GOWN STRL REUS W/TWL LRG LVL3 (GOWN DISPOSABLE) ×3
GOWN STRL REUS W/TWL XL LVL3 (GOWN DISPOSABLE) ×3
GUIDEWIRE ORTHO 0.054X6 (WIRE) ×6 IMPLANT
KIT BASIN OR (CUSTOM PROCEDURE TRAY) ×2 IMPLANT
KIT ROOM TURNOVER OR (KITS) ×2 IMPLANT
LOOP VESSEL MAXI BLUE (MISCELLANEOUS) IMPLANT
MANIFOLD NEPTUNE II (INSTRUMENTS) ×2 IMPLANT
NEEDLE 22X1 1/2 (OR ONLY) (NEEDLE) IMPLANT
NS IRRIG 1000ML POUR BTL (IV SOLUTION) ×2 IMPLANT
PACK ORTHO EXTREMITY (CUSTOM PROCEDURE TRAY) ×2 IMPLANT
PAD ARMBOARD 7.5X6 YLW CONV (MISCELLANEOUS) ×4 IMPLANT
PAD CAST 3X4 CTTN HI CHSV (CAST SUPPLIES) ×2 IMPLANT
PAD CAST 4YDX4 CTTN HI CHSV (CAST SUPPLIES) ×1 IMPLANT
PADDING CAST COTTON 3X4 STRL (CAST SUPPLIES) ×2
PADDING CAST COTTON 4X4 STRL (CAST SUPPLIES) ×1
PLATE R NARROW PROC VDR (Plate) ×2 IMPLANT
SCREW BN FT 16X2.3XLCK HEX CRT (Screw) ×1 IMPLANT
SCREW CORT FT 20X2.3XLCK HEX (Screw) ×1 IMPLANT
SCREW CORTICAL LOCKING 2.3X16M (Screw) ×1 IMPLANT
SCREW CORTICAL LOCKING 2.3X18M (Screw) ×3 IMPLANT
SCREW CORTICAL LOCKING 2.3X20M (Screw) ×2 IMPLANT
SCREW FX18X2.3XSMTH LCK NS CRT (Screw) ×3 IMPLANT
SCREW FX20X2.3XSMTH LCK NS CRT (Screw) ×1 IMPLANT
SCREW NLCKG 13 3.5X13 HEXA (Screw) ×2 IMPLANT
SCREW NON LOCK 3.5X10MM (Screw) ×2 IMPLANT
SCREW NON-LOCK 3.5X13 (Screw) ×4 IMPLANT
SCREW NONLOCK HEX 3.5X12 (Screw) ×2 IMPLANT
SPLINT PLASTER EXTRA FAST 3X15 (CAST SUPPLIES) ×1
SPLINT PLASTER GYPS XFAST 3X15 (CAST SUPPLIES) ×1 IMPLANT
SPONGE LAP 4X18 X RAY DECT (DISPOSABLE) IMPLANT
SUT MNCRL AB 4-0 PS2 18 (SUTURE) ×2 IMPLANT
SUT PROLENE 3 0 PS 2 (SUTURE) IMPLANT
SUT VIC AB 3-0 FS2 27 (SUTURE) IMPLANT
SYR CONTROL 10ML LL (SYRINGE) IMPLANT
SYSTEM CHEST DRAIN TLS 7FR (DRAIN) IMPLANT
TOWEL OR 17X24 6PK STRL BLUE (TOWEL DISPOSABLE) ×2 IMPLANT
TOWEL OR 17X26 10 PK STRL BLUE (TOWEL DISPOSABLE) ×2 IMPLANT
TUBE CONNECTING 12X1/4 (SUCTIONS) ×2 IMPLANT
TUBE EVACUATION TLS (MISCELLANEOUS) IMPLANT
UNDERPAD 30X30 (UNDERPADS AND DIAPERS) ×2 IMPLANT
WATER STERILE IRR 1000ML POUR (IV SOLUTION) ×2 IMPLANT

## 2016-04-23 NOTE — Op Note (Signed)
04/20/2016 - 04/23/2016 [facility] OR  Operative Note  Pre Op Diagnosis: Right comminuted intraarticular distal radius fracture  Post Op Diagnosis: Right comminuted intraarticular distal radius fracture  Procedure: ORIF Right comminuted intraarticular distal radius fracture, 3 intraarticular fragments  Surgeon: Betha LoaKevin Sakina Briones, MD  Assistant: Cindee SaltGary Trenyce Loera, MD  Anesthesia: General and Regional  Fluids: Per anesthesia flow sheet  EBL: minimal  Complications: None  Specimen: None  Tourniquet Time: * Missing tourniquet times found for documented tourniquets in log:  [id number] *  Disposition: Stable to PACU  INDICATIONS: INDICATIONS: @ is a 70 y.o. female who fell one week ago injuring her right wrist.  She was seen in the ED where xrays revealed a distal radius fracture.  She was admitted for other injuries and is now in inpatient rehab.  she and her husband and I discussed nonoperative and operative treatment options. She wished to proceed with operative fixation.  Risks, benefits, and alternatives of surgery were discussed including the risk of blood loss; infection; damage to nerves, vessels, tendons, ligaments, bone; failure of surgery; need for additional surgery; complications with wound healing; continued pain; nonunion; malunion; stiffness.  We also discussed the possible need for bone graft and the benefits and risks including the possibility of disease transmission.  She voiced understanding of these risks and elected to proceed.   OPERATIVE COURSE:  After being identified preoperatively by myself, the patient and I agreed upon the procedure and site of procedure.  Surgical site was marked.  The risks, benefits and alternatives of the surgery were reviewed and she wished to proceed.  Surgical consent had been signed.  She was given IV vancomycin as preoperative antibiotic prophylaxis.  She was transferred to the operating room and placed on the operating room table in supine position with the  Right upper extremity on an armboard. General and Regional anesthesia was induced by the anesthesiologist.  The Right upper extremity was prepped and draped in normal sterile orthopedic fashion.  A surgical pause was performed between the surgeons, anesthesia and operating room staff, and all were in agreement as to the patient, procedure and site of procedure.  Tourniquet at the proximal aspect of the extremity was inflated to 250 mmHg after exsanguination of the limb with an Esmarch bandage.  Standard volar Sherilyn CooterHenry approach was used.  The bipolar electrocautery was used to obtain hemostasis.  The superficial and deep portions of the FCR tendon sheath were incised, and the FCR and FPL were swept ulnarly to protect the palmar cutaneous branch of the median nerve.  The brachioradialis was released at the radial side of the radius.  The pronator quadratus was released and elevated with the periosteal elevator.  The fracture site was identified and cleared of soft tissue interposition and hematoma.  It was reduced under direct visualization.  There was intraarticular extension.   An AcuMed volar distal radial locking plate was selected.  It was secured to the bone with the guidepins.  C-arm was used in AP and lateral projections to ensure appropriate reduction and position of the hardware and adjustments made as necessary.  Standard AO drilling and measuring technique was used.  A single screw was placed in the slotted hole in the shaft of the plate.  The distal holes were filled with locking pegs with the exception of the styloid holes, which were filled with locking screws.  The remaining holes in the shaft of the plate were filled with nonlocking screws.  Good purchase was obtained.  C-arm was used  in AP, lateral and oblique projections to ensure appropriate reduction and position of hardware, which was the case.  There was no intra-articular penetration.  The wound was copiously irrigated with sterile saline.   Pronator quadratus was repaired back over top of the plate using 4-0 Vicryl suture.  Vicryl suture was placed in the subcutaneous tissues in an inverted interrupted fashion and the skin was closed with 4-0 nylon in a horizontal mattress fashion.  There was good pronation and supination of the wrist without crepitance.  The wound was then dressed with sterile Xeroform, 4x4s, and wrapped with a Kerlix bandage.  A volar splint was placed and wrapped with Kerlix and Ace bandage.  Tourniquet was deflated at 52 minutes.  Fingertips were pink with brisk capillary refill after deflation of the tourniquet.  Operative drapes were broken down.  The patient was awoken from anesthesia safely.  He was transferred back to the stretcher and taken to the PACU in stable condition.  She will return to inpatient rehab.

## 2016-04-23 NOTE — Anesthesia Procedure Notes (Signed)
Anesthesia Regional Block:  Supraclavicular block  Pre-Anesthetic Checklist: ,, timeout performed, Correct Patient, Correct Site, Correct Laterality, Correct Procedure, Correct Position, site marked, Risks and benefits discussed,  Surgical consent,  Pre-op evaluation,  At surgeon's request and post-op pain management  Laterality: Right  Prep: chloraprep       Needles:  Injection technique: Single-shot  Needle Type: Echogenic Stimulator Needle     Needle Length: 5cm 5 cm Needle Gauge: 22 and 22 G    Additional Needles:  Procedures: ultrasound guided (picture in chart) and nerve stimulator Supraclavicular block  Nerve Stimulator or Paresthesia:  Response: bicep contraction, 0.45 mA,   Additional Responses:   Narrative:  Start time: 04/23/2016 3:57 PM End time: 04/23/2016 4:07 PM Injection made incrementally with aspirations every 5 mL.  Performed by: Personally  Anesthesiologist: Heather RobertsSINGER, Devario Bucklew  Additional Notes: Functioning IV was confirmed and monitors applied.  A 50mm 22ga echogenic arrow stimulator was used. Sterile prep and drape,hand hygiene and sterile gloves were used.Ultrasound guidance: relevant anatomy identified, needle position confirmed, local anesthetic spread visualized around nerve(s)., vascular puncture avoided.  Image printed for medical record.  Negative aspiration and negative test dose prior to incremental administration of local anesthetic. The patient tolerated the procedure well.

## 2016-04-23 NOTE — Op Note (Signed)
I assisted Surgeon(s) and Role:    * Betha LoaKevin Feleshia Zundel, MD - Primary    * Cindee SaltGary Geriann Lafont, MD - Assisting on the Procedure(s): OPEN REDUCTION INTERNAL FIXATION (ORIF)  RIGHT DISTAL RADIAL FRACTURE on 04/20/2016 - 04/23/2016.  I provided assistance on this case as follows:approach, retraction, debridement of the fracture, reduction of the fracture, stabilization of the fracture, application f plate and screws, closure, and application of the dressing and splint. I was present for the entire case.  Electronically signed by: Nicki ReaperKUZMA,Zaheer Wageman R, MD Date: 04/23/2016 Time: 6:55 PM

## 2016-04-23 NOTE — Progress Notes (Signed)
Lab in to draw blood as ordered.  Patient refused blood draw.  Patient stated " No... Not now, not in the morning, not any time".  Will make another attempt to draw labs after patient has rested.  Kelli HopeBarber, Jameis Newsham M

## 2016-04-23 NOTE — H&P (Signed)
Jillian Morris is an 70 y.o. female.   Chief Complaint: right distal radius fracture HPI: 70 yo rhd female present with husband states she fell from standing height 04/16/16 injuring right wrist.  [facility] revealed distal radius fracture.  Admitted for other issues and now in inpatient rehab.  She wishes to undergo operative fixation of the fracture.  Allergies:  Allergies  Allergen Reactions  . Actonel [Risedronate Sodium]     GI Upset  . Ambien [Zolpidem Tartrate] Other (See Comments)    Went crazy, hallucinations  . Augmentin [Amoxicillin-Pot Clavulanate] Other (See Comments)    GI upset  . Depakote [Divalproex Sodium]     Hallucinations  . Inderal [Propranolol] Other (See Comments)    unknown  . Nsaids Other (See Comments)    Pt. Had gastric bypass surgery   . Penicillins Other (See Comments)    Unknown childhood reaction  . Silicon Nausea And Vomiting    Thyroid storm  . Adhesive [Tape] Itching and Rash    Please use "paper" tape  . Latex Swelling and Rash    Redness lips  . Nickel Itching and Rash    Other reaction(s): Contact Dermatitis (intolerance)    Past Medical History:  Diagnosis Date  . Acne rosacea   . Anemia    s/p gastric bypass, malabsorption  . Anxiety   . Arthritis   . B12 deficiency   . Chronic fatigue fibromyalgia syndrome   . Chronic low back pain   . Depression   . Fibromyalgia   . GERD (gastroesophageal reflux disease)   . Graves disease   . H/O hiatal hernia   . Hypothyroidism   . IBS (irritable bowel syndrome)   . Lymphedema   . Meralgia paraesthetica   . Sjogren's syndrome (HCC)   . Status post dilation of esophageal narrowing   . Stroke [facility](HCC)     Past Surgical History:  Procedure Laterality Date  . ABDOMINAL HYSTERECTOMY    . APPENDECTOMY    . BALLOON DILATION  03/30/2012   Procedure: BALLOON DILATION;  Surgeon: Louis Meckelobert D Kaplan, MD;  Location: [facility];  Service: Endoscopy;  Laterality: N/A;  . BOTOX INJECTION  N/A 12/26/2014   Procedure: BOTOX INJECTION;  Surgeon: Louis Meckelobert D Kaplan, MD;  Location: [facility];  Service: Endoscopy;  Laterality: N/A;  . ENDOMETRIAL ABLATION     x 4  . ESOPHAGEAL MANOMETRY N/A 05/15/2013   Procedure: ESOPHAGEAL MANOMETRY (EM);  Surgeon: Louis Meckelobert D Kaplan, MD;  Location: [facility];  Service: Endoscopy;  Laterality: N/A;  . ESOPHAGOGASTRODUODENOSCOPY  03/30/2012   Procedure: ESOPHAGOGASTRODUODENOSCOPY (EGD);  Surgeon: Louis Meckelobert D Kaplan, MD;  [facility];  Service: Endoscopy;  Laterality: N/A;  . ESOPHAGOGASTRODUODENOSCOPY  05/04/2012   Procedure: ESOPHAGOGASTRODUODENOSCOPY (EGD);  Surgeon: Louis Meckelobert D Kaplan, MD;  [facility];  Service: Endoscopy;  Laterality: N/A;  . ESOPHAGOGASTRODUODENOSCOPY N/A 12/26/2014   Procedure: ESOPHAGOGASTRODUODENOSCOPY (EGD);  Surgeon: Louis Meckelobert D Kaplan, MD;  [facility];  Service: Endoscopy;  Laterality: N/A;  botox injection  . GASTRIC BYPASS    . LAMINECTOMY     L3-L5  . ORIF TRIPOD FRACTURE Right 04/19/2016   Procedure: OPEN REDUCTION INTERNAL FIXATION (ORIF) TRIPOD FRACTURE LIEBINGER MIDFACE NO DRILL;  Surgeon: Newman PiesSu Teoh, MD;  Location: [facility] OR;  Service: ENT;  Laterality: Right;  . 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 THYROIDECTOMY      Family History: Family History  Problem Relation Age of Onset  . Prostate  cancer Maternal Grandfather   . Lymphoma Maternal Uncle   . Heart disease Father   . Heart disease Sister   . Irritable bowel syndrome Sister   . Dementia Mother   . Stroke Maternal Grandmother   . Appendicitis Paternal Grandmother   . Colon cancer Neg Hx     Social History:   reports that she has never smoked. She has never used smokeless tobacco. She reports that she does not drink alcohol or use drugs.  Medications: Medications Prior to Admission  Medication Sig Dispense Refill  . ALPRAZolam (XANAX) 0.25 MG tablet Take 0.25 mg by mouth 2 (two) times daily as needed (for panic attacks).      . cyanocobalamin (,VITAMIN B-12,) 1000 MCG/ML injection Inject 1,000 mcg into the muscle every 30 (thirty) days.     Marland Kitchen dexlansoprazole (DEXILANT) 60 MG capsule TAKE 1 CAPSULE (60 MG TOTAL) BY MOUTH DAILY. (Patient not taking: Reported on 04/16/2016) 90 capsule 3  . DULoxetine (CYMBALTA) 60 MG capsule Take 60 mg by mouth 2 (two) times daily.    Marland Kitchen lamoTRIgine (LAMICTAL) 100 MG tablet Take 300 mg by mouth at bedtime.    . lidocaine (LIDODERM) 5 % Place 1 patch onto the skin daily. Remove & Discard patch within 12 hours or as directed by MD    . LORazepam (ATIVAN) 1 MG tablet Take 1 mg by mouth at bedtime.     . metroNIDAZOLE (METROCREAM) 0.75 % cream Apply 1 application topically 2 (two) times daily.     Marland Kitchen morphine (MS CONTIN) 30 MG 12 hr tablet Take 30 mg by mouth every 12 (twelve) hours.     . mupirocin cream (BACTROBAN) 2 % 1 application.    Marland Kitchen oxymorphone (OPANA) 10 MG tablet Take 10 mg by mouth every 6 (six) hours as needed for pain.    . ranitidine (ZANTAC) 300 MG tablet Take 300 mg by mouth at bedtime.     Marland Kitchen rOPINIRole (REQUIP) 1 MG tablet 1 TABLET (1 mg) BEFORE BEDTIME as needed for restless leg syndrome  6  . SYNTHROID 125 MCG tablet TAKE 1 TABLET (125 mcg) BY MOUTH EVERY MORNING ON EMPTY STOMACH ONCE DAILY  3    No results found for this or any previous visit (from the past 48 hour(s)).  No results found.   A comprehensive review of systems was negative.  There were no vitals taken for this visit.  General appearance: alert, cooperative and appears stated age Head: Normocephalic, without obvious abnormality Neck: supple, symmetrical, trachea midline Resp: clear to auscultation bilaterally Cardio: regular rate and rhythm GI: non-tender Extremities: Intact sensation and capillary refill all digits.  +epl/fpl/io.  No wounds.  Pulses: 2+ and symmetric Skin: Skin color, texture, turgor normal. No rashes or lesions Neurologic: Grossly  normal Incision/Wound:none  Assessment/Plan Right distal radius fracture.  Non operative and operative treatment options were discussed with the patient and patient wishes to proceed with operative treatment. Risks, benefits, and alternatives of surgery were discussed and the patient agrees with the plan of care.   Ramond Darnell R 04/23/2016, 10:27 AM

## 2016-04-23 NOTE — Progress Notes (Signed)
Physical Therapy Session Note  Patient Details  Name: Elie GoodyBrenda L Fier MRN: [id number] Date of Birth: 09/27/1945  Today's Date: 04/23/2016 PT Individual Time: 0907-1000 PT Individual Time Calculation (min): 53 min    Short Term Goals: Week 1:  PT Short Term Goal 1 (Week 1): STG = LTG due to short ELOS.  Skilled Therapeutic Interventions/Progress Updates:    Patient in recliner upon arrival, upset about not knowing if she is having surgery this date. Patient required multiple attempts to stand from low recliner with supervision. Gait training using SPC x 150 ft with close supervision and verbal cues for safe use of SPC. Verified with RN plan for surgery this PM. Instructed in FTSS from arm chair without UE support = 15 sec. For dynamic balance retraining, instructed in 4-square step test in clockwise and counterclockwise directions multiple trials with and without SPC with mirror for visual feedback. Patient required supervision-steady assist with improved ability to step over targets with repetition. Stair training up/down 12 stairs using L rail only with max verbal cues for step-to pattern due to c/o increased L knee pain but patient unable to follow commands and demonstrated reciprocal pattern, stating, "I shouldn't have done that," reinforced recommendation for step-to pattern leading with RLE for pain management. Patient NPO but requesting to rinse mouth out with water. Patient ambulated without device to retrieve cup of water to carry back to room and rinsed out mouth. Gait training without device 2 x 150 ft with supervision with cues for attention to task. Performed NuStep using BLE only at level 3 x 6 min for strengthening and endurance. Patient left sitting in recliner with BLE elevated and needs in reach with neuropsychologist present.   Therapy Documentation Precautions:  Precautions Precautions: Fall Required Braces or Orthoses: Sling Restrictions Weight Bearing Restrictions: Yes RUE  Weight Bearing: Non weight bearing Other Position/Activity Restrictions: sling for comfort Pain: Pain Assessment Pain Assessment: 0-10 Pain Score: 6  Pain Type: Acute pain Pain Location: Arm Pain Orientation: Right Pain Descriptors / Indicators: Aching Pain Onset: On-going Pain Intervention(s): Rest:     See Function Navigator for Current Functional Status.   Therapy/Group: Individual Therapy  Kerney ElbeVarner, Yarely Bebee A 04/23/2016, 9:52 AM

## 2016-04-23 NOTE — Progress Notes (Signed)
[facility] PHYSICAL MEDICINE & REHABILITATION     PROGRESS NOTE    Subjective/Complaints: Up working with OT. Hungry! Able to sleep last night. Pain under better control.  ROS: Pt denies fever, rash/itching, headache, blurred or double vision, nausea, vomiting, abdominal pain, diarrhea, chest pain, shortness of breath, palpitations, dysuria, dizziness, neck or back pain, bleeding, anxiety, or depression   Objective: Vital Signs: Blood pressure 134/72, pulse 83, temperature 98.9 F (37.2 C), temperature source Oral, resp. rate 16, height 5\' 4"  (1.626 m), weight 85.7 kg (189 lb), SpO2 91 %. No results found.  Recent Labs  04/21/16 0458  WBC 4.4  HGB 10.0*  HCT 30.7*  PLT 157    Recent Labs  04/21/16 0458  NA 142  K 3.8  CL 106  GLUCOSE 99  BUN 7  CREATININE 0.85  CALCIUM 8.9   CBG (last 3)  No results for input(s): GLUCAP in the last 72 hours.  Wt Readings from Last 3 Encounters:  04/20/16 85.7 kg (189 lb)  04/16/16 85.7 kg (188 lb 15 oz)  03/09/16 82.6 kg (182 lb)    Physical Exam:  Gen: NAD. Well-developed. Vital signs reviewed. HENT: no diplopia.  Multiple bruising to the face/neck improving. Injected right sclera. Eye guard over right maxilla Neck: Normal range of motion. Neck supple. No thyromegalypresent.  Cardiovascular: Normal rateand regular rhythm.  Respiratory: Effort normaland breath sounds normal. No respiratory distress.  GI: Soft. Bowel sounds are normal. She exhibits no distension.  Musculoskeletal:    B/l LE edema Neurological: She is alert. Tracks to all fields Oriented to person place and time with cueing. Follows commands. Improving insight  Sensation intact to light touch. Motor: LUE: 4+/5 proximal to distal. RUE Limited by splint B/l LE: Hip flexion 4-/5, knee extension 4/5, ankle dorsi/plantar flexion 4+/5 Skin: Skin is warmand dry  Assessment/Plan: 1. 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 Physiatrist is providing close team supervision and 24 hour management of active medical problems listed below. Physiatrist and rehab team continue to assess barriers to discharge/monitor patient progress toward functional and medical goals.  Function:  Bathing Bathing position   Position: Wheelchair/chair at sink  Bathing parts Body parts bathed by patient: Chest, Abdomen, Right arm, Front perineal area, Buttocks, Right upper leg, Left upper leg, Right lower leg, Left lower leg Body parts bathed by helper: Left arm, Back  Bathing assist Assist Level: Touching or steadying assistance(Pt > 75%)      Upper Body Dressing/Undressing Upper body dressing   What is the patient wearing?: Bra, Pull over shirt/dress Bra - Perfomed by patient: Thread/unthread right bra strap, Thread/unthread left bra strap, Hook/unhook bra (pull down sports bra)   Pull over shirt/dress - Perfomed by patient: Thread/unthread right sleeve, Thread/unthread left sleeve, Put head through opening, Pull shirt over trunk          Upper body assist Assist Level: Supervision or verbal cues      Lower Body Dressing/Undressing Lower body dressing   What is the patient wearing?: Pants Underwear - Performed by patient: Thread/unthread right underwear leg, Thread/unthread left underwear leg, Pull underwear up/down   Pants- Performed by patient: Thread/unthread right pants leg, Thread/unthread left pants leg, Pull pants up/down Pants- Performed by helper: Fasten/unfasten pants Non-skid slipper socks- Performed by patient: Don/doff right sock, Don/doff left sock  Lower body assist Assist for lower body dressing: Supervision or verbal cues      Toileting Toileting   Toileting steps completed by patient: Adjust clothing prior to toileting, Performs perineal hygiene, Adjust clothing after toileting    Toileting Assistive Devices: Grab bar or rail  Toileting assist Assist level: Supervision or verbal cues   Transfers Chair/bed transfer   Chair/bed transfer method: Ambulatory Chair/bed transfer assist level: Supervision or verbal cues Chair/bed transfer assistive device: Hospital doctorCane     Locomotion Ambulation     Max distance: 150 ft Assist level: Supervision or verbal cues   Wheelchair Wheelchair activity did not occur: Safety/medical concerns        Cognition Comprehension Comprehension assist level: Understands complex 90% of the time/cues 10% of the time  Expression Expression assist level: Expresses complex 90% of the time/cues < 10% of the time  Social Interaction Social Interaction assist level: Interacts appropriately 90% of the time - Needs monitoring or encouragement for participation or interaction.  Problem Solving Problem solving assist level: Solves basic 90% of the time/requires cueing < 10% of the time  Memory Memory assist level: Recognizes or recalls 50 - 74% of the time/requires cueing 25 - 49% of the time   Medical Problem List and Plan: 1. TBI/SAH/right distal radius fracture, right maxillary right zygomatic arch fracture  secondary to fall 04/16/2016                       -pt making functional gains 2.  DVT Prophylaxis/Anticoagulation: SCDs. Monitor for any signs of DVT 3. Pain Management/Fibromyalgia: MS Contin 30 mg every 12 hours, MSIR 30 mg every 6 hours as needed  -observe for neurosedating effects  -pain control seems to be improving 4. Mood: Cymbalta 60 mg twice a day, Ativan 1 mg daily at bedtime--adjust to 9pm, Requip 1 mg daily at bedtime as needed, Xanax 0.25 mg twice daily as needed.  -nights have been improving 5. Neuropsych: This patient is capable of making decisions on her own behalf. 6. Skin/Wound Care: Routine skin checks 7. Fluids/Electrolytes/Nutrition: labs all personally reviewed. All within normal limits upon admit 8. Right comminuted  dorsally impacted fracture of the distal radius. No plan for surgical intervention at this time. Currently nonweightbearing.  -surgical repair today per Dr. Merlyn LotKuzma 9. Right maxillary zygomatic arch fracture. Follow-up per Dr. Suszanne Connerseoh with conservative care   LOS (Days) 3 A FACE TO FACE EVALUATION WAS PERFORMED  SWARTZ,ZACHARY T 04/23/2016 10:54 AM

## 2016-04-23 NOTE — Anesthesia Procedure Notes (Signed)
Procedure Name: LMA Insertion Date/Time: 04/23/2016 5:41 PM Performed by: Jillian Morris, Jillian Markham J Pre-anesthesia Checklist: Patient identified Patient Re-evaluated:Patient Re-evaluated prior to inductionOxygen Delivery Method: Circle system utilized Preoxygenation: Pre-oxygenation with 100% oxygen Intubation Type: IV induction Ventilation: Mask ventilation without difficulty LMA: LMA inserted LMA Size: 4.0 Number of attempts: 1 Placement Confirmation: positive ETCO2 and breath sounds checked- equal and bilateral Tube secured with: Tape Dental Injury: Teeth and Oropharynx as per pre-operative assessment

## 2016-04-23 NOTE — Anesthesia Postprocedure Evaluation (Signed)
Anesthesia Post Note  Patient: Elie GoodyBrenda L Boden  Procedure(s) Performed: Procedure(s) (LRB): OPEN REDUCTION INTERNAL FIXATION (ORIF)  RIGHT DISTAL RADIAL FRACTURE (Right)  Patient location during evaluation: PACU Anesthesia Type: General and Regional Level of consciousness: awake Pain management: pain level controlled Vital Signs Assessment: post-procedure vital signs reviewed and stable Respiratory status: spontaneous breathing Cardiovascular status: stable Postop Assessment: no signs of nausea or vomiting Anesthetic complications: no    Last Vitals:  Vitals:   04/23/16 2030 04/23/16 2130  BP: (!) 119/53 (!) 146/67  Pulse: 82 79  Resp: 18 18  Temp: 36.7 C 36.8 C    Last Pain:  Vitals:   04/23/16 2130  TempSrc: Oral  PainSc:                  Treyana Sturgell

## 2016-04-23 NOTE — IPOC Note (Signed)
Overall Plan of Care [facility]) Patient Details Name: Jillian Morris MRN: [id number] DOB: 1946-05-18  Admitting Diagnosis: trauma  [facility]  Hospital Problems: Principal Problem:   Traumatic subarachnoid bleed with LOC of 1 hour to 5 hours 59 minutes (HCC) Active Problems:   Multiple facial fractures (HCC)   Closed fracture of right distal radius   Fibromyalgia     Functional Problem List: Nursing Behavior, Edema, Endurance, Medication Management, Motor, Nutrition, Pain, Safety, Skin Integrity  PT Balance, Endurance, Motor, Safety, Pain  OT Balance, Behavior, Safety, Cognition, Edema, Endurance, Pain  SLP Cognition, Safety  TR         Basic ADL's: OT Eating, Grooming, Bathing, Dressing, Toileting     Advanced  ADL's: OT Simple Meal Preparation     Transfers: PT Bed Mobility, Bed to Chair  OT Toilet, Tub/Shower     Locomotion: PT Ambulation, Wheelchair Mobility, Stairs     Additional Impairments: OT Fuctional Use of Upper Extremity  SLP Social Cognition   Problem Solving, Memory, Attention, Awareness  TR      Anticipated Outcomes Item Anticipated Outcome  Self Feeding Set-up  Swallowing      Basic self-care  Supervision  Toileting  Supervision   Bathroom Transfers Supervision  Bowel/Bladder  Patient to remain continent while on Rehab with Min assist  Transfers  Supervision  Locomotion  Supervision with LRAD  Communication     Cognition  Supervision   Pain  < 4 on 0-10 pain scale   Safety/Judgment  Patient will remain free from falls while on Rehab with Min Assist   Therapy Plan: PT Intensity: Minimum of 1-2 x/day ,45 to 90 minutes PT Frequency: 5 out of 7 days PT Duration Estimated Length of Stay: 7-10 days OT Intensity: Minimum of 1-2 x/day, 45 to 90 minutes OT Frequency: 5 out of 7 days OT Duration/Estimated Length of Stay: 10-12 days SLP Intensity: Minumum of 1-2 x/day, 30 to 90 minutes SLP Frequency: 3 to 5 out of 7 days SLP  Duration/Estimated Length of Stay: 9/9       Team Interventions: Nursing Interventions Bowel Management, Patient/Family Education, Disease Management/Prevention, Pain Management, Medication Management, Skin Care/Wound Management, Cognitive Remediation/Compensation, Discharge Planning  PT interventions Ambulation/gait training, Balance/vestibular training, Cognitive remediation/compensation, Community reintegration, Fish farm manager, Neuromuscular re-education, Psychosocial support, Stair training, UE/LE Strength taining/ROM, Wheelchair propulsion/positioning, UE/LE Coordination activities, Therapeutic Activities, Pain management, Discharge planning, Functional mobility training, Patient/family education, Therapeutic Exercise, Visual/perceptual remediation/compensation, Skin care/wound management, Disease management/prevention  OT Interventions Warden/ranger, Cognitive remediation/compensation, Firefighter, Fish farm manager, Discharge planning, Functional mobility training, Psychosocial support, Patient/family education, Pain management, Self Care/advanced ADL retraining, Splinting/orthotics, UE/LE Strength taining/ROM, Therapeutic Exercise, Therapeutic Activities, UE/LE Coordination activities, Skin care/wound managment, Visual/perceptual remediation/compensation  SLP Interventions Cognitive remediation/compensation, Cueing hierarchy, Environmental controls, Functional tasks, Internal/external aids, Medication managment, Patient/family education  TR Interventions    SW/CM Interventions Discharge Planning, Psychosocial Support, Patient/Family Education    Team Discharge Planning: Destination: PT-Home ,OT- Home , SLP-Home Projected Follow-up: PT-Home health PT, 24 hour supervision/assistance, OT-  Home health OT, SLP-24 hour supervision/assistance, Outpatient SLP Projected Equipment Needs: PT-To be determined, OT- To be determined, Tub/shower  bench, SLP-None recommended by SLP Equipment Details: PT- , OT-  Patient/family involved in discharge planning: PT- Patient,  OT-Patient, SLP-Patient, Family member/caregiver  MD ELOS: 10 days Medical Rehab Prognosis:  Excellent Assessment: The patient has been admitted for CIR therapies with the diagnosis of traumatic brain injury with right radial fracture. The team will be addressing functional  mobility, strength, stamina, balance, safety, adaptive techniques and equipment, self-care, bowel and bladder mgt, patient and caregiver education, NMR, cognitive perceptual rx, orthopedic precautions, pain mgt, ego suport. Goals have been set at supervision for self-care, mobility and cognitive tasks.    Ranelle OysterZachary T. Swartz, MD, FAAPMR      See Team Conference Notes for weekly updates to the plan of care

## 2016-04-23 NOTE — Care Management Note (Signed)
Inpatient Rehabilitation Center Individual Statement of Services  Patient Name:  Jillian Morris  Date:  04/22/2016  Welcome to the Inpatient Rehabilitation Center.  Our goal is to provide you with an individualized program based on your diagnosis and situation, designed to meet your specific needs.  With this comprehensive rehabilitation program, you will be expected to participate in at least 3 hours of rehabilitation therapies Monday-Friday, with modified therapy programming on the weekends.  Your rehabilitation program will include the following services:  Physical Therapy (PT), Occupational Therapy (OT), Speech Therapy (ST), 24 hour per day rehabilitation nursing, Therapeutic Recreaction (TR), Neuropsychology, Case Management (Social Worker), Rehabilitation Medicine, Nutrition Services and Pharmacy Services  Weekly team conferences will be held on Tuesdays to discuss your progress.  Your Social Worker will talk with you frequently to get your input and to update you on team discussions.  Team conferences with you and your family in attendance may also be held.  Expected length of stay: 10-12 days  Overall anticipated outcome: supervision  Depending on your progress and recovery, your program may change. Your Social Worker will coordinate services and will keep you informed of any changes. Your Social Worker's name and contact numbers are listed  below.  The following services may also be recommended but are not provided by the Inpatient Rehabilitation Center:   Driving Evaluations  Home Health Rehabiltiation Services  Outpatient Rehabilitation Services    Arrangements will be made to provide these services after discharge if needed.  Arrangements include referral to agencies that provide these services.  Your insurance has been verified to be:  [facility] Your primary doctor is:  Dr. Johny BlamerWilliam Harris  Pertinent information will be shared with your doctor and your insurance  company.  Social Worker:  LaBelleLucy Marshaun Lortie, [place] [phone] or ([phone]) [phone]   Information discussed with and copy given to patient by: Amada JupiterHOYLE, Sharron Petruska, 04/22/2016, 4:16 PM

## 2016-04-23 NOTE — Progress Notes (Signed)
Patient spouse states patient is supposed to have surgery tomorrow on right radial fracture by Dr. Merlyn LotKuzma.  No orders written for surgery.  RN called OR and patient is scheduled for surgery 8/31 at 1545.  Called Dr. Sheran FavaKuzmas office and left message for Gladstone Lighterlecia to have Dr. Merlyn LotKuzma put in preop orders.  Dani Gobbleeardon, Tajuana Kniskern J, RN

## 2016-04-23 NOTE — Progress Notes (Signed)
Speech Language Pathology Daily Session Note  Patient Details  Name: Jillian Morris Ratto MRN: [id number] Date of Birth: 04/15/1946  Today's Date: 04/23/2016 SLP Individual Time: 1100-1155 SLP Individual Time Calculation (min): 55 min   Short Term Goals: Week 1: SLP Short Term Goal 1 (Week 1): Patient will sustian attention to functional task for 10 minutes with Supervision level verbal cues SLP Short Term Goal 2 (Week 1): Patient will utilize external aids to assist with recall of new and daily information with Min verbal cues SLP Short Term Goal 3 (Week 1): Patient will label 2 cognitive change with Min question cues  SLP Short Term Goal 4 (Week 1): Patient will request help an needed with Min assist verbal cues SLP Short Term Goal 5 (Week 1): Patient will solve mildly complex problems with Min assist verbal and visual cues to recognize and coorect errors  Skilled Therapeutic Interventions:Skilled therapy intervention focused on cognitive goals. Patient completed mildly complex functional calendar task with Min A verbal cues for organization. Patient also required Min A verbal cues for organization during a sequenced recipe activity. Outside structured activities, patient was tangential and perseverative in conversation, demonstrating moderately impaired episodic recall of previous day's events. Patient independently recalled personal medical history and circumstances of upcoming arm surgery. Patient left upright in chair with all needs within reach and spouse in room. Continue current plan of care.     Function:  Cognition Comprehension Comprehension assist level: Understands complex 90% of the time/cues 10% of the time  Expression   Expression assist level: Expresses complex 90% of the time/cues < 10% of the time  Social Interaction Social Interaction assist level: Interacts appropriately with others with medication or extra time (anti-anxiety, antidepressant).  Problem Solving Problem  solving assist level: Solves basic 90% of the time/requires cueing < 10% of the time  Memory Memory assist level: Recognizes or recalls 50 - 74% of the time/requires cueing 25 - 49% of the time    Pain No/denies pain  Therapy/Group: Individual Therapy  Caryn Sectionlison [place] 04/23/2016, 12:22 PM

## 2016-04-23 NOTE — Brief Op Note (Signed)
04/20/2016 - 04/23/2016  6:55 PM  PATIENT:  Elie GoodyBrenda L Walthall  70 y.o. female  PRE-OPERATIVE DIAGNOSIS: comminuted intraarticular distal radius fracture, 3 intraarticular parts  POST-OPERATIVE DIAGNOSIS:  comminuted intraarticular distal radius fracture, 3 intraarticular parts  PROCEDURE:  Procedure(s): OPEN REDUCTION INTERNAL FIXATION (ORIF)  RIGHT DISTAL RADIAL FRACTURE (Right)  SURGEON:  Surgeon(s) and Role:    * Betha LoaKevin Buford Bremer, MD - Primary    * Cindee SaltGary Marvene Strohm, MD - Assisting  PHYSICIAN ASSISTANT:   ASSISTANTS: Cindee SaltGary Loretta Doutt, MD   ANESTHESIA:   regional and general  EBL:  Total I/O In: 1000 [I.V.:1000] Out: -   BLOOD ADMINISTERED:none  DRAINS: none   LOCAL MEDICATIONS USED:  NONE  SPECIMEN:  No Specimen  DISPOSITION OF SPECIMEN:  N/A  COUNTS:  YES  TOURNIQUET:  52 minutes at 250 mmHg  DICTATION: .Note written in EPIC  PLAN OF CARE: return to rehab  PATIENT DISPOSITION:  PACU - hemodynamically stable.   Delay start of Pharmacological VTE agent (>24hrs) due to surgical blood loss or risk of bleeding: no

## 2016-04-23 NOTE — Transfer of Care (Signed)
Immediate Anesthesia Transfer of Care Note  Patient: Jillian Morris  Procedure(s) Performed: Procedure(s): OPEN REDUCTION INTERNAL FIXATION (ORIF)  RIGHT DISTAL RADIAL FRACTURE (Right)  Patient Location: PACU  Anesthesia Type:GA combined with regional for post-op pain  Level of Consciousness: awake, alert , oriented and patient cooperative  Airway & Oxygen Therapy: Patient Spontanous Breathing and Patient connected to nasal cannula oxygen  Post-op Assessment: Report given to RN, Post -op Vital signs reviewed and stable and Left arm blocked  Post vital signs: Reviewed and stable  Last Vitals:  Vitals:   04/23/16 1620 04/23/16 1858  BP:  (!) 143/77  Pulse: 86 76  Resp: 12 (!) 21  Temp:  36.5 C    Last Pain:  Vitals:   04/23/16 1858  TempSrc:   PainSc: 0-No pain      Patients Stated Pain Goal: 2 (04/22/16 2147)  Complications: No apparent anesthesia complications

## 2016-04-23 NOTE — Anesthesia Preprocedure Evaluation (Addendum)
Anesthesia Evaluation  Patient identified by MRN, date of birth, ID band Patient awake    Reviewed: Allergy & Precautions, H&P , NPO status , Patient's Chart, lab work & pertinent test results, reviewed documented beta blocker date and time   History of Anesthesia Complications Negative for: history of anesthetic complications  Airway Mallampati: II  TM Distance: >3 FB Neck ROM: full    Dental no notable dental hx. (+) Dental Advisory Given, Edentulous Upper, Edentulous Lower   Pulmonary neg pulmonary ROS,    Pulmonary exam normal breath sounds clear to auscultation       Cardiovascular negative cardio ROS   Rhythm:regular Rate:Normal     Neuro/Psych PSYCHIATRIC DISORDERS Anxiety Depression CHI: Repeat CT scan 04/20/16: IMPRESSION: Stable small volume subarachnoid hemorrhage in the parafalcine right frontal lobe and left quadrigeminal plate cistern.  No new hemorrhage or acute abnormality.  No hydrocephalus.   Neuromuscular disease CVA    GI/Hepatic Neg liver ROS, hiatal hernia, GERD  ,  Endo/Other  Hypothyroidism   Renal/GU negative Renal ROS     Musculoskeletal  (+) Fibromyalgia -, narcotic dependent  Abdominal   Peds  Hematology   Anesthesia Other Findings   Reproductive/Obstetrics                           Anesthesia Physical Anesthesia Plan  ASA: III  Anesthesia Plan: General   Post-op Pain Management: GA combined w/ Regional for post-op pain   Induction: Intravenous  Airway Management Planned: LMA  Additional Equipment:   Intra-op Plan:   Post-operative Plan:   Informed Consent: I have reviewed the patients History and Physical, chart, labs and discussed the procedure including the risks, benefits and alternatives for the proposed anesthesia with the patient or authorized representative who has indicated his/her understanding and acceptance.   Dental Advisory Given  and Dental advisory given  Plan Discussed with: CRNA and Surgeon  Anesthesia Plan Comments:        Anesthesia Quick Evaluation                                   Anesthesia Evaluation  Patient identified by MRN, date of birth, ID band Patient awake    Reviewed: Allergy & Precautions, NPO status , Patient's Chart, lab work & pertinent test results  Airway Mallampati: II  TM Distance: >3 FB Neck ROM: Full    Dental  (+) Edentulous Upper, Edentulous Lower   Pulmonary    breath sounds clear to auscultation       Cardiovascular  Rhythm:Regular Rate:Normal     Neuro/Psych    GI/Hepatic   Endo/Other    Renal/GU      Musculoskeletal   Abdominal   Peds  Hematology   Anesthesia Other Findings   Reproductive/Obstetrics                             Anesthesia Physical Anesthesia Plan  ASA: III  Anesthesia Plan: General   Post-op Pain Management:    Induction: Intravenous  Airway Management Planned: Oral ETT  Additional Equipment:   Intra-op Plan:   Post-operative Plan: Extubation in OR  Informed Consent: I have reviewed the patients History and Physical, chart, labs and discussed the procedure including the risks, benefits and alternatives for the proposed anesthesia with the patient or authorized representative who has indicated his/her  understanding and acceptance.   Dental advisory given  Plan Discussed with: CRNA and Anesthesiologist  Anesthesia Plan Comments: (R. Orbital Fracture Small SAH stable  S/P gastric bypass  GERD  Plan GA with oral ETT  Kipp Brood)        Anesthesia Quick Evaluation

## 2016-04-23 NOTE — Progress Notes (Signed)
Pt missed 30 min of OT today due to already prepped and ready for sx.

## 2016-04-23 NOTE — Progress Notes (Signed)
Patient returned from OR after ORIF of right distal radial fracture.  Patient awake, alert and oriented x4.  Denies surgical pain.  States that her arm is "numb" and "Feels fine".  Fingers warm to touch and pink in color.  Soft cast and sling in place.  Able to insert one finger without difficulty.  Drinking fluids and requesting ice cream.  Husband at bedside.   Kelli HopeBarber, Sharyah Bostwick M

## 2016-04-23 NOTE — Progress Notes (Signed)
Occupational Therapy Session Note  Patient Details  Name: Jillian GoodyBrenda L Marinez MRN: [id number] Date of Birth: 12/07/1945  Today's Date: 04/23/2016 OT Individual Time: 0700-0813 OT Individual Time Calculation (min): 73 min     Short Term Goals: Week 1:  OT Short Term Goal 1 (Week 1): Pt will recall UB hemi-dressing technique with 1 VC in order to increase independence with ADLs OT Short Term Goal 2 (Week 1): Pt will complete 2 grooming tasks in standing in order to increase functional activity tolerance OT Short Term Goal 3 (Week 1): Pt will dress LB with supervision OT Short Term Goal 4 (Week 1): Pt will direct caregiver for proper donning technique of UE sling mod I  Skilled Therapeutic Interventions/Progress Updates:    Upon entering the room, pt seated on EOB with no c/o pain this session. Pt upset over upcoming procedure/surgical plans. However, pt agreeable to OT intervention this session. [facility] and [facility] to sink for bathing and dressing with overall supervision. Pt performed tasks from wheelchair with focus on sit <>stand. Pt provided with min verbal cues for proper technique regarding UB dressing. Pt able to direct therpist in donning UE sling this session. Pt requesting to returning to supine at end of session secondary to fatigue. Call bell and all needed items within reach upon exiting the room.   Therapy Documentation Precautions:  Precautions Precautions: Fall Required Braces or Orthoses: Sling Restrictions Weight Bearing Restrictions: Yes RUE Weight Bearing: Non weight bearing Other Position/Activity Restrictions: sling for comfort General:   Vital Signs:   Pain: Pain Assessment Pain Assessment: No/denies pain Pain Score: 6  Pain Type: Acute pain Pain Location: Arm Pain Orientation: Right Pain Descriptors / Indicators: Aching Pain Onset: On-going Pain Intervention(s): Rest ADL:   Exercises:   Other Treatments:    See Function  Navigator for Current Functional Status.   Therapy/Group: Individual Therapy  Lowella Gripittman, Alexia Dinger L 04/23/2016, 12:30 PM

## 2016-04-24 ENCOUNTER — Inpatient Hospital Stay (HOSPITAL_COMMUNITY): Payer: Medicare Other

## 2016-04-24 ENCOUNTER — Inpatient Hospital Stay (HOSPITAL_COMMUNITY): Payer: Medicare Other | Admitting: Occupational Therapy

## 2016-04-24 ENCOUNTER — Inpatient Hospital Stay (HOSPITAL_COMMUNITY): Payer: Medicare Other | Admitting: Speech Pathology

## 2016-04-24 ENCOUNTER — Encounter (HOSPITAL_COMMUNITY): Payer: Self-pay | Admitting: Orthopedic Surgery

## 2016-04-24 DIAGNOSIS — S066X3S Traumatic subarachnoid hemorrhage with loss of consciousness of 1 hour to 5 hours 59 minutes, sequela: Secondary | ICD-10-CM

## 2016-04-24 NOTE — Progress Notes (Signed)
Patient rested quietly during the night.  States that right arm continues to be numb and pain free.  Fingers pink and warm to touch.  Sling in place for comfort and support as well as pillows for positioning.  Patient continues to refuse blood draw.  She states that she doesn't want to be "bothered" with that right now.  Patient also states that she is not planning on doing therapy today regardless of the schedule that has been planned for her.  States that she has "been through enough" and is planning on just " taking it easy for a few days".  Vital signs remain stable.    Kelli HopeBarber, Bayan Hedstrom M

## 2016-04-24 NOTE — Progress Notes (Addendum)
Occupational Therapy Note  Patient Details  Name: Jillian Morris MRN: [id number] Date of Birth: 03/19/1946  Today's Date: 04/24/2016 OT Individual Time: 1400-1445 OT Individual Time Calculation (min): 45 min    No c/o pain  1:1 Focus on cognitive rehab with focus Min A verbal cues for problem solving and selective attention to an abstract reasoning task for 30 minutes. Patient recalled events from previous day and morning events with Mod I.Left in recliner to rest.    Roney MansSmith, Dorthey Depace [facility] 04/24/2016, 4:23 PM

## 2016-04-24 NOTE — Progress Notes (Signed)
[facility] PHYSICAL MEDICINE & REHABILITATION     PROGRESS NOTE    Subjective/Complaints: Pain/anxiety up and down. Didn't sleep very well. Ate breakfast  ROS: Pt denies fever, rash/itching, headache, blurred or double vision, nausea, vomiting, abdominal pain, diarrhea, chest pain, shortness of breath, palpitations, dysuria, dizziness, neck or back pain, bleeding, anxiety, or depression   Objective: Vital Signs: Blood pressure (!) 162/66, pulse 75, temperature 98.6 F (37 C), temperature source Oral, resp. rate 16, height 5\' 4"  (1.626 m), weight 85.7 kg (189 lb), SpO2 95 %. No results found. No results for input(s): WBC, HGB, HCT, PLT in the last 72 hours. No results for input(s): NA, K, CL, GLUCOSE, BUN, CREATININE, CALCIUM in the last 72 hours.  Invalid input(s): CO CBG (last 3)  No results for input(s): GLUCAP in the last 72 hours.  Wt Readings from Last 3 Encounters:  04/20/16 85.7 kg (189 lb)  04/16/16 85.7 kg (188 lb 15 oz)  03/09/16 82.6 kg (182 lb)    Physical Exam:  Gen: NAD. Well-developed. Vital signs reviewed. HENT: no diplopia.  Multiple bruising to the face/neck improving--resolving.   Eye guard over right maxilla Neck: Normal range of motion. Neck supple. No thyromegalypresent.  Cardiovascular: Normal rateand regular rhythm.  Respiratory: Effort normaland breath sounds normal. No respiratory distress.  GI: Soft. Bowel sounds are normal. She exhibits no distension.  Musculoskeletal:    B/l LE edema Neurological: She is alert. Tracks to all fields Oriented to person place and time. Follows commands. Improving insight  Sensation intact to light touch. Motor: LUE: 4+/5 proximal to distal. RUE Limited by splint B/l LE: Hip flexion 4-/5, knee extension 4/5, ankle dorsi/plantar flexion 4+/5 Skin: Skin is warmand dry  Assessment/Plan: 1. 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 Physiatrist is providing close team supervision and 24 hour management of active medical problems listed below. Physiatrist and rehab team continue to assess barriers to discharge/monitor patient progress toward functional and medical goals.  Function:  Bathing Bathing position   Position: Wheelchair/chair at sink  Bathing parts Body parts bathed by patient: Chest, Abdomen, Right arm, Front perineal area, Buttocks, Right upper leg, Left upper leg, Right lower leg, Left lower leg Body parts bathed by helper: Back, Left arm  Bathing assist Assist Level: Touching or steadying assistance(Pt > 75%)      Upper Body Dressing/Undressing Upper body dressing   What is the patient wearing?: Pull over shirt/dress Bra - Perfomed by patient: Thread/unthread right bra strap, Thread/unthread left bra strap, Hook/unhook bra (pull down sports bra)   Pull over shirt/dress - Perfomed by patient: Thread/unthread right sleeve, Thread/unthread left sleeve, Put head through opening, Pull shirt over trunk          Upper body assist Assist Level: Supervision or verbal cues      Lower Body Dressing/Undressing Lower body dressing   What is the patient wearing?: Underwear, Pants, Non-skid slipper socks Underwear - Performed by patient: Thread/unthread right underwear leg, Thread/unthread left underwear leg, Pull underwear up/down   Pants- Performed by patient: Thread/unthread right pants leg, Thread/unthread left pants leg, Pull pants up/down Pants- Performed by helper: Fasten/unfasten pants Non-skid slipper socks- Performed by patient: Don/doff right sock, Don/doff left sock                    Lower body assist Assist for lower body dressing: Supervision or verbal cues  Toileting Toileting   Toileting steps completed by patient: Adjust clothing prior to toileting, Performs perineal hygiene, Adjust clothing after toileting   Toileting  Assistive Devices: Grab bar or rail  Toileting assist Assist level: Touching or steadying assistance (Pt.75%)   Transfers Chair/bed transfer   Chair/bed transfer method: Ambulatory Chair/bed transfer assist level: Supervision or verbal cues Chair/bed transfer assistive device: Hospital doctorCane     Locomotion Ambulation     Max distance: 150 ft Assist level: Supervision or verbal cues   Wheelchair Wheelchair activity did not occur: Safety/medical concerns        Cognition Comprehension Comprehension assist level: Follows basic conversation/direction with extra time/assistive device  Expression Expression assist level: Expresses basic needs/ideas: With no assist  Social Interaction Social Interaction assist level: Interacts appropriately 50 - 74% of the time - May be physically or verbally inappropriate.  Problem Solving Problem solving assist level: Solves basic 90% of the time/requires cueing < 10% of the time  Memory Memory assist level: Recognizes or recalls 75 - 89% of the time/requires cueing 10 - 24% of the time   Medical Problem List and Plan: 1. TBI/SAH/right distal radius fracture, right maxillary right zygomatic arch fracture  secondary to fall 04/16/2016                       -continue CIR therapies 2.  DVT Prophylaxis/Anticoagulation: SCDs. Monitor for any signs of DVT 3. Pain Management/Fibromyalgia: MS Contin 30 mg every 12 hours, MSIR 30 mg every 6 hours as needed  -observe for neurosedating effects  -pain control seems to be improving 4. Mood: Cymbalta 60 mg twice a day, Ativan 1 mg daily at bedtime--adjust to 9pm, Requip 1 mg daily at bedtime as needed, Xanax 0.25 mg twice daily as needed (reminded her that the xanax was available)  -anxiety/MS can wax and wane  5. Neuropsych: This patient is capable of making decisions on her own behalf. 6. Skin/Wound Care: Routine skin checks 7. Fluids/Electrolytes/Nutrition: continue to encourage PO 8. Right comminuted dorsally impacted  fracture of the distal radius. No plan for surgical intervention at this time. Currently nonweightbearing.  -ORIF 04/23/16 per Dr. Merlyn LotKuzma without issues 9. Right maxillary zygomatic arch fracture. Follow-up per Dr. Suszanne Connerseoh with conservative care   LOS (Days) 4 A FACE TO FACE EVALUATION WAS PERFORMED  Won Kreuzer T 04/24/2016 12:12 PM

## 2016-04-24 NOTE — Progress Notes (Signed)
Occupational Therapy Session Note  Patient Details  Name: Jillian Morris MRN: [id number] Date of Birth: 06/10/1946  Today's Date: 04/24/2016 OT Individual Time: [id number] OT Individual Time Calculation (min): 66 min     Short Term Goals: Week 1:  OT Short Term Goal 1 (Week 1): Pt will recall UB hemi-dressing technique with 1 VC in order to increase independence with ADLs OT Short Term Goal 2 (Week 1): Pt will complete 2 grooming tasks in standing in order to increase functional activity tolerance OT Short Term Goal 3 (Week 1): Pt will dress LB with supervision OT Short Term Goal 4 (Week 1): Pt will direct caregiver for proper donning technique of UE sling mod I  Skilled Therapeutic Interventions/Progress Updates:   Pt participated in skilled OT tx focusing on self sequencing, standing balance, adherence to precautions and safety awareness during self care completion. Pt agreeable to therapy this morning complete ADLs w/c level at sink with overall Min A for L UE. L hand bandage covered to keep dry. Extra time required for pt to self sequence with setup of ADL items and mod vcs for carryover of hemi technique. Sling donned with Mod A and max vcs for sequencing. At end of tx pt was left at sink with safety belt on to complete oral care with provided setup. Nursing staff made aware of pt position. [facility] with Min A. Sit<stands during ADLs with supervision. No c/o pain during session.   Therapy Documentation Precautions:  Precautions Precautions: Fall Required Braces or Orthoses: Sling Restrictions Weight Bearing Restrictions: Yes RUE Weight Bearing: Non weight bearing Other Position/Activity Restrictions: sling for comfort General:   Vital Signs:  Pain: Pain Assessment Pain Assessment: 0-10 Pain Score: 7  Pain Type: Chronic pain Pain Location: Back Pain Orientation: Lower Pain Descriptors / Indicators: Aching Pain Frequency: Constant Pain Onset:  On-going Patients Stated Pain Goal: 3 Pain Intervention(s): Medication (See eMAR) ADL:     See Function Navigator for Current Functional Status.   Therapy/Group: Individual Therapy  Wynell Halberg A Shanika Levings 04/24/2016, 12:20 PM

## 2016-04-24 NOTE — Progress Notes (Signed)
Patient fixated on sling applied to her right arm.  Visualized patient pulling on sling and twisting sling in awkward positions.  Sling properly placed on patient by staff and patient states it isn't correct because she is still able to move her arm.  Explained to patient that sling is used for support and it isn't an arm immobilizer.  Right arm again positioned with pillows for stabilization.  Patient ambulated to bathroom with assistance and voided without difficulty.  Denies arm pain but medicated for complaint of chronic back pain.  Saline lock removed from patients left thumb due to patient complaining of discomfort and "hitting it on everything".  Once again encouraged patient to participate with therapy today.  Patient shook her head.  Kelli HopeBarber, Berton Butrick M

## 2016-04-24 NOTE — Progress Notes (Signed)
Occupational Therapy Note  Patient Details  Name: Jillian Morris MRN: [id number] Date of Birth: 04/09/1946  Today's Date: 04/24/2016 OT Individual Time: 1115-1200 OT Individual Time Calculation (min): 45 min    Pt c/o 7/10 pain in RUE;RN aware and repositioned Individual Therapy  Pt resting in recliner upon arrival and initially declined therapy stating she was in "too much pain." Continued discharge planning, safety awareness, continued education, and activity tolerance to increase independence with BADLs.    Jillian Morris, Destan Franchini [facility] 04/24/2016, 12:07 PM

## 2016-04-24 NOTE — Progress Notes (Signed)
Speech Language Pathology Daily Session Note  Patient Details  Name: Jillian Morris MRN: [id number] Date of Birth: 10/17/1945  Today's Date: 04/24/2016 SLP Individual Time: 1005-1105 SLP Individual Time Calculation (min): 60 min   Short Term Goals: Week 1: SLP Short Term Goal 1 (Week 1): Patient will sustian attention to functional task for 10 minutes with Supervision level verbal cues SLP Short Term Goal 2 (Week 1): Patient will utilize external aids to assist with recall of new and daily information with Min verbal cues SLP Short Term Goal 3 (Week 1): Patient will label 2 cognitive change with Min question cues  SLP Short Term Goal 4 (Week 1): Patient will request help an needed with Min assist verbal cues SLP Short Term Goal 5 (Week 1): Patient will solve mildly complex problems with Min assist verbal and visual cues to recognize and coorect errors  Skilled Therapeutic Interventions: Skilled treatment session focused on cognitive goals. SLP facilitated session by providing Min A verbal cues for problem solving and selective attention to an abstract reasoning task for ~45 minutes. Patient recalled events from previous day and morning events with Mod I. Patient noted to transfer to recliner from wheelchair by herself and was educated on the importance of calling for assistance. She verbalized understanding but will need reinforcement. Patient left upright in recliner with all needs within reach. Continue with current plan of care.   Function:  Cognition Comprehension Comprehension assist level: Understands complex 90% of the time/cues 10% of the time  Expression   Expression assist level: Expresses complex 90% of the time/cues < 10% of the time  Social Interaction Social Interaction assist level: Interacts appropriately with others with medication or extra time (anti-anxiety, antidepressant).  Problem Solving Problem solving assist level: Solves complex 90% of the time/cues < 10% of the  time  Memory Memory assist level: Recognizes or recalls 75 - 89% of the time/requires cueing 10 - 24% of the time    Pain No/Denies Pain   Therapy/Group: Individual Therapy  Darrold Bezek 04/24/2016, 3:40 PM

## 2016-04-24 NOTE — Progress Notes (Signed)
Physical Therapy Session Note  Patient Details  Name: Jillian Morris MRN: [id number] Date of Birth: 06/08/1946  Today's Date: 04/24/2016 PT Individual Time: 1300-1400 PT Individual Time Calculation (min): 60 min    Short Term Goals: Week 1:  PT Short Term Goal 1 (Week 1): STG = LTG due to short ELOS.  Skilled Therapeutic Interventions/Progress Updates:    Session focused on addressing functional mobility including gait, transfers, stairs, and balance, overall d/c planning and education on goals and progress with patient and husband, and cognition during functional activities throughout session. Pt requires close supervision to steadying assist for balance using SPC for mobility. Instructed in balance and strengthening exercises including heel raises, toe raises, mini squats and LAQ (cues for upright posture and abdominal contraction) x 10 reps each. Stair negotiation training with L rail with focus on step-to pattern for safety (steady assist descending needed for control) and progressed to curb step negotiation with SPC to simulate home entry with min assist overall (more assist for descending). End of session returned back to room with overall supervision for gait with [facility] and handoff to NT for vitals.   Therapy Documentation Precautions:  Precautions Precautions: Fall Required Braces or Orthoses: Sling Restrictions Weight Bearing Restrictions: Yes RUE Weight Bearing: Non weight bearing Other Position/Activity Restrictions: sling for comfort  Pain: Pain Assessment Pain Assessment: 0-10 Pain Score: 4  Pain Type: Chronic pain Pain Location: Back Pain Orientation: Lower Pain Descriptors / Indicators: Aching Pain Frequency: Constant Pain Onset: On-going Patients Stated Pain Goal: 3 Pain Intervention(s): Medication (See eMAR)  See Function Navigator for Current Functional Status.   Therapy/Group: Individual Therapy  Karolee StampsGray, Huriel Matt Darrol PokeBrescia  Torsha Lemus B. Roch Quach, PT, DPT  04/24/2016,  2:15 PM

## 2016-04-25 ENCOUNTER — Inpatient Hospital Stay (HOSPITAL_COMMUNITY): Payer: Medicare Other | Admitting: Speech Pathology

## 2016-04-25 ENCOUNTER — Inpatient Hospital Stay (HOSPITAL_COMMUNITY): Payer: Medicare Other | Admitting: *Deleted

## 2016-04-25 ENCOUNTER — Inpatient Hospital Stay (HOSPITAL_COMMUNITY): Payer: Medicare Other | Admitting: Occupational Therapy

## 2016-04-25 NOTE — Progress Notes (Signed)
Speech Language Pathology Daily Session Note  Patient Details  Name: Jillian Morris MRN: [id number] Date of Birth: 04/18/1946  Today's Date: 04/25/2016 SLP Individual Time: 0930-1015 SLP Individual Time Calculation (min): 45 min   Short Term Goals: Week 1: SLP Short Term Goal 1 (Week 1): Patient will sustian attention to functional task for 10 minutes with Supervision level verbal cues SLP Short Term Goal 2 (Week 1): Patient will utilize external aids to assist with recall of new and daily information with Min verbal cues SLP Short Term Goal 3 (Week 1): Patient will label 2 cognitive change with Min question cues  SLP Short Term Goal 4 (Week 1): Patient will request help an needed with Min assist verbal cues SLP Short Term Goal 5 (Week 1): Patient will solve mildly complex problems with Min assist verbal and visual cues to recognize and coorect errors  Skilled Therapeutic Interventions:   Skilled treatment session focused on addressing cognition goals. SLP facilitated session by providing Min assist questions cues for patient to instruct SLP and provide enough details to accurately complete and novel problem solving puzzle.  Patient required extra time and Supervision question cues to recognize and correct errors while she completed the same task.  Patient demonstrated selective attention to task for 30 minutes.  Patient required Supervision question cues to identify cognitive deficits and recognize when help was needed.  Continue with current plan of care.    Function:  Cognition Comprehension Comprehension assist level: Understands complex 90% of the time/cues 10% of the time  Expression   Expression assist level: Expresses complex 90% of the time/cues < 10% of the time  Social Interaction Social Interaction assist level: Interacts appropriately with others with medication or extra time (anti-anxiety, antidepressant).  Problem Solving Problem solving assist level: Solves complex 90% of  the time/cues < 10% of the time  Memory Memory assist level: Recognizes or recalls 75 - 89% of the time/requires cueing 10 - 24% of the time    Pain Pain Assessment Pain Assessment: No/denies pain  Therapy/Group: Individual Therapy  Charlane FerrettiMelissa Evangelene Vora, M.A., CCC-SLP [phone]  Samah Lapiana 04/25/2016, 12:40 PM

## 2016-04-25 NOTE — Consult Note (Signed)
Neurocognitive Evaluation - Confidential [facility]  Ms. Jillian Morris is a 70 year old woman, who was seen for a brief neurocognitive evaluation to assess her cognitive and emotional functioning in the setting of right frontal subarachnoid hemorrhage s/p fall.  Ms. Jillian Morris husband was present during the current evaluation.  During the clinical interview, Ms. Jillian Morris reported that she lost consciousness after falling and does not remember the drive to the emergency room or any of her time there.  Her first memory was talking to her husband when she was headed to intensive care.  However, she was not unconscious for all of that time, as her husband recalled speaking with her when she was in the emergency room.  Therefore, most of that memory loss could have been postconcussive amnesia.    Cognitive Functioning:  Ms. Jillian Morris total score on a brief measure of overall mental status was within the range to suggest possible mild cognitive disruption (MoCA = 25/30).  She lost points for making errors when asked to copy a simple geometric figure, complete a set-shifting task, complete a task of serial subtraction, rapidly generate words according to a letter prompt, or accurately repeat a sentence.  However, her memory was fully intact for 5 studied words and she did not demonstrate inattention, naming difficulty, trouble with verbal abstract reasoning, or disorientation.  Subjectively, she noted that 2 days ago, she felt highly confused, had trouble comprehending spoken language and expressing her thoughts.  However, her symptoms spontaneously improved and have reportedly not occurred on other days.  She also noted that she felt disoriented to time on most days.  She denied other cognitive concerns.    Emotional Functioning:  During the clinical interview, Ms. Jillian Morris described her mood as generally "good."  She noted that despite sleeping well, she has been highly fatigued due to  exhaustion from physical therapy and such.  Her appetite has reportedly remained good.  She denied feeling depressed since on this unit, but described a longstanding history of depression as part of Bipolar I disorder, which is currently well-controlled with medication.  She commented that she has only had 1 manic episode, but it was many years ago.  Her depression, she said, had begun worsening for several months prior to the current hospitalization, but she has not felt it since being here.  She acknowledged suicidal ideation in the past, but denied any recent ideation or history of suicide attempts.  Her husband noted that she is often anxious and Ms. Jillian Morris confirmed this.  She stated that she worries about her pain and wants to go home.  She described a history of panic attacks, but noted that she has not had any panic symptoms since being in the hospital.  Regarding her worry, she commented that it has been hard to know that she was so confused a couple of days ago, but to have no personal memory of it as this makes her feel out of control.  She also described worry today about fasting before her surgery.  In addition to mood symptoms, Ms. Jillian Morris commented that she has misperceived things in her peripheral vision over the past couple of days; on one occasion, she saw shadows outside of the window that looked like a person and on another occasion she saw and heart boxes on a trolley going down the hall beside her room.  Despite her symptoms, Ms. Jillian Morris stated that she has been able to maintain a positive attitude and she and her husband commented that  she has always been that way.  She credited her faith as the driving force behind her positivity.  Impressions and Recommendations Ms. Jillian Morris's total score on an overall measure of mental status was suggestive of mild cognitive disruption, particularly in executive functioning.  In comparison with her prior MoCA (blind version administered on 8/27), the  portion that could be compared reflected general stability over time.  However, new pieces were administered today (e.g. involving drawing and naming), of which, she was able to successfully complete most despite using her non-dominant hand.  Executive functioning difficulties are common in the setting of TBI, particularly one that involves the left frontal lobe.  Some, if not total recovery, is expected within the next 3-6 months.  Ms. Jillian Morris was provided results of testing and with psychoeducation regarding the expected course of cognitive recovery in the setting of TBI and she appeared to understand.  She was encouraged to seek a comprehensive neuropsychological evaluation as an outpatient in 3-6 months if her cognitive symptoms do not improve.  Contact information for a provider in her area should be included in her discharge paperwork for that purpose.  In the meantime, she was encouraged to make lists, set an oven timer to remind her to turn off the stove, and to avoid multi-tasking.  From an emotional perspective, Ms. Jillian Morris's bipolar symptoms seem to be well-controlled at this time and no additional intervention is likely indicated.  Although she endorsed some anxiety (worry), her symptoms do not appear to be adversely impacting her functioning or participation in therapy at this time and therefore, no treatment adjustments are likely indicated.  Additional follow-up with the neuropsychologist could be requested should her treatment team feel that it would be beneficial in informing care.    Jillian CellaKaren Jamya Starry, PsyD Clinical Neuropsychologist

## 2016-04-25 NOTE — Progress Notes (Signed)
PHYSICAL MEDICINE & REHABILITATION     PROGRESS NOTE    Subjective/Complaints: Had a much better night. Able to sleep. In good spirits this morning  ROS: Pt denies fever, rash/itching, headache, blurred or double vision, nausea, vomiting, abdominal pain, diarrhea, chest pain, shortness of breath, palpitations, dysuria, dizziness, neck or back pain, bleeding, anxiety, or depression   Objective: Vital Signs: Blood pressure 125/62, pulse 84, temperature 98.7 F (37.1 C), temperature source Oral, resp. rate 17, height 5\' 4"  (1.626 m), weight 85.7 kg (189 lb), SpO2 95 %. No results found. No results for input(s): WBC, HGB, HCT, PLT in the last 72 hours. No results for input(s): NA, K, CL, GLUCOSE, BUN, CREATININE, CALCIUM in the last 72 hours.  Invalid input(s): CO CBG (last 3)  No results for input(s): GLUCAP in the last 72 hours.  Wt Readings from Last 3 Encounters:  04/20/16 85.7 kg (189 lb)  04/16/16 85.7 kg (188 lb 15 oz)  03/09/16 82.6 kg (182 lb)    Physical Exam:  Gen: NAD. Well-developed. Vital signs reviewed. HENT: no diplopia.  Bruising resolving on neck   Eye guard over right maxilla Neck: Normal range of motion. Neck supple. No thyromegalypresent.  Cardiovascular: Normal rateand regular rhythm.  Respiratory: Effort normaland breath sounds normal. No respiratory distress.  GI: Soft. Bowel sounds are normal. She exhibits no distension.  Musculoskeletal:    B/l LE edema Neurological: She is alert. Tracks to all fields Oriented to person place and time. Follows commands. Improving insight  Sensation intact to light touch. Motor: LUE: 4+/5 proximal to distal. RUE Limited by splint B/l LE: Hip flexion 4-/5, knee extension 4/5, ankle dorsi/plantar flexion 4+/5 Skin: Skin is warmand dry  Assessment/Plan: 1. Functional/mobility deficits secondary to TBI and polytrauma which require 3+ hours per day of interdisciplinary therapy in a comprehensive  inpatient rehab setting. Physiatrist is providing close team supervision and 24 hour management of active medical problems listed below. Physiatrist and rehab team continue to assess barriers to discharge/monitor patient progress toward functional and medical goals.  Function:  Bathing Bathing position   Position: Wheelchair/chair at sink  Bathing parts Body parts bathed by patient: Right arm, Chest, Abdomen, Front perineal area, Buttocks, Right upper leg, Left upper leg, Right lower leg, Left lower leg Body parts bathed by helper: Back, Left arm  Bathing assist Assist Level: Touching or steadying assistance(Pt > 75%)      Upper Body Dressing/Undressing Upper body dressing   What is the patient wearing?: Pull over shirt/dress Bra - Perfomed by patient: Thread/unthread right bra strap, Thread/unthread left bra strap, Hook/unhook bra (pull down sports bra)   Pull over shirt/dress - Perfomed by patient: Thread/unthread right sleeve, Thread/unthread left sleeve, Put head through opening, Pull shirt over trunk          Upper body assist Assist Level: Supervision or verbal cues      Lower Body Dressing/Undressing Lower body dressing   What is the patient wearing?: Underwear, Socks, Shoes, Pants Underwear - Performed by patient: Thread/unthread right underwear leg, Thread/unthread left underwear leg, Pull underwear up/down   Pants- Performed by patient: Thread/unthread right pants leg, Thread/unthread left pants leg, Pull pants up/down Pants- Performed by helper: Fasten/unfasten pants Non-skid slipper socks- Performed by patient: Don/doff right sock, Don/doff left sock     Socks - Performed by helper: Don/doff right sock, Don/doff left sock (due to time constraints only)   Shoes - Performed by helper: Don/doff right shoe, Don/doff left shoe, Fasten  right, Fasten left (Due to time constraints only)          Lower body assist Assist for lower body dressing: Supervision or verbal  cues      Toileting Toileting   Toileting steps completed by patient: Adjust clothing prior to toileting, Performs perineal hygiene, Adjust clothing after toileting   Toileting Assistive Devices: Grab bar or rail  Toileting assist Assist level: Touching or steadying assistance (Pt.75%)   Transfers Chair/bed transfer   Chair/bed transfer method: Ambulatory Chair/bed transfer assist level: Supervision or verbal cues Chair/bed transfer assistive device: Cane, Orthosis     Locomotion Ambulation     Max distance: 150 ft Assist level: Supervision or verbal cues   Wheelchair Wheelchair activity did not occur: Safety/medical concerns        Cognition Comprehension Comprehension assist level: Understands complex 90% of the time/cues 10% of the time  Expression Expression assist level: Expresses complex 90% of the time/cues < 10% of the time  Social Interaction Social Interaction assist level: Interacts appropriately with others with medication or extra time (anti-anxiety, antidepressant).  Problem Solving Problem solving assist level: Solves complex 90% of the time/cues < 10% of the time  Memory Memory assist level: Recognizes or recalls 75 - 89% of the time/requires cueing 10 - 24% of the time   Medical Problem List and Plan: 1. TBI/SAH/right distal radius fracture, right maxillary right zygomatic arch fracture  secondary to fall 04/16/2016                       -continue CIR therapies 2.  DVT Prophylaxis/Anticoagulation: SCDs. Monitor for any signs of DVT 3. Pain Management/Fibromyalgia: MS Contin 30 mg every 12 hours, MSIR 30 mg every 6 hours as needed  -pain control seems to be improving 4. Mood: Cymbalta 60 mg twice a day, Ativan 1 mg daily at bedtime--adjust to 9pm, Requip 1 mg daily at bedtime as needed, Xanax 0.25 mg twice daily as needed   -anxiety/MS can wax and wane   -needs continued reassurance 5. Neuropsych: This patient is capable of making decisions on her own  behalf. 6. Skin/Wound Care: Routine skin checks 7. Fluids/Electrolytes/Nutrition: continue to encourage PO 8. Right comminuted dorsally impacted fracture of the distal radius. No plan for surgical intervention at this time. Currently nonweightbearing.  -ORIF 04/23/16 per Dr. Merlyn LotKuzma   -RUE NVI 9. Right maxillary zygomatic arch fracture. Follow-up per Dr. Suszanne Connerseoh with conservative care   LOS (Days) 5 A FACE TO FACE EVALUATION WAS PERFORMED  Jillian Morris 04/25/2016 8:50 AM

## 2016-04-25 NOTE — Progress Notes (Signed)
Occupational Therapy Session Note  Patient Details  Name: Jillian Morris MRN: [id number] Date of Birth: 05/09/1946  Today's Date: 04/25/2016 OT Missed Time: 60 Minutes and 30 min Missed Time Reason: Patient ill (comment)    Short Term Goals: Week 1:  OT Short Term Goal 1 (Week 1): Pt will recall UB hemi-dressing technique with 1 VC in order to increase independence with ADLs OT Short Term Goal 2 (Week 1): Pt will complete 2 grooming tasks in standing in order to increase functional activity tolerance OT Short Term Goal 3 (Week 1): Pt will dress LB with supervision OT Short Term Goal 4 (Week 1): Pt will direct caregiver for proper donning technique of UE sling mod I      Skilled Therapeutic Interventions/Progress Updates:    Visit 1: Pt declined to participate as she was not feeling well and very concerned about recent visual hallucinations. She felt that she needed to rest.  Discussed with pt that I will check back with her this afternoon to see if she can participate then.    Visit 2: checked back with pt at 1515, but she continued to decline therapy stating she just wanted to rest today and could not tolerate getting out of bed.   Therapy Documentation Precautions:  Precautions Precautions: Fall Required Braces or Orthoses: Sling Restrictions Weight Bearing Restrictions: Yes RUE Weight Bearing: Non weight bearing Other Position/Activity Restrictions: sling for comfort General: General OT Amount of Missed Time: 60 Minutes    Pain: Pain Assessment Pain Assessment: 0-10 Pain Score: 6  Pain Type: Chronic pain Pain Location: Back Pain Orientation: Lower;Mid Pain Descriptors / Indicators: Aching Pain Onset: On-going Patients Stated Pain Goal: 2 Pain Intervention(s): Medication (See eMAR) Multiple Pain Sites: No ADL:   See Function Navigator for Current Functional Status.   Therapy/Group: Individual Therapy  Aayushi Solorzano 04/25/2016, 11:47 AM

## 2016-04-26 ENCOUNTER — Inpatient Hospital Stay (HOSPITAL_COMMUNITY): Payer: Medicare Other | Admitting: Occupational Therapy

## 2016-04-26 NOTE — Progress Notes (Signed)
Occupational Therapy Session Note  Patient Details  Name: Jillian Morris MRN: [id number] Date of Birth: June 06, 1946  Today's Date: 04/26/2016 OT Individual Time:  -   1445-1538  (5 mins)       Short Term Goals: Week 1:  OT Short Term Goal 1 (Week 1): Pt will recall UB hemi-dressing technique with 1 VC in order to increase independence with ADLs OT Short Term Goal 2 (Week 1): Pt will complete 2 grooming tasks in standing in order to increase functional activity tolerance OT Short Term Goal 3 (Week 1): Pt will dress LB with supervision OT Short Term Goal 4 (Week 1): Pt will direct caregiver for proper donning technique of UE sling mod I     Skilled Therapeutic Interventions/Progress Updates:    Pt sitting EOB.  RN taking BP.  (140/72);  After 5 minutes 127/67.  Pt ambulated to gym and engaged in cognitive rehab.  PPt recalled 3/5 works after immediately and 5/5 workds after 5 minutes, 30 minutes respectively.   PPt needed min verbal cues for problem solving and attention for 30 minutes.  She ambulated to gym.  Engaged in balance and cognitive rehab.  She verbalized how seh and her husband SParky met with great detail while engaging in horseshoes.  Therapy Documentation Precautions:  Precautions Precautions: Fall Required Braces or Orthoses: Sling Restrictions Weight Bearing Restrictions: Yes RUE Weight Bearing: Non weight bearing Other Position/Activity Restrictions: sling for comfort General:   Vital Signs: Therapy Vitals Temp: 98.4 F (36.9 C) Temp Source: Oral Pulse Rate: 79 Resp: 18 BP: (!) 142/63 Patient Position (if appropriate): Sitting Oxygen Therapy SpO2: 96 % O2 Device: Not Delivered Pain: Pain Assessment Pain Assessment: 0-10 Pain Score: 4  Pain Location: Arm Pain Descriptors / Indicators: Aching Pain Onset: On-going Patients Stated Pain Goal: 3 Pain Intervention(s): Medication (See eMAR)       See Function Navigator for Current Functional  Status.   Therapy/Group: Individual Therapy  Lisa Roca 04/26/2016, 4:01 PM

## 2016-04-26 NOTE — Significant Event (Signed)
Called to room ~1445; pt noted she felt dizzy when she got up to the bathroom after she had been sitting on the side of the bed. Once seated on the toilet the "spinning" stopped and she was able to void and returned to the bed.~1445 BP 140/72, P 76, Resp 20 and o2 97% when seated on side of bed. Checked vitals again after pt stood;  ~1450:Noted BP 127/67, P 72 resp 20 and o2 97%. PT reported she was a little dizzy upon standing. TED hose applied and pt able to continue with OT session. PT and spouse asking if the patient will have a follow up CT scan? As this is what brought her into the hospital. Pt is anxious about "possibility of having a stroke" and asking what symptoms she should be looking for. Reviewed medications she is taking and stroke prevention actions: medications, monitor cholesterol, blood pressure, etc. Tolerating OT session and no other reports of dizziness. Pamelia HoitSharp, Ndea Kilroy B

## 2016-04-26 NOTE — Progress Notes (Signed)
[facility] MEDICINE & REHABILITATION     PROGRESS NOTE    Subjective/Complaints: Overall feeling better. Occasionally displays confusion, but episodes less frequent  ROS: Pt denies fever, rash/itching, headache, blurred or double vision, nausea, vomiting, abdominal pain, diarrhea, chest pain, shortness of breath, palpitations, dysuria, dizziness, neck or back pain, bleeding, anxiety, or depression   Objective: Vital Signs: Blood pressure 119/61, pulse 70, temperature 98.3 F (36.8 C), temperature source Oral, resp. rate 18, height 5\' 4"  (1.626 m), weight 85.7 kg (189 lb), SpO2 97 %. No results found. No results for input(s): WBC, HGB, HCT, PLT in the last 72 hours. No results for input(s): NA, K, CL, GLUCOSE, BUN, CREATININE, CALCIUM in the last 72 hours.  Invalid input(s): CO CBG (last 3)  No results for input(s): GLUCAP in the last 72 hours.  Wt Readings from Last 3 Encounters:  04/20/16 85.7 kg (189 lb)  04/16/16 85.7 kg (188 lb 15 oz)  03/09/16 82.6 kg (182 lb)    Physical Exam:  Gen: NAD. Well-developed. Vital signs reviewed. HENT: no diplopia.  Bruising resolving on neck   Eye guard over right maxilla Neck: Normal range of motion. Neck supple. No thyromegalypresent.  Cardiovascular: Normal rateand regular rhythm.  Respiratory: Effort normaland breath sounds normal. No respiratory distress.  GI: Soft. Bowel sounds are normal. She exhibits no distension.  Musculoskeletal:    B/l LE edema Neurological: She is alert. Tracks to all fields Oriented to person place and time. Follows commands. Improving insight  Sensation intact to light touch. Motor: LUE: 4+/5 proximal to distal. RUE Limited by splint B/l LE: Hip flexion 4-/5, knee extension 4/5, ankle dorsi/plantar flexion 4+/5 Skin: Skin is warmand dry  Assessment/Plan: 1. 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 Physiatrist is providing close team supervision and 24 hour management of active medical problems listed below. Physiatrist and rehab team continue to assess barriers to discharge/monitor patient progress toward functional and medical goals.  Function:  Bathing Bathing position   Position: Wheelchair/chair at sink  Bathing parts Body parts bathed by patient: Right arm, Chest, Abdomen, Front perineal area, Buttocks, Right upper leg, Left upper leg, Right lower leg, Left lower leg Body parts bathed by helper: Back, Left arm  Bathing assist Assist Level: Touching or steadying assistance(Pt > 75%)      Upper Body Dressing/Undressing Upper body dressing   What is the patient wearing?: Pull over shirt/dress Bra - Perfomed by patient: Thread/unthread right bra strap, Thread/unthread left bra strap, Hook/unhook bra (pull down sports bra)   Pull over shirt/dress - Perfomed by patient: Thread/unthread right sleeve, Thread/unthread left sleeve, Put head through opening, Pull shirt over trunk          Upper body assist Assist Level: Supervision or verbal cues      Lower Body Dressing/Undressing Lower body dressing   What is the patient wearing?: Underwear, Socks, Shoes, Pants Underwear - Performed by patient: Thread/unthread right underwear leg, Thread/unthread left underwear leg, Pull underwear up/down   Pants- Performed by patient: Thread/unthread right pants leg, Thread/unthread left pants leg, Pull pants up/down Pants- Performed by helper: Fasten/unfasten pants Non-skid slipper socks- Performed by patient: Don/doff right sock, Don/doff left sock     Socks - Performed by helper: Don/doff right sock, Don/doff left sock (due to time constraints only)   Shoes - Performed by helper: Don/doff right shoe, Don/doff left shoe, Fasten right, Fasten left (  Due to time constraints only)          Lower body assist Assist for lower body dressing:  Supervision or verbal cues      Toileting Toileting   Toileting steps completed by patient: Adjust clothing prior to toileting, Performs perineal hygiene, Adjust clothing after toileting   Toileting Assistive Devices: Grab bar or rail  Toileting assist Assist level: Touching or steadying assistance (Pt.75%)   Transfers Chair/bed transfer   Chair/bed transfer method: Ambulatory Chair/bed transfer assist level: Supervision or verbal cues Chair/bed transfer assistive device: Cane, Orthosis     Locomotion Ambulation     Max distance: 150 ft Assist level: Supervision or verbal cues   Wheelchair Wheelchair activity did not occur: Safety/medical concerns        Cognition Comprehension Comprehension assist level: Understands complex 90% of the time/cues 10% of the time  Expression Expression assist level: Expresses complex 90% of the time/cues < 10% of the time  Social Interaction Social Interaction assist level: Interacts appropriately with others with medication or extra time (anti-anxiety, antidepressant).  Problem Solving Problem solving assist level: Solves complex 90% of the time/cues < 10% of the time  Memory Memory assist level: Recognizes or recalls 75 - 89% of the time/requires cueing 10 - 24% of the time   Medical Problem List and Plan: 1. TBI/SAH/right distal radius fracture, right maxillary right zygomatic arch fracture  secondary to fall 04/16/2016                       -continue CIR therapies 2.  DVT Prophylaxis/Anticoagulation: SCDs. Monitor for any signs of DVT 3. Pain Management/Fibromyalgia: MS Contin 30 mg every 12 hours, MSIR 30 mg every 6 hours as needed  -pain control seems to be improving 4. Mood: Cymbalta 60 mg twice a day, Ativan 1 mg daily at bedtime--adjust to 9pm, Requip 1 mg daily at bedtime as needed, Xanax 0.25 mg twice daily as needed   -anxiety/MS can wax and wane   -needs continued reassurance  -still has "disassociative moments" 5. Neuropsych:  This patient is capable of making decisions on her own behalf. 6. Skin/Wound Care: Routine skin checks  -removed cover from right temple. Dc sutures 7. Fluids/Electrolytes/Nutrition: continue to encourage PO 8. Right comminuted dorsally impacted fracture of the distal radius. No plan for surgical intervention at this time. Currently nonweightbearing.  -ORIF 04/23/16 per Dr. Merlyn LotKuzma   -RUE NVI 9. Right maxillary zygomatic arch fracture. Follow-up per Dr. Suszanne Connerseoh with conservative care   LOS (Days) 6 A FACE TO FACE EVALUATION WAS PERFORMED  Airianna Kreischer T 04/26/2016 8:37 AM

## 2016-04-27 ENCOUNTER — Inpatient Hospital Stay (HOSPITAL_COMMUNITY): Payer: Medicare Other | Admitting: Occupational Therapy

## 2016-04-27 ENCOUNTER — Inpatient Hospital Stay (HOSPITAL_COMMUNITY): Payer: Medicare Other | Admitting: Speech Pathology

## 2016-04-27 ENCOUNTER — Inpatient Hospital Stay (HOSPITAL_COMMUNITY): Payer: Medicare Other | Admitting: Physical Therapy

## 2016-04-27 NOTE — Progress Notes (Signed)
[facility] PHYSICAL MEDICINE & REHABILITATION     PROGRESS NOTE    Subjective/Complaints: Pain medications were late today. Some swelling in the right hand. Otherwise doing ok  ROS: Pt denies fever, rash/itching, headache, blurred or double vision, nausea, vomiting, abdominal pain, diarrhea, chest pain, shortness of breath, palpitations, dysuria, dizziness, neck or back pain, bleeding, anxiety, or depression   Objective: Vital Signs: Blood pressure 138/67, pulse 77, temperature 98.3 F (36.8 C), temperature source Oral, resp. rate 16, height 5\' 4"  (1.626 m), weight 85.7 kg (189 lb), SpO2 97 %. No results found. No results for input(s): WBC, HGB, HCT, PLT in the last 72 hours. No results for input(s): NA, K, CL, GLUCOSE, BUN, CREATININE, CALCIUM in the last 72 hours.  Invalid input(s): CO CBG (last 3)  No results for input(s): GLUCAP in the last 72 hours.  Wt Readings from Last 3 Encounters:  04/20/16 85.7 kg (189 lb)  04/16/16 85.7 kg (188 lb 15 oz)  03/09/16 82.6 kg (182 lb)    Physical Exam:  Gen: NAD. Well-developed. Vital signs reviewed. HENT: no diplopia.  Bruising resolving on neck   Eye guard over right maxilla Neck: Normal range of motion. Neck supple. No thyromegalypresent.  Cardiovascular: Normal rateand regular rhythm.  Respiratory: Effort normaland breath sounds normal. No respiratory distress.  GI: Soft. Bowel sounds are normal. She exhibits no distension.  Musculoskeletal:    B/l LE edema Neurological: She is alert. Tracks to all fields Oriented to person place and time. Follows commands. Improving insight  Sensation intact to light touch. Motor: LUE: 4+/5 proximal to distal. RUE Limited by splint B/l LE: Hip flexion 4-/5, knee extension 4/5, ankle dorsi/plantar flexion 4+/5 Skin: Skin is warmand dry  Assessment/Plan: 1. 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 Physiatrist is providing close team supervision and 24 hour management of active medical problems listed below. Physiatrist and rehab team continue to assess barriers to discharge/monitor patient progress toward functional and medical goals.  Function:  Bathing Bathing position   Position: Wheelchair/chair at sink  Bathing parts Body parts bathed by patient: Right arm, Chest, Abdomen, Front perineal area, Buttocks, Right upper leg, Left upper leg, Right lower leg, Left lower leg Body parts bathed by helper: Back, Left arm  Bathing assist Assist Level: Touching or steadying assistance(Pt > 75%)      Upper Body Dressing/Undressing Upper body dressing   What is the patient wearing?: Pull over shirt/dress Bra - Perfomed by patient: Thread/unthread right bra strap, Thread/unthread left bra strap, Hook/unhook bra (pull down sports bra)   Pull over shirt/dress - Perfomed by patient: Thread/unthread right sleeve, Thread/unthread left sleeve, Put head through opening, Pull shirt over trunk          Upper body assist Assist Level: Supervision or verbal cues      Lower Body Dressing/Undressing Lower body dressing   What is the patient wearing?: Underwear, Socks, Shoes, Pants Underwear - Performed by patient: Thread/unthread right underwear leg, Thread/unthread left underwear leg, Pull underwear up/down   Pants- Performed by patient: Thread/unthread right pants leg, Thread/unthread left pants leg, Pull pants up/down Pants- Performed by helper: Fasten/unfasten pants Non-skid slipper socks- Performed by patient: Don/doff right sock, Don/doff left sock     Socks - Performed by helper: Don/doff right sock, Don/doff left sock (due to time constraints only)   Shoes - Performed by helper: Don/doff right shoe, Don/doff left shoe,  Fasten right, Fasten left (Due to time constraints only)          Lower body assist Assist for lower body dressing:  Supervision or verbal cues      Toileting Toileting   Toileting steps completed by patient: Adjust clothing prior to toileting, Performs perineal hygiene, Adjust clothing after toileting   Toileting Assistive Devices: Grab bar or rail  Toileting assist Assist level: Touching or steadying assistance (Pt.75%)   Transfers Chair/bed transfer   Chair/bed transfer method: Ambulatory Chair/bed transfer assist level: Supervision or verbal cues Chair/bed transfer assistive device: Cane, Orthosis     Locomotion Ambulation     Max distance: 150 ft Assist level: Supervision or verbal cues   Wheelchair Wheelchair activity did not occur: Safety/medical concerns        Cognition Comprehension Comprehension assist level: Understands complex 90% of the time/cues 10% of the time  Expression Expression assist level: Expresses complex 90% of the time/cues < 10% of the time  Social Interaction Social Interaction assist level: Interacts appropriately with others with medication or extra time (anti-anxiety, antidepressant).  Problem Solving Problem solving assist level: Solves complex problems: Recognizes & self-corrects, Solves complex 90% of the time/cues < 10% of the time  Memory Memory assist level: Recognizes or recalls 90% of the time/requires cueing < 10% of the time   Medical Problem List and Plan: 1. TBI/SAH/right distal radius fracture, right maxillary right zygomatic arch fracture  secondary to fall 04/16/2016                       -continue CIR therapies 2.  DVT Prophylaxis/Anticoagulation: SCDs. Monitor for any signs of DVT 3. Pain Management/Fibromyalgia: MS Contin 30 mg every 12 hours, MSIR 30 mg every 6 hours as needed  -pain control seems to be improving 4. Mood: Cymbalta 60 mg twice a day, Ativan 1 mg daily at bedtime--adjust to 9pm, Requip 1 mg daily at bedtime as needed, Xanax 0.25 mg twice daily as needed   -anxiety/MS can wax and wane   -needs continued reassurance  -still  has "disassociative moments" 5. Neuropsych: This patient is capable of making decisions on her own behalf. 6. Skin/Wound Care: Routine skin checks  -sutures out  -may shower with right arm covered 7. Fluids/Electrolytes/Nutrition: continue to encourage PO 8. Right comminuted dorsally impacted fracture of the distal radius. No plan for surgical intervention at this time. Currently nonweightbearing.  -ORIF 04/23/16 per Dr. Merlyn Lot   -RUE NVI 9. Right maxillary zygomatic arch fracture. Follow-up per Dr. Suszanne Conners with conservative care   LOS (Days) 7 A FACE TO FACE EVALUATION WAS PERFORMED  Jillian Morris T 04/27/2016 9:29 AM

## 2016-04-27 NOTE — Progress Notes (Signed)
Occupational Therapy Session Note  Patient Details  Name: Jillian Morris MRN: [id number] Date of Birth: 04/06/1946  Today's Date: 04/27/2016 OT Individual Time: 1030-1200 OT Individual Time Calculation (min): 90 min     Short Term Goals:Week 1:  OT Short Term Goal 1 (Week 1): Pt will recall UB hemi-dressing technique with 1 VC in order to increase independence with ADLs OT Short Term Goal 2 (Week 1): Pt will complete 2 grooming tasks in standing in order to increase functional activity tolerance OT Short Term Goal 3 (Week 1): Pt will dress LB with supervision OT Short Term Goal 4 (Week 1): Pt will direct caregiver for proper donning technique of UE sling mod I  Skilled Therapeutic Interventions/Progress Updates:    Pt seen for skilled OT to facilitate functional mobility,activity tolerance, and adaptive strategies. Pt completed toileting, shower, dressing, grooming today with only S (except for tying shoes) with more than reasonable time as she tends to move through each step slowly.  Cast covered. Shower permitted per MD.  Pt used SPC and she ambulated around room to retrieve clothing and supplies. A few times, pt stood up when therapist left room to get supplies. Reminded pt to not stand/walk without staff in the room.  At end of session, placed shoe buttons in pt shoes and demonstrated how she can use them to avoid tying her shoes. She will practice that more in the next session.  (Ted hose not applied as she needs XL and waiting on delivery of that size.)  Pt set up in recliner with meal tray and call light. Spouse arrived.  Therapy Documentation Precautions:  Precautions Precautions: Fall Required Braces or Orthoses: Sling Restrictions Weight Bearing Restrictions: Yes RUE Weight Bearing: Non weight bearing Other Position/Activity Restrictions: sling for comfort      Pain: Pain Assessment Pain Assessment: No/denies pain ADL:   See Function Navigator for Current Functional  Status.   Therapy/Group: Individual Therapy  Rhone Ozaki 04/27/2016, 12:37 PM

## 2016-04-27 NOTE — Progress Notes (Signed)
Speech Language Pathology Daily Session Note  Patient Details  Name: Jillian Morris MRN: [id number] Date of Birth: 05/30/1946  Today's Date: 04/27/2016 SLP Individual Time: [id number] SLP Individual Time Calculation (min): 65 min   Short Term Goals: Week 1: SLP Short Term Goal 1 (Week 1): Patient will sustian attention to functional task for 10 minutes with Supervision level verbal cues SLP Short Term Goal 2 (Week 1): Patient will utilize external aids to assist with recall of new and daily information with Min verbal cues SLP Short Term Goal 3 (Week 1): Patient will label 2 cognitive change with Min question cues  SLP Short Term Goal 4 (Week 1): Patient will request help an needed with Min assist verbal cues SLP Short Term Goal 5 (Week 1): Patient will solve mildly complex problems with Min assist verbal and visual cues to recognize and coorect errors  Skilled Therapeutic Interventions:Skilled therapy intervention focused on cognitive goals. Upon arrival patient attempted to get out of bed without assistance; she did not demonstrate intellectual awareness of need for assistance when getting out of bed. In conversation patient expressed understanding of her brain injury, correctly identifying the site (right frontal lobe) and effect of this injury on personality. The patient's conversation was appropriate and on-topic 90% of the time. Patient independently identified 6 of her 7 medications, naming their purpose and dosage. She required Max A verbal cues for problem solving with a  two time a day pillbox activity with decreased mental flexibility. During said activity patient independently demonstrated sustained attention ~ 30 minutes with moderate distractions in the room. Patient left upright in chair with all needs within reach. Continue current plan of care.   Function:  Cognition Comprehension Comprehension assist level: Understands complex 90% of the time/cues 10% of the time  Expression    Expression assist level: Expresses complex ideas: With extra time/assistive device  Social Interaction Social Interaction assist level: Interacts appropriately with others with medication or extra time (anti-anxiety, antidepressant).  Problem Solving Problem solving assist level: Solves basic 50 - 74% of the time/requires cueing 25 - 49% of the time  Memory Memory assist level: Recognizes or recalls 90% of the time/requires cueing < 10% of the time    Pain Pain Assessment Pain Assessment: No/denies pain  Therapy/Group: Individual Therapy  Jillian Morris 04/27/2016, 11:36 AM

## 2016-04-27 NOTE — Progress Notes (Addendum)
Physical Therapy Session Note  Patient Details  Name: Jillian Morris MRN: [id number] Date of Birth: 12/28/1945  Today's Date: 04/27/2016 PT Individual Time: 0902-1000 and [id number] PT Individual Time Calculation (min): 58 min and 61 min   Short Term Goals: Week 1:  PT Short Term Goal 1 (Week 1): STG = LTG due to short ELOS.  Skilled Therapeutic Interventions/Progress Updates:    Treatment 1: Pt received in recliner & agreeable to treatment noting 7/10 back & R UE pain but reports being premedicated. Pt discussed progress in therapy & past issues with orthostatic hypotension; PT provided therapeutic listening. Assessed pt's orthostatic vitals:  Sitting: 126/62 mmHg, HR = 71 bpm Standing: 118/65 mmHg, HR = 77 bpm.   Throughout session pt completed all transfers (sit<>stand) with supervision, gait training x 150 ft + 150 ft [facility] & close supervision. Pt required intermittent cuing for sequencing of AD. Stair training completed x 12 steps (6") with single rail and supervision assist; pt required cuing for step-to compensatory pattern as pt would occasionally attempt to negotiate stairs with reciprocal pattern and experienced increased pain with LLE. Pt performed [place] Level B exercises (mini squats, backwards & sideways walking with and without UE support, tandem stance without BUE support, sit<>stand transfers without UE support). At end of session pt left in recliner in room with all needs within reach.   Treatment 2: Pt received sitting on EOB with husband present; pt agreeable to treatment and denied c/o pain. Pt's husband inquired about pt receiving CT scan prior to d/c and family education. Pt's husband plans to attend therapy sessions on Thursday for family education. Pt amb[facility] >1,000 ft [facility] & supervision; pt ambulated off of unit and outside, requiring seated rest break before return trip back to unit; activity focused on endurance training. Pt with ~2 losses of balance but able  to self recover with supervision assist from therapist. Therapist provided instructions for pt to find path unit>outside & pt required minimal cuing x 1 occasion for path finding outside>unit. Utilized [employer] Limits of Stability to focus on weight shifting and neuro re-ed; pt required LUE support and cuing for weight shifting with greatest difficulty shifting weight anteriolaterally. [facility] supervision assist. At end of session pt left in recliner with BLE elevated & all needs within reach.   Therapy Documentation Precautions:  Precautions Precautions: Fall Required Braces or Orthoses: Sling Restrictions Weight Bearing Restrictions: Yes RUE Weight Bearing: Non weight bearing Other Position/Activity Restrictions: sling for comfort   See Function Navigator for Current Functional Status.   Therapy/Group: Individual Therapy  Sandi MariscalVictoria M Clora Ohmer 04/27/2016, 12:19 PM

## 2016-04-28 ENCOUNTER — Inpatient Hospital Stay (HOSPITAL_COMMUNITY): Payer: Medicare Other | Admitting: Occupational Therapy

## 2016-04-28 ENCOUNTER — Inpatient Hospital Stay (HOSPITAL_COMMUNITY): Payer: Medicare Other | Admitting: Speech Pathology

## 2016-04-28 ENCOUNTER — Inpatient Hospital Stay (HOSPITAL_COMMUNITY): Payer: Medicare Other

## 2016-04-28 NOTE — Progress Notes (Signed)
Physical Therapy Weekly Progress Note  Patient Details  Name: Jillian Morris MRN: [id number] Date of Birth: 1945-09-27  Beginning of progress report period: April 21, 2016 End of progress report period: April 28, 2016  Today's Date: 04/28/2016 PT Individual Time: 4174-0814 PT Individual Time Calculation (min): 31 min    Patient has met 1 of 1 short term goals.  Pt has demonstrated improved standing balance, decreased need for assistance with ambulation, progressed to ambulating with SPC, and improving safety awareness.  Patient continues to demonstrate the following deficits: decreased cognition/memory, decreased dynamic standing balance, decreased safety awareness, and therefore will continue to benefit from skilled PT intervention to enhance overall performance with activity tolerance, balance, postural control, ability to compensate for deficits, awareness, knowledge of precautions and pt/family education.   Pt and husband would benefit from pt/family education for knowledge of pt's NWB RUE precautions and how to modify/complete tasks to maintain precuations.   Patient progressing toward long term goals..  Continue plan of care.  PT Short Term Goals Week 1:  PT Short Term Goal 1 (Week 1): STG = LTG due to short ELOS. Week 2:  PT Short Term Goal 1 (Week 2): STG = LTG due to estimated d/c date of 9/9.   Skilled Therapeutic Interventions/Progress Updates:    Pt received in bed noting 7/10 buttock & LE sciatic pain but reports being premedicated. Pt unable to recall this therapist informing her of extra session earlier this AM. Pt donned jacket long sitting in bed with supervision assist. Pt transferred to sitting EOB with supervision assist and donned shoes with supervision, requiring assistance to manage laces for time management. Pt ambulated 150 ft +150 ft room<>gym with SPC & close supervision, demonstrating proper sequencing & gait pattern with AD. Utilized nu-step, without RUE,  progressing up to Level 5 to increase pt's RPE, however pt noted increased knee pain with task so resistance reduced to level 4. Pt performed activity for 9 minutes 30 seconds for cardiopulmonary endurance & strength training. Pt noted 6/10 knee ache after task & 5/10 sciatic pain. At end of session pt transferred to supine in bed with cuing to not push through RUE and cuing management of jacket. Pt left in bed with all needs within reach & bed alarm set.   Therapy Documentation Precautions:  Precautions Precautions: Fall Required Braces or Orthoses: Sling Restrictions Weight Bearing Restrictions: Yes RUE Weight Bearing: Non weight bearing Other Position/Activity Restrictions: sling for comfort   See Function Navigator for Current Functional Status.  Therapy/Group: Individual Therapy  Waunita Schooner 04/28/2016, 5:05 PM

## 2016-04-28 NOTE — Progress Notes (Signed)
[facility]ILIT[facility]     PROGRESS NOTE    Subjective/Complaints: Happy that right hand swelling improved. Sleeping well. Pain better.   ROS: Pt denies fever, rash/itching, headache, blurred or double vision, nausea, vomiting, abdominal pain, diarrhea, chest pain, shortness of breath, palpitations, dysuria, dizziness, neck or back pain, bleeding, anxiety, or depression   Objective: Vital Signs: Blood pressure 128/62, pulse 74, temperature 98.2 F (36.8 C), temperature source Oral, resp. rate 16, height 5\' 4"  (1.626 m), weight 86.1 kg (189 lb 14.4 oz), SpO2 97 %. No results found. No results for input(s): WBC, HGB, HCT, PLT in the last 72 hours. No results for input(s): NA, K, CL, GLUCOSE, BUN, CREATININE, CALCIUM in the last 72 hours.  Invalid input(s): CO CBG (last 3)  No results for input(s): GLUCAP in the last 72 hours.  Wt Readings from Last 3 Encounters:  04/27/16 86.1 kg (189 lb 14.4 oz)  04/16/16 85.7 kg (188 lb 15 oz)  03/09/16 82.6 kg (182 lb)    Physical Exam:  Gen: NAD. Well-developed. Vital signs reviewed. HENT: no diplopia.  Bruising almost resolved on neck     Neck: Normal range of motion. Neck supple. No thyromegalypresent.  Cardiovascular: Normal rateand regular rhythm.  Respiratory: Effort normaland breath sounds normal. No respiratory distress.  GI: Soft. Bowel sounds are normal. She exhibits no distension.  Musculoskeletal:    B/l LE edema Neurological: She is alert. Tracks to all fields Oriented to person place and time. Follows commands. Improved insight  Sensation intact to light touch. Motor: LUE: 4+/5 proximal to distal. RUE Limited by splint B/l LE: Hip flexion 4-/5, knee extension 4/5, ankle dorsi/plantar flexion 4+/5 Skin: Skin is warmand dry  Assessment/Plan: 1. 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 Physiatrist is providing close team supervision and 24 hour management of active medical problems listed below. Physiatrist and rehab team continue to assess barriers to discharge/monitor patient progress toward functional and medical goals.  Function:  Bathing Bathing position   Position: Shower  Bathing parts Body parts bathed by patient: Right arm, Chest, Abdomen, Front perineal area, Buttocks, Right upper leg, Left upper leg, Right lower leg, Left lower leg Body parts bathed by helper: Back, Left arm  Bathing assist Assist Level: Touching or steadying assistance(Pt > 75%)      Upper Body Dressing/Undressing Upper body dressing   What is the patient wearing?: Pull over shirt/dress, Bra Bra - Perfomed by patient: Thread/unthread right bra strap, Thread/unthread left bra strap, Hook/unhook bra (pull down sports bra)   Pull over shirt/dress - Perfomed by patient: Thread/unthread right sleeve, Thread/unthread left sleeve, Put head through opening, Pull shirt over trunk          Upper body assist Assist Level: Supervision or verbal cues      Lower Body Dressing/Undressing Lower body dressing   What is the patient wearing?: Underwear, Socks, Shoes, Pants Underwear - Performed by patient: Thread/unthread right underwear leg, Thread/unthread left underwear leg, Pull underwear up/down   Pants- Performed by patient: Thread/unthread right pants leg, Thread/unthread left pants leg, Pull pants up/down Pants- Performed by helper: Fasten/unfasten pants Non-skid slipper socks- Performed by patient: Don/doff right sock, Don/doff left sock   Socks - Performed by patient: Don/doff right sock, Don/doff left sock Socks - Performed by helper: Don/doff right sock, Don/doff left sock (due to time constraints only) Shoes - Performed by patient: Don/doff  right shoe, Don/doff left shoe Shoes - Performed by helper: Fasten right, Fasten left          Lower body assist Assist for  lower body dressing: Supervision or verbal cues      Toileting Toileting   Toileting steps completed by patient: Adjust clothing prior to toileting, Performs perineal hygiene, Adjust clothing after toileting   Toileting Assistive Devices: Grab bar or rail  Toileting assist Assist level: Supervision or verbal cues   Transfers Chair/bed transfer   Chair/bed transfer method: Ambulatory Chair/bed transfer assist level: Supervision or verbal cues Chair/bed transfer assistive device: Cane, Orthosis     Locomotion Ambulation     Max distance: >1000 ft Assist level: Supervision or verbal cues   Wheelchair Wheelchair activity did not occur: Safety/medical concerns        Cognition Comprehension Comprehension assist level: Understands complex 90% of the time/cues 10% of the time  Expression Expression assist level: Expresses complex ideas: With extra time/assistive device  Social Interaction Social Interaction assist level: Interacts appropriately with others with medication or extra time (anti-anxiety, antidepressant).  Problem Solving Problem solving assist level: Solves basic 75 - 89% of the time/requires cueing 10 - 24% of the time  Memory Memory assist level: Recognizes or recalls 90% of the time/requires cueing < 10% of the time   Medical Problem List and Plan: 1. TBI/SAH/right distal radius fracture, right maxillary right zygomatic arch fracture  secondary to fall 04/16/2016                       -continue CIR therapies  -team conference today 2.  DVT Prophylaxis/Anticoagulation: SCDs. Monitor for any signs of DVT 3. Pain Management/Fibromyalgia: MS Contin 30 mg every 12 hours, MSIR 30 mg every 6 hours as needed  -pain control seems to be improving 4. Mood: Cymbalta 60 mg twice a day, Ativan 1 mg daily at bedtime--adjust to 9pm, Requip 1 mg daily at bedtime as needed, Xanax 0.25 mg twice daily as needed   -anxiety/MS seems better  -needs continued reassurance    5.  Neuropsych: This patient is capable of making decisions on her own behalf. 6. Skin/Wound Care: Routine skin checks   -may shower with right arm covered 7. Fluids/Electrolytes/Nutrition: continue to encourage PO 8. Right comminuted dorsally impacted fracture of the distal radius. No plan for surgical intervention at this time. Currently nonweightbearing.  -ORIF 04/23/16 per Dr. Merlyn Lot   -RUE NVI 9. Right maxillary zygomatic arch fracture. Follow-up per Dr. Suszanne Conners with conservative care   LOS (Days) 8 A FACE TO FACE EVALUATION WAS PERFORMED  Jillian Morris 04/28/2016 8:09 AM

## 2016-04-28 NOTE — Progress Notes (Signed)
Speech Language Pathology Weekly Progress Note  Patient Details  Name: Jillian Morris MRN: [id number] Date of Birth: 03-10-1946  Beginning of progress report period: April 20, 2016 End of progress report period: April 28, 2016   Short Term Goals: Week 1: SLP Short Term Goal 1 (Week 1): Patient will sustian attention to functional task for 10 minutes with Supervision level verbal cues SLP Short Term Goal 1 - Progress (Week 1): Met SLP Short Term Goal 2 (Week 1): Patient will utilize external aids to assist with recall of new and daily information with Min verbal cues SLP Short Term Goal 2 - Progress (Week 1): Met SLP Short Term Goal 3 (Week 1): Patient will label 2 cognitive change with Min question cues  SLP Short Term Goal 3 - Progress (Week 1): Met SLP Short Term Goal 4 (Week 1): Patient will request help an needed with Min assist verbal cues SLP Short Term Goal 4 - Progress (Week 1): Not met SLP Short Term Goal 5 (Week 1): Patient will solve mildly complex problems with Min assist verbal and visual cues to recognize and coorect errors SLP Short Term Goal 5 - Progress (Week 1): Not met    New Short Term Goals: Week 2: SLP Short Term Goal 1 (Week 2): Patient will request help as needed with Min assist verbal cues. SLP Short Term Goal 2 (Week 2): Patient will solve mildly complex problems with Min assist verbal and visual cues to recognize and correct errors. SLP Short Term Goal 3 (Week 2): Patient will appropriately modify routine activities to adapt to new cognitive and physical deficits with Min A verbal cues.   Weekly Progress Updates: Patient has made functional gains and has met 3 of 5 STG's this reporting period due to improved cognitive function. Currently, patient demonstrates behaviors consistent with a Rancho Level VIII and requires overall supervision-Min A verbal cues to complete functional and familiar tasks safely in regards to problem solving, recall and awareness and  is Mod I for attention to tasks. Patient and family education is ongoing and patient continues to require cueing for overall safety in regards to calling for assistance. Patient would benefit from continued skilled SLP intervention to maximize her cognitive function and overall functional independence prior to discharge home.     Intensity: Minumum of 1-2 x/day, 30 to 90 minutes Frequency: 3 to 5 out of 7 days Duration/Length of Stay: 9/9 Treatment/Interventions: Cognitive remediation/compensation;Cueing hierarchy;Environmental controls;Functional tasks;Internal/external aids;Patient/family education;Therapeutic Activities    [place], [place] 04/28/2016, 2:55 PM

## 2016-04-28 NOTE — Progress Notes (Signed)
Physical Therapy Session Note  Patient Details  Name: Jillian Morris MRN: [id number] Date of Birth: 02/03/1946  Today's Date: 04/28/2016 PT Individual Time: 0930-1030 PT Individual Time Calculation (min): 60 min    Short Term Goals: Week 1:  PT Short Term Goal 1 (Week 1): STG = LTG due to short ELOS.  Skilled Therapeutic Interventions/Progress Updates:    Session focused on functional balance and mobility using SPC, neuro re-ed for balance re-training, overall endurance, and stair negotiation. Pt requires overall supervision for mobility using SPC. Neuro re-ed including balance tasks on compliant surface (reaching outside BOS for functional simulation task and heel/toe raises), tandem walking, and retro gait. Dynamic gait through obstacle course to simulate home and community mobility including gait over compliant surface, through turns, stepping over thresholds, and curb step. Demonstrates decreased balance and control during tight turns but able to recover mild LOB without physical assist. Cues throughout session for overall safety awareness and memory. End of session set-up on EOB with call bell in reach and bed alarm on.  Therapy Documentation Precautions:  Precautions Precautions: Fall Required Braces or Orthoses: Sling Restrictions Weight Bearing Restrictions: Yes RUE Weight Bearing: Non weight bearing Other Position/Activity Restrictions: sling for comfort  Pain:  Premedicated for pain.    See Function Navigator for Current Functional Status.   Therapy/Group: Individual Therapy  Karolee StampsGray, Jedidiah Demartini Darrol PokeBrescia  Sherrel Shafer B. Verl Whitmore, PT, DPT  04/28/2016, 11:43 AM

## 2016-04-28 NOTE — Progress Notes (Signed)
Occupational Therapy Session Note  Patient Details  Name: Elie GoodyBrenda L Rivers MRN: [id number] Date of Birth: 03/27/1946  Today's Date: 04/28/2016 OT Individual Time: 1400-1431 OT Individual Time Calculation (min): 31 min     Short Term Goals: Week 1:  OT Short Term Goal 1 (Week 1): Pt will recall UB hemi-dressing technique with 1 VC in order to increase independence with ADLs OT Short Term Goal 2 (Week 1): Pt will complete 2 grooming tasks in standing in order to increase functional activity tolerance OT Short Term Goal 3 (Week 1): Pt will dress LB with supervision OT Short Term Goal 4 (Week 1): Pt will direct caregiver for proper donning technique of UE sling mod I  Skilled Therapeutic Interventions/Progress Updates:    Upon entering the room, pt seated on EOB with no c/o pain this session. Her husband is present this session or education. Pt demonstrated ability to don B socks with one handed technique and then donned B shoes while utilizing shoe buttons to fasten. Pt ambulated with SPC to ADL apartment with supervision and use of SPC. OT educating pt and caregiver on kitchen mobility safety based on description of home set up. Pt demonstrated ability to move one item across counter from one end to the other safely. Pt also attempting to obtain items from refrigerator with education regarding set up for safety. Also dicussed placement of snacks and anything pt regularly eats/needs on counter in order to decrease fall risk. Pt returning to room in same manner. Call bell and all needed items within reach upon exiting the room.   Therapy Documentation Precautions:  Precautions Precautions: Fall Required Braces or Orthoses: Sling Restrictions Weight Bearing Restrictions: Yes RUE Weight Bearing: Non weight bearing Other Position/Activity Restrictions: sling for comfort General: General OT Amount of Missed Time: 25 Minutes Vital Signs: Therapy Vitals Temp: 98.9 F (37.2 C) Temp Source:  Oral Pulse Rate: 71 Resp: 16 BP: 140/70 Patient Position (if appropriate): Sitting Oxygen Therapy SpO2: 97 % O2 Device: Not Delivered  See Function Navigator for Current Functional Status.   Therapy/Group: Individual Therapy  Lowella Gripittman, Margarita Bobrowski L 04/28/2016, 2:37 PM

## 2016-04-28 NOTE — Progress Notes (Signed)
Speech Language Pathology Daily Session Notes  Patient Details  Name: Jillian Morris MRN: [id number] Date of Birth: 06/11/1946  Today's Date: 04/28/2016 Session 1 SLP Individual Time: [id number] SLP Individual Time Calculation (min): 65 min  Session 2 SLP Individual Time: [id number] SLP Individual Time Calculation (min): 35 min  Short Term Goals: Week 2: SLP Short Term Goal 1 (Week 2): Patient will request help as needed with Min assist verbal cues. SLP Short Term Goal 2 (Week 2): Patient will solve mildly complex problems with Min assist verbal and visual cues to recognize and correct errors. SLP Short Term Goal 3 (Week 2): Patient will appropriately modify routine activities to adapt to new cognitive and physical deficits with Min A verbal cues.   Skilled Therapeutic Interventions:  Session 1: Skilled therapy intervention focused on cognitive goals. Patient independently exhibited intellectual awareness of PT and OT goals. Patient was aware of physical deficits, citing balance, strength, stamina, gait, fine motor, and visuospatial goals. Patient expressed that the goals of ST were more abstract and her understanding of those were not as clear; patient educated on her cognitive goals and progress. Patient independently utilized alternating attention for ~ 10 minutes by maintaining conversation while nurse was administering medications. Patient independently identified and redirected her conversation when she pursued a short tangent. Patient left upright EOB with bed alarm on all needs within reach. Continue current plan of care.   Session 2: Skilled therapy intervention focused on cognitive goals. Patient independently expressed intellectual awareness of need for mental flexibility when addressing routine activities at discharge, which is indicative of improved anticipatory awareness. Patient and spouse educated on patient's progress to date and goals between now and discharge. Initiated family  education with patient's husband in regard to patient's decreased safety awareness. Patient left upright EOB with all needs within reach and spouse present. Continue current plan of care.   Function:   Cognition Comprehension Comprehension assist level: Follows complex conversation/direction with extra time/assistive device  Expression   Expression assist level: Expresses complex ideas: With extra time/assistive device  Social Interaction Social Interaction assist level: Interacts appropriately with others with medication or extra time (anti-anxiety, antidepressant).  Problem Solving Problem solving assist level: Solves basic 90% of the time/requires cueing < 10% of the time  Memory Memory assist level: More than reasonable amount of time    Pain Pain Assessment Pain Assessment: No/denies pain  Therapy/Group: Individual Therapy  Caryn Sectionlison Calixto Pavel 04/28/2016, 3:20 PM

## 2016-04-28 NOTE — Progress Notes (Signed)
Occupational Therapy Session Note  Patient Details  Name: Jillian Morris MRN: [id number] Date of Birth: 11/21/1945  Today's Date: 04/28/2016 OT Individual Time: 1100-1125 OT Individual Time Calculation (min): 25 min  and Today's Date: 04/28/2016 OT Missed Time: 25 Minutes Missed Time Reason: Patient fatigue (mental and physical fatigue)     Short Term Goals: Week 1:  OT Short Term Goal 1 (Week 1): Pt will recall UB hemi-dressing technique with 1 VC in order to increase independence with ADLs OT Short Term Goal 2 (Week 1): Pt will complete 2 grooming tasks in standing in order to increase functional activity tolerance OT Short Term Goal 3 (Week 1): Pt will dress LB with supervision OT Short Term Goal 4 (Week 1): Pt will direct caregiver for proper donning technique of UE sling mod I  Skilled Therapeutic Interventions/Progress Updates:    Upon entering the room, pt supine in bed sleeping. Pt having difficulty staying awake during this session. Pt able to verbally recall morning sessions and what she engaged in during this sessions. Pt needing mod verbal questioning cues and needed to look at schedule in order to remember name's of therapist she worked with this morning. Pt verbalizing, "I need to work on my problem solving and mental flexibility apparently." Pt reports it is difficult to "think" and continue speaking secondary to fatigue. She requests to remain supine and declined any additional OT intervention at this time. Bed alarm activated and call bell within reach upon exiting the room.   Therapy Documentation Precautions:  Precautions Precautions: Fall Required Braces or Orthoses: Sling Restrictions Weight Bearing Restrictions: Yes RUE Weight Bearing: Non weight bearing Other Position/Activity Restrictions: sling for comfort General: General OT Amount of Missed Time: 25 Minutes  See Function Navigator for Current Functional Status.   Therapy/Group: Individual  Therapy  Lowella Gripittman, Robin Pafford L 04/28/2016, 11:46 AM

## 2016-04-29 ENCOUNTER — Inpatient Hospital Stay (HOSPITAL_COMMUNITY): Payer: Medicare Other | Admitting: Physical Therapy

## 2016-04-29 ENCOUNTER — Inpatient Hospital Stay (HOSPITAL_COMMUNITY): Payer: Medicare Other | Admitting: Speech Pathology

## 2016-04-29 ENCOUNTER — Inpatient Hospital Stay (HOSPITAL_COMMUNITY): Payer: Medicare Other | Admitting: Occupational Therapy

## 2016-04-29 NOTE — Progress Notes (Signed)
Social Work Patient ID: Jillian Morris, female   DOB: Apr 07, 1946, 70 y.o.   MRN: [id number]   Met with pt and spouse yesterday afternoon to review team conference information and then again today to discuss change in d/c date.  Both aware and agreeable to team's change in d/c to 9/8 and supervision goals.  Husband aware he is to complete his education tomorrow.  Review of DME and f/u needs.  No further concerns.  Caprina Wussow, LCSW

## 2016-04-29 NOTE — Progress Notes (Signed)
Speech Language Pathology Daily Session Note  Patient Details  Name: Jillian Morris MRN: [id number] Date of Birth: 02/25/1946  Today's Date: 04/29/2016 SLP Individual Time: 0730-0840 SLP Individual Time Calculation (min): 70 min   Short Term Goals: Week 2: SLP Short Term Goal 1 (Week 2): Patient will request help as needed with Min assist verbal cues. SLP Short Term Goal 2 (Week 2): Patient will solve mildly complex problems with Min assist verbal and visual cues to recognize and correct errors. SLP Short Term Goal 3 (Week 2): Patient will appropriately modify routine activities to adapt to new cognitive and physical deficits with Min A verbal cues.   Skilled Therapeutic Interventions:Skilled therapy intervention focused on cognitive goals. When asked to alter a routine task, patient demonstrated mental flexibility given Mod A verbal cues. Patient independently verbally expressed anticipatory awareness of cooking and bathroom safety when discussing discharge planning. Patient independently sustained attention to functional conversation for ~ 30 minutes. Patient demonstrated independent alternating attention in a maximum distraction environment for ~ 10 minutes. Patient left supine in bed with bed alarm on and all needs within reach. Continue current plan of care.   Function:   Cognition Comprehension Comprehension assist level: Understands complex 90% of the time/cues 10% of the time  Expression   Expression assist level: Expresses complex ideas: With extra time/assistive device  Social Interaction Social Interaction assist level: Interacts appropriately with others with medication or extra time (anti-anxiety, antidepressant).  Problem Solving Problem solving assist level: Solves complex 90% of the time/cues < 10% of the time  Memory Memory assist level: Recognizes or recalls 90% of the time/requires cueing < 10% of the time    Pain No/denies pain  Therapy/Group: Individual  Therapy  Caryn Sectionlison Kloee Ballew 04/29/2016, 11:37 AM

## 2016-04-29 NOTE — Patient Care Conference (Signed)
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 Fall 04/20/2016  . Multiple facial fractures (HCC) 04/20/2016  . Right wrist fracture 04/20/2016  . Closed fracture of right distal radius   . Closed fracture of zygomatic arch (HCC)   . Depression   . Fibromyalgia   . Traumatic subarachnoid bleed with LOC of 1 hour to 5 hours 59 minutes (HCC) 04/16/2016  . Achalasia 08/09/2013  . Stricture and stenosis of esophagus 03/30/2012  . Dysphagia, unspecified(787.20) 03/21/2012  . Special screening for malignant neoplasms, colon 03/21/2012  . Esophageal reflux 03/21/2012    Expected Discharge Date: Expected Discharge Date: 05/02/16  Team Members Present: Physician leading conference: Dr. Faith RogueZachary Swartz Social Worker Present: Amada JupiterLucy Castiel Lauricella, LCSW Nurse Present: Carmie EndAngie Joyce, RN PT Present: Karolee StampsAlison Gray, PT OT Present: Ardis Rowanom Lanier, Daneil DolinOTA;Katie Pittman, OT SLP Present: Feliberto Gottronourtney Payne, SLP PPS Coordinator present : Tora DuckMarie Noel, RN, CRRN     Current Status/Progress Goal Weekly Team Focus  Medical   improved pain. ORIF complete right radial fx. cognition showing improvmeent also  see prior  pain control, anxiety mgt   Bowel/Bladder   continent of bowel & bladder, LBM 04/26/16  Remain continent  Continue to monitor &  assist as needed   Swallow/Nutrition/ Hydration             ADL's   supervision overall with self care, needs to work on safety awareness and IADL skills  Supervision overall  ADL retraining, activity tolerance, cognitve retraining with focus on safety and sequencing, family ed   Mobility   supervision to steadying assist overall  supervision overall  d/c planning, family education, balance/neuro re-ed, strengthening, endurance, stairs, gait   Communication             Safety/Cognition/ Behavioral Observations  Overall supervision to min A  Supervision  Family education, awareness, mental flexibility, overall safety   Pain   Scheduled MScontin, MSIR, tylenol & vicodin, using all for right wrist & back pain  pain scale less than 4  Continue to assess & treat as needed   Skin   continuous bruising, fading to right elbow, arm, hand, left hand, right facial bruising  no new areas of skin breakdown  continue to assess skin q shift    Rehab Goals Patient on target to meet rehab goals: Yes *See Care Plan and progress notes for long and short-term goals.  Barriers to Discharge: balance, safety awareness    Possible Resolutions to Barriers:  supervision, adaptive techniques and mechanisms    Discharge Planning/Teaching Needs:  Home with spouse who can provide 24/7 assistance.  Teaching to be completed with spouse tomorrow.   Team Discussion:  Pt making excellent progress and was not set back by surgery last week.  Still some minor safety issues but husband appropriately directs.  On track to meet supervision goals.  Recommend HH follow up.  Revisions to Treatment Plan:  None   Continued Need for Acute Rehabilitation Level of Care: The patient requires daily medical management by a physician with specialized training in physical medicine and rehabilitation for the following conditions: Daily direction of a multidisciplinary physical rehabilitation program to ensure safe treatment while  eliciting the highest outcome that is of practical value to the patient.: Yes Daily medical management of patient stability for increased activity during participation in an intensive rehabilitation regime.: Yes Daily analysis of laboratory values and/or radiology reports with any subsequent need for medication adjustment of medical intervention for : Post surgical problems;Neurological problems  Jovann Luse 04/29/2016, 1:33 PM

## 2016-04-29 NOTE — Progress Notes (Signed)
[place] PHYSICAL MEDICINE & REHABILITATION     PROGRESS NOTE    Subjective/Complaints: Feeling well. Working with SLP. Asked if she could have a grounds pass.   ROS: Pt denies fever, rash/itching, headache, blurred or double vision, nausea, vomiting, abdominal pain, diarrhea, chest pain, shortness of breath, palpitations, dysuria, dizziness, neck or back pain, bleeding, anxiety, or depression   Objective: Vital Signs: Blood pressure (!) 142/74, pulse 69, temperature 98.4 F (36.9 C), temperature source Oral, resp. rate 16, height 5\' 4"  (1.626 m), weight 86.1 kg (189 lb 14.4 oz), SpO2 97 %. No results found. No results for input(s): WBC, HGB, HCT, PLT in the last 72 hours. No results for input(s): NA, K, CL, GLUCOSE, BUN, CREATININE, CALCIUM in the last 72 hours.  Invalid input(s): CO CBG (last 3)  No results for input(s): GLUCAP in the last 72 hours.  Wt Readings from Last 3 Encounters:  04/27/16 86.1 kg (189 lb 14.4 oz)  04/16/16 85.7 kg (188 lb 15 oz)  03/09/16 82.6 kg (182 lb)    Physical Exam:  Gen: NAD. Well-developed. Vital signs reviewed. HENT: no diplopia.  Bruising almost resolved on neck     Neck: Normal range of motion. Neck supple. No thyromegalypresent.  Cardiovascular: Normal rateand regular rhythm.  Respiratory: Effort normaland breath sounds normal. No respiratory distress.  GI: Soft. Bowel sounds are normal. She exhibits no distension.  Musculoskeletal:    B/l LE edema improved Neurological: She is alert. Tracks to all fields Oriented to person place and time. Follows commands. Improved insight and awareness.   Sensation intact to light touch. Motor: LUE: 4+/5 proximal to distal. RUE Limited by splint B/l LE: Hip flexion 4-/5, knee extension 4/5, ankle dorsi/plantar flexion 4+/5. Good sitting balance Skin: Skin is warmand dry  Assessment/Plan: 1. 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 Physiatrist is providing close team supervision and 24 hour management of active medical problems listed below. Physiatrist and rehab team continue to assess barriers to discharge/monitor patient progress toward functional and medical goals.  Function:  Bathing Bathing position   Position: Shower  Bathing parts Body parts bathed by patient: Right arm, Chest, Abdomen, Front perineal area, Buttocks, Right upper leg, Left upper leg, Right lower leg, Left lower leg Body parts bathed by helper: Back, Left arm  Bathing assist Assist Level: Touching or steadying assistance(Pt > 75%)      Upper Body Dressing/Undressing Upper body dressing   What is the patient wearing?: Button up shirt Bra - Perfomed by patient: Thread/unthread right bra strap, Thread/unthread left bra strap, Hook/unhook bra (pull down sports bra)   Pull over shirt/dress - Perfomed by patient: Thread/unthread right sleeve, Thread/unthread left sleeve, Put head through opening, Pull shirt over trunk   Button up shirt - Perfomed by patient: Thread/unthread right sleeve, Thread/unthread left sleeve, Pull shirt around back Button up shirt - Perfomed by helper: Button/unbutton shirt    Upper body assist Assist Level: Touching or steadying assistance(Pt > 75%)      Lower Body Dressing/Undressing Lower body dressing   What is the patient wearing?: Socks, Shoes Underwear - Performed by patient: Thread/unthread right underwear leg, Thread/unthread left underwear leg, Pull underwear up/down   Pants- Performed by patient: Thread/unthread right pants leg, Thread/unthread left pants leg, Pull pants up/down Pants- Performed by helper: Fasten/unfasten pants Non-skid slipper socks- Performed by patient: Don/doff right sock, Don/doff left sock   Socks -  Performed by patient: Don/doff right sock, Don/doff left sock Socks - Performed by helper: Don/doff right sock,  Don/doff left sock (due to time constraints only) Shoes - Performed by patient: Don/doff right shoe, Don/doff left shoe, Fasten right, Fasten left Shoes - Performed by helper: Fasten right, Fasten left          Lower body assist Assist for lower body dressing: Supervision or verbal cues      Toileting Toileting   Toileting steps completed by patient: Adjust clothing prior to toileting, Performs perineal hygiene, Adjust clothing after toileting   Toileting Assistive Devices: Grab bar or rail  Toileting assist Assist level: Supervision or verbal cues   Transfers Chair/bed transfer   Chair/bed transfer method: Ambulatory Chair/bed transfer assist level: Supervision or verbal cues Chair/bed transfer assistive device: Cane, Orthosis     Locomotion Ambulation     Max distance: 150 ft  Assist level: Supervision or verbal cues   Wheelchair Wheelchair activity did not occur: Safety/medical concerns        Cognition Comprehension Comprehension assist level: Follows complex conversation/direction with extra time/assistive device  Expression Expression assist level: Expresses complex ideas: With extra time/assistive device  Social Interaction Social Interaction assist level: Interacts appropriately with others with medication or extra time (anti-anxiety, antidepressant).  Problem Solving Problem solving assist level: Solves basic 90% of the time/requires cueing < 10% of the time  Memory Memory assist level: More than reasonable amount of time   Medical Problem List and Plan: 1. TBI/SAH/right distal radius fracture, right maxillary right zygomatic arch fracture  secondary to fall 04/16/2016                       -continue CIR therapies  -progressing toward dc 9/9 2.  DVT Prophylaxis/Anticoagulation: SCDs. Monitor for any signs of DVT 3. Pain Management/Fibromyalgia: MS Contin 30 mg every 12 hours, MSIR 30 mg every 6 hours as needed  -pain control much better 4. Mood: Cymbalta 60 mg  twice a day, Ativan 1 mg daily at bedtime--adjust to 9pm, Requip 1 mg daily at bedtime as needed, Xanax 0.25 mg twice daily as needed   -anxiety/MS seems better  -needs continued reassurance    5. Neuropsych: This patient is capable of making decisions on her own behalf. 6. Skin/Wound Care: Routine skin checks   -may shower with right arm covered 7. Fluids/Electrolytes/Nutrition: continue to encourage PO 8. Right comminuted dorsally impacted fracture of the distal radius. No plan for surgical intervention at this time. Currently nonweightbearing.  -ORIF 04/23/16 per Dr. Merlyn Lot   -RUE NVI 9. Right maxillary zygomatic arch fracture. Follow-up per Dr. Suszanne Conners with conservative care   LOS (Days) 9 A FACE TO FACE EVALUATION WAS PERFORMED  SWARTZ,ZACHARY T 04/29/2016 8:16 AM

## 2016-04-29 NOTE — Progress Notes (Signed)
Occupational Therapy Session Note  Patient Details  Name: Jillian Morris Carelock MRN: [id number] Date of Birth: 05/28/1946  Today's Date: 04/29/2016 OT Individual Time: 1400-1500 OT Individual Time Calculation (min): 60 min     Short Term Goals: Week 1:  OT Short Term Goal 1 (Week 1): Pt will recall UB hemi-dressing technique with 1 VC in order to increase independence with ADLs OT Short Term Goal 2 (Week 1): Pt will complete 2 grooming tasks in standing in order to increase functional activity tolerance OT Short Term Goal 3 (Week 1): Pt will dress LB with supervision OT Short Term Goal 4 (Week 1): Pt will direct caregiver for proper donning technique of UE sling mod I  Skilled Therapeutic Interventions/Progress Updates:    Pt seen for OT session focusing on ADL re-training and caregiver training. Pt sitting EOB upon arrival with husband present and agreeable to tx session. Modified bathing task completed at sink with set-up, pt only desiring to wash LEs.  Pt issued thigh high TED hose for edema management. Donned TED hose total A and educated pt and caregiver regarding purpose and wear schedule of TEDs- both voiced understanding. With encouragement, pt willing to attempt ambulation without AD. Completed at supervision level. Pt very excited regarding current physical/ mobility level, stating she hasn't walked this fast in over 2 years.  In ADL apartment, Pt completed simulated tub bench transfer. Completed at supervision level following demonstration. Educated husband regarding proper set-up of tub bench and also discussed AE/ safety equipment for bathroom at home including grab bars and hand held shower. Pt completed sit <> stand from low soft surface couch, with supervision and increased effort. Reviewed techniques for sit <> stands for more difficult transfers. Pt returned to room at end of session, left sitting EOB with husband present and all needs in reach. Educated throughout session  regarding role of OT, continuum of care, DME, fall risk, and d/c planning.   Therapy Documentation Precautions:  Precautions Precautions: Fall Required Braces or Orthoses: Sling Restrictions Weight Bearing Restrictions: Yes RUE Weight Bearing: Non weight bearing Other Position/Activity Restrictions: sling for comfort Pain: Pain Assessment Pain Assessment: 0-10 Pain Score: 2   See Function Navigator for Current Functional Status.   Therapy/Group: Individual Therapy  Lewis, Rajvir Ernster C 04/29/2016, 3:12 PM

## 2016-04-29 NOTE — Progress Notes (Addendum)
Physical Therapy Session Note  Patient Details  Name: Jillian Morris MRN: [id number] Date of Birth: 12/05/1945  Today's Date: 04/29/2016 PT Individual Time: 0905-1000 and 1500-1554 PT Individual Time Calculation (min): 55 min and 54 min   Short Term Goals: Week 2:  PT Short Term Goal 1 (Week 2): STG = LTG due to estimated d/c date of 9/9.  Skilled Therapeutic Interventions/Progress Updates:    Treatment 1: Patient in bed upon arrival, reporting feeling "shaky on the inside" and "crummy." Patient agreeable to OOB to locate RN to request anxiety medication with meds passed during session. Patient performed bed mobility, ambulation in room and in hallway with and without SPC per patient request, ambulation while carrying full cup of water using LUE in controlled environment, stair training up/down 12 (6") stairs using 1 rail with verbal cues for step-to pattern due to L knee pain, picking up a variety of objects from floor using  with supervision overall and no LOB. Performed standing Level A OTAGO HEP using countertop for LUE support: knee flexion and hip abduction 2 x 10 each LE, and mini squats x 20. To challenge dynamic standing balance on compliant surface, performed multidirectional reaching for horseshoes with LUE within and slightly outside BOS and tossed horseshoes with LUE while standing on foam pad with close supervision and 1-2 LOB requiring assist to recover. Patient desires to DC home without using cane as she reports it helps psychologically to not have the cane, therefore practiced most of session without AD. Patient left sitting in recliner, reinforced safety recommendations to call for assistance in room with all needs in reach.   Treatment 2: Patient sitting EOB with husband present. Discussed with patient and husband moving up DC date due to progress toward goals, education regarding use of SPC for community ambulation and when patient's L knee is more painful and no AD in home, and  f/u OPPT. Patient's husband plans to complete family training tomorrow. Gait training without AD community distances from room <> [employer] main entrance with distant supervision and 1 prolonged seated rest break. Patient able to demonstrate increased gait speed on trip to main entrance but slower gait and more antalgic gait pattern upon return to rehab unit due to fatigue and increased L knee pain 5/10. Patient retrieved water from RN station and carried cup to and from gym. Performed NuStep using BLE only for strengthening and muscular endurance at level 2-4 x 10 min with 1 rest break. Patient requested to terminate session early due to fatigue and L knee pain. Patient left sitting EOB with all needs within reach. Patient missed 21 min due to refusal to participate.  Therapy Documentation Precautions:  Precautions Precautions: Fall Required Braces or Orthoses: Sling Restrictions Weight Bearing Restrictions: Yes RUE Weight Bearing: Non weight bearing Other Position/Activity Restrictions: sling for comfort Pain: Pain Assessment Pain Assessment: 0-10 Pain Score: 3  Pain Type: Chronic pain Pain Location: Knee Pain Orientation: Left Pain Descriptors / Indicators: Aching Pain Onset: With Activity Pain Intervention(s): Rest;Emotional support   See Function Navigator for Current Functional Status.   Therapy/Group: Individual Therapy  Kerney ElbeVarner, Laynee Lockamy A 04/29/2016, 10:23 AM

## 2016-04-30 ENCOUNTER — Inpatient Hospital Stay (HOSPITAL_COMMUNITY): Payer: Medicare Other | Admitting: Physical Therapy

## 2016-04-30 ENCOUNTER — Inpatient Hospital Stay (HOSPITAL_COMMUNITY): Payer: Medicare Other | Admitting: Occupational Therapy

## 2016-04-30 ENCOUNTER — Inpatient Hospital Stay (HOSPITAL_COMMUNITY): Payer: Medicare Other | Admitting: Speech Pathology

## 2016-04-30 NOTE — Progress Notes (Signed)
Occupational Therapy Session Note  Patient Details  Name: Jillian Morris MRN: [id number] Date of Birth: 02/10/1946  Today's Date: 04/30/2016 OT Individual Time: 1000-1100 OT Individual Time Calculation (min): 60 min     Short Term Goals:Week 1:  OT Short Term Goal 1 (Week 1): Pt will recall UB hemi-dressing technique with 1 VC in order to increase independence with ADLs OT Short Term Goal 2 (Week 1): Pt will complete 2 grooming tasks in standing in order to increase functional activity tolerance OT Short Term Goal 3 (Week 1): Pt will dress LB with supervision OT Short Term Goal 4 (Week 1): Pt will direct caregiver for proper donning technique of UE sling mod I      Skilled Therapeutic Interventions/Progress Updates:    Pt seen for skilled OT to facilitate family education and ADL retraining with adaptive strategies. Pt declined a shower this am, but did want to bathe at sink. Pt is now ambulatory in the room without an AD. She needs extra time between transition movements for dizziness to subside. Provided long handled sponge to pt to allow her to wash her L upper arm using L hand as R hand in splint.  Reviewed with spouse (demonstration) technique to don long TEDs to avoid straining his back.  Discussed tub bench and need to practice that transfer one more time. Overall, pt only requires close S due to decreased balance, dizziness, and decreased cognitive skills. Pt will be ready for discharge home tomorrow.   Therapy Documentation Precautions:  Precautions Precautions: Fall Required Braces or Orthoses: Sling Restrictions Weight Bearing Restrictions: Yes RUE Weight Bearing: Non weight bearing Other Position/Activity Restrictions: sling for comfort  Pain: Pain Assessment Pain Assessment: No/denies pain ADL:  See Function Navigator for Current Functional Status.   Therapy/Group: Individual Therapy  SAGUIER,JULIA 04/30/2016, 10:39 AM

## 2016-04-30 NOTE — Progress Notes (Signed)
Speech Language Pathology Daily Session Note  Patient Details  Name: Jillian Morris MRN: [id number] Date of Birth: 01/17/1946  Today's Date: 04/30/2016 SLP Individual Time: 0728-0825 SLP Individual Time Calculation (min): 57 min   Short Term Goals: Week 2: SLP Short Term Goal 1 (Week 2): Patient will request help as needed with Min assist verbal cues. SLP Short Term Goal 2 (Week 2): Patient will solve mildly complex problems with Min assist verbal and visual cues to recognize and correct errors. SLP Short Term Goal 3 (Week 2): Patient will appropriately modify routine activities to adapt to new cognitive and physical deficits with Min A verbal cues.   Skilled Therapeutic Interventions:Skilled therapy intervention focused on cognitive goals. Patient was administered the MoCA 8.1 with one visuospatial item untested due to impairment of right arm. Patient scored 27/29 points with a score of 26 or above considered normal. Patient demonstrated deficit in the area of timely word retrieval. Patient independently expressed intellectual understanding of deficits and need for supervision when ambulating, but attempted to ambulate without asking for assistance, showing lack of emergent awareness. Clinician provided reeducation on the need for supervision whenever ambulating. When generating a daily schedule of routine activities, patient once again independently expressed intellectual awareness of need for supervision at home. Patient independently maintained divided attention for ~ 10 minutes in a moderately distracting environment while eating breakfast and attending to therapeutic activity. Patient left upright EOB with all needs within reach and bed alarm on. Continue current plan of care.   Function:  Cognition Comprehension Comprehension assist level: Understands complex 90% of the time/cues 10% of the time  Expression   Expression assist level: Expresses complex 90% of the time/cues < 10% of the  time  Social Interaction Social Interaction assist level: Interacts appropriately with others with medication or extra time (anti-anxiety, antidepressant).  Problem Solving Problem solving assist level: Solves complex 90% of the time/cues < 10% of the time  Memory Memory assist level: Requires cues to use assistive device    Pain Pain Assessment Pain Assessment: No/denies pain  Therapy/Group: Individual Therapy  Caryn Sectionlison [name] 04/30/2016, 12:11 PM

## 2016-04-30 NOTE — Progress Notes (Signed)
Occupational Therapy Session Note  Patient Details  Name: Jillian GoodyBrenda L Striplin MRN: [id number] Date of Birth: 05/20/1946  Today's Date: 04/30/2016 OT Individual Time: [id number] OT Individual Time Calculation (min): 38 min     Short Term Goals: Week 1:  OT Short Term Goal 1 (Week 1): Pt will recall UB hemi-dressing technique with 1 VC in order to increase independence with ADLs OT Short Term Goal 2 (Week 1): Pt will complete 2 grooming tasks in standing in order to increase functional activity tolerance OT Short Term Goal 3 (Week 1): Pt will dress LB with supervision OT Short Term Goal 4 (Week 1): Pt will direct caregiver for proper donning technique of UE sling mod I  Skilled Therapeutic Interventions/Progress Updates:    Upon entering the room, pt seated in recliner chair with 8/10 c/o pain in lower back this session. Pt reports all the activities she has engaged in today with therapy as an explanation for her level of fatigue and increased pain. OT placed hot pack on lower back for pain management. Pt declined exiting the room or getting up from wheelchair. OT discussed continued discharge recommendation in regards to fall risk, energy conservation, and bathroom set up. Pt verbalized understanding but declined OT intervention any further this session. Pt remained in recliner chair with NT present in room checking vitals. Call bell and all needed items within reach upon exiting the room.   Therapy Documentation Precautions:  Precautions Precautions: None Required Braces or Orthoses: Sling Restrictions Weight Bearing Restrictions: Yes RUE Weight Bearing: Non weight bearing Other Position/Activity Restrictions: sling for comfort General: General OT Amount of Missed Time: 22 Minutes Vital Signs: Therapy Vitals Temp: 98.4 F (36.9 C) Temp Source: Oral Pulse Rate: 66 Resp: 16 BP: (!) 151/72 Patient Position (if appropriate): Lying Oxygen Therapy SpO2: 98 % O2 Device: Not  Delivered Pain: Pain Assessment Pain Assessment: Faces Pain Score: 2  Faces Pain Scale: Hurts a little bit Pain Type: Acute pain Pain Location: Knee Pain Orientation: Left Pain Descriptors / Indicators: Aching Pain Onset: On-going Patients Stated Pain Goal: 3 Pain Intervention(s): Repositioned;Rest ADL: ADL ADL Comments: Supervision overall  See Function Navigator for Current Functional Status.   Therapy/Group: Individual Therapy  Lowella Gripittman, Rathana Viveros L 04/30/2016, 3:52 PM

## 2016-04-30 NOTE — Progress Notes (Signed)
Physical Therapy Session Note  Patient Details  Name: Jillian Morris MRN: [id number] Date of Birth: 12/17/1945  Today's Date: 04/30/2016 PT Individual Time: 1100-1200 PT Individual Time Calculation (min): 60 min    Short Term Goals: Week 1:  PT Short Term Goal 1 (Week 1): STG = LTG due to short ELOS.  Skilled Therapeutic Interventions/Progress Updates:     Patient received standing in room with husband present and agreeable to PT.   PT instructed patient in gait training for 126f x 2 and 1267fwith Supervision A with min cues for improved foot clearance intermittently throughout treatment. Patient able to correct following each instruction Patient also instructed in gait over ramp and uneven mulched surface for 4060fotal with supervision A from PT for safety.   PT instructed patient in Berg balance test; see below for results.   Patient instructed in stair negotiation with supervision A with min cues for proper step-to gait pattern to reduce stress in L knee.   PT instructed patient in Dynamic standing balance on Wii fit for improved weight shifting in all planes with visual feed back through various games including tilt table, penguin slide, and soccer. All games completed x 3 with noted improvement with each trail. PT also noted decreased weight shift to L due to knee pain with increased time in standing.   Patient returned to room and left sitting in recliner with all needs met and call bell in reach.      Therapy Documentation Precautions:  Precautions Precautions: None Required Braces or Orthoses: Sling Restrictions Weight Bearing Restrictions: Yes RUE Weight Bearing: Non weight bearing Other Position/Activity Restrictions: sling for comfort General:   Vital Signs: Therapy Vitals Temp: 98.4 F (36.9 C) Temp Source: Oral Pulse Rate: 66 Resp: 16 BP: (!) 151/72 Patient Position (if appropriate): Lying Oxygen Therapy SpO2: 98 % O2 Device: Not  Delivered Pain: Pain Assessment Pain Assessment: Faces Faces Pain Scale: Hurts a little bit Pain Type: Acute pain Pain Location: Knee Pain Orientation: Left Pain Descriptors / Indicators: Aching Pain Onset: On-going Pain Intervention(s): Repositioned;Rest  Balance: Balance Balance Assessed: Yes Standardized Balance Assessment Standardized Balance Assessment: Berg Balance Test Berg Balance Test Sit to Stand: Able to stand without using hands and stabilize independently Standing Unsupported: Able to stand safely 2 minutes Sitting with Back Unsupported but Feet Supported on Floor or Stool: Able to sit safely and securely 2 minutes Stand to Sit: Sits safely with minimal use of hands Transfers: Able to transfer safely, minor use of hands Standing Unsupported with Eyes Closed: Able to stand 10 seconds safely Standing Ubsupported with Feet Together: Able to place feet together independently and stand 1 minute safely From Standing, Reach Forward with Outstretched Arm: Can reach confidently >25 cm (10") From Standing Position, Pick up Object from Floor: Able to pick up shoe safely and easily From Standing Position, Turn to Look Behind Over each Shoulder: Looks behind from both sides and weight shifts well Turn 360 Degrees: Able to turn 360 degrees safely one side only in 4 seconds or less Standing Unsupported, Alternately Place Feet on Step/Stool: Able to stand independently and safely and complete 8 steps in 20 seconds Standing Unsupported, One Foot in Front: Able to plae foot ahead of the other independently and hold 30 seconds Standing on One Leg: Able to lift leg independently and hold equal to or more than 3 seconds Total Score: 52 Static Standing Balance Static Standing - Balance Support: During functional activity Static Standing - Level of  Assistance: 6: Modified independent (Device/Increase time) Dynamic Standing Balance Dynamic Standing - Balance Support: During functional  activity Dynamic Standing - Level of Assistance: 5: Stand by assistance   See Function Navigator for Current Functional Status.   Therapy/Group: Individual Therapy  Lorie Phenix 04/30/2016, 6:11 PM

## 2016-04-30 NOTE — Progress Notes (Signed)
Occupational Therapy Discharge Summary  Patient Details  Name: Tiwanda L Winfree MRN: [id number] Date of Birth: 05/11/1946    Patient has met 9 of 9 long term goals due to improved activity tolerance, improved balance, postural control and ability to compensate for deficits.  Patient to discharge at overall Supervision level.  Patient's care partner is independent to provide the necessary physical and cognitive assistance at discharge.    Reasons goals not met: n/a  Recommendation:  No further OT services recommended at this time.   Equipment: tub bench  Reasons for discharge: treatment goals met  Patient/family agrees with progress made and goals achieved: Yes  OT Discharge Precautions/Restrictions  Precautions Precautions: Fall Required Braces or Orthoses: Sling Restrictions RUE Weight Bearing: Non weight bearing Other Position/Activity Restrictions: sling for comfort ADL ADL ADL Comments: Supervision overall Vision/Perception  Vision- History Baseline Vision/History: Wears glasses Wears Glasses: Reading only Patient Visual Report: Other (comment) (pt reports vision has improved, no longer blurry) Perception Comments: WFL  Cognition Overall Cognitive Status: Impaired/Different from baseline Memory: Impaired Memory Impairment: Decreased recall of new information;Retrieval deficit Sensation Sensation Light Touch: Appears Intact Stereognosis: Appears Intact Hot/Cold: Appears Intact Proprioception: Appears Intact Coordination Fine Motor Movements are Fluid and Coordinated: No Coordination and Movement Description: Impaired due to UE injury (NWB in splint) Motor  Motor Motor: Within Functional Limits Mobility    Supervision with transfers Trunk/Postural Assessment  Cervical Assessment Cervical Assessment: Within Functional Limits Thoracic Assessment Thoracic Assessment: Within Functional Limits Lumbar Assessment Lumbar Assessment: Within Functional  Limits Postural Control Postural Control: Within Functional Limits  Balance Standardized Balance Assessment Standardized Balance Assessment: Berg Balance Test Berg Balance Test Sit to Stand: Able to stand without using hands and stabilize independently Standing Unsupported: Able to stand safely 2 minutes Sitting with Back Unsupported but Feet Supported on Floor or Stool: Able to sit safely and securely 2 minutes Stand to Sit: Sits safely with minimal use of hands Transfers: Able to transfer safely, minor use of hands Standing Unsupported with Eyes Closed: Able to stand 10 seconds safely Standing Ubsupported with Feet Together: Able to place feet together independently and stand 1 minute safely From Standing, Reach Forward with Outstretched Arm: Can reach confidently >25 cm (10") From Standing Position, Pick up Object from Floor: Able to pick up shoe safely and easily From Standing Position, Turn to Look Behind Over each Shoulder: Looks behind from both sides and weight shifts well Turn 360 Degrees: Able to turn 360 degrees safely one side only in 4 seconds or less Standing Unsupported, Alternately Place Feet on Step/Stool: Able to stand independently and safely and complete 8 steps in 20 seconds Standing Unsupported, One Foot in Front: Able to plae foot ahead of the other independently and hold 30 seconds Standing on One Leg: Able to lift leg independently and hold equal to or more than 3 seconds Total Score: 52 Dynamic Sitting Balance Dynamic Sitting - Level of Assistance: 7: Independent Static Standing Balance Static Standing - Level of Assistance: 6: Modified independent (Device/Increase time) Dynamic Standing Balance Dynamic Standing - Level of Assistance: 5: Stand by assistance Extremity/Trunk Assessment RUE Assessment RUE Assessment: Not tested LUE Assessment LUE Assessment: Within Functional Limits   See Function Navigator for Current Functional  Status.  SAGUIER,JULIA 04/30/2016, 12:32 PM  

## 2016-04-30 NOTE — Progress Notes (Signed)
Physical Therapy Discharge Summary  Patient Details  Name: Jillian Morris MRN: [id number] Date of Birth: May 31, 1946  Today's Date: 04/30/2016 PT Individual Time: 4163-8453 PT Individual Time Calculation (min): 40 min   Patient has met 9 of 9 long term goals due to improved activity tolerance, improved balance, improved postural control, increased strength, increased range of motion, decreased pain, ability to compensate for deficits, improved attention, improved awareness and improved coordination.  Patient to discharge at an ambulatory level Supervision.   Patient's care partner is independent to provide the necessary physical and cognitive assistance at discharge.  Reasons goals not met: Floor Transfer goal NA due to NWB RUE and L knee pain  Recommendation:  Patient will benefit from ongoing skilled PT services in outpatient setting to continue to advance safe functional mobility, address ongoing impairments in strength, balance, activity tolerance, pain, and minimize fall risk.  Equipment: No equipment provided  Reasons for discharge: treatment goals met and discharge from hospital  Patient/family agrees with progress made and goals achieved: Yes  Skilled Therapeutic Intervention Patient at mod I-supervision level without AD for ambulation in room to pick out clothing for tomorrow and community/outdoor ambulation including uneven surfaces, inclines/declines, 8 outdoor stairs using 1 rail, up/down curb 2 x > 1000 ft. Reviewed OTAG[employer] HEP handout with patient but patient declined to practice exercises. Patient left sitting EOB to await next therapy session. Patient and husband with no further questions/concerns regarding discharge home planned tomorrow.   PT Discharge Precautions/RestrictionsPrecautions Precautions: None Required Braces or Orthoses: Sling Restrictions Weight Bearing Restrictions: Yes RUE Weight Bearing: Non weight bearing Other Position/Activity Restrictions: sling  for comfort Pain Pain Assessment Pain Assessment: Faces Faces Pain Scale: Hurts a little bit Pain Type: Acute pain Pain Location: Knee Pain Orientation: Left Pain Descriptors / Indicators: Aching Pain Onset: On-going Pain Intervention(s): Repositioned;Rest Vision/Perception  Perception Comments: WFL  Cognition Overall Cognitive Status: Impaired/Different from baseline Memory: Impaired Memory Impairment: Decreased recall of new information;Retrieval deficit Sensation Sensation Light Touch: Appears Intact Stereognosis: Appears Intact Hot/Cold: Appears Intact Proprioception: Appears Intact Coordination Gross Motor Movements are Fluid and Coordinated: Yes Fine Motor Movements are Fluid and Coordinated: No Coordination and Movement Description: Impaired due to UE injury (NWB in splint) Motor  Motor Motor: Within Functional Limits  Mobility Bed Mobility Bed Mobility: Supine to Sit;Sit to Supine Supine to Sit: 6: Modified independent (Device/Increase time) Sit to Supine: 6: Modified independent (Device/Increase time) Transfers Transfers: Yes Sit to Stand: 6: Modified independent (Device/Increase time);With upper extremity assist Stand to Sit: 6: Modified independent (Device/Increase time);With upper extremity assist Locomotion  Ambulation Ambulation: Yes Ambulation/Gait Assistance: 6: Modified independent (Device/Increase time) Ambulation Distance (Feet): 1000 Feet Assistive device: None Gait Gait: Yes Gait Pattern: Impaired Gait Pattern: Decreased stance time - left;Decreased step length - right;Step-through pattern;Decreased trunk rotation Gait velocity: decreased Stairs / Additional Locomotion Stairs: Yes Stairs Assistance: 5: Supervision Stair Management Technique: One rail Left Number of Stairs: 12 Height of Stairs: 3 (8 3", 4 6") Ramp: 6: Modified independent (Device) Curb: 5: Building services engineer Mobility: No  Trunk/Postural  Assessment  Cervical Assessment Cervical Assessment: Within Functional Limits Thoracic Assessment Thoracic Assessment: Within Functional Limits Lumbar Assessment Lumbar Assessment: Within Functional Limits Postural Control Postural Control: Within Functional Limits  Balance Balance Balance Assessed: Yes Standardized Balance Assessment Standardized Balance Assessment: Berg Balance Test Berg Balance Test Sit to Stand: Able to stand without using hands and stabilize independently Standing Unsupported: Able to stand safely 2 minutes Sitting with Back Unsupported  but Feet Supported on Floor or Stool: Able to sit safely and securely 2 minutes Stand to Sit: Sits safely with minimal use of hands Transfers: Able to transfer safely, minor use of hands Standing Unsupported with Eyes Closed: Able to stand 10 seconds safely Standing Ubsupported with Feet Together: Able to place feet together independently and stand 1 minute safely From Standing, Reach Forward with Outstretched Arm: Can reach confidently >25 cm (10") From Standing Position, Pick up Object from Floor: Able to pick up shoe safely and easily From Standing Position, Turn to Look Behind Over each Shoulder: Looks behind from both sides and weight shifts well Turn 360 Degrees: Able to turn 360 degrees safely one side only in 4 seconds or less Standing Unsupported, Alternately Place Feet on Step/Stool: Able to stand independently and safely and complete 8 steps in 20 seconds Standing Unsupported, One Foot in Front: Able to plae foot ahead of the other independently and hold 30 seconds Standing on One Leg: Able to lift leg independently and hold equal to or more than 3 seconds Total Score: 52 Dynamic Sitting Balance Dynamic Sitting - Level of Assistance: 7: Independent Static Standing Balance Static Standing - Balance Support: During functional activity Static Standing - Level of Assistance: 6: Modified independent (Device/Increase  time) Dynamic Standing Balance Dynamic Standing - Balance Support: During functional activity Dynamic Standing - Level of Assistance: 5: Stand by assistance Extremity Assessment  RUE Assessment RUE Assessment: Not tested LUE Assessment LUE Assessment: Within Functional Limits RLE Assessment RLE Assessment: Within Functional Limits LLE Assessment LLE Assessment: Within Functional Limits   See Function Navigator for Current Functional Status.  Carney Living A 04/30/2016, 3:06 PM

## 2016-04-30 NOTE — Progress Notes (Signed)
Physical Therapy Note  Patient Details  Name: Jillian Morris MRN: [id number] Date of Birth: 05/31/1946 Today's Date: 04/30/2016    Time: 830-855 25 minutes  1:1 No c/o pain. Gait throughout unit without AD with supervision, no LOB noted, pt with good awareness of need to stand and let blood pressure stabilize before moving.  [place] exercises performed with pt able to perform sequence with minimal rest breaks. Car transfer to simulated sedan with supervision. Stair negotiation with 1 railing and uneven surfaces and ramp training with supervision, no LOB. Pt excited for d/c home.   DONAWERTH,KAREN 04/30/2016, 8:56 AM

## 2016-04-30 NOTE — Discharge Summary (Signed)
Discharge summary job [id number]

## 2016-04-30 NOTE — Progress Notes (Signed)
Subjective: 7 Days Post-Op Procedure(s) (LRB): OPEN REDUCTION INTERNAL FIXATION (ORIF)  RIGHT DISTAL RADIAL FRACTURE (Right) Patient reports pain as moderate.    Objective: Vital signs in last 24 hours: Temp:  [97.7 F (36.5 C)-98.4 F (36.9 C)] 98.4 F (36.9 C) (09/07 1500) Pulse Rate:  [66] 66 (09/07 1500) Resp:  [16-17] 16 (09/07 1500) BP: (139-151)/(71-72) 151/72 (09/07 1500) SpO2:  [97 %-98 %] 98 % (09/07 1500)  Intake/Output from previous day: 09/06 0701 - 09/07 0700 In: 720 [P.O.:720] Out: -  Intake/Output this shift: Total I/O In: 480 [P.O.:480] Out: -   No results for input(s): HGB in the last 72 hours. No results for input(s): WBC, RBC, HCT, PLT in the last 72 hours. No results for input(s): NA, K, CL, CO2, BUN, CREATININE, GLUCOSE, CALCIUM in the last 72 hours. No results for input(s): LABPT, INR in the last 72 hours.  intact sensation and capillary refill all digits.  +epl/fpl/io.  dressing c/d/i  Assessment/Plan: 7 Days Post-Op Procedure(s) (LRB): OPEN REDUCTION INTERNAL FIXATION (ORIF)  RIGHT DISTAL RADIAL FRACTURE (Right) Going home tomorrow.  Will plan to see in office beginning of next week for further post op care.  Jillian Morris R 04/30/2016, 4:46 PM

## 2016-04-30 NOTE — Plan of Care (Signed)
Problem: RH Floor Transfers Goal: LTG Patient will perform floor transfers w/assist (PT) LTG: Patient will perform floor transfers with assistance (PT).  Outcome: Not Applicable Date Met: 09/81/19 Patient NWB RUE and unable to perform due to L knee pain.

## 2016-04-30 NOTE — Progress Notes (Signed)
[facility]     PROGRESS NOTE    Subjective/Complaints: Had a good night. Feeling stronger. Happy to be going home  ROS: Pt denies fever, rash/itching, headache, blurred or double vision, nausea, vomiting, abdominal pain, diarrhea, chest pain, shortness of breath, palpitations, dysuria, dizziness, neck or back pain, bleeding, anxiety, or depression   Objective: Vital Signs: Blood pressure 139/71, pulse 66, temperature 97.7 F (36.5 C), temperature source Oral, resp. rate 17, height 5\' 4"  (1.626 m), weight 84.1 kg (185 lb 8 oz), SpO2 97 %. No results found. No results for input(s): WBC, HGB, HCT, PLT in the last 72 hours. No results for input(s): NA, K, CL, GLUCOSE, BUN, CREATININE, CALCIUM in the last 72 hours.  Invalid input(s): CO CBG (last 3)  No results for input(s): GLUCAP in the last 72 hours.  Wt Readings from Last 3 Encounters:  04/29/16 84.1 kg (185 lb 8 oz)  04/16/16 85.7 kg (188 lb 15 oz)  03/09/16 82.6 kg (182 lb)    Physical Exam:  Gen: NAD. Well-developed. Vital signs reviewed. HENT: no diplopia.  Bruising almost resolved on neck     Neck: Normal range of motion. Neck supple. No thyromegalypresent.  Cardiovascular: Normal rateand regular rhythm.  Respiratory: Effort normaland breath sounds normal. No respiratory distress.  GI: Soft. Bowel sounds are normal. She exhibits no distension.  Musculoskeletal:    B/l LE edema improved Neurological: She is alert. Tracks to all fields Oriented to person place and time. Follows commands. Improved insight and awareness.   Sensation intact to light touch. Motor: LUE: 4+/5 proximal to distal. RUE Limited by splint B/l LE: Hip flexion 4-/5, knee extension 4/5, ankle dorsi/plantar flexion 4+/5. Good sitting balance Skin: Skin is warmand dry  Assessment/Plan: 1. 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 Physiatrist is providing close team supervision and 24 hour management of active medical problems listed below. Physiatrist and rehab team continue to assess barriers to discharge/monitor patient progress toward functional and medical goals.  Function:  Bathing Bathing position   Position: Shower  Bathing parts Body parts bathed by patient: Right arm, Chest, Abdomen, Front perineal area, Buttocks, Right upper leg, Left upper leg, Right lower leg, Left lower leg Body parts bathed by helper: Back, Left arm  Bathing assist Assist Level: Touching or steadying assistance(Pt > 75%)      Upper Body Dressing/Undressing Upper body dressing   What is the patient wearing?: Button up shirt Bra - Perfomed by patient: Thread/unthread right bra strap, Thread/unthread left bra strap, Hook/unhook bra (pull down sports bra)   Pull over shirt/dress - Perfomed by patient: Thread/unthread right sleeve, Thread/unthread left sleeve, Put head through opening, Pull shirt over trunk   Button up shirt - Perfomed by patient: Thread/unthread right sleeve, Thread/unthread left sleeve, Pull shirt around back Button up shirt - Perfomed by helper: Button/unbutton shirt    Upper body assist Assist Level: Touching or steadying assistance(Pt > 75%)      Lower Body Dressing/Undressing Lower body dressing   What is the patient wearing?: Socks, Shoes Underwear - Performed by patient: Thread/unthread right underwear leg, Thread/unthread left underwear leg, Pull underwear up/down   Pants- Performed by patient: Thread/unthread right pants leg, Thread/unthread left pants leg, Pull pants up/down Pants- Performed by helper: Fasten/unfasten pants Non-skid slipper socks- Performed by patient: Don/doff right sock, Don/doff left sock   Socks - Performed by patient:  Don/doff right sock, Don/doff left sock Socks - Performed by helper: Don/doff right sock, Don/doff left sock (due to time  constraints only) Shoes - Performed by patient: Don/doff right shoe, Don/doff left shoe, Fasten right, Fasten left Shoes - Performed by helper: Fasten right, Fasten left          Lower body assist Assist for lower body dressing: Supervision or verbal cues      Toileting Toileting   Toileting steps completed by patient: Adjust clothing prior to toileting, Performs perineal hygiene, Adjust clothing after toileting   Toileting Assistive Devices: Grab bar or rail  Toileting assist Assist level: No help/no cues, More than reasonable time   Transfers Chair/bed transfer   Chair/bed transfer method: Ambulatory Chair/bed transfer assist level: Supervision or verbal cues Chair/bed transfer assistive device: Cane, Orthosis     Locomotion Ambulation     Max distance: 150 ft  Assist level: Supervision or verbal cues   Wheelchair Wheelchair activity did not occur: Safety/medical concerns        Cognition Comprehension Comprehension assist level: Understands complex 90% of the time/cues 10% of the time  Expression Expression assist level: Expresses complex ideas: With extra time/assistive device  Social Interaction Social Interaction assist level: Interacts appropriately with others with medication or extra time (anti-anxiety, antidepressant).  Problem Solving Problem solving assist level: Solves complex 90% of the time/cues < 10% of the time  Memory Memory assist level: Recognizes or recalls 90% of the time/requires cueing < 10% of the time   Medical Problem List and Plan: 1. TBI/SAH/right distal radius fracture, right maxillary right zygomatic arch fracture  secondary to fall 04/16/2016                       -continue CIR therapies  - dc 9/9 2.  DVT Prophylaxis/Anticoagulation: SCDs.   3. Pain Management/Fibromyalgia: MS Contin 30 mg every 12 hours, MSIR 30 mg every 6 hours as needed  -pain control much better 4. Mood: Cymbalta 60 mg twice a day, Ativan 1 mg daily at  bedtime--adjust to 9pm, Requip 1 mg daily at bedtime as needed, Xanax 0.25 mg twice daily as needed   -anxiety/MS  better  -needs continued reassurance    5. Neuropsych: This patient is capable of making decisions on her own behalf. 6. Skin/Wound Care: Routine skin checks   -may shower with right arm covered 7. Fluids/Electrolytes/Nutrition: continue to encourage PO 8. Right comminuted dorsally impacted fracture of the distal radius. No plan for surgical intervention at this time. Currently nonweightbearing.  -ORIF 04/23/16 per Dr. Merlyn LotKuzma   -RUE NVI 9. Right maxillary zygomatic arch fracture. Follow-up per Dr. Suszanne Connerseoh with conservative care   LOS (Days) 10 A FACE TO FACE EVALUATION WAS PERFORMED  SWARTZ,ZACHARY T 04/30/2016 8:13 AM

## 2016-05-01 MED ORDER — LAMOTRIGINE 100 MG PO TABS: 300.0000 mg | ORAL_TABLET | Freq: Every day | ORAL | 0 refills | Status: DC

## 2016-05-01 MED ORDER — DOCUSATE SODIUM 100 MG PO CAPS: 100.0000 mg | ORAL_CAPSULE | Freq: Two times a day (BID) | ORAL | 0 refills | Status: DC

## 2016-05-01 MED ORDER — DULOXETINE HCL 60 MG PO CPEP: 60.0000 mg | ORAL_CAPSULE | Freq: Two times a day (BID) | ORAL | 3 refills | Status: DC

## 2016-05-01 MED ORDER — METHOCARBAMOL 500 MG PO TABS: 500.0000 mg | ORAL_TABLET | Freq: Four times a day (QID) | ORAL | 0 refills | Status: DC | PRN

## 2016-05-01 MED ORDER — MORPHINE SULFATE 30 MG PO TABS: 30.0000 mg | ORAL_TABLET | Freq: Four times a day (QID) | ORAL | 0 refills | Status: DC | PRN

## 2016-05-01 MED ORDER — POLYETHYLENE GLYCOL 3350 17 G PO PACK: 17.0000 g | PACK | Freq: Every day | ORAL | 0 refills | Status: DC

## 2016-05-01 MED ORDER — LEVOTHYROXINE SODIUM 125 MCG PO TABS: 125.0000 ug | ORAL_TABLET | Freq: Every day | ORAL | 0 refills | Status: DC

## 2016-05-01 MED ORDER — ALPRAZOLAM 0.25 MG PO TABS: 0.2500 mg | ORAL_TABLET | Freq: Two times a day (BID) | ORAL | 0 refills | Status: DC | PRN

## 2016-05-01 MED ORDER — MORPHINE SULFATE ER 30 MG PO TBCR: 30.0000 mg | EXTENDED_RELEASE_TABLET | Freq: Two times a day (BID) | ORAL | 0 refills | Status: DC

## 2016-05-01 NOTE — Progress Notes (Signed)
[place] PHYSICAL MEDICINE & REHABILITATION     PROGRESS NOTE    Subjective/Complaints: Had a good night. Feeling stronger. Happy to be going home  ROS: Pt denies fever, rash/itching, headache, blurred or double vision, nausea, vomiting, abdominal pain, diarrhea, chest pain, shortness of breath, palpitations, dysuria, dizziness, neck or back pain, bleeding, anxiety, or depression   Objective: Vital Signs: Blood pressure (!) 152/71, pulse 70, temperature 98 F (36.7 C), temperature source Oral, resp. rate 18, height 5\' 4"  (1.626 m), weight 84.1 kg (185 lb 8 oz), SpO2 99 %. No results found. No results for input(s): WBC, HGB, HCT, PLT in the last 72 hours. No results for input(s): NA, K, CL, GLUCOSE, BUN, CREATININE, CALCIUM in the last 72 hours.  Invalid input(s): CO CBG (last 3)  No results for input(s): GLUCAP in the last 72 hours.  Wt Readings from Last 3 Encounters:  04/29/16 84.1 kg (185 lb 8 oz)  04/16/16 85.7 kg (188 lb 15 oz)  03/09/16 82.6 kg (182 lb)    Physical Exam:  Gen: NAD. Well-developed. Vital signs reviewed. HENT: no diplopia.  Bruising almost resolved on neck     Neck: Normal range of motion. Neck supple. No thyromegalypresent.  Cardiovascular: Normal rateand regular rhythm.  Respiratory: Effort normaland breath sounds normal. No respiratory distress.  GI: Soft. Bowel sounds are normal. She exhibits no distension.  Musculoskeletal:    B/l LE edema improved Neurological: She is alert. Tracks to all fields Oriented to person place and time. Follows commands. Improved insight and awareness.   Sensation intact to light touch. Motor: LUE: 4+/5 proximal to distal. RUE Limited by splint B/l LE: Hip flexion 4-/5, knee extension 4/5, ankle dorsi/plantar flexion 4+/5. Good sitting balance Skin: Skin is warmand dry  Assessment/Plan: 1. 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 Physiatrist is providing close team supervision and 24 hour management of active medical problems listed below. Physiatrist and rehab team continue to assess barriers to discharge/monitor patient progress toward functional and medical goals.  Function:  Bathing Bathing position   Position: Wheelchair/chair at sink  Bathing parts Body parts bathed by patient: Right arm, Chest, Abdomen, Front perineal area, Buttocks, Right upper leg, Left upper leg, Right lower leg, Left lower leg, Left arm Body parts bathed by helper: Back, Left arm  Bathing assist Assist Level: Supervision or verbal cues      Upper Body Dressing/Undressing Upper body dressing   What is the patient wearing?: Pull over shirt/dress Bra - Perfomed by patient: Thread/unthread right bra strap, Thread/unthread left bra strap, Hook/unhook bra (pull down sports bra)   Pull over shirt/dress - Perfomed by patient: Thread/unthread right sleeve, Thread/unthread left sleeve, Put head through opening, Pull shirt over trunk   Button up shirt - Perfomed by patient: Thread/unthread right sleeve, Thread/unthread left sleeve, Pull shirt around back Button up shirt - Perfomed by helper: Button/unbutton shirt    Upper body assist Assist Level: Touching or steadying assistance(Pt > 75%)   Set up : To obtain clothing/put away  Lower Body Dressing/Undressing Lower body dressing   What is the patient wearing?: Underwear, Pants, [employer]ed Hose, Shoes Underwear - Performed by patient: Thread/unthread right underwear leg, Thread/unthread left underwear leg, Pull underwear up/down   Pants- Performed by patient: Thread/unthread right pants leg, Thread/unthread left pants leg, Pull pants up/down Pants- Performed by helper: Fasten/unfasten pants Non-skid slipper socks- Performed by patient: Don/doff right  sock, Don/doff left sock   Socks - Performed by patient: Don/doff right sock, Don/doff left  sock Socks - Performed by helper: Don/doff right sock, Don/doff left sock (due to time constraints only) Shoes - Performed by patient: Don/doff right shoe, Don/doff left shoe, Fasten right, Fasten left Shoes - Performed by helper: Fasten right, Fasten left       TED Hose - Performed by helper: Don/doff right TED hose, Don/doff left TED hose  Lower body assist Assist for lower body dressing: Supervision or verbal cues      Toileting Toileting   Toileting steps completed by patient: Adjust clothing prior to toileting, Performs perineal hygiene, Adjust clothing after toileting   Toileting Assistive Devices: Grab bar or rail  Toileting assist Assist level: Supervision or verbal cues   Transfers Chair/bed transfer   Chair/bed transfer method: Ambulatory Chair/bed transfer assist level: No Help, no cues, assistive device, takes more than a reasonable amount of time Chair/bed transfer assistive device: Cane, Orthosis     Locomotion Ambulation     Max distance: 1000 ft Assist level: No help, No cues, assistive device, takes more than a reasonable amount of time   Wheelchair Wheelchair activity did not occur: Safety/medical concerns        Cognition Comprehension Comprehension assist level: Understands complex 90% of the time/cues 10% of the time  Expression Expression assist level: Expresses complex 90% of the time/cues < 10% of the time  Social Interaction Social Interaction assist level: Interacts appropriately with others with medication or extra time (anti-anxiety, antidepressant).  Problem Solving Problem solving assist level: Solves complex 90% of the time/cues < 10% of the time  Memory Memory assist level: Recognizes or recalls 90% of the time/requires cueing < 10% of the time   Medical Problem List and Plan: 1. TBI/SAH/right distal radius fracture, right maxillary right zygomatic arch fracture  secondary to fall 04/16/2016                       -continue CIR therapies  -  dc home today  -Patient to see me in the office for transitional care encounter in 1-2 weeks.  2.  DVT Prophylaxis/Anticoagulation: SCDs.   3. Pain Management/Fibromyalgia: MS Contin 30 mg every 12 hours, MSIR 30 mg every 6 hours as needed  -pain control  better 4. Mood: Cymbalta 60 mg twice a day, Ativan 1 mg daily at bedtime--adjust to 9pm, Requip 1 mg daily at bedtime as needed, Xanax 0.25 mg twice daily as needed   -anxiety/MS  better  -needs continued reassurance    5. Neuropsych: This patient is capable of making decisions on her own behalf. 6. Skin/Wound Care: Routine skin checks   -may shower with right arm covered 7. Fluids/Electrolytes/Nutrition: continue to encourage PO 8. Right comminuted dorsally impacted fracture of the distal radius. No plan for surgical intervention at this time. Currently nonweightbearing.  -ORIF 04/23/16 per Dr. Merlyn Lot   -outpt follow up 9. Right maxillary zygomatic arch fracture. Follow-up per Dr. Suszanne Conners with conservative care   LOS (Days) 11 A FACE TO FACE EVALUATION WAS PERFORMED  Jillian Morris 05/01/2016 8:28 AM

## 2016-05-01 NOTE — Discharge Instructions (Signed)
Inpatient Rehab Discharge Instructions  Jillian Morris Discharge date and time: No discharge date for patient encounter.   Activities/Precautions/ Functional Status: Activity: Nonweightbearing right upper extremity with shoulder sling Diet: Soft Wound Care: keep wound clean and dry Functional status:  ___ No restrictions     ___ Walk up steps independently ___ 24/7 supervision/assistance   ___ Walk up steps with assistance ___ Intermittent supervision/assistance  ___ Bathe/dress independently ___ Walk with walker     __x_ Bathe/dress with assistance ___ Walk Independently    ___ Shower independently ___ Walk with assistance    ___ Shower with assistance ___ No alcohol     ___ Return to work/school ________    COMMUNITY REFERRALS UPON DISCHARGE:    Outpatient: PT     ST                  Agency:  [facility]   Phone: [phone]               Appointment Date/Time:  05/08/16 @ 10:15 am (arrive @ 9:45) for PT                                                                  05/11/16 @ 11:00 for Speech  Medical Equipment/Items Ordered: tub bench                                                     Agency/Supplier:  [facility] @ [phone]   GENERAL COMMUNITY RESOURCES FOR PATIENT/FAMILY:  Support Groups:  Brain Injury Support Group (brochure)      Special Instructions:    My questions have been answered and I understand these instructions. I will adhere to these goals and the provided educational materials after my discharge from the hospital.  Patient/Caregiver Signature _______________________________ Date __________  Clinician Signature _______________________________________ Date __________  Please bring this form and your medication list with you to all your follow-up doctor's appointments.

## 2016-05-01 NOTE — Discharge Summary (Signed)
NAMBrayton Morris:  [place], [place]              ACCOUNT NO.:  [id number]  MEDICAL RECORD NO.:  [id number]  LOCATION:  4M05C                        FACILITY:  [facility]  PHYSICIAN:  Ranelle OysterZachary T. Swartz, M.D.DATE OF BIRTH:  [birth year]  DATE OF ADMISSION:  04/20/2016 DATE OF DISCHARGE:  05/01/2016                              DISCHARGE SUMMARY   DISCHARGE DIAGNOSES: 1. Traumatic brain injury with subarachnoid hemorrhage as well as     right distal radius fracture, right maxillary, right zygomatic arch     fracture secondary to fall, April 16, 2016. 2. SCDs for deep venous thrombosis prophylaxis. 3. Pain management with fibromyalgia. 4. Depression. 5. Right comminuted dorsally-impacted fracture of the distal radius. 6. Right maxillary zygomatic arch fracture.  HISTORY OF PRESENT ILLNESS:  This is a 70 year old right-handed female with history of fibromyalgia, chronic fatigue syndrome and chronic low back pain.  Lives with spouse, independent with a cane prior to admission.  One-level home.  Presented on April 16, 2016, after ground- level fall in the hallway of her ophthalmologist office.  CT of the head showed subarachnoid hemorrhage along the medial right frontal lobe and left side of the quadrigeminal plate.  Complex fracture, lateral inferior wall of the right orbit.  Fractures to the anterior, posterior, superior, inferior and lateral walls of the right maxillary sinus and right zygomatic arch fracture.  CT of cervical spine negative.  Noted also findings of right comminuted dorsally-impacted fracture of the distal radius with dorsal angulation of the distal radial articular surface.  Dr. Venetia MaxonStern, Neurosurgery consulted, [facility]  Followup CT negative.  Dr. Merlyn LotKuzma followup for right dorsally- impacted fracture of distal radius, awaiting surgical intervention. Physical and occupational therapy ongoing.  The patient was admitted for comprehensive rehab program.  PAST  MEDICAL HISTORY:  See discharge diagnoses.  SOCIAL HISTORY:  Lives with spouse, used a cane prior to admission.  FUNCTIONAL STATUS:  Upon admission to Rehab Services was moderate assist, 90 feet, one-person handheld assistance; minimal assist, sit to stand; min-to-mod assist, activities of daily living.  PHYSICAL EXAMINATION:  VITAL SIGNS:  Blood pressure 125/65, pulse 84, temperature 97, respirations 18. GENERAL:  This was an alert female, somewhat anxious, multiple bruising to the face, injected right sclera. LUNGS:  Clear to auscultation without wheeze. CARDIAC:  Regular rate and rhythm.  No murmur. ABDOMEN:  Soft, nontender.  Good bowel sounds.  REHABILITATION HOSPITAL COURSE:  The patient was admitted to Inpatient Rehab Services with therapies initiated on a 3-hour daily basis, consisting of physical therapy, occupational therapy and rehabilitation nursing.  The following issues were addressed during the patient's rehabilitation stay.  Pertaining to Ms. Cantin's right maxillary, right zygomatic arch fracture remained stable, she would follow up with Dr. Suszanne Connerseoh with conservative care.  Noted SAH, TBI related to fall.  Latest CT scan stable, conservative care by Dr. Maeola HarmanJoseph Stern.  Right comminuted dorsally-impacted fracture of the distal radius.  She had undergone ORIF on April 23, 2016, currently nonweightbearing.  She would follow up with Dr. Merlyn LotKuzma.  Neurovascular sensation intact.  The patient with long history of fibromyalgia, chronic back pain, maintained on MS Contin as well as MSIR for breakthrough pain and good  results.  Mood, depression with anxiety, using Ativan as well as ReQuip as needed and occasional Xanax.  The patient was encouraged with her overall progress.  Blood pressure is well controlled.  Bowel program was adjusted.  The patient received weekly collaborative interdisciplinary team conferences to discuss estimated length of stay, family teaching, any barriers  to discharge.  Dynamic standing balance was challenged.  She was ambulating with assistive device from her room to therapies.  The patient able to demonstrate increased gait speed, perform strengthening and muscular endurance.  She was limited at times by fatigue, undergoing energy conservation techniques.  She could ambulate with a straight-point cane, while caring a full cup of water, up and down stairs, supervision overall, no loss of balance.  Gather belongings for activities of daily living and home making, needing some assistance due to radius fracture, nonweightbearing.  Full family teaching was completed and plan discharge to home.  DISCHARGE MEDICATIONS: 1. Vitamin B12 1000 mcg every 30 days. 2. Colace 100 mg p.o. b.i.d. 3. Cymbalta 60 mg p.o. b.i.d. 4. Pepcid 20 mg p.o. daily. 5. Lamictal 300 mg p.o. at bedtime. 6. Synthroid 125 mcg p.o. daily. 7. Ativan 1 mg p.o. at bedtime. 8. MS Contin 30 mg p.o. every 12 hours. 9. MiraLax daily. 10.Xanax 0.25 mg p.o. b.i.d. as needed. 11.Hydrocodone two tablets every 4 hours as needed. 12.Robaxin 500 mg p.o. every 6 hours as needed. 13.MSIR 30 mg every 6 hours as needed. 14.ReQuip 1 mg p.o. at bedtime as needed.  DIET:  Her was soft, nonweightbearing right upper extremity.  FOLLOWUP:  She would follow up with Dr. Faith Rogue at the Outpatient Rehab Service Office as directed.  Follow up with Maeola Harman, call for appointment; Dr. Merlyn Lot, call for appointment; Dr. Johny Blamer, medical management.  In regard to her MS Contin and MSIR, she received low-dose prescriptions.     Mariam Dollar, P.A.   ______________________________ Ranelle Oyster, M.D.    DA/MEDQ  D:  04/30/2016  T:  05/01/2016  Job:  [id number]  cc:   Betha Loa, MD Danae Orleans. Venetia Maxon, M.D. Melida Quitter, M.D.

## 2016-05-01 NOTE — Progress Notes (Signed)
Patient discharged to home accompanied by her husband. 

## 2016-05-01 NOTE — Progress Notes (Signed)
Social Work  Discharge Note  The overall goal for the admission was met for:   Discharge location: Yes - home with spouse who is able to provide 24/7 assistance  Length of Stay: Yes - 11 days  Discharge activity level: Yes - supervision  Home/community participation: Yes  Services provided included: MD, RD, PT, OT, SLP, RN, TR, Pharmacy, Neuropsych and SW  Financial Services: Medicare  Follow-up services arranged: Outpatient: PT, ST via [facility], DME: tub bench via [facility] and Patient/Family has no preference for HH/DME agencies  Comments (or additional information): Provide pt and spouse with contact information for Dr. Marlane Hatcher for neuropsych follow up as needed.  Also, information on local TBI Support Group  Patient/Family verbalized understanding of follow-up arrangements: Yes  Individual responsible for coordination of the follow-up plan: pt  Confirmed correct DME delivered: Wrenley Sayed 05/01/2016    Zykira Matlack

## 2016-05-01 NOTE — Progress Notes (Signed)
Speech Language Pathology Discharge Summary  Patient Details  Name: Jillian Morris MRN: [id number] Date of Birth: 28-Jun-1946  Patient has met 4 of 4 long term goals.  Patient to discharge at overall Supervision level.   Reasons goals not met: N/A   Clinical Impression/Discharge Summary: Patient has made functional gains and has met 4 of 4 LTG's this admission due to improved cognitive functioning. Currently, patient demonstrates behaviors consistent with a Rancho Level VIII and requires overall supervision to complete mildly complex and functional tasks safely in regards to problem solving, recall, emergent awareness and attention. Patient and family education is complete and patient will discharge home with 24 hour supervision from family. Patient would benefit from skilled SLP f/u to maximize her cognitive function and overall functional independence in order to reduce caregiver burden.   Care Partner:  Caregiver Able to Provide Assistance: Yes  Type of Caregiver Assistance: Physical;Cognitive  Recommendation:  24 hour supervision/assistance;Home Health or Outpatient SLP  Rationale for SLP Follow Up: Reduce caregiver burden;Maximize cognitive function and independence   Equipment: N/A   Reasons for discharge: Treatment goals met;Discharged from hospital   Patient/Family Agrees with Progress Made and Goals Achieved: Yes       [place], [place] 05/01/2016, 7:01 AM

## 2016-05-08 ENCOUNTER — Ambulatory Visit: Payer: Medicare Other | Admitting: Physical Therapy

## 2016-05-11 ENCOUNTER — Ambulatory Visit: Payer: Medicare Other | Attending: Physical Medicine & Rehabilitation | Admitting: *Deleted

## 2016-05-11 DIAGNOSIS — R2689 Other abnormalities of gait and mobility: Secondary | ICD-10-CM | POA: Diagnosis present

## 2016-05-11 DIAGNOSIS — R41841 Cognitive communication deficit: Secondary | ICD-10-CM | POA: Diagnosis not present

## 2016-05-11 DIAGNOSIS — R2681 Unsteadiness on feet: Secondary | ICD-10-CM | POA: Diagnosis present

## 2016-05-11 DIAGNOSIS — M6281 Muscle weakness (generalized): Secondary | ICD-10-CM | POA: Insufficient documentation

## 2016-05-11 NOTE — Therapy (Signed)
[facility] [address] [place], [place], [address] Phone: [phone]   Fax:  [phone]  Speech Language Pathology Evaluation  Patient Details  Name: Jillian Morris MRN: [id number] Date of Birth: 07/07/1946 Referring Provider: Dr. Riley KillSwartz  Encounter Date: 05/11/2016      End of Session - 05/11/16 1419    Visit Number 1   Number of Visits 1   Authorization - Visit Number 1   Authorization - Number of Visits 1   SLP Start Time 1100   SLP Stop Time  1150   SLP Time Calculation (min) 50 min   Activity Tolerance Patient tolerated treatment well      Past Medical History:  Diagnosis Date  . Acne rosacea   . Anemia    s/p gastric bypass, malabsorption  . Anxiety   . Arthritis   . B12 deficiency   . Chronic fatigue fibromyalgia syndrome   . Chronic low back pain   . Depression   . Fibromyalgia   . GERD (gastroesophageal reflux disease)   . Graves disease   . H/O hiatal hernia   . Hypothyroidism   . IBS (irritable bowel syndrome)   . Lymphedema   . Meralgia paraesthetica   . Sjogren's syndrome (HCC)   . Status post dilation of esophageal narrowing   . Stroke [facility](HCC)     Past Surgical History:  Procedure Laterality Date  . ABDOMINAL HYSTERECTOMY    . APPENDECTOMY    . BALLOON DILATION  03/30/2012   Procedure: BALLOON DILATION;  Surgeon: Louis Meckelobert D Kaplan, MD;  [facility]: [facility];  Service: Endoscopy;  Laterality: N/A;  . BOTOX INJECTION N/A 12/26/2014   Procedure: BOTOX INJECTION;  Surgeon: Louis Meckelobert D Kaplan, MD;  [facility];  Service: Endoscopy;  Laterality: N/A;  . ENDOMETRIAL ABLATION     x 4  . ESOPHAGEAL MANOMETRY N/A 05/15/2013   Procedure: ESOPHAGEAL MANOMETRY (EM);  Surgeon: Louis Meckelobert D Kaplan, MD;  [facility];  Service: Endoscopy;  Laterality: N/A;  . ESOPHAGOGASTRODUODENOSCOPY  03/30/2012   Procedure: ESOPHAGOGASTRODUODENOSCOPY (EGD);  Surgeon: Louis Meckelobert D Kaplan, MD;  [facility];  Service: Endoscopy;  Laterality: N/A;  . ESOPHAGOGASTRODUODENOSCOPY  05/04/2012   Procedure: ESOPHAGOGASTRODUODENOSCOPY (EGD);  Surgeon: Louis Meckelobert D Kaplan, MD;  [facility];  Service: Endoscopy;  Laterality: N/A;  . ESOPHAGOGASTRODUODENOSCOPY N/A 12/26/2014   Procedure: ESOPHAGOGASTRODUODENOSCOPY (EGD);  Surgeon: Louis Meckelobert D Kaplan, MD;  [facility];  Service: Endoscopy;  Laterality: N/A;  botox injection  . GASTRIC BYPASS    . LAMINECTOMY     L3-L5  . OPEN REDUCTION INTERNAL FIXATION (ORIF) DISTAL RADIAL FRACTURE Right 04/23/2016   Procedure: OPEN REDUCTION INTERNAL FIXATION (ORIF)  RIGHT DISTAL RADIAL FRACTURE;  Surgeon: Betha LoaKevin Kuzma, MD;  [facility];  Service: Orthopedics;  Laterality: Right;  . ORIF TRIPOD FRACTURE Right 04/19/2016   Procedure: OPEN REDUCTION INTERNAL FIXATION (ORIF) TRIPOD FRACTURE LIEBINGER MIDFACE NO DRILL;  Surgeon: Newman PiesSu Teoh, MD;  [facility];  Service: ENT;  Laterality: Right;  . 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 THYROIDECTOMY      There were no vitals filed for this visit.      Subjective Assessment - 05/11/16 1122    Subjective I think I'm doing pretty well.    Patient is accompained by: Family member   Pain Score 3    Pain Location Back   Pain Orientation Right   Pain Type Chronic pain   Pain Score  3            SLP Evaluation [facility] - May 25, 2016 1122      SLP Visit Information   SLP Received On May 25, 2016   Referring Provider Dr. Riley Kill   Onset Date 04/16/16   Medical Diagnosis SAH     Subjective   Patient/Family Stated Goal "To feel confident"     General Information   HPI Patient with a ground level fall in the hallway of her ophthalmologist office.  Brief LOC of minutes. Pt with R distal radius fx and oribital fx. CT on 8/25 + for stable small volume subarachnoid hemorrhage in the parafalcine right frontal lobe and left quadrigeminal plate cistern. PMHx: Anxiety, Arthritis, Chronic lower back pain,  Depression, Fibromyalgia, GERD, Graves disease, IBS, Sjogren's syndrome, Stroke.    Behavioral/Cognition pleasant and hardworking   Mobility Status mod I with cane     Prior Functional Status   Cognitive/Linguistic Baseline Within functional limits   Type of Home House    Lives With Spouse   Vocation Retired     IT consultant   Overall Cognitive Status Within Functional Limits for tasks assessed     Auditory Comprehension   Overall Auditory Comprehension Appears within functional limits for tasks assessed     Verbal Expression   Overall Verbal Expression Appears within functional limits for tasks assessed     Written Expression   Dominant Hand Right   Written Expression Within Functional Limits  with L hand     Oral Motor/Sensory Function   Overall Oral Motor/Sensory Function Appears within functional limits for tasks assessed     Motor Speech   Overall Motor Speech Appears within functional limits for tasks assessed   Articulation Within functional limitis     Standardized Assessments   Standardized Assessments  Montreal Cognitive Assessment (MOCA)   Montreal Cognitive Assessment (MOCA)  30/30     Assessment   Clinical Impression Statement (ACUTE ONLY) Pt performed WNL in all cognitive-linguistic areas assessed. In addition to the [facility], several informal measures were taken to investigate performance with higher level attention , reasoning and recall. Pt and spouse agree that pt has returned to previous function. No further SLP services indicated at this time.    SLP Recommendation/Assessment Patient does not need any further Speech Lanaguage Pathology Services   No Skilled Speech Therapy Patient at baseline level of functioning     SLP Recommendations   Follow up Recommendations None   SLP Equipment None recommended by SLP     Individuals Consulted   Consulted and Agree with Results and Recommendations Patient;Family member/caregiver   Family Member Consulted  spouse                          SLP Education - 2016-05-25 1419    Education provided Yes   Education Details results/recommendations following assessment   Person(s) Educated Spouse;Patient   Methods Explanation   Comprehension Verbalized understanding              Plan - 2016/05/25 1420    Clinical Impression Statement See previous note. WNL.   Consulted and Agree with Plan of Care Family member/caregiver;Patient   Family Member Consulted spouse      Patient will benefit from skilled therapeutic intervention in order to improve the following deficits and impairments:   Cognitive communication deficit      G-Codes - 05/25/16 1422    Functional Assessment Tool Used skilled clinical judgment, ASHA NOMS  Functional Limitations Memory   Memory Current Status [phone]) 0 percent impaired, limited or restricted   Memory Goal Status (X9147) 0 percent impaired, limited or restricted   Memory Discharge Status (G9170) 0 percent impaired, limited or restricted      Problem List Patient Active Problem List   Diagnosis Date Noted  . Fall 04/20/2016  . Multiple facial fractures (HCC) 04/20/2016  . Right wrist fracture 04/20/2016  . Closed fracture of right distal radius   . Closed fracture of zygomatic arch (HCC)   . Depression   . Fibromyalgia   . Traumatic subarachnoid bleed with LOC of 1 hour to 5 hours 59 minutes (HCC) 04/16/2016  . Achalasia 08/09/2013  . Stricture and stenosis of esophagus 03/30/2012  . Dysphagia, unspecified(787.20) 03/21/2012  . Special screening for malignant neoplasms, colon 03/21/2012  . Esophageal reflux 03/21/2012    Rocky Crafts MA, CCC-SLP 05/11/2016, 2:26 PM   [facility] [address] [place], [place], [address] Phone: [phone]   Fax:  [phone]  Name: Jillian Morris MRN: [id number] Date of Birth: [birth year]

## 2016-05-13 ENCOUNTER — Ambulatory Visit: Payer: Medicare Other | Admitting: Physical Therapy

## 2016-05-13 ENCOUNTER — Encounter: Payer: Self-pay | Admitting: Physical Therapy

## 2016-05-13 DIAGNOSIS — R2681 Unsteadiness on feet: Secondary | ICD-10-CM

## 2016-05-13 DIAGNOSIS — R2689 Other abnormalities of gait and mobility: Secondary | ICD-10-CM

## 2016-05-13 DIAGNOSIS — R41841 Cognitive communication deficit: Secondary | ICD-10-CM | POA: Diagnosis not present

## 2016-05-13 DIAGNOSIS — M6281 Muscle weakness (generalized): Secondary | ICD-10-CM

## 2016-05-13 NOTE — Therapy (Signed)
[facility] [address] [place], [place], [address] Phone: [phone]   Fax:  [phone]  Physical Therapy Evaluation  Patient Details  Name: Jillian Morris MRN: [id number] Date of Birth: [birth year] Referring Provider: Faith Rogue, MD  Encounter Date: 05/13/2016      PT End of Session - 05/13/16 1152    Visit Number 1   Number of Visits 9   Date for PT Re-Evaluation 06/12/16   Authorization Type [employer] Medicare   Authorization Time Period G Codes required   PT Start Time 1030   PT Stop Time 1141   PT Time Calculation (min) 71 min   Equipment Utilized During Treatment Gait belt   Activity Tolerance Patient tolerated treatment well   Behavior During Therapy WFL for tasks assessed/performed      Past Medical History:  Diagnosis Date  . Acne rosacea   . Anemia    s/p gastric bypass, malabsorption  . Anxiety   . Arthritis   . B12 deficiency   . Chronic fatigue fibromyalgia syndrome   . Chronic low back pain   . Depression   . Fibromyalgia   . GERD (gastroesophageal reflux disease)   . Graves disease   . H/O hiatal hernia   . Hypothyroidism   . IBS (irritable bowel syndrome)   . Lymphedema   . Meralgia paraesthetica   . Sjogren's syndrome (HCC)   . Status post dilation of esophageal narrowing   . Stroke [facility])     Past Surgical History:  Procedure Laterality Date  . ABDOMINAL HYSTERECTOMY    . APPENDECTOMY    . BALLOON DILATION  03/30/2012   Procedure: BALLOON DILATION;  Surgeon: Louis Meckel, MD;  [facility];  Service: Endoscopy;  Laterality: N/A;  . BOTOX INJECTION N/A 12/26/2014   Procedure: BOTOX INJECTION;  Surgeon: Louis Meckel, MD;  [facility];  Service: Endoscopy;  Laterality: N/A;  . ENDOMETRIAL ABLATION     x 4  . ESOPHAGEAL MANOMETRY N/A 05/15/2013   Procedure: ESOPHAGEAL MANOMETRY (EM);  Surgeon: Louis Meckel, MD;  [facility];  Service:  Endoscopy;  Laterality: N/A;  . ESOPHAGOGASTRODUODENOSCOPY  03/30/2012   Procedure: ESOPHAGOGASTRODUODENOSCOPY (EGD);  Surgeon: Louis Meckel, MD;  [facility];  Service: Endoscopy;  Laterality: N/A;  . ESOPHAGOGASTRODUODENOSCOPY  05/04/2012   Procedure: ESOPHAGOGASTRODUODENOSCOPY (EGD);  Surgeon: Louis Meckel, MD;  [facility];  Service: Endoscopy;  Laterality: N/A;  . ESOPHAGOGASTRODUODENOSCOPY N/A 12/26/2014   Procedure: ESOPHAGOGASTRODUODENOSCOPY (EGD);  Surgeon: Louis Meckel, MD;  [facility];  Service: Endoscopy;  Laterality: N/A;  botox injection  . GASTRIC BYPASS    . LAMINECTOMY     L3-L5  . OPEN REDUCTION INTERNAL FIXATION (ORIF) DISTAL RADIAL FRACTURE Right 04/23/2016   Procedure: OPEN REDUCTION INTERNAL FIXATION (ORIF)  RIGHT DISTAL RADIAL FRACTURE;  Surgeon: Betha Loa, MD;  [facility];  Service: Orthopedics;  Laterality: Right;  . ORIF TRIPOD FRACTURE Right 04/19/2016   Procedure: OPEN REDUCTION INTERNAL FIXATION (ORIF) TRIPOD FRACTURE LIEBINGER MIDFACE NO DRILL;  Surgeon: Newman Pies, MD;  [facility];  Service: ENT;  Laterality: Right;  . 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 THYROIDECTOMY      There were no vitals filed for this visit.       Subjective Assessment - 05/13/16 1033    Subjective Pt reports feeling like she was about to fall while at her opthamologist office.  She also  reports not remembering "much of anyting" about the accident.   Patient is accompained by: Family member  Gala Romney - husband   Limitations Walking;House hold activities;Sitting;Standing   How long can you stand comfortably? <20min   How long can you walk comfortably? <1034ft   Patient Stated Goals "I want to improve my balance and incr the speed of my walking."   Currently in Pain? No/denies            [facility] PT Assessment - 05/13/16 1015      Assessment   Medical Diagnosis TBI   Referring Provider Faith Rogue, MD   Onset Date/Surgical  Date 04/16/16   Hand Dominance Right     Precautions   Precautions Other (comment)   Precaution Comments NWB on R hand     Balance Screen   Has the patient fallen in the past 6 months Yes   How many times? 1   Has the patient had a decrease in activity level because of a fear of falling?  Yes   Is the patient reluctant to leave their home because of a fear of falling?  No     Home Tourist information centre manager residence   Living Arrangements Spouse/significant other   Available Help at Discharge Family   Type of Home House   Home Access Stairs to enter   Entrance Stairs-Number of Steps 8   Entrance Stairs-Rails Right;Left;Cannot reach both   Home Layout One level   Home Equipment [place] - single point;Tub bench     Prior Function   Level of Independence Requires assistive device for independence   Vocation Retired     ROM / Strength   AROM / PROM / Strength AROM;Strength     AROM   Overall AROM  Within functional limits for tasks performed     Strength   Overall Strength Deficits   Overall Strength Comments B shoulder flexion 3+/5.   Strength Assessment Site Hip;Knee;Ankle   Right/Left Hip Right;Left   Right Hip Flexion 3/5   Left Hip Flexion 3-/5   Right/Left Knee Right;Left   Right Knee Flexion 3+/5   Right Knee Extension 4-/5   Left Knee Flexion 3+/5   Left Knee Extension 4-/5   Right/Left Ankle Right;Left   Right Ankle Dorsiflexion 3+/5   Left Ankle Dorsiflexion 3+/5     Transfers   Transfers Sit to Stand;Stand to Sit;Stand Pivot Transfers   Sit to Stand 5: Supervision   Stand to Sit 5: Supervision   Stand Pivot Transfers 5: Supervision     Ambulation/Gait   Ambulation/Gait Yes   Ambulation/Gait Assistance 5: Supervision   Ambulation/Gait Assistance Details Gait performed during FGA    Ambulation Distance (Feet) 100 Feet  Multiple bouts for FGA   Assistive device None   Gait Pattern Step-through pattern;Decreased arm swing - right;Decreased  arm swing - left;Decreased step length - right;Decreased step length - left;Decreased stance time - right;Decreased stance time - left;Decreased hip/knee flexion - right;Decreased hip/knee flexion - left;Shuffle;Trunk flexed;Wide base of support   Ambulation Surface Level;Indoor   Gait velocity 2.3 ft/sec     Standardized Balance Assessment   Standardized Balance Assessment Berg Balance Test     Berg Balance Test   Sit to Stand Able to stand without using hands and stabilize independently   Standing Unsupported Able to stand safely 2 minutes   Sitting with Back Unsupported but Feet Supported on Floor or Stool Able to sit safely and securely 2 minutes  Stand to Sit Sits safely with minimal use of hands   Transfers Able to transfer safely, minor use of hands   Standing Unsupported with Eyes Closed Able to stand 10 seconds safely   Standing Ubsupported with Feet Together Able to place feet together independently and stand 1 minute safely   From Standing, Reach Forward with Outstretched Arm Can reach confidently >25 cm (10")   From Standing Position, Pick up Object from Floor Able to pick up shoe safely and easily   From Standing Position, Turn to Look Behind Over each Shoulder Looks behind from both sides and weight shifts well   Turn 360 Degrees Able to turn 360 degrees safely one side only in 4 seconds or less  4.5 sec to R side   Standing Unsupported, Alternately Place Feet on Step/Stool Able to complete 4 steps without aid or supervision   Standing Unsupported, One Foot in Front Able to plae foot ahead of the other independently and hold 30 seconds  required (S) for tandem; independent with semi tandem   Standing on One Leg Able to lift leg independently and hold > 10 seconds   Total Score 52     Functional Gait  Assessment   Gait assessed  Yes   Gait Level Surface Walks 20 ft, slow speed, abnormal gait pattern, evidence for imbalance or deviates 10-15 in outside of the 12 in walkway  width. Requires more than 7 sec to ambulate 20 ft.  > 7 sec   Change in Gait Speed Able to change speed, demonstrates mild gait deviations, deviates 6-10 in outside of the 12 in walkway width, or no gait deviations, unable to achieve a major change in velocity, or uses a change in velocity, or uses an assistive device.   Gait with Horizontal Head Turns Performs head turns smoothly with slight change in gait velocity (eg, minor disruption to smooth gait path), deviates 6-10 in outside 12 in walkway width, or uses an assistive device.   Gait with Vertical Head Turns Performs head turns with no change in gait. Deviates no more than 6 in outside 12 in walkway width.  no significant change in gait pattern compared to baseline   Gait and Pivot Turn Pivot turns safely in greater than 3 sec and stops with no loss of balance, or pivot turns safely within 3 sec and stops with mild imbalance, requires small steps to catch balance.   Step Over Obstacle Is able to step over one shoe box (4.5 in total height) without changing gait speed. No evidence of imbalance.   Gait with Narrow Base of Support Ambulates less than 4 steps heel to toe or cannot perform without assistance.   Gait with Eyes Closed Walks 20 ft, slow speed, abnormal gait pattern, evidence for imbalance, deviates 10-15 in outside 12 in walkway width. Requires more than 9 sec to ambulate 20 ft.  deviates > 15" and requires > 9 sec   Ambulating Backwards Walks 20 ft, slow speed, abnormal gait pattern, evidence for imbalance, deviates 10-15 in outside 12 in walkway width.  24.02 sec   Steps Two feet to a stair, must use rail.  reciprocal until final 2 steps   Total Score 15   FGA comment: Indicates high fall risk                           PT Education - 05/13/16 1150    Education provided Yes   Education Details  Pt educated to continue using SPC when out in the community and education on how PMH effects POC.   Person(s)  Educated Patient;Spouse   Methods Explanation   Comprehension Verbalized understanding          PT Short Term Goals - 05/13/16 1647      PT SHORT TERM GOAL #1   Title STGs = LTGs           PT Long Term Goals - 05/13/16 1646      PT LONG TERM GOAL #1   Title Pt will be independent and verbalize understanding of initial HEP and on-going fitness plan. (Target Date: 06/12/16)   Time 4   Period Weeks   Status New     PT LONG TERM GOAL #2   Title Pt will improve FGA score to >22/30 to indicated decr risk of falling.   Time 4   Period Weeks   Status New     PT LONG TERM GOAL #3   Title Pt will incr gait speed to > 2.62 ft/sec in order for pt to be considered a community ambulator.   Time 4   Period Weeks   Status New     PT LONG TERM GOAL #4   Title Pt will ambulate 1048ft outdoors with LRAD and mod I while negotiating hills, pavement, grass, gravel, and curbs to improve ability to ambulate in the community.   Time 4   Period Weeks   Status New     PT LONG TERM GOAL #5   Title Pt will report no more than 2 increments of an incr in pain on the NPRS when perorming HEP and activities in therapy sessions.   Time 4   Period Weeks   Status New               Plan - 05/13/16 1157    Clinical Impression Statement Pt is a 70 year old femaile who presents to PT with a diagnosis of TBI with subarachnoid hemmorhage (04/16/16) with hospitalization from 04/16/16-05/01/16 including inpatient rehabilitation.  PMH is significant for fibromyalgia, chronic LBP, OA, R knee arthroplasty, lymphedema, and CVA.  Pt's static balance seems to be intact as evidence by a Berg Balance score of 52/56.  However, impairments were noted in pt's functional gait with a FGA score of 15/30 indicating a high fall risk.  Gait speed was measured at 2.31ft/sec indicating she is a limited community ambulator.  Pt also demonstrates decr strength in B LE that could potentially be contributing to her balance  deficits as well as chronic low back pain that will not be directly addressed during therapy but will be monitored.  Activity tolerance is also impaired and limited due to pain and sedentary lifestyle since TBI.  Pt's condition appears to be evolving and of moderate complexity.  She would benefit from skilled PT to address her deficits and return to prior level of function.   Rehab Potential Good   Clinical Impairments Affecting Rehab Potential OA, R knee arthroplasty, fibromyalgia, chronic LBP, lymphedema   PT Frequency 2x / week   PT Duration 4 weeks   PT Treatment/Interventions ADLs/Self Care Home Management;Functional mobility training;Stair training;Gait training;DME Instruction;Therapeutic activities;Therapeutic exercise;Balance training;Neuromuscular re-education;Patient/family education;Manual techniques;Energy conservation;Vestibular   PT Next Visit Plan Initiate HEP, introduce falls prevention strategies, LE and core strengthening, balance activities without vision    Consulted and Agree with Plan of Care Patient;Family member/caregiver   Family Member Consulted Husband      Patient will  benefit from skilled therapeutic intervention in order to improve the following deficits and impairments:  Abnormal gait, Decreased activity tolerance, Decreased balance, Decreased mobility, Decreased knowledge of use of DME, Decreased endurance, Decreased strength, Improper body mechanics, Pain  Visit Diagnosis: Other abnormalities of gait and mobility  Unsteadiness on feet  Muscle weakness (generalized)      G-Codes - May 26, 2016 1715    Functional Assessment Tool Used FGA = 15/30   Functional Limitation Mobility: Walking and moving around   Mobility: Walking and Moving Around Current Status [phone]) At least 40 percent but less than 60 percent impaired, limited or restricted   Mobility: Walking and Moving Around Goal Status [phone]) At least 20 percent but less than 40 percent impaired, limited or  restricted       Problem List Patient Active Problem List   Diagnosis Date Noted  . Fall 04/20/2016  . Multiple facial fractures (HCC) 04/20/2016  . Right wrist fracture 04/20/2016  . Closed fracture of right distal radius   . Closed fracture of zygomatic arch (HCC)   . Depression   . Fibromyalgia   . Traumatic subarachnoid bleed with LOC of 1 hour to 5 hours 59 minutes (HCC) 04/16/2016  . Achalasia 08/09/2013  . Stricture and stenosis of esophagus 03/30/2012  . Dysphagia, unspecified(787.20) 03/21/2012  . Special screening for malignant neoplasms, colon 03/21/2012  . Esophageal reflux 03/21/2012    Vilinda Flake, SPT 05/26/2016, 5:19 PM  [facility] [address] [place], [place], [address] Phone: [phone]   Fax:  [phone]  Name: Jillian Morris MRN: [id number] Date of Birth: Aug 12, 1946

## 2016-05-14 ENCOUNTER — Encounter: Payer: Medicare Other | Admitting: Physical Medicine & Rehabilitation

## 2016-05-14 DIAGNOSIS — M25531 Pain in right wrist: Secondary | ICD-10-CM | POA: Insufficient documentation

## 2016-05-18 ENCOUNTER — Ambulatory Visit: Payer: Medicare Other | Admitting: Occupational Therapy

## 2016-09-09 ENCOUNTER — Ambulatory Visit: Payer: Medicare Other | Admitting: Neurology

## 2016-10-07 ENCOUNTER — Other Ambulatory Visit: Payer: Self-pay | Admitting: Family Medicine

## 2016-10-07 DIAGNOSIS — R42 Dizziness and giddiness: Secondary | ICD-10-CM

## 2016-10-13 ENCOUNTER — Ambulatory Visit
Admission: RE | Admit: 2016-10-13 | Discharge: 2016-10-13 | Disposition: A | Discharge status: Home / Self Care | Payer: Medicare Other | Source: Ambulatory Visit | Attending: Family Medicine | Admitting: Family Medicine

## 2016-10-13 DIAGNOSIS — R42 Dizziness and giddiness: Secondary | ICD-10-CM

## 2016-11-17 ENCOUNTER — Other Ambulatory Visit: Payer: Self-pay

## 2016-11-17 ENCOUNTER — Ambulatory Visit: Payer: Medicare Other | Admitting: Neurology

## 2016-11-19 ENCOUNTER — Encounter: Payer: Self-pay | Admitting: Neurology

## 2017-02-04 ENCOUNTER — Ambulatory Visit: Payer: Medicare Other | Admitting: Neurology

## 2017-02-08 ENCOUNTER — Telehealth: Payer: Self-pay | Admitting: Rheumatology

## 2017-02-08 NOTE — Telephone Encounter (Signed)
Patient called and is wanting to get Dr. Corliss Skainseveshwar to send a referral for an orthopedic doctor.  She [facility]  CB#[phone].  Thank you.

## 2017-02-09 NOTE — Telephone Encounter (Signed)
Patient called again to check on status of referral  recommendation for an Orthopedic. Please call to advise.

## 2017-02-10 ENCOUNTER — Encounter: Payer: Self-pay | Admitting: Neurology

## 2017-02-10 NOTE — Telephone Encounter (Signed)
Left message to advise patient she should be able to contact the orthopedic office and schedule appointment without a referral. Advised to contact the office if they give her trouble or she has any questions or concerns.

## 2017-03-03 ENCOUNTER — Encounter: Payer: Self-pay | Admitting: Rehabilitative and Restorative Service Providers"

## 2017-03-03 NOTE — Therapy (Signed)
[facility] [address] [place], [place], [address] Phone: [phone]   Fax:  [phone]  Patient Details  Name: Jillian Morris MRN: [id number] Date of Birth: 01/17/1946 Referring Provider:  No ref. provider found  Encounter Date: last encounter 05/13/16  Patient did not return for Physical therapy.  See status from initial evaluation.  Thank you for the referral of this patient. Margretta Dittyhristina Omran Keelin, MPT    Sedonia Kitner 03/03/2017, 9:05 AM  [facility] [address] [place], [place], [address] Phone: [phone]   Fax:  [phone]

## 2017-03-09 DIAGNOSIS — G8929 Other chronic pain: Secondary | ICD-10-CM | POA: Insufficient documentation

## 2017-04-06 ENCOUNTER — Other Ambulatory Visit: Payer: Self-pay | Admitting: Family Medicine

## 2017-04-06 ENCOUNTER — Ambulatory Visit
Admission: RE | Admit: 2017-04-06 | Discharge: 2017-04-06 | Disposition: A | Discharge status: Home / Self Care | Payer: Medicare Other | Source: Ambulatory Visit | Attending: Family Medicine | Admitting: Family Medicine

## 2017-04-06 DIAGNOSIS — R0602 Shortness of breath: Secondary | ICD-10-CM

## 2017-04-15 ENCOUNTER — Other Ambulatory Visit: Payer: Self-pay | Admitting: Orthopedic Surgery

## 2017-05-27 NOTE — Pre-Procedure Instructions (Addendum)
Jillian Morris  05/27/2017      [id number] - [place], [place] - [address] AT [facility] [address] [place] [address] Phone: [phone] Fax: [phone]    Your procedure is scheduled on June 07, 2017.  Report to [facility] Admitting at 615 AM.  Call this number if you have problems the morning of surgery:  [phone]   Remember:  Do not eat food or drink liquids after midnight.  Take these medicines the morning of surgery with A SIP OF WATER alprazolam (xanax), duloxetine (cymbalta), lamotrigine (lamictal), levothyroxine (synthroid), methocarbamil (robaxin)-if needed, ranitidine (zantac).  7 days prior to surgery STOP taking any Aspirin (unless otherwise instructed by your surgeon), Aleve, Naproxen, Ibuprofen, Motrin, Advil, Goody's, BC's, all herbal medications, fish oil, and all vitamins  Continue all other medications as instructed by your physician except follow the above medication instructions before surgery   Do not wear jewelry, make-up or nail polish.  Do not wear lotions, powders, or perfumes, or deoderant.  Do not shave 48 hours prior to surgery.  Do not bring valuables to the hospital.  [facility] is not responsible for any belongings or valuables.  Contacts, dentures or bridgework may not be worn into surgery.  Leave your suitcase in the car.  After surgery it may be brought to your room.  For patients admitted to the hospital, discharge time will be determined by your treatment team.  Patients discharged the day of surgery will not be allowed to drive home.   Special instructions:   [name]- Preparing For Surgery  Before surgery, you can play an important role. Because skin is not sterile, your skin needs to be as free of germs as possible. You can reduce the number of germs on your skin by washing with CHG (chlorahexidine gluconate) Soap before surgery.  CHG is an antiseptic  cleaner which kills germs and bonds with the skin to continue killing germs even after washing.  Please do not use if you have an allergy to CHG or antibacterial soaps. If your skin becomes reddened/irritated stop using the CHG.  Do not shave (including legs and underarms) for at least 48 hours prior to first CHG shower. It is OK to shave your face.  Please follow these instructions carefully.   1. Shower the NIGHT BEFORE SURGERY and the MORNING OF SURGERY with CHG.   2. If you chose to wash your hair, wash your hair first as usual with your normal shampoo.  3. After you shampoo, rinse your hair and body thoroughly to remove the shampoo.  4. Use CHG as you would any other liquid soap. You can apply CHG directly to the skin and wash gently with a scrungie or a clean washcloth.   5. Apply the CHG Soap to your body ONLY FROM THE NECK DOWN.  Do not use on open wounds or open sores. Avoid contact with your eyes, ears, mouth and genitals (private parts). Wash genitals (private parts) with your normal soap.  USE REGULAR SHAMPOO AND CONDITIONER FOR HAIR USE REGULAR SOAP FOR FACE AND PRIVATE AREA  6. Wash thoroughly, paying special attention to the area where your surgery will be performed.  7. Thoroughly rinse your body with warm water from the neck down.  8. DO NOT shower/wash with your normal soap after using and rinsing off the CHG Soap.  9. Pat yourself dry with a CLEAN TOWEL and WASH CLOTH  10.  Wear CLEAN PAJAMAS to bed the night before surgery, wear comfortable clothes the morning of surgery  11. Place CLEAN SHEETS on your bed the night of your first shower and DO NOT SLEEP WITH PETS.    Day of Surgery: Do not apply any deodorants/lotions. Please wear clean clothes to the hospital/surgery center.     Please read over the following fact sheets that you were given. Pain Booklet, Coughing and Deep Breathing, MRSA Information and Surgical Site Infection Prevention

## 2017-05-27 NOTE — Progress Notes (Addendum)
Jillian Morris, Jillian Noa, MD  Cardiologist:  EKG:  Stress test:  ECHO:  Cardiac Cath:  Chest x-ray:04/06/17 in [facility]

## 2017-05-28 ENCOUNTER — Inpatient Hospital Stay (HOSPITAL_COMMUNITY)
Admission: RE | Admit: 2017-05-28 | Discharge: 2017-05-28 | Disposition: A | Discharge status: Home / Self Care | Payer: Medicare Other | Source: Ambulatory Visit

## 2017-06-07 ENCOUNTER — Ambulatory Visit: Admit: 2017-06-07 | Payer: Medicare Other | Admitting: Orthopedic Surgery

## 2017-06-07 SURGERY — ARTHROPLASTY, KNEE, TOTAL
Anesthesia: Spinal | Site: Knee | Laterality: Left

## 2017-06-15 ENCOUNTER — Telehealth: Payer: Self-pay | Admitting: Rheumatology

## 2017-06-15 NOTE — Telephone Encounter (Signed)
Attempted to contact the patient and left message for patient to call the office.  

## 2017-06-15 NOTE — Telephone Encounter (Signed)
Patient left a message 10/22 wanting to be worked in for a rt shoulder injection. Please call to advise.

## 2017-06-16 ENCOUNTER — Other Ambulatory Visit: Payer: Self-pay | Admitting: Physical Medicine and Rehabilitation

## 2017-06-16 DIAGNOSIS — M47816 Spondylosis without myelopathy or radiculopathy, lumbar region: Secondary | ICD-10-CM

## 2017-06-17 NOTE — Telephone Encounter (Signed)
Attempted to contact the patient and left message for patient to call the office.  

## 2017-06-21 NOTE — Telephone Encounter (Signed)
Multiple attempts made to contact patient to schedule. No return call.

## 2017-06-22 ENCOUNTER — Ambulatory Visit: Payer: Medicare Other | Admitting: Rheumatology

## 2017-06-22 ENCOUNTER — Telehealth: Payer: Self-pay | Admitting: Rheumatology
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
## 2017-06-24 NOTE — Telephone Encounter (Signed)
Patient called again to be worked in for right shoulder.  Would like an injection?    Cb# is [phone].  Please advise.  Thank You.

## 2017-06-25 NOTE — Telephone Encounter (Signed)
Attempted to contact the patient anf left message for patient to call the office.

## 2017-06-25 NOTE — Telephone Encounter (Signed)
Patient has been scheduled for 06/29/17

## 2017-06-27 NOTE — Progress Notes (Signed)
Office Visit Note  Patient: Jillian Morris             Date of Birth: April 05, 1946           MRN: [id number]             PCP: Jillian Blamer, MD Referring: Jillian Blamer, MD Visit Date: 06/29/2017 Occupation: @GUAROCC @    Subjective:  Right shoulder pain.   History of Present Illness: Jillian Morris is a 71 y.o. female with history of osteoarthritis, disc disease and fibromyalgia syndrome. She's been having increased pain and discomfort in her right shoulder for the last 2 months. She has difficulty lifting her right arm. There is no history of any injury.she continues to have pain and discomfort in her left knee joint. She is a scheduled to have left total knee replacement in January. Neck and lower back continues to hurt. She continues to have generalized pain from fibromyalgia. Her sicca symptoms persist.  Activities of Daily Living:  Patient reports morning stiffness for 5 minutes.   Patient Reports nocturnal pain.  Difficulty dressing/grooming: Denies Difficulty climbing stairs: Reports Difficulty getting out of chair: Reports Difficulty using hands for taps, buttons, cutlery, and/or writing: Denies   Review of Systems  Constitutional: Positive for fatigue. Negative for night sweats, weight gain, weight loss and weakness.  HENT: Positive for mouth dryness. Negative for mouth sores, trouble swallowing, trouble swallowing and nose dryness.   Eyes: Positive for dryness. Negative for pain, redness and visual disturbance.  Respiratory: Negative for cough, shortness of breath and difficulty breathing.   Cardiovascular: Negative for chest pain, palpitations, hypertension, irregular heartbeat and swelling in legs/feet.  Gastrointestinal: Negative for blood in stool, constipation and diarrhea.  Endocrine: Negative for increased urination.  Genitourinary: Negative for vaginal dryness.  Musculoskeletal: Positive for arthralgias, joint pain and morning stiffness. Negative for joint  swelling, myalgias, muscle weakness, muscle tenderness and myalgias.  Skin: Negative for color change, rash, hair loss, skin tightness, ulcers and sensitivity to sunlight.  Allergic/Immunologic: Negative for susceptible to infections.  Neurological: Negative for dizziness, memory loss and night sweats.  Hematological: Negative for swollen glands.  Psychiatric/Behavioral: Positive for depressed mood and sleep disturbance. The patient is not nervous/anxious.     PMFS History:  Patient Active Problem List   Diagnosis Date Noted  . Sjogren's disease (HCC) 06/29/2017  . DDD (degenerative disc disease), cervical 06/29/2017  . DDD (degenerative disc disease), lumbar 06/29/2017  . Primary osteoarthritis of left knee 06/29/2017  . History of total knee replacement, right 06/29/2017  . Age-related osteoporosis without current pathological fracture 06/29/2017  . Essential tremor 06/29/2017  . History of TIA (transient ischemic attack) 06/29/2017  . 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  . Fall 04/20/2016  . Multiple facial fractures (HCC) 04/20/2016  . Right wrist fracture 04/20/2016  . Closed fracture of right distal radius   . Closed fracture of zygomatic arch (HCC)   . Depression   . Fibromyalgia   . Traumatic subarachnoid bleed with LOC of 1 hour to 5 hours 59 minutes (HCC) 04/16/2016  . Achalasia 08/09/2013  . Stricture and stenosis of esophagus 03/30/2012  . Dysphagia, unspecified(787.20) 03/21/2012  . Special screening for malignant neoplasms, colon 03/21/2012  . Esophageal reflux 03/21/2012    Past Medical History:  Diagnosis Date  . Acne rosacea   . Anemia    s/p gastric bypass, malabsorption  . Anxiety   . Arthritis   . B12 deficiency   . Chronic fatigue fibromyalgia syndrome   .  Chronic low back pain   . Depression   . Fibromyalgia   . GERD (gastroesophageal reflux disease)   . Jillian Morris disease   . H/O hiatal hernia   . Hypothyroidism   . IBS (irritable bowel syndrome)   .  Lymphedema   . Meralgia paraesthetica   . Sjogren's syndrome (HCC)   . Status post dilation of esophageal narrowing   . Stroke [facility](HCC)     Family History  Problem Relation Age of Onset  . Prostate cancer Maternal Grandfather   . Lymphoma Maternal Uncle   . Heart disease Father   . Heart disease Sister   . Irritable bowel syndrome Sister   . Dementia Mother   . Stroke Maternal Grandmother   . Appendicitis Paternal Grandmother   . Colon cancer Neg Hx    Past Surgical History:  Procedure Laterality Date  . ABDOMINAL HYSTERECTOMY    . APPENDECTOMY    . ENDOMETRIAL ABLATION     x 4  . GASTRIC BYPASS    . JOINT REPLACEMENT     right knee   . KNEE ARTHROPLASTY    . LAMINECTOMY     L3-L5  . 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 THYROIDECTOMY     Social History   Social History Narrative   Right handed, Married, Step kids 2, Caffeine 4-5 cups daily, graduate school   Rare caffeine use      Objective: Vital Signs: BP (!) 147/75 (BP Location: Left Arm, Patient Position: Sitting, Cuff Size: Normal)   Pulse 87   Resp 17   Ht 5\' 4"  (1.626 m)   Wt 184 lb (83.5 kg)   BMI 31.58 kg/m    Physical Exam  Constitutional: She is oriented to person, place, and time. She appears well-developed and well-nourished.  HENT:  Head: Normocephalic and atraumatic.  Eyes: Conjunctivae and EOM are normal.  Neck: Normal range of motion.  Cardiovascular: Normal rate, regular rhythm, normal heart sounds and intact distal pulses.  Pulmonary/Chest: Effort normal and breath sounds normal.  Abdominal: Soft. Bowel sounds are normal.  Lymphadenopathy:    She has no cervical adenopathy.  Neurological: She is alert and oriented to person, place, and time.  Skin: Skin is warm and dry. Capillary refill takes less than 2 seconds.  Psychiatric: She has a normal mood and affect. Her behavior is normal.  Nursing note and vitals reviewed.    Musculoskeletal Exam: C-spine and thoracic lumbar scar  limited range of motion of his discomfort. She had painful limited range of motion of her right shoulder. Elbow joints wrist joints are good range of motion. She has bilateral DIP thickening. Hip joints  Were difficult to assess as she was in the sitting position. Her  Right total knee replacement is doing well. She has discomfort with left range of motion of left knee.  CDAI Exam: No CDAI exam completed.    Investigation: No additional findings.   Imaging: Xr Shoulder Right  Result Date: 06/29/2017 Myalgias inferior spurring was noted. Cystic changes were noted in and the humerus.no glenohumeral joint space narrowing was noted. No acromioclavicular joint space narrowing was noted.    Speciality Comments: No specialty comments available.    Procedures:  Large Joint Inj: R glenohumeral on 06/29/2017 2:21 PM Indications: pain Details: 27 G 1.5 in needle, posterior approach  Arthrogram: No  Medications: 1 mL lidocaine 1 %; 40 mg triamcinolone acetonide 40 MG/ML Aspirate: 0 mL Outcome: tolerated well, no immediate complications Procedure,  treatment alternatives, risks and benefits explained, specific risks discussed. Consent was given by the patient. Immediately prior to procedure a time out was called to verify the correct patient, procedure, equipment, support staff and site/side marked as required. Patient was prepped and draped in the usual sterile fashion.     Allergies: Actonel [risedronate sodium]; Ambien [zolpidem tartrate]; Amoxicillin-pot clavulanate; Depakote [divalproex sodium]; Inderal [propranolol]; Nsaids; Penicillins; Silicon; Adhesive [tape]; Latex; and Nickel   Assessment / Plan:     Visit Diagnoses: Chronic right shoulder pain -patient is having pain and discomfort range of motion of her right shoulder. Plan: XR Shoulder Right the x-ray was unremarkable except for some cystic changes in the humerus and inferior spurring. After different treatment options were  discussed and informed consent was obtained right shoulder was injected with cortisone as described above. She tolerated the procedure well. I've also given her a handout on shoulder joint exercises.post injection precautions were discussed.  Sicca syndrome, unspecified (HCC); She continues to have some sicca symptoms. For which she's been using over-the-counter products.  Fibromyalgia: She has generalized pain and discomfort from fibromyalgia.  DDD (degenerative disc disease), cervical: She has limited range of motion with the stiffness.  DDD (degenerative disc disease), lumbar - s/p laminectomy : She continues to have some lower back pain.  Primary osteoarthritis of left knee: She is planning left totally replacement in January.  History of total knee replacement, right :doing well.  Age-related osteoporosis without current pathological fracture; Followed up by her PCP.  History of hypertension: Her blood pressure is controlled.  Her other medical problems are listed as follows:  History of hypothyroidism  History of gastroesophageal reflux (GERD)  History of chronic kidney disease  History of rosacea  History of depression  History of anxiety  History of migraine  History of IBS  Retinal disorder  History of anemia  S/P gastric bypass  History of TIA (transient ischemic attack)  Essential tremor  History of appendectomy  History of thyroidectomy    Orders: Orders Placed This Encounter  Procedures  . Large Joint Inj  . XR Shoulder Right   No orders of the defined types were placed in this encounter.   Face-to-face time spent with patient was 30 minutes. Greater than50% of time was spent in counseling and coordination of care.  Follow-Up Instructions: Return if symptoms worsen or fail to improve, for Sjogren's, OA< DDD, FMS.   Pollyann Savoy, MD  Note - This record has been created using Animal nutritionist.  Chart creation errors have been sought,  but may not always  have been located. Such creation errors do not reflect on  the standard of medical care.

## 2017-06-29 ENCOUNTER — Ambulatory Visit: Payer: Medicare Other | Admitting: Rheumatology

## 2017-06-29 ENCOUNTER — Encounter: Payer: Self-pay | Admitting: Rheumatology

## 2017-06-29 ENCOUNTER — Ambulatory Visit (INDEPENDENT_AMBULATORY_CARE_PROVIDER_SITE_OTHER): Payer: Medicare Other

## 2017-06-29 VITALS — BP 147/75 | HR 87 | Resp 17 | Ht 64.0 in | Wt 184.0 lb

## 2017-06-29 DIAGNOSIS — E890 Postprocedural hypothyroidism: Secondary | ICD-10-CM

## 2017-06-29 DIAGNOSIS — Z9009 Acquired absence of other part of head and neck: Secondary | ICD-10-CM

## 2017-06-29 DIAGNOSIS — Z9884 Bariatric surgery status: Secondary | ICD-10-CM

## 2017-06-29 DIAGNOSIS — M5030 Other cervical disc degeneration, unspecified cervical region: Secondary | ICD-10-CM

## 2017-06-29 DIAGNOSIS — Z87448 Personal history of other diseases of urinary system: Secondary | ICD-10-CM

## 2017-06-29 DIAGNOSIS — M1712 Unilateral primary osteoarthritis, left knee: Secondary | ICD-10-CM

## 2017-06-29 DIAGNOSIS — Z862 Personal history of diseases of the blood and blood-forming organs and certain disorders involving the immune mechanism: Secondary | ICD-10-CM

## 2017-06-29 DIAGNOSIS — Z8659 Personal history of other mental and behavioral disorders: Secondary | ICD-10-CM

## 2017-06-29 DIAGNOSIS — Z96651 Presence of right artificial knee joint: Secondary | ICD-10-CM

## 2017-06-29 DIAGNOSIS — Z8719 Personal history of other diseases of the digestive system: Secondary | ICD-10-CM

## 2017-06-29 DIAGNOSIS — H359 Unspecified retinal disorder: Secondary | ICD-10-CM

## 2017-06-29 DIAGNOSIS — Z8679 Personal history of other diseases of the circulatory system: Secondary | ICD-10-CM

## 2017-06-29 DIAGNOSIS — G250 Essential tremor: Secondary | ICD-10-CM

## 2017-06-29 DIAGNOSIS — M51369 Other intervertebral disc degeneration, lumbar region without mention of lumbar back pain or lower extremity pain: Secondary | ICD-10-CM

## 2017-06-29 DIAGNOSIS — M3500 Sicca syndrome, unspecified: Secondary | ICD-10-CM

## 2017-06-29 DIAGNOSIS — Z8673 Personal history of transient ischemic attack (TIA), and cerebral infarction without residual deficits: Secondary | ICD-10-CM

## 2017-06-29 DIAGNOSIS — M25511 Pain in right shoulder: Secondary | ICD-10-CM

## 2017-06-29 DIAGNOSIS — Z8639 Personal history of other endocrine, nutritional and metabolic disease: Secondary | ICD-10-CM

## 2017-06-29 DIAGNOSIS — M810 Age-related osteoporosis without current pathological fracture: Secondary | ICD-10-CM

## 2017-06-29 DIAGNOSIS — G8929 Other chronic pain: Secondary | ICD-10-CM

## 2017-06-29 DIAGNOSIS — Z872 Personal history of diseases of the skin and subcutaneous tissue: Secondary | ICD-10-CM

## 2017-06-29 DIAGNOSIS — M797 Fibromyalgia: Secondary | ICD-10-CM

## 2017-06-29 DIAGNOSIS — Z9889 Other specified postprocedural states: Secondary | ICD-10-CM

## 2017-06-29 DIAGNOSIS — M5136 Other intervertebral disc degeneration, lumbar region: Secondary | ICD-10-CM | POA: Insufficient documentation

## 2017-06-29 DIAGNOSIS — Z9049 Acquired absence of other specified parts of digestive tract: Secondary | ICD-10-CM

## 2017-06-29 DIAGNOSIS — Z8669 Personal history of other diseases of the nervous system and sense organs: Secondary | ICD-10-CM

## 2017-06-29 HISTORY — DX: Sjogren syndrome, unspecified: M35.00

## 2017-06-29 HISTORY — DX: Unilateral primary osteoarthritis, left knee: M17.12

## 2017-06-29 HISTORY — DX: Personal history of transient ischemic attack (TIA), and cerebral infarction without residual deficits: Z86.73

## 2017-06-29 HISTORY — DX: Age-related osteoporosis without current pathological fracture: M81.0

## 2017-06-29 HISTORY — DX: Other cervical disc degeneration, unspecified cervical region: M50.30

## 2017-06-29 HISTORY — DX: Bariatric surgery status: Z98.84

## 2017-06-29 HISTORY — DX: Other intervertebral disc degeneration, lumbar region without mention of lumbar back pain or lower extremity pain: M51.369

## 2017-06-29 HISTORY — DX: Presence of right artificial knee joint: Z96.651

## 2017-06-29 HISTORY — DX: Other intervertebral disc degeneration, lumbar region: M51.36

## 2017-06-29 HISTORY — DX: Essential tremor: G25.0

## 2017-06-29 MED ORDER — TRIAMCINOLONE ACETONIDE 40 MG/ML IJ SUSP
40.0000 mg | INTRAMUSCULAR | Status: AC | PRN
  Administered 2017-06-29: 40 mg via INTRA_ARTICULAR

## 2017-06-29 MED ORDER — LIDOCAINE HCL 1 % IJ SOLN
1.0000 mL | INTRAMUSCULAR | Status: AC | PRN
  Administered 2017-06-29: 1 mL

## 2017-06-29 NOTE — Patient Instructions (Signed)
Shoulder Exercises Ask your health care provider which exercises are safe for you. Do exercises exactly as told by your health care provider and adjust them as directed. It is normal to feel mild stretching, pulling, tightness, or discomfort as you do these exercises, but you should stop right away if you feel sudden pain or your pain gets worse.Do not begin these exercises until told by your health care provider. RANGE OF MOTION EXERCISES These exercises warm up your muscles and joints and improve the movement and flexibility of your shoulder. These exercises also help to relieve pain, numbness, and tingling. These exercises involve stretching your injured shoulder directly. Exercise A: Pendulum  1. Stand near a wall or a surface that you can hold onto for balance. 2. Bend at the waist and let your left / right arm hang straight down. Use your other arm to support you. Keep your back straight and do not lock your knees. 3. Relax your left / right arm and shoulder muscles, and move your hips and your trunk so your left / right arm swings freely. Your arm should swing because of the motion of your body, not because you are using your arm or shoulder muscles. 4. Keep moving your body so your arm swings in the following directions, as told by your health care provider: ? Side to side. ? Forward and backward. ? In clockwise and counterclockwise circles. 5. Continue each motion for __________ seconds, or for as long as told by your health care provider. 6. Slowly return to the starting position. Repeat __________ times. Complete this exercise __________ times a day. Exercise B:Flexion, Standing  1. Stand and hold a broomstick, a cane, or a similar object. Place your hands a little more than shoulder-width apart on the object. Your left / right hand should be palm-up, and your other hand should be palm-down. 2. Keep your elbow straight and keep your shoulder muscles relaxed. Push the stick down with  your healthy arm to raise your left / right arm in front of your body, and then over your head until you feel a stretch in your shoulder. ? Avoid shrugging your shoulder while you raise your arm. Keep your shoulder blade tucked down toward the middle of your back. 3. Hold for __________ seconds. 4. Slowly return to the starting position. Repeat __________ times. Complete this exercise __________ times a day. Exercise C: Abduction, Standing 1. Stand and hold a broomstick, a cane, or a similar object. Place your hands a little more than shoulder-width apart on the object. Your left / right hand should be palm-up, and your other hand should be palm-down. 2. While keeping your elbow straight and your shoulder muscles relaxed, push the stick across your body toward your left / right side. Raise your left / right arm to the side of your body and then over your head until you feel a stretch in your shoulder. ? Do not raise your arm above shoulder height, unless your health care provider tells you to do that. ? Avoid shrugging your shoulder while you raise your arm. Keep your shoulder blade tucked down toward the middle of your back. 3. Hold for __________ seconds. 4. Slowly return to the starting position. Repeat __________ times. Complete this exercise __________ times a day. Exercise D:Internal Rotation  1. Place your left / right hand behind your back, palm-up. 2. Use your other hand to dangle an exercise band, a towel, or a similar object over your shoulder. Grasp the band with   your left / right hand so you are holding onto both ends. 3. Gently pull up on the band until you feel a stretch in the front of your left / right shoulder. ? Avoid shrugging your shoulder while you raise your arm. Keep your shoulder blade tucked down toward the middle of your back. 4. Hold for __________ seconds. 5. Release the stretch by letting go of the band and lowering your hands. Repeat __________ times. Complete  this exercise __________ times a day. STRETCHING EXERCISES These exercises warm up your muscles and joints and improve the movement and flexibility of your shoulder. These exercises also help to relieve pain, numbness, and tingling. These exercises are done using your healthy shoulder to help stretch the muscles of your injured shoulder. Exercise E: Corner Stretch (External Rotation and Abduction)  1. Stand in a doorway with one of your feet slightly in front of the other. This is called a staggered stance. If you cannot reach your forearms to the door frame, stand facing a corner of a room. 2. Choose one of the following positions as told by your health care provider: ? Place your hands and forearms on the door frame above your head. ? Place your hands and forearms on the door frame at the height of your head. ? Place your hands on the door frame at the height of your elbows. 3. Slowly move your weight onto your front foot until you feel a stretch across your chest and in the front of your shoulders. Keep your head and chest upright and keep your abdominal muscles tight. 4. Hold for __________ seconds. 5. To release the stretch, shift your weight to your back foot. Repeat __________ times. Complete this stretch __________ times a day. Exercise F:Extension, Standing 1. Stand and hold a broomstick, a cane, or a similar object behind your back. ? Your hands should be a little wider than shoulder-width apart. ? Your palms should face away from your back. 2. Keeping your elbows straight and keeping your shoulder muscles relaxed, move the stick away from your body until you feel a stretch in your shoulder. ? Avoid shrugging your shoulders while you move the stick. Keep your shoulder blade tucked down toward the middle of your back. 3. Hold for __________ seconds. 4. Slowly return to the starting position. Repeat __________ times. Complete this exercise __________ times a day. STRENGTHENING  EXERCISES These exercises build strength and endurance in your shoulder. Endurance is the ability to use your muscles for a long time, even after they get tired. Exercise G:External Rotation  1. Sit in a stable chair without armrests. 2. Secure an exercise band at elbow height on your left / right side. 3. Place a soft object, such as a folded towel or a small pillow, between your left / right upper arm and your body to move your elbow a few inches away (about 10 cm) from your side. 4. Hold the end of the band so it is tight and there is no slack. 5. Keeping your elbow pressed against the soft object, move your left / right forearm out, away from your abdomen. Keep your body steady so only your forearm moves. 6. Hold for __________ seconds. 7. Slowly return to the starting position. Repeat __________ times. Complete this exercise __________ times a day. Exercise H:Shoulder Abduction  1. Sit in a stable chair without armrests, or stand. 2. Hold a __________ weight in your left / right hand, or hold an exercise band with both hands.   3. Start with your arms straight down and your left / right palm facing in, toward your body. 4. Slowly lift your left / right hand out to your side. Do not lift your hand above shoulder height unless your health care provider tells you that this is safe. ? Keep your arms straight. ? Avoid shrugging your shoulder while you do this movement. Keep your shoulder blade tucked down toward the middle of your back. 5. Hold for __________ seconds. 6. Slowly lower your arm, and return to the starting position. Repeat __________ times. Complete this exercise __________ times a day. Exercise I:Shoulder Extension 1. Sit in a stable chair without armrests, or stand. 2. Secure an exercise band to a stable object in front of you where it is at shoulder height. 3. Hold one end of the exercise band in each hand. Your palms should face each other. 4. Straighten your elbows and  lift your hands up to shoulder height. 5. Step back, away from the secured end of the exercise band, until the band is tight and there is no slack. 6. Squeeze your shoulder blades together as you pull your hands down to the sides of your thighs. Stop when your hands are straight down by your sides. Do not let your hands go behind your body. 7. Hold for __________ seconds. 8. Slowly return to the starting position. Repeat __________ times. Complete this exercise __________ times a day. Exercise J:Standing Shoulder Row 1. Sit in a stable chair without armrests, or stand. 2. Secure an exercise band to a stable object in front of you so it is at waist height. 3. Hold one end of the exercise band in each hand. Your palms should be in a thumbs-up position. 4. Bend each of your elbows to an "L" shape (about 90 degrees) and keep your upper arms at your sides. 5. Step back until the band is tight and there is no slack. 6. Slowly pull your elbows back behind you. 7. Hold for __________ seconds. 8. Slowly return to the starting position. Repeat __________ times. Complete this exercise __________ times a day. Exercise K:Shoulder Press-Ups  1. Sit in a stable chair that has armrests. Sit upright, with your feet flat on the floor. 2. Put your hands on the armrests so your elbows are bent and your fingers are pointing forward. Your hands should be about even with the sides of your body. 3. Push down on the armrests and use your arms to lift yourself off of the chair. Straighten your elbows and lift yourself up as much as you comfortably can. ? Move your shoulder blades down, and avoid letting your shoulders move up toward your ears. ? Keep your feet on the ground. As you get stronger, your feet should support less of your body weight as you lift yourself up. 4. Hold for __________ seconds. 5. Slowly lower yourself back into the chair. Repeat __________ times. Complete this exercise __________ times a  day. Exercise L: Wall Push-Ups  1. Stand so you are facing a stable wall. Your feet should be about one arm-length away from the wall. 2. Lean forward and place your palms on the wall at shoulder height. 3. Keep your feet flat on the floor as you bend your elbows and lean forward toward the wall. 4. Hold for __________ seconds. 5. Straighten your elbows to push yourself back to the starting position. Repeat __________ times. Complete this exercise __________ times a day. This information is not intended to replace advice   given to you by your health care provider. Make sure you discuss any questions you have with your health care provider. Document Released: 06/24/2005 Document Revised: 05/04/2016 Document Reviewed: 04/21/2015 [employer] Interactive Patient Education  2018 [employer]  

## 2018-04-09 ENCOUNTER — Other Ambulatory Visit: Payer: Self-pay | Admitting: Physical Medicine and Rehabilitation

## 2018-04-09 DIAGNOSIS — M47812 Spondylosis without myelopathy or radiculopathy, cervical region: Secondary | ICD-10-CM

## 2018-04-12 ENCOUNTER — Other Ambulatory Visit: Payer: Medicare Other

## 2018-04-17 ENCOUNTER — Other Ambulatory Visit: Payer: Medicare Other

## 2018-04-20 ENCOUNTER — Ambulatory Visit
Admission: RE | Admit: 2018-04-20 | Discharge: 2018-04-20 | Disposition: A | Discharge status: Home / Self Care | Payer: Medicare Other | Source: Ambulatory Visit | Attending: Physical Medicine and Rehabilitation | Admitting: Physical Medicine and Rehabilitation

## 2018-04-20 DIAGNOSIS — M47812 Spondylosis without myelopathy or radiculopathy, cervical region: Secondary | ICD-10-CM

## 2018-04-28 DIAGNOSIS — F3181 Bipolar II disorder: Secondary | ICD-10-CM

## 2018-04-28 HISTORY — DX: Bipolar II disorder: F31.81

## 2018-05-24 ENCOUNTER — Ambulatory Visit (INDEPENDENT_AMBULATORY_CARE_PROVIDER_SITE_OTHER): Payer: Medicare Other | Admitting: Psychiatry

## 2018-05-24 DIAGNOSIS — F331 Major depressive disorder, recurrent, moderate: Secondary | ICD-10-CM

## 2018-05-24 DIAGNOSIS — G4701 Insomnia due to medical condition: Secondary | ICD-10-CM

## 2018-05-24 DIAGNOSIS — T887XXA Unspecified adverse effect of drug or medicament, initial encounter: Secondary | ICD-10-CM | POA: Diagnosis not present

## 2018-05-24 MED ORDER — BREXPIPRAZOLE 1 MG PO TABS: 0.2500 mg | ORAL_TABLET | Freq: Every day | ORAL | 1 refills | Status: DC

## 2018-05-24 NOTE — Patient Instructions (Signed)
Take Rexulti every other day for at least 3-4 weeks.  Call if it does not help by that time or if having side effects.

## 2018-05-24 NOTE — Progress Notes (Addendum)
[facility]  Patient ID: Jillian Morris,  MRN: [id number]  PCP: Johny Blamer, MD  Date of Evaluation: 05/24/2018 Time spent:30 minutes   HISTORY/CURRENT STATUS: Took Rexulti, saw mood improvement but 2nd week couldn't tolerate bc diff swallowing, insomnia, blurred vision.  No SOB.  Has a small esophagus.Started 0.5mg  x 2 weeks then to 1mg  SE immediate.  Depression returned 3 week after off med.  Depression pretty bad again, but handling it better by staying out of bed.  Put on makeup.   Lorazepam gives her 4 hours of sleep.  Slept better with combo with Xanax but knows not to do that.   Individual Medical History/ Review of Systems: Changes? :Yes 2 grandkids with active addiction and 1 also pregnant.  Allergies: Actonel [risedronate sodium]; Ambien [zolpidem tartrate]; Amoxicillin-pot clavulanate; Depakote [divalproex sodium]; Inderal [propranolol]; Nsaids; Penicillins; Silicon; Adhesive [tape]; Latex; and Nickel  Current Medications:  Current Outpatient Medications:  .  DULoxetine (CYMBALTA) 60 MG capsule, Take 1 capsule (60 mg total) by mouth 2 (two) times daily., Disp: 60 capsule, Rfl: 3 .  lamoTRIgine (LAMICTAL) 100 MG tablet, Take 3 tablets (300 mg total) by mouth at bedtime., Disp: 30 tablet, Rfl: 0 .  levothyroxine (SYNTHROID) 125 MCG tablet, Take 1 tablet (125 mcg total) by mouth daily before breakfast., Disp: 30 tablet, Rfl: 0 .  LORazepam (ATIVAN) 1 MG tablet, Take 1 mg at bedtime by mouth., Disp: , Rfl:  .  oxymorphone (OPANA) 5 MG tablet, Take 5 mg every 4 (four) hours as needed by mouth for pain., Disp: , Rfl:  .  rOPINIRole (REQUIP) 1 MG tablet, 1 TABLET (1 mg) BEFORE BEDTIME as needed for restless leg syndrome, Disp: , Rfl: 6 .  SYNTHROID 125 MCG tablet, TAKE 1 TABLET (125 mcg) BY MOUTH EVERY MORNING ON EMPTY STOMACH ONCE DAILY, Disp: , Rfl: 3 .  ALPRAZolam (XANAX) 0.25 MG tablet, Take 1 tablet (0.25 mg total) by mouth 2 (two) times daily as needed (for  panic attacks)., Disp: 15 tablet, Rfl: 0 .  Brexpiprazole (REXULTI) 1 MG TABS, Take 0.25 tablets (0.25 mg total) by mouth daily., Disp: 30 tablet, Rfl: 1 .  cyanocobalamin (,VITAMIN B-12,) 1000 MCG/ML injection, Inject 1,000 mcg into the muscle every 30 (thirty) days. , Disp: , Rfl:  .  dexlansoprazole (DEXILANT) 60 MG capsule, TAKE 1 CAPSULE (60 MG TOTAL) BY MOUTH DAILY. (Patient not taking: Reported on 04/16/2016), Disp: 90 capsule, Rfl: 3 .  docusate sodium (COLACE) 100 MG capsule, Take 1 capsule (100 mg total) by mouth 2 (two) times daily., Disp: 10 capsule, Rfl: 0 .  hydrocortisone valerate ointment (WEST-CORT) 0.2 %, as needed., Disp: , Rfl:  .  methocarbamol (ROBAXIN) 500 MG tablet, Take 1 tablet (500 mg total) by mouth every 6 (six) hours as needed for muscle spasms., Disp: 60 tablet, Rfl: 0 .  metroNIDAZOLE (METROCREAM) 0.75 % cream, Apply 1 application topically 2 (two) times daily. , Disp: , Rfl:  .  modafinil (PROVIGIL) 200 MG tablet, Take 200 mg by mouth daily., Disp: , Rfl:  .  morphine (MS CONTIN) 30 MG 12 hr tablet, Take 1 tablet (30 mg total) by mouth every 12 (twelve) hours. (Patient not taking: Reported on 05/11/2016), Disp: 14 tablet, Rfl: 0 .  morphine (MSIR) 30 MG tablet, Take 1 tablet (30 mg total) by mouth every 6 (six) hours as needed (pain). (Patient not taking: Reported on 05/11/2016), Disp: 20 tablet, Rfl: 0 .  mupirocin cream (BACTROBAN) 2 %, as needed., Disp: ,  Rfl:  .  polyethylene glycol (MIRALAX / GLYCOLAX) packet, Take 17 g by mouth daily. (Patient not taking: Reported on 05/11/2016), Disp: 14 each, Rfl: 0 .  ranitidine (ZANTAC) 300 MG tablet, Take 300 mg by mouth at bedtime. , Disp: , Rfl:  Medication Side Effects: Other: See HPI  Family Medical/ Social History: Changes? Yes Death of sister recently.  MENTAL HEALTH EXAM:  There were no vitals taken for this visit.There is no height or weight on file to calculate BMI.  General Appearance: Neat  Eye Contact:  Good   Speech:  Normal Rate  Volume:  Normal  Mood:  Euthymic  Affect:  Depressed  Thought Process:  Goal Directed  Orientation:  Full (Time, Place, and Person)  Thought Content: WDL   Suicidal Thoughts:  No  Homicidal Thoughts:  No  Memory:  Recent  Judgement:  Good  Insight:  Fair  Psychomotor Activity:  Normal  Concentration:  Concentration: Good  Recall:  Good  Fund of Knowledge: Good  Language: Good  Akathisia:  No  AIMS (if indicated): not done  Assets:  Psychologist, counselling  ADL's:  Intact  Cognition: WNL  Prognosis:  Fair    DIAGNOSES:    ICD-10-CM   1. 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 Insomnia due to medical condition G47.01   3. Medication side effect T88.7XXA     RECOMMENDATIONS: Sleep hygiene discussed.  Discussed danger of combo BZ.  Disc option of D/C lorazepam and increase Xanax instead.  Discussed various other sleep combination options.  Explained half life of Rexulti in detail and ways to get benefit with lower doses  And to manage side effects.  Will start with qod for at least 3-4 weeks before considering qd.  Call before changing. Clarification Rexulti dose will be 0.25mg  QOD for 3-4 weeks then 0.25 mg daily if needed. Lauraine Rinne, MD

## 2018-05-26 ENCOUNTER — Other Ambulatory Visit: Payer: Self-pay

## 2018-05-26 MED ORDER — BREXPIPRAZOLE 0.25 MG PO TABS: 0.2500 mg | ORAL_TABLET | Freq: Every day | ORAL | 2 refills | Status: DC

## 2018-05-30 ENCOUNTER — Other Ambulatory Visit: Payer: Self-pay

## 2018-05-31 ENCOUNTER — Other Ambulatory Visit: Payer: Self-pay

## 2018-05-31 MED ORDER — BREXPIPRAZOLE 1 MG PO TABS: 1.0000 mg | ORAL_TABLET | Freq: Every day | ORAL | 0 refills | Status: DC

## 2018-07-10 ENCOUNTER — Encounter: Payer: Self-pay | Admitting: Emergency Medicine

## 2018-07-10 DIAGNOSIS — G4700 Insomnia, unspecified: Secondary | ICD-10-CM

## 2018-07-10 DIAGNOSIS — F411 Generalized anxiety disorder: Secondary | ICD-10-CM | POA: Insufficient documentation

## 2018-07-10 HISTORY — DX: Generalized anxiety disorder: F41.1

## 2018-07-10 HISTORY — DX: Insomnia, unspecified: G47.00

## 2018-07-11 ENCOUNTER — Other Ambulatory Visit: Payer: Self-pay

## 2018-07-11 MED ORDER — LAMOTRIGINE 100 MG PO TABS: 300.0000 mg | ORAL_TABLET | Freq: Every day | ORAL | 0 refills | Status: DC

## 2018-07-25 ENCOUNTER — Other Ambulatory Visit: Payer: Self-pay

## 2018-07-25 MED ORDER — DULOXETINE HCL 60 MG PO CPEP: 120.0000 mg | ORAL_CAPSULE | Freq: Every evening | ORAL | 0 refills | Status: DC

## 2018-07-27 ENCOUNTER — Ambulatory Visit: Payer: Medicare Other | Admitting: Psychiatry

## 2018-07-29 ENCOUNTER — Other Ambulatory Visit: Payer: Self-pay | Admitting: Psychiatry

## 2018-07-29 MED ORDER — BREXPIPRAZOLE 0.25 MG PO TABS: 0.2500 | ORAL_TABLET | Freq: Every day | ORAL | 1 refills | Status: DC

## 2018-07-29 NOTE — Telephone Encounter (Signed)
Clarify dose

## 2018-08-24 ENCOUNTER — Other Ambulatory Visit: Payer: Self-pay | Admitting: Psychiatry

## 2018-09-13 ENCOUNTER — Ambulatory Visit: Payer: Medicare Other | Admitting: Psychiatry

## 2018-09-18 ENCOUNTER — Other Ambulatory Visit: Payer: Self-pay | Admitting: Psychiatry

## 2018-10-24 ENCOUNTER — Ambulatory Visit: Payer: Medicare Other | Admitting: Psychiatry

## 2018-10-24 ENCOUNTER — Other Ambulatory Visit: Payer: Self-pay | Admitting: Orthopedic Surgery

## 2018-11-14 ENCOUNTER — Other Ambulatory Visit: Payer: Self-pay | Admitting: Psychiatry

## 2018-12-05 ENCOUNTER — Emergency Department (HOSPITAL_COMMUNITY): Payer: Medicare Other

## 2018-12-05 ENCOUNTER — Observation Stay (HOSPITAL_COMMUNITY)
Admission: EM | Admit: 2018-12-05 | Discharge: 2018-12-07 | Disposition: A | Discharge status: Home / Self Care | Payer: Medicare Other | Attending: Family Medicine | Admitting: Family Medicine

## 2018-12-05 DIAGNOSIS — N179 Acute kidney failure, unspecified: Secondary | ICD-10-CM | POA: Insufficient documentation

## 2018-12-05 DIAGNOSIS — F329 Major depressive disorder, single episode, unspecified: Secondary | ICD-10-CM | POA: Diagnosis not present

## 2018-12-05 DIAGNOSIS — N289 Disorder of kidney and ureter, unspecified: Secondary | ICD-10-CM | POA: Diagnosis present

## 2018-12-05 DIAGNOSIS — Z8673 Personal history of transient ischemic attack (TIA), and cerebral infarction without residual deficits: Secondary | ICD-10-CM | POA: Diagnosis not present

## 2018-12-05 DIAGNOSIS — Z8249 Family history of ischemic heart disease and other diseases of the circulatory system: Secondary | ICD-10-CM | POA: Insufficient documentation

## 2018-12-05 DIAGNOSIS — Z886 Allergy status to analgesic agent status: Secondary | ICD-10-CM | POA: Insufficient documentation

## 2018-12-05 DIAGNOSIS — Z96651 Presence of right artificial knee joint: Secondary | ICD-10-CM | POA: Insufficient documentation

## 2018-12-05 DIAGNOSIS — Z9884 Bariatric surgery status: Secondary | ICD-10-CM | POA: Diagnosis not present

## 2018-12-05 DIAGNOSIS — K219 Gastro-esophageal reflux disease without esophagitis: Secondary | ICD-10-CM | POA: Diagnosis not present

## 2018-12-05 DIAGNOSIS — Z20828 Contact with and (suspected) exposure to other viral communicable diseases: Secondary | ICD-10-CM | POA: Diagnosis not present

## 2018-12-05 DIAGNOSIS — E0500 Thyrotoxicosis with diffuse goiter without thyrotoxic crisis or storm: Secondary | ICD-10-CM | POA: Insufficient documentation

## 2018-12-05 DIAGNOSIS — Z79890 Hormone replacement therapy (postmenopausal): Secondary | ICD-10-CM | POA: Diagnosis not present

## 2018-12-05 DIAGNOSIS — D638 Anemia in other chronic diseases classified elsewhere: Secondary | ICD-10-CM | POA: Insufficient documentation

## 2018-12-05 DIAGNOSIS — L509 Urticaria, unspecified: Secondary | ICD-10-CM | POA: Insufficient documentation

## 2018-12-05 DIAGNOSIS — I10 Essential (primary) hypertension: Secondary | ICD-10-CM | POA: Insufficient documentation

## 2018-12-05 DIAGNOSIS — M3500 Sicca syndrome, unspecified: Secondary | ICD-10-CM | POA: Diagnosis not present

## 2018-12-05 DIAGNOSIS — E871 Hypo-osmolality and hyponatremia: Secondary | ICD-10-CM

## 2018-12-05 DIAGNOSIS — Z91040 Latex allergy status: Secondary | ICD-10-CM | POA: Insufficient documentation

## 2018-12-05 DIAGNOSIS — Z79899 Other long term (current) drug therapy: Secondary | ICD-10-CM | POA: Diagnosis not present

## 2018-12-05 DIAGNOSIS — Z880 Allergy status to penicillin: Secondary | ICD-10-CM | POA: Insufficient documentation

## 2018-12-05 DIAGNOSIS — D649 Anemia, unspecified: Secondary | ICD-10-CM | POA: Diagnosis present

## 2018-12-05 DIAGNOSIS — G9341 Metabolic encephalopathy: Secondary | ICD-10-CM | POA: Diagnosis not present

## 2018-12-05 DIAGNOSIS — E039 Hypothyroidism, unspecified: Secondary | ICD-10-CM | POA: Diagnosis not present

## 2018-12-05 DIAGNOSIS — R4182 Altered mental status, unspecified: Secondary | ICD-10-CM | POA: Diagnosis present

## 2018-12-05 DIAGNOSIS — M797 Fibromyalgia: Secondary | ICD-10-CM | POA: Diagnosis not present

## 2018-12-05 DIAGNOSIS — G8929 Other chronic pain: Secondary | ICD-10-CM | POA: Insufficient documentation

## 2018-12-05 DIAGNOSIS — K589 Irritable bowel syndrome without diarrhea: Secondary | ICD-10-CM | POA: Insufficient documentation

## 2018-12-05 DIAGNOSIS — Z888 Allergy status to other drugs, medicaments and biological substances status: Secondary | ICD-10-CM | POA: Insufficient documentation

## 2018-12-05 DIAGNOSIS — R509 Fever, unspecified: Secondary | ICD-10-CM | POA: Diagnosis not present

## 2018-12-05 DIAGNOSIS — Z791 Long term (current) use of non-steroidal anti-inflammatories (NSAID): Secondary | ICD-10-CM | POA: Insufficient documentation

## 2018-12-05 DIAGNOSIS — Z823 Family history of stroke: Secondary | ICD-10-CM | POA: Diagnosis not present

## 2018-12-05 MED ORDER — FAMOTIDINE IN NACL 20-0.9 MG/50ML-% IV SOLN
20.0000 mg | Freq: Once | INTRAVENOUS | Status: AC
  Administered 2018-12-06: 20 mg via INTRAVENOUS
  Filled 2018-12-05: qty 50

## 2018-12-05 MED ORDER — DOXEPIN HCL 10 MG PO CAPS
10.0000 mg | ORAL_CAPSULE | Freq: Once | ORAL | Status: AC
  Administered 2018-12-06: 10 mg via ORAL
  Filled 2018-12-05: qty 1

## 2018-12-05 NOTE — ED Provider Notes (Addendum)
[facility]-EMERGENCY DEPT Provider Note   CSN: [id number] Arrival date & time: 12/05/18  2320    History   Chief Complaint Chief Complaint  Patient presents with  . Urticaria    for 2 days   . Fever    started 12 hrs ago ( 103.8) temporal     HPI Jillian Morris is a 73 y.o. female.  The history is provided by the patient.  Urticaria   Fever  She has history of stroke, hypothyroidism, Sjogren's syndrome, fibromyalgia, chronic pain and comes in with a pruritic rash which started yesterday.  Itching does improve when she takes diphenhydramine.  Today, she started running fevers which went as high as 104.  She denies rhinorrhea, sore throat, cough, nausea, vomiting, diarrhea.  She denies urinary symptoms.  She denies any sick contacts.  She denies any recent travel and denies exposure to anybody with COVID-19.  She denies any change in her chronic pain.  She came in by ambulance and was given acetaminophen by EMS but had not taken any cinnamon if and at home.  Curiously, she had run out of her losartan 3 weeks ago and just restarted it yesterday.  She states that she did not have a rash when she took the losartan tablet, but does not recall how long it was between when she took the medication and when the rash started.  Past Medical History:  Diagnosis Date  . Acne rosacea   . Anemia    s/p gastric bypass, malabsorption  . Anxiety   . Arthritis   . B12 deficiency   . Chronic fatigue fibromyalgia syndrome   . Chronic low back pain   . Depression   . Fibromyalgia   . GERD (gastroesophageal reflux disease)   . Graves disease   . H/O hiatal hernia   . Hypothyroidism   . IBS (irritable bowel syndrome)   . Lymphedema   . Meralgia paraesthetica   . Sjogren's syndrome (HCC)   . Status post dilation of esophageal narrowing   . Stroke [facility](HCC)     Patient Active Problem List   Diagnosis Date Noted  . GAD (generalized anxiety disorder) 07/10/2018  . Insomnia  07/10/2018  . Bipolar II disorder (HCC) 04/28/2018  . Sjogren's disease (HCC) 06/29/2017  . DDD (degenerative disc disease), cervical 06/29/2017  . DDD (degenerative disc disease), lumbar 06/29/2017  . Primary osteoarthritis of left knee 06/29/2017  . History of total knee replacement, right 06/29/2017  . Age-related osteoporosis without current pathological fracture 06/29/2017  . Essential tremor 06/29/2017  . History of TIA (transient ischemic attack) 06/29/2017  . 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  . Fall 04/20/2016  . Multiple facial fractures (HCC) 04/20/2016  . Right wrist fracture 04/20/2016  . Closed fracture of right distal radius   . Closed fracture of zygomatic arch (HCC)   . Depression   . Fibromyalgia   . Traumatic subarachnoid bleed with LOC of 1 hour to 5 hours 59 minutes (HCC) 04/16/2016  . Achalasia 08/09/2013  . Stricture and stenosis of esophagus 03/30/2012  . Dysphagia, unspecified(787.20) 03/21/2012  . Special screening for malignant neoplasms, colon 03/21/2012  . Esophageal reflux 03/21/2012    Past Surgical History:  Procedure Laterality Date  . ABDOMINAL HYSTERECTOMY    . APPENDECTOMY    . BALLOON DILATION  03/30/2012   Procedure: BALLOON DILATION;  Surgeon: Louis Meckelobert D Kaplan, MD;  Location: [facility];  Service: Endoscopy;  Laterality: N/A;  . BOTOX INJECTION N/A 12/26/2014  Procedure: BOTOX INJECTION;  Surgeon: Louis Meckel, MD;  Location: [facility];  Service: Endoscopy;  Laterality: N/A;  . ENDOMETRIAL ABLATION     x 4  . ESOPHAGEAL MANOMETRY N/A 05/15/2013   Procedure: ESOPHAGEAL MANOMETRY (EM);  Surgeon: Louis Meckel, MD;  Location: [facility];  Service: Endoscopy;  Laterality: N/A;  . ESOPHAGOGASTRODUODENOSCOPY  03/30/2012   Procedure: ESOPHAGOGASTRODUODENOSCOPY (EGD);  Surgeon: Louis Meckel, MD;  Location: [facility];  Service: Endoscopy;  Laterality: N/A;  . ESOPHAGOGASTRODUODENOSCOPY  05/04/2012   Procedure: ESOPHAGOGASTRODUODENOSCOPY  (EGD);  Surgeon: Louis Meckel, MD;  Location: [facility];  Service: Endoscopy;  Laterality: N/A;  . ESOPHAGOGASTRODUODENOSCOPY N/A 12/26/2014   Procedure: ESOPHAGOGASTRODUODENOSCOPY (EGD);  Surgeon: Louis Meckel, MD;  Location: [facility];  Service: Endoscopy;  Laterality: N/A;  botox injection  . GASTRIC BYPASS    . JOINT REPLACEMENT     right knee   . KNEE ARTHROPLASTY    . LAMINECTOMY     L3-L5  . OPEN REDUCTION INTERNAL FIXATION (ORIF) DISTAL RADIAL FRACTURE Right 04/23/2016   Procedure: OPEN REDUCTION INTERNAL FIXATION (ORIF)  RIGHT DISTAL RADIAL FRACTURE;  Surgeon: Betha Loa, MD;  Location: [facility] OR;  Service: Orthopedics;  Laterality: Right;  . ORIF TRIPOD FRACTURE Right 04/19/2016   Procedure: OPEN REDUCTION INTERNAL FIXATION (ORIF) TRIPOD FRACTURE LIEBINGER MIDFACE NO DRILL;  Surgeon: Newman Pies, MD;  Location: [facility] OR;  Service: ENT;  Laterality: Right;  . 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 THYROIDECTOMY       OB History   No obstetric history on file.      Home Medications    Prior to Admission medications   Medication Sig Start Date End Date Taking? Authorizing Provider  ALPRAZolam Prudy Feeler) 0.5 MG tablet Take 0.5 mg by mouth 2 (two) times daily as needed for anxiety. 09/08/18   [provider]  Brexpiprazole (REXULTI) 0.25 MG TABS Take 0.25 tablets by mouth daily. Patient not taking: Reported on 11/15/2018 07/29/18   Cottle, Steva Ready., MD  Brexpiprazole (REXULTI) 1 MG TABS Take 1 tablet (1 mg total) by mouth daily. Patient not taking: Reported on 11/15/2018 05/31/18   Lauraine Rinne., MD  diclofenac sodium (VOLTAREN) 1 % GEL Apply 2-4 g topically 3 (three) times daily as needed for pain. 09/22/18   [provider]  diphenhydrAMINE (BENADRYL) 25 MG tablet Take 50 mg by mouth every 6 (six) hours as needed for allergies.    [provider]  DULoxetine (CYMBALTA) 60 MG capsule TAKE 2 CAPSULES (120 MG TOTAL) BY MOUTH EVERY EVENING. 09/19/18    Cottle, Steva Ready., MD  hydrocortisone valerate ointment (WEST-CORT) 0.2 % Apply 1 application topically 2 (two) times daily as needed (skin irritation/rash (contact dermatitis)).  08/20/10   [provider]  lamoTRIgine (LAMICTAL) 100 MG tablet TAKE 3 TABLETS (300 MG TOTAL) BY MOUTH AT BEDTIME. 11/14/18   Cottle, Steva Ready., MD  LORazepam (ATIVAN) 1 MG tablet Take 1 mg at bedtime by mouth.    [provider]  losartan (COZAAR) 25 MG tablet Take 50 mg by mouth daily. 08/29/18   [provider]  meclizine (ANTIVERT) 12.5 MG tablet Take 12.5 mg by mouth 3 (three) times daily as needed for dizziness.    [provider]  methocarbamol (ROBAXIN) 750 MG tablet Take 750 mg by mouth every 6 (six) hours as needed for muscle spasms. 09/06/18   [provider]  metroNIDAZOLE (METROCREAM) 0.75 % cream  Apply 1 application topically 2 (two) times daily.  10/06/10   [provider]  mupirocin cream (BACTROBAN) 2 % Apply 1 application topically daily as needed (wound care).  10/06/10   [provider]  ofloxacin (FLOXIN) 0.3 % OTIC solution Place 3 drops into both ears 2 (two) times a week.    [provider]  Oxymetazoline HCl (VICKS SINEX SEVERE NA) Place 1-2 drops into the nose 2 (two) times daily as needed (allergies.).    [provider]  oxymorphone (OPANA) 5 MG tablet Take 5 mg by mouth 5 (five) times daily as needed for pain.     [provider]  rOPINIRole (REQUIP) 1 MG tablet Take 1 mg by mouth at bedtime as needed (restless leg syndrome).  02/22/16   [provider]  SYNTHROID 125 MCG tablet Take 125 mcg by mouth daily before breakfast.  02/17/16   [provider]  tetrahydrozoline 0.05 % ophthalmic solution Place 1 drop into both eyes 3 (three) times daily as needed (dry/irritated eyes.).    [provider]    Family History Family History  Problem Relation Age of Onset  . Prostate cancer  Maternal Grandfather   . Lymphoma Maternal Uncle   . Heart disease Father   . Heart disease Sister   . Irritable bowel syndrome Sister   . Dementia Mother   . Stroke Maternal Grandmother   . Appendicitis Paternal Grandmother   . Colon cancer Neg Hx     Social History Social History   Tobacco Use  . Smoking status: Never Smoker  . Smokeless tobacco: Never Used  Substance Use Topics  . Alcohol use: No    Alcohol/week: 0.0 standard drinks  . Drug use: No     Allergies   Actonel [risedronate sodium]; Ambien [zolpidem tartrate]; Amoxicillin-pot clavulanate; Depakote [divalproex sodium]; Inderal [propranolol]; Nsaids; Penicillins; Silicon; Adhesive [tape]; Latex; and Nickel   Review of Systems Review of Systems  Constitutional: Positive for fever.  All other systems reviewed and are negative.    Physical Exam Updated Vital Signs BP 115/63 (BP Location: Right Arm)   Pulse 86   Temp 99.8 F (37.7 C) (Oral)   Resp 18   SpO2 94%   Physical Exam Vitals signs and nursing note reviewed.    73 year old female, resting comfortably and in no acute distress. Vital signs are normal. Oxygen saturation is 94%, which is normal. Head is normocephalic and atraumatic. PERRLA, EOMI. Oropharynx is clear.  No mucosal lesions are seen. Neck is nontender and supple without adenopathy or JVD. Back is nontender and there is no CVA tenderness. Lungs are clear without rales, wheezes, or rhonchi. Chest is nontender. Heart has regular rate and rhythm with 1-2/6 systolic ejection murmur heard at the lower left sternal border with radiation to the apex. Abdomen is soft, flat, nontender without masses or hepatosplenomegaly and peristalsis is normoactive. Extremities have no cyanosis or edema, full range of motion is present. Skin is warm and dry.  There is a diffuse urticarial rash which is confluent over the trunk. Neurologic: Mental status is normal, cranial nerves are intact, there are no  motor or sensory deficits.  ED Treatments / Results  Labs (all labs ordered are listed, but only abnormal results are displayed) Labs Reviewed  COMPREHENSIVE METABOLIC PANEL - Abnormal; Notable for the following components:      Result Value   Sodium 130 (*)    CO2 21 (*)    Glucose, Bld 119 (*)  BUN 30 (*)    Creatinine, Ser 1.73 (*)    Calcium 8.5 (*)    Total Protein 6.2 (*)    GFR calc non Af Amer 29 (*)    GFR calc Af Amer 33 (*)    All other components within normal limits  CBC WITH DIFFERENTIAL/PLATELET - Abnormal; Notable for the following components:   WBC 3.6 (*)    RBC 3.50 (*)    Hemoglobin 9.5 (*)    HCT 30.6 (*)    Platelets 147 (*)    Lymphs Abs 0.3 (*)    All other components within normal limits  URINALYSIS, ROUTINE W REFLEX MICROSCOPIC - Abnormal; Notable for the following components:   APPearance HAZY (*)    Ketones, ur 5 (*)    Leukocytes,Ua TRACE (*)    Bacteria, UA RARE (*)    All other components within normal limits  CULTURE, BLOOD (ROUTINE X 2)  CULTURE, BLOOD (ROUTINE X 2)  LACTIC ACID, PLASMA    Radiology Dg Chest Port 1 View  Result Date: 12/06/2018 CLINICAL DATA:  73 y/o  F; 48 hours of hives and fever. EXAM: PORTABLE CHEST 1 VIEW COMPARISON:  04/06/2017 chest radiograph FINDINGS: Stable heart size and mediastinal contours are within normal limits. Both lungs are clear. The visualized skeletal structures are unremarkable. IMPRESSION: No active disease. Electronically Signed   By: Mitzi Hansen M.D.   On: 12/06/2018 00:25    Procedures Procedures  Medications Ordered in ED Medications  dexamethasone (DECADRON) injection 10 mg (has no administration in time range)  famotidine (PEPCID) IVPB 20 mg premix (0 mg Intravenous Stopped 12/06/18 0155)  doxepin (SINEQUAN) capsule 10 mg (10 mg Oral Given 12/06/18 0108)  HYDROmorphone (DILAUDID) tablet 2 mg (2 mg Oral Given 12/06/18 0340)  LORazepam (ATIVAN) injection 1 mg (1 mg Intravenous  Given 12/06/18 0510)     Initial Impression / Assessment and Plan / ED Course  I have reviewed the triage vital signs and the nursing notes.  Pertinent labs & imaging results that were available during my care of the patient were reviewed by me and considered in my medical decision making (see chart for details).  Urticaria and fever.  Patient does not appear toxic and she is actually afebrile here.  I suspect that this may be an allergic reaction to her losartan.  Fever can certainly be part of an allergic response.  However, will get full set of cultures including blood and urine and will check chest x-ray.  She had received diphenhydramine in the ambulance coming here.  We will also add doxepin and famotidine.  Old records are reviewed, and she has no relevant past visits.  Labs show normal lactic acid, mild leukopenia, moderate anemia.  Anemia is slightly worse than previous values, but it has been several years since the last recorded hemoglobin.  Also, renal insufficiency is noted with creatinine 1.73 and GFR of 29.  However, even with looking on care everywhere, I cannot find a creatinine more recent than 2 years ago at which time creatinine was mildly elevated at 1.14.  It is unclear if this is an acute change or gradual progression of underlying renal disease.  Mild hyponatremia is noted and not felt to be clinically significant.  She did not get adequate relief of itching with above-noted medication.  She is given a dose of lorazepam with significant improvement.  She is given a dose of dexamethasone and is discharged with prescriptions for doxepin and lorazepam.  Advised  to discontinue losartan and follow-up with PCP in 2 days.  Return precautions discussed.  Of note, she remained afebrile during her ED course and was hemodynamically stable throughout.  Final Clinical Impressions(s) / ED Diagnoses   Final diagnoses:  Urticaria  Renal insufficiency  Normochromic normocytic anemia   Hyponatremia  Altered mental status, unspecified altered mental status type    ED Discharge Orders         Ordered    doxepin (SINEQUAN) 10 MG capsule  3 times daily     12/06/18 0558    LORazepam (ATIVAN) 1 MG tablet  3 times daily PRN     12/06/18 0558           Dione Booze, MD 12/06/18 [phone]  Patient was getting ready for discharge and speech was noted to be incoherent.  She is acutely confused.  Because of this is not clear.  She continues to be afebrile and with normal vital signs.  Will admit for further evaluation.  CT of head is ordered.  Case is discussed with Dr. Blake Divine of [facility] hospitalist, who agrees to admit the patient.   Dione Booze, MD 12/06/18 [id number]    Dione Booze, MD 12/06/18 [phone]

## 2018-12-05 NOTE — ED Triage Notes (Signed)
Pt comes to ed, via ems c/o hives x 2 days, fever started this morning. Temporal 103.8.  Pt comes from home. Pt was going to see her pcp tomorrow but the fever scared her.  V/s 124/64, hr 88, rr16, sp02 95 room air  Denies cough, sob, not been around anyone other her husband.  Ems  Gave 50 mg po benadryl, and 1000mg   Tylenol    No NVD

## 2018-12-05 NOTE — ED Notes (Signed)
Bed: ZV72 Expected date:  Expected time:  Means of arrival:  Comments: 73 yo F/Hives-Fever

## 2018-12-06 ENCOUNTER — Emergency Department (HOSPITAL_COMMUNITY): Payer: Medicare Other

## 2018-12-06 ENCOUNTER — Other Ambulatory Visit: Payer: Self-pay

## 2018-12-06 ENCOUNTER — Encounter (HOSPITAL_COMMUNITY): Payer: Self-pay

## 2018-12-06 DIAGNOSIS — E871 Hypo-osmolality and hyponatremia: Secondary | ICD-10-CM | POA: Diagnosis not present

## 2018-12-06 DIAGNOSIS — D649 Anemia, unspecified: Secondary | ICD-10-CM | POA: Diagnosis not present

## 2018-12-06 DIAGNOSIS — L509 Urticaria, unspecified: Secondary | ICD-10-CM | POA: Diagnosis not present

## 2018-12-06 DIAGNOSIS — R4182 Altered mental status, unspecified: Secondary | ICD-10-CM | POA: Diagnosis not present

## 2018-12-06 DIAGNOSIS — N289 Disorder of kidney and ureter, unspecified: Secondary | ICD-10-CM

## 2018-12-06 LAB — URINALYSIS, ROUTINE W REFLEX MICROSCOPIC
Bilirubin Urine: NEGATIVE
Glucose, UA: NEGATIVE mg/dL
Hgb urine dipstick: NEGATIVE
Ketones, ur: 5 mg/dL — AB
Nitrite: NEGATIVE
Protein, ur: NEGATIVE mg/dL
Specific Gravity, Urine: 1.02 (ref 1.005–1.030)
pH: 5 (ref 5.0–8.0)

## 2018-12-06 LAB — LACTATE DEHYDROGENASE: LDH: 350 U/L — ABNORMAL HIGH (ref 98–192)

## 2018-12-06 LAB — LACTIC ACID, PLASMA: Lactic Acid, Venous: 0.9 mmol/L (ref 0.5–1.9)

## 2018-12-06 LAB — COMPREHENSIVE METABOLIC PANEL
ALT: 15 U/L (ref 0–44)
AST: 28 U/L (ref 15–41)
Albumin: 3.9 g/dL (ref 3.5–5.0)
Alkaline Phosphatase: 88 U/L (ref 38–126)
Anion gap: 9 (ref 5–15)
BUN: 30 mg/dL — ABNORMAL HIGH (ref 8–23)
CO2: 21 mmol/L — ABNORMAL LOW (ref 22–32)
Calcium: 8.5 mg/dL — ABNORMAL LOW (ref 8.9–10.3)
Chloride: 100 mmol/L (ref 98–111)
Creatinine, Ser: 1.73 mg/dL — ABNORMAL HIGH (ref 0.44–1.00)
GFR calc Af Amer: 33 mL/min — ABNORMAL LOW (ref >60)
GFR calc non Af Amer: 29 mL/min — ABNORMAL LOW (ref >60)
Glucose, Bld: 119 mg/dL — ABNORMAL HIGH (ref 70–99)
Potassium: 4.2 mmol/L (ref 3.5–5.1)
Sodium: 130 mmol/L — ABNORMAL LOW (ref 135–145)
Total Bilirubin: 0.8 mg/dL (ref 0.3–1.2)
Total Protein: 6.2 g/dL — ABNORMAL LOW (ref 6.5–8.1)

## 2018-12-06 LAB — CBC WITH DIFFERENTIAL/PLATELET
Abs Immature Granulocytes: 0 10*3/uL (ref 0.00–0.07)
Basophils Absolute: 0 10*3/uL (ref 0.0–0.1)
Basophils Relative: 0 %
Eosinophils Absolute: 0 10*3/uL (ref 0.0–0.5)
Eosinophils Relative: 1 %
HCT: 30.6 % — ABNORMAL LOW (ref 36.0–46.0)
Hemoglobin: 9.5 g/dL — ABNORMAL LOW (ref 12.0–15.0)
Immature Granulocytes: 0 %
Lymphocytes Relative: 8 %
Lymphs Abs: 0.3 10*3/uL — ABNORMAL LOW (ref 0.7–4.0)
MCH: 27.1 pg (ref 26.0–34.0)
MCHC: 31 g/dL (ref 30.0–36.0)
MCV: 87.4 fL (ref 80.0–100.0)
Monocytes Absolute: 0.2 10*3/uL (ref 0.1–1.0)
Monocytes Relative: 6 %
Neutro Abs: 3.1 10*3/uL (ref 1.7–7.7)
Neutrophils Relative %: 85 %
Platelets: 147 10*3/uL — ABNORMAL LOW (ref 150–400)
RBC: 3.5 MIL/uL — ABNORMAL LOW (ref 3.87–5.11)
RDW: 14.3 % (ref 11.5–15.5)
WBC: 3.6 10*3/uL — ABNORMAL LOW (ref 4.0–10.5)
nRBC: 0 % (ref 0.0–0.2)

## 2018-12-06 LAB — IRON AND TIBC
Iron: 16 ug/dL — ABNORMAL LOW (ref 28–170)
Saturation Ratios: 6 % — ABNORMAL LOW (ref 10.4–31.8)
TIBC: 266 ug/dL (ref 250–450)
UIBC: 250 ug/dL

## 2018-12-06 LAB — FIBRINOGEN: Fibrinogen: 407 mg/dL (ref 210–475)

## 2018-12-06 LAB — BASIC METABOLIC PANEL
Anion gap: 10 (ref 5–15)
BUN: 33 mg/dL — ABNORMAL HIGH (ref 8–23)
CO2: 21 mmol/L — ABNORMAL LOW (ref 22–32)
Calcium: 8.2 mg/dL — ABNORMAL LOW (ref 8.9–10.3)
Chloride: 100 mmol/L (ref 98–111)
Creatinine, Ser: 1.69 mg/dL — ABNORMAL HIGH (ref 0.44–1.00)
GFR calc Af Amer: 34 mL/min — ABNORMAL LOW (ref >60)
GFR calc non Af Amer: 29 mL/min — ABNORMAL LOW (ref >60)
Glucose, Bld: 116 mg/dL — ABNORMAL HIGH (ref 70–99)
Potassium: 4.4 mmol/L (ref 3.5–5.1)
Sodium: 131 mmol/L — ABNORMAL LOW (ref 135–145)

## 2018-12-06 LAB — RETICULOCYTES
Immature Retic Fract: 16.2 % — ABNORMAL HIGH (ref 2.3–15.9)
RBC.: 3.21 MIL/uL — ABNORMAL LOW (ref 3.87–5.11)
Retic Count, Absolute: 37.9 10*3/uL (ref 19.0–186.0)
Retic Ct Pct: 1.2 % (ref 0.4–3.1)

## 2018-12-06 LAB — TSH: TSH: 0.25 u[IU]/mL — ABNORMAL LOW (ref 0.350–4.500)

## 2018-12-06 LAB — VITAMIN B12: Vitamin B-12: 180 pg/mL (ref 180–914)

## 2018-12-06 LAB — FERRITIN: Ferritin: 70 ng/mL (ref 11–307)

## 2018-12-06 LAB — FOLATE: Folate: 19.1 ng/mL (ref >5.9)

## 2018-12-06 LAB — D-DIMER, QUANTITATIVE: D-Dimer, Quant: >20 ug/mL-FEU — ABNORMAL HIGH (ref 0.00–0.50)

## 2018-12-06 LAB — SARS CORONAVIRUS 2 BY RT PCR (HOSPITAL ORDER, PERFORMED IN ~~LOC~~ HOSPITAL LAB): SARS Coronavirus 2: NEGATIVE

## 2018-12-06 MED ORDER — LEVOTHYROXINE SODIUM 25 MCG PO TABS
125.0000 ug | ORAL_TABLET | Freq: Every day | ORAL | Status: DC
  Administered 2018-12-06 – 2018-12-07 (×2): 125 ug via ORAL
  Filled 2018-12-06 (×2): qty 1

## 2018-12-06 MED ORDER — MECLIZINE HCL 25 MG PO TABS: 12.5000 mg | ORAL_TABLET | Freq: Three times a day (TID) | ORAL | Status: DC | PRN

## 2018-12-06 MED ORDER — LORAZEPAM 1 MG PO TABS
1.0000 mg | ORAL_TABLET | Freq: Every day | ORAL | Status: DC
  Administered 2018-12-06: 1 mg via ORAL
  Filled 2018-12-06: qty 1

## 2018-12-06 MED ORDER — ONDANSETRON HCL 4 MG/2ML IJ SOLN: 4.0000 mg | Freq: Four times a day (QID) | INTRAMUSCULAR | Status: DC | PRN

## 2018-12-06 MED ORDER — LAMOTRIGINE 100 MG PO TABS
300.0000 mg | ORAL_TABLET | Freq: Every day | ORAL | Status: DC
  Administered 2018-12-06: 300 mg via ORAL
  Filled 2018-12-06: qty 3

## 2018-12-06 MED ORDER — LORAZEPAM 1 MG PO TABS: 1.0000 mg | ORAL_TABLET | Freq: Three times a day (TID) | ORAL | 0 refills | Status: DC | PRN

## 2018-12-06 MED ORDER — DOXEPIN HCL 10 MG PO CAPS: 10.0000 mg | ORAL_CAPSULE | Freq: Three times a day (TID) | ORAL | 0 refills | Status: DC

## 2018-12-06 MED ORDER — DEXAMETHASONE 4 MG PO TABS
6.0000 mg | ORAL_TABLET | Freq: Once | ORAL | Status: AC
  Administered 2018-12-06: 6 mg via ORAL
  Filled 2018-12-06: qty 1

## 2018-12-06 MED ORDER — HYDROXYZINE HCL 25 MG PO TABS: 25.0000 mg | ORAL_TABLET | Freq: Three times a day (TID) | ORAL | Status: DC | PRN

## 2018-12-06 MED ORDER — ACETAMINOPHEN 325 MG PO TABS
650.0000 mg | ORAL_TABLET | Freq: Four times a day (QID) | ORAL | Status: DC | PRN
  Administered 2018-12-07 (×2): 650 mg via ORAL
  Filled 2018-12-06 (×2): qty 2

## 2018-12-06 MED ORDER — PREDNISONE 50 MG PO TABS
60.0000 mg | ORAL_TABLET | Freq: Every day | ORAL | Status: DC
  Administered 2018-12-07: 09:00:00 60 mg via ORAL
  Filled 2018-12-06: qty 1

## 2018-12-06 MED ORDER — SODIUM CHLORIDE 0.9 % IV SOLN
INTRAVENOUS | Status: DC
  Administered 2018-12-06 – 2018-12-07 (×2): via INTRAVENOUS

## 2018-12-06 MED ORDER — LORAZEPAM 2 MG/ML IJ SOLN
1.0000 mg | Freq: Once | INTRAMUSCULAR | Status: AC
  Administered 2018-12-06: 1 mg via INTRAVENOUS
  Filled 2018-12-06: qty 1

## 2018-12-06 MED ORDER — ROPINIROLE HCL 1 MG PO TABS
1.0000 mg | ORAL_TABLET | Freq: Every evening | ORAL | Status: DC | PRN
  Administered 2018-12-07: 1 mg via ORAL
  Filled 2018-12-06: qty 1

## 2018-12-06 MED ORDER — DEXAMETHASONE 4 MG PO TABS
4.0000 mg | ORAL_TABLET | Freq: Once | ORAL | Status: AC
  Administered 2018-12-06: 4 mg via ORAL
  Filled 2018-12-06: qty 1

## 2018-12-06 MED ORDER — HYDROMORPHONE HCL 2 MG PO TABS
2.0000 mg | ORAL_TABLET | Freq: Once | ORAL | Status: AC
  Administered 2018-12-06: 2 mg via ORAL
  Filled 2018-12-06: qty 1

## 2018-12-06 MED ORDER — DULOXETINE HCL 60 MG PO CPEP
120.0000 mg | ORAL_CAPSULE | Freq: Every evening | ORAL | Status: DC
  Administered 2018-12-06: 120 mg via ORAL
  Filled 2018-12-06: qty 2

## 2018-12-06 MED ORDER — ONDANSETRON HCL 4 MG PO TABS: 4.0000 mg | ORAL_TABLET | Freq: Four times a day (QID) | ORAL | Status: DC | PRN

## 2018-12-06 MED ORDER — DEXAMETHASONE SODIUM PHOSPHATE 10 MG/ML IJ SOLN
10.0000 mg | Freq: Once | INTRAMUSCULAR | Status: DC
  Filled 2018-12-06: qty 1

## 2018-12-06 NOTE — ED Notes (Signed)
Verbal provide order decadron 10 mg po / dc iv ( pt pulled iv out )

## 2018-12-06 NOTE — ED Notes (Signed)
ED TO INPATIENT HANDOFF REPORT  ED Nurse Name and Phone #: [phone]  S Name/Age/Gender Jillian Morris 73 y.o. female Room/Bed: WA11/WA11  Code Status   Code Status: Full Code  Home/SNF/Other Home Patient oriented to: self, place, time and situation Is this baseline? Yes   Triage Complete: Triage complete  Chief Complaint urticaria  Triage Note Pt comes to ed, via ems c/o hives x 2 days, fever started this morning. Temporal 103.8.  Pt comes from home. Pt was going to see her pcp tomorrow but the fever scared her.  V/s 124/64, hr 88, rr16, sp02 95 room air  Denies cough, sob, not been around anyone other her husband.  Ems  Gave 50 mg po benadryl, and   Tylenol    No NVD       Allergies Allergies  Allergen Reactions  . Actonel [Risedronate Sodium]     GI Upset  . Ambien [Zolpidem Tartrate] Other (See Comments)    Sleep-walking, hallucinations  . 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 No Did it involve sudden or severe rash/hives, skin peeling, or any reaction on the inside of your mouth or nose? No Did you need to seek medical attention at a hospital or doctor's office? No When did it last happen?5 years If all above answers are "NO", may proceed with cephalosporin use.   . Depakote [Divalproex Sodium]     Hallucinations  . Inderal [Propranolol] Other (See Comments)    Hallucinations   . Nsaids Other (See Comments)    Pt. Had gastric bypass surgery   . Penicillins Other (See Comments)    Did it involve swelling of the face/tongue/throat, SOB, or low BP? Unknown Did it involve sudden or severe rash/hives, skin peeling, or any reaction on the inside of your mouth or nose? Unknown Did you need to seek medical attention at a hospital or doctor's office? Yes When did it last happen? Childhood reaction If all above answers are  "NO", may proceed with cephalosporin use.   . Silicon Nausea And Vomiting    Thyroid storm  . Adhesive [Tape] Itching and Rash    Please use "paper" tape  . Latex Swelling and Rash    Redness lips  . Nickel Itching and Rash    Contact Dermatitis (intolerance)    Level of Care/Admitting Diagnosis ED Disposition    ED Disposition Condition Comment   Admit  Hospital Area: [facility] [[id number]]  Level of Care: Med-Surg [16]  Diagnosis: Altered mental status [780.97.ICD-9-CM]  Admitting Physician: Kathlen Mody [[id number]]  Attending Physician: Kathlen Mody [[id number]]  PT Class (Do Not Modify): Observation [104]  PT Acc Code (Do Not Modify): Observation [10022]       B Medical/Surgery History Past Medical History:  Diagnosis Date  . Acne rosacea   . Anemia    s/p gastric bypass, malabsorption  . Anxiety   . Arthritis   . B12 deficiency   . Chronic fatigue fibromyalgia syndrome   . Chronic low back pain   . Depression   . Fibromyalgia   . GERD (gastroesophageal reflux disease)   . Graves disease   . H/O hiatal hernia   . Hypothyroidism   . IBS (irritable bowel syndrome)   . Lymphedema   . Meralgia paraesthetica   . Sjogren's syndrome (HCC)   . Status post dilation of esophageal narrowing   .  Stroke [facility](HCC)    Past Surgical History:  Procedure Laterality Date  . ABDOMINAL HYSTERECTOMY    . APPENDECTOMY    . BALLOON DILATION  03/30/2012   Procedure: BALLOON DILATION;  Surgeon: Louis Meckelobert D Kaplan, MD;  Location: [facility];  Service: Endoscopy;  Laterality: N/A;  . BOTOX INJECTION N/A 12/26/2014   Procedure: BOTOX INJECTION;  Surgeon: Louis Meckelobert D Kaplan, MD;  Location: [facility];  Service: Endoscopy;  Laterality: N/A;  . ENDOMETRIAL ABLATION     x 4  . ESOPHAGEAL MANOMETRY N/A 05/15/2013   Procedure: ESOPHAGEAL MANOMETRY (EM);  Surgeon: Louis Meckelobert D Kaplan, MD;  Location: [facility];  Service: Endoscopy;  Laterality: N/A;  . ESOPHAGOGASTRODUODENOSCOPY  03/30/2012    Procedure: ESOPHAGOGASTRODUODENOSCOPY (EGD);  Surgeon: Louis Meckelobert D Kaplan, MD;  [facility];  Service: Endoscopy;  Laterality: N/A;  . ESOPHAGOGASTRODUODENOSCOPY  05/04/2012   Procedure: ESOPHAGOGASTRODUODENOSCOPY (EGD);  Surgeon: Louis Meckelobert D Kaplan, MD;  [facility];  Service: Endoscopy;  Laterality: N/A;  . ESOPHAGOGASTRODUODENOSCOPY N/A 12/26/2014   Procedure: ESOPHAGOGASTRODUODENOSCOPY (EGD);  Surgeon: Louis Meckelobert D Kaplan, MD;  [facility];  Service: Endoscopy;  Laterality: N/A;  botox injection  . GASTRIC BYPASS    . JOINT REPLACEMENT     right knee   . KNEE ARTHROPLASTY    . LAMINECTOMY     L3-L5  . OPEN REDUCTION INTERNAL FIXATION (ORIF) DISTAL RADIAL FRACTURE Right 04/23/2016   Procedure: OPEN REDUCTION INTERNAL FIXATION (ORIF)  RIGHT DISTAL RADIAL FRACTURE;  Surgeon: Betha LoaKevin Kuzma, MD;  Location: [facility];  Service: Orthopedics;  Laterality: Right;  . ORIF TRIPOD FRACTURE Right 04/19/2016   Procedure: OPEN REDUCTION INTERNAL FIXATION (ORIF) TRIPOD FRACTURE LIEBINGER MIDFACE NO DRILL;  Surgeon: Newman PiesSu Teoh, MD;  Location: [facility] OR;  Service: ENT;  Laterality: Right;  . 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 THYROIDECTOMY       A IV Location/Drains/Wounds Patient Lines/Drains/Airways Status   Active Line/Drains/Airways    Name:   Placement date:   Placement time:   Site:   Days:   Peripheral IV 12/06/18 Right Wrist   12/06/18    0059    Wrist   less than 1   Incision (Closed) 04/23/16 Hand Right   04/23/16    1843     957          Intake/Output Last 24 hours No intake or output data in the 24 hours ending 12/06/18 [id number]  Labs/Imaging Results for orders placed or performed during the hospital encounter of 12/05/18 (from the past 48 hour(s))  Comprehensive metabolic panel     Status: Abnormal   Collection Time: 12/06/18 12:59 AM  Result Value Ref Range   Sodium 130 (L) 135 - 145 mmol/L   Potassium 4.2 3.5 - 5.1 mmol/L   Chloride 100 98 - 111 mmol/L   CO2 21  (L) 22 - 32 mmol/L   Glucose, Bld 119 (H) 70 - 99 mg/dL   BUN 30 (H) 8 - 23 mg/dL   Creatinine, Ser 1.191.73 (H) 0.44 - 1.00 mg/dL   Calcium 8.5 (L) 8.9 - 10.3 mg/dL   Total Protein 6.2 (L) 6.5 - 8.1 g/dL   Albumin 3.9 3.5 - 5.0 g/dL   AST 28 15 - 41 U/L   ALT 15 0 - 44 U/L   Alkaline Phosphatase 88 38 - 126 U/L   Total Bilirubin 0.8 0.3 - 1.2 mg/dL   GFR calc non Af Amer 29 (L) >60 mL/min   GFR calc Af  Amer 33 (L) >60 mL/min   Anion gap 9 5 - 15    Comment: Performed at [facility], [address] [place], [place] [address]  CBC with Differential     Status: Abnormal   Collection Time: 12/06/18 12:59 AM  Result Value Ref Range   WBC 3.6 (L) 4.0 - 10.5 K/uL   RBC 3.50 (L) 3.87 - 5.11 MIL/uL   Hemoglobin 9.5 (L) 12.0 - 15.0 g/dL   HCT 91.4 (L) 78.2 - 95.6 %   MCV 87.4 80.0 - 100.0 fL   MCH 27.1 26.0 - 34.0 pg   MCHC 31.0 30.0 - 36.0 g/dL   RDW 21.3 08.6 - 57.8 %   Platelets 147 (L) 150 - 400 K/uL   nRBC 0.0 0.0 - 0.2 %   Neutrophils Relative % 85 %   Neutro Abs 3.1 1.7 - 7.7 K/uL   Lymphocytes Relative 8 %   Lymphs Abs 0.3 (L) 0.7 - 4.0 K/uL   Monocytes Relative 6 %   Monocytes Absolute 0.2 0.1 - 1.0 K/uL   Eosinophils Relative 1 %   Eosinophils Absolute 0.0 0.0 - 0.5 K/uL   Basophils Relative 0 %   Basophils Absolute 0.0 0.0 - 0.1 K/uL   Immature Granulocytes 0 %   Abs Immature Granulocytes 0.00 0.00 - 0.07 K/uL    Comment: Performed at [facility], [address] [place], [place] [address]  Lactic acid, plasma     Status: None   Collection Time: 12/06/18 12:59 AM  Result Value Ref Range   Lactic Acid, Venous 0.9 0.5 - 1.9 mmol/L    Comment: Performed at [facility], [address] [place], [place] [address]  Urinalysis, Routine w reflex microscopic     Status: Abnormal   Collection Time: 12/06/18  4:10 AM  Result Value Ref Range   Color, Urine YELLOW YELLOW   APPearance HAZY (A) CLEAR   Specific Gravity,  Urine 1.020 1.005 - 1.030   pH 5.0 5.0 - 8.0   Glucose, UA NEGATIVE NEGATIVE mg/dL   Hgb urine dipstick NEGATIVE NEGATIVE   Bilirubin Urine NEGATIVE NEGATIVE   Ketones, ur 5 (A) NEGATIVE mg/dL   Protein, ur NEGATIVE NEGATIVE mg/dL   Nitrite NEGATIVE NEGATIVE   Leukocytes,Ua TRACE (A) NEGATIVE   RBC / HPF 0-5 0 - 5 RBC/hpf   WBC, UA 0-5 0 - 5 WBC/hpf   Bacteria, UA RARE (A) NONE SEEN   Squamous Epithelial / LPF 0-5 0 - 5   Mucus PRESENT    Hyaline Casts, UA PRESENT     Comment: Performed at [facility], [address] [place], [place] [address]   Ct Head Wo Contrast  Result Date: 12/06/2018 CLINICAL DATA:  Altered mental status. EXAM: CT HEAD WITHOUT CONTRAST TECHNIQUE: Contiguous axial images were obtained from the base of the skull through the vertex without intravenous contrast. COMPARISON:  CT head dated September 13, 2016. FINDINGS: Brain: No evidence of acute infarction, hemorrhage, hydrocephalus, extra-axial collection or mass lesion/mass effect. Unchanged mild atrophy. Vascular: Atherosclerotic vascular calcification of the carotid siphons. No hyperdense vessel. Skull: Normal. Negative for fracture or focal lesion. Sinuses/Orbits: No acute finding. 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 IMPRESSION: 1.  No acute intracranial abnormality. Electronically Signed   By: Obie Dredge M.D.   On: 12/06/2018 07:49   Dg Chest Port 1 View  Result Date: 12/06/2018 CLINICAL  DATA:  73 y/o  F; 48 hours of hives and fever. EXAM: PORTABLE CHEST 1 VIEW COMPARISON:  04/06/2017 chest radiograph FINDINGS: Stable heart size and mediastinal contours are within normal limits. Both lungs are clear. The visualized skeletal structures are unremarkable. IMPRESSION: No active disease. Electronically Signed   By: Mitzi Hansen M.D.   On: 12/06/2018 00:25    Pending Labs Unresulted Labs (From  admission, onward)    Start     Ordered   12/06/18 0740  SARS Coronavirus 2 [facility] order, Performed in [facility] hospital lab)  (Novel Coronavirus, [facility] Order))  Once,   R     12/06/18 0741   12/05/18 2350  Culture, blood (routine x 2)  BLOOD CULTURE X 2,   STAT     12/05/18 2350          Vitals/Pain Today's Vitals   12/06/18 0230 12/06/18 0402 12/06/18 0511 12/06/18 0645  BP: 105/74 99/87 108/73 130/65  Pulse: 84 86 82 (!) 106  Resp: 19 19 19 20   Temp:  98.2 F (36.8 C) 98.3 F (36.8 C)   TempSrc:  Oral Oral   SpO2: 94% 100% 95% 99%  Weight:      Height:      PainSc:  5  5      Isolation Precautions Droplet and Contact precautions  Medications Medications  dexamethasone (DECADRON) injection 10 mg (10 mg Intravenous Not Given 12/06/18 0622)  acetaminophen (TYLENOL) tablet 650 mg (has no administration in time range)  ondansetron (ZOFRAN) tablet 4 mg (has no administration in time range)    Or  ondansetron (ZOFRAN) injection 4 mg (has no administration in time range)  famotidine (PEPCID) IVPB 20 mg premix (0 mg Intravenous Stopped 12/06/18 0155)  doxepin (SINEQUAN) capsule 10 mg (10 mg Oral Given 12/06/18 0108)  HYDROmorphone (DILAUDID) tablet 2 mg (2 mg Oral Given 12/06/18 0340)  LORazepam (ATIVAN) injection 1 mg (1 mg Intravenous Given 12/06/18 0510)  dexamethasone (DECADRON) tablet 4 mg (4 mg Oral Given 12/06/18 0758)  dexamethasone (DECADRON) tablet 6 mg (6 mg Oral Given 12/06/18 0759)    Mobility walks Low fall risk   Focused Assessments NA   R Recommendations: See Admitting Provider Note  Report given to:   Additional Notes:

## 2018-12-06 NOTE — H&P (Signed)
History and Physical    Jillian GoodyBrenda L Games ZOX:[id number] DOB: 10/13/1945 DOA: 12/05/2018  PCP: Johny BlamerHarris, William, MD  Patient coming from: Home.   I have personally briefly reviewed patient's [facility]  Chief Complaint: rash all over the body   HPI: Jillian Morris is a 73 y.o. female with medical history significant of rosacea, Sjogrens' syndrome, depression, GERD, hypothyroidism, h/o sub arachnoid hemorrhage, depression, chronic low back pain, fibromyalgia, anemia of chronic disease, came in for fever of 104, itchy rash since Sunday. Patient reports she was on losartan for hypertension, and did not use it for the last 3 weeks as she ran out of it. She refilled it and took the first dose on Sunday and since then she developed this red itchy rash, and she had a fever of 104 yesterday, without any sob, chest pain, cough, nausea, vomiting , abdominal pain, diarrhea, dysuria,. On arrival to ED, she was afebrile and normotensive. Labs reveal sodium of 130, BUN of 30, creatinine of 1.73, wbc count of 3.6, hemoglobin of 9.5. since patient was diffusely itching she was given a dose of decadron 10 mg , in addition to ativan and doxepin and pepcid after which patient became confused and altered, started pulling her IV lines.   She was referred to medical service for admission for fever with rash, altered mental status, and AKI.    Review of Systems: As per HPI otherwise 10 point review of systems negative.    Past Medical History:  Diagnosis Date  . Acne rosacea   . Anemia    s/p gastric bypass, malabsorption  . Anxiety   . Arthritis   . B12 deficiency   . Chronic fatigue fibromyalgia syndrome   . Chronic low back pain   . Depression   . Fibromyalgia   . GERD (gastroesophageal reflux disease)   . Graves disease   . H/O hiatal hernia   . Hypothyroidism   . IBS (irritable bowel syndrome)   . Lymphedema   . Meralgia paraesthetica   . Sjogren's syndrome (HCC)   .  Status post dilation of esophageal narrowing   . Stroke [facility](HCC)     Past Surgical History:  Procedure Laterality Date  . ABDOMINAL HYSTERECTOMY    . APPENDECTOMY    . BALLOON DILATION  03/30/2012   Procedure: BALLOON DILATION;  Surgeon: Louis Meckelobert D Kaplan, MD;  Location: [facility];  Service: Endoscopy;  Laterality: N/A;  . BOTOX INJECTION N/A 12/26/2014   Procedure: BOTOX INJECTION;  Surgeon: Louis Meckelobert D Kaplan, MD;  Location: [facility];  Service: Endoscopy;  Laterality: N/A;  . ENDOMETRIAL ABLATION     x 4  . ESOPHAGEAL MANOMETRY N/A 05/15/2013   Procedure: ESOPHAGEAL MANOMETRY (EM);  Surgeon: Louis Meckelobert D Kaplan, MD;  Location: [facility];  Service: Endoscopy;  Laterality: N/A;  . ESOPHAGOGASTRODUODENOSCOPY  03/30/2012   Procedure: ESOPHAGOGASTRODUODENOSCOPY (EGD);  Surgeon: Louis Meckelobert D Kaplan, MD;  [facility];  Service: Endoscopy;  Laterality: N/A;  . ESOPHAGOGASTRODUODENOSCOPY  05/04/2012   Procedure: ESOPHAGOGASTRODUODENOSCOPY (EGD);  Surgeon: Louis Meckelobert D Kaplan, MD;  [facility];  Service: Endoscopy;  Laterality: N/A;  . ESOPHAGOGASTRODUODENOSCOPY N/A 12/26/2014   Procedure: ESOPHAGOGASTRODUODENOSCOPY (EGD);  Surgeon: Louis Meckelobert D Kaplan, MD;  [facility];  Service: Endoscopy;  Laterality: N/A;  botox injection  . GASTRIC BYPASS    . JOINT REPLACEMENT     right knee   . KNEE ARTHROPLASTY    . LAMINECTOMY     L3-L5  . OPEN  REDUCTION INTERNAL FIXATION (ORIF) DISTAL RADIAL FRACTURE Right 04/23/2016   Procedure: OPEN REDUCTION INTERNAL FIXATION (ORIF)  RIGHT DISTAL RADIAL FRACTURE;  Surgeon: Betha Loa, MD;  Location: [facility] OR;  Service: Orthopedics;  Laterality: Right;  . ORIF TRIPOD FRACTURE Right 04/19/2016   Procedure: OPEN REDUCTION INTERNAL FIXATION (ORIF) TRIPOD FRACTURE LIEBINGER MIDFACE NO DRILL;  Surgeon: Newman Pies, MD;  Location: [facility] OR;  Service: ENT;  Laterality: Right;  . 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 THYROIDECTOMY       reports that she has never  smoked. She has never used smokeless tobacco. She reports that she does not drink alcohol or use drugs.  Allergies  Allergen Reactions  . Actonel [Risedronate Sodium]     GI Upset  . Ambien [Zolpidem Tartrate] Other (See Comments)    Sleep-walking, hallucinations  . 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 No Did it involve sudden or severe rash/hives, skin peeling, or any reaction on the inside of your mouth or nose? No Did you need to seek medical attention at a hospital or doctor's office? No When did it last happen?5 years If all above answers are "NO", may proceed with cephalosporin use.   . Depakote [Divalproex Sodium]     Hallucinations  . Inderal [Propranolol] Other (See Comments)    Hallucinations   . Nsaids Other (See Comments)    Pt. Had gastric bypass surgery   . Penicillins Other (See Comments)    Did it involve swelling of the face/tongue/throat, SOB, or low BP? Unknown Did it involve sudden or severe rash/hives, skin peeling, or any reaction on the inside of your mouth or nose? Unknown Did you need to seek medical attention at a hospital or doctor's office? Yes When did it last happen? Childhood reaction If all above answers are "NO", may proceed with cephalosporin use.   . Silicon Nausea And Vomiting    Thyroid storm  . Adhesive [Tape] Itching and Rash    Please use "paper" tape  . Latex Swelling and Rash    Redness lips  . Nickel Itching and Rash    Contact Dermatitis (intolerance)    Family History  Problem Relation Age of Onset  . Prostate cancer Maternal Grandfather   . Lymphoma Maternal Uncle   . Heart disease Father   . Heart disease Sister   . Irritable bowel syndrome Sister   . Dementia Mother   . Stroke Maternal Grandmother   . Appendicitis Paternal Grandmother   . Colon cancer Neg Hx     Family history reviewed and  not pertinent   Prior to Admission medications   Medication Sig Start Date End Date Taking? Authorizing Provider  ALPRAZolam Prudy Feeler) 0.5 MG tablet Take 0.5 mg by mouth 2 (two) times daily as needed for anxiety. 09/08/18  Yes [provider]  diclofenac sodium (VOLTAREN) 1 % GEL Apply 2-4 g topically 3 (three) times daily as needed for pain. 09/22/18  Yes [provider]  diphenhydrAMINE (BENADRYL) 25 MG tablet Take 50 mg by mouth every 6 (six) hours as needed for allergies.   Yes [provider]  DULoxetine (CYMBALTA) 60 MG capsule TAKE 2 CAPSULES (120 MG TOTAL) BY MOUTH EVERY EVENING. 09/19/18  Yes Cottle, Steva Ready., MD  hydrocortisone valerate ointment (WEST-CORT) 0.2 % Apply 1 application topically 2 (two) times daily as needed (skin irritation/rash (contact  dermatitis)).  08/20/10  Yes [provider]  lamoTRIgine (LAMICTAL) 100 MG tablet TAKE 3 TABLETS (300 MG TOTAL) BY MOUTH AT BEDTIME. 11/14/18  Yes Cottle, Steva Ready., MD  LORazepam (ATIVAN) 1 MG tablet Take 1 mg at bedtime by mouth.   Yes [provider]  meclizine (ANTIVERT) 12.5 MG tablet Take 12.5 mg by mouth 3 (three) times daily as needed for dizziness.   Yes [provider]  methocarbamol (ROBAXIN) 750 MG tablet Take 750 mg by mouth every 6 (six) hours as needed for muscle spasms. 09/06/18  Yes [provider]  ofloxacin (FLOXIN) 0.3 % OTIC solution Place 3 drops into both ears 2 (two) times a week.   Yes [provider]  Oxymetazoline HCl (VICKS SINEX SEVERE NA) Place 1-2 drops into the nose 2 (two) times daily as needed (allergies).    Yes [provider]  oxymorphone (OPANA) 5 MG tablet Take 5 mg by mouth 5 (five) times daily as needed for pain.    Yes [provider]  rOPINIRole (REQUIP) 1 MG tablet Take 1 mg by mouth at bedtime as needed (restless leg syndrome).  02/22/16  Yes [provider]  SYNTHROID 125 MCG tablet Take 125 mcg by  mouth daily before breakfast.  02/17/16  Yes [provider]  tetrahydrozoline 0.05 % ophthalmic solution Place 1 drop into both eyes 3 (three) times daily as needed (dry/irritated eyes).    Yes [provider]  Brexpiprazole (REXULTI) 0.25 MG TABS Take 0.25 tablets by mouth daily. Patient not taking: Reported on 11/15/2018 07/29/18   Cottle, Steva Ready., MD  Brexpiprazole (REXULTI) 1 MG TABS Take 1 tablet (1 mg total) by mouth daily. Patient not taking: Reported on 11/15/2018 05/31/18   Lauraine Rinne., MD  doxepin (SINEQUAN) 10 MG capsule Take 1 capsule (10 mg total) by mouth 3 (three) times daily. 12/06/18   Dione Booze, MD  LORazepam (ATIVAN) 1 MG tablet Take 1 tablet (1 mg total) by mouth 3 (three) times daily as needed for anxiety (or itching). 12/06/18   Dione Booze, MD    Physical Exam: Vitals:   12/06/18 0830 12/06/18 0835 12/06/18 0839 12/06/18 0925  BP:   133/70 124/81  Pulse: 89 96 94 98  Resp: 19 17 18 16   Temp:   (!) 100.4 F (38 C) 98.8 F (37.1 C)  TempSrc:   Oral Oral  SpO2: 94% 97% 100% 98%  Weight:      Height:        Constitutional: NAD, calm, comfortable Vitals:   12/06/18 0830 12/06/18 0835 12/06/18 0839 12/06/18 0925  BP:   133/70 124/81  Pulse: 89 96 94 98  Resp: 19 17 18 16   Temp:   (!) 100.4 F (38 C) 98.8 F (37.1 C)  TempSrc:   Oral Oral  SpO2: 94% 97% 100% 98%  Weight:      Height:       Eyes: PERRL, lids and conjunctivae normal ENMT: Mucous membranes are dry.   Neck: normal, supple, no masses, no thyromegaly Respiratory: clear to auscultation bilaterally, no wheezing, no crackles. Normal respiratory effort. Cardiovascular: Regular rate and rhythm, no murmurs / rubs / gallops.  Abdomen: no tenderness, no masses palpated. No hepatosplenomegaly. Bowel sounds positive.  Musculoskeletal: no clubbing / cyanosis. No joint deformity upper and lower extremities.  Skin: erythematous , blanching rash over the chest , back and upper  extremities predominantly. Itching .  Neurologic: CN 2-12 grossly intact. Sensation  intact, DTR normal. Strength 5/5 in all 4.  Psychiatric: Normal judgment and insight. Alert and oriented x 3. Normal mood.    Labs on Admission: I have personally reviewed following labs and imaging studies  CBC: Recent Labs  Lab 12/06/18 0059  WBC 3.6*  NEUTROABS 3.1  HGB 9.5*  HCT 30.6*  MCV 87.4  PLT 147*   Basic Metabolic Panel: Recent Labs  Lab 12/06/18 0059  NA 130*  K 4.2  CL 100  CO2 21*  GLUCOSE 119*  BUN 30*  CREATININE 1.73*  CALCIUM 8.5*   GFR: Estimated Creatinine Clearance: 29.5 mL/min (A) (by C-G formula based on SCr of 1.73 mg/dL (H)). Liver Function Tests: Recent Labs  Lab 12/06/18 0059  AST 28  ALT 15  ALKPHOS 88  BILITOT 0.8  PROT 6.2*  ALBUMIN 3.9   No results for input(s): LIPASE, AMYLASE in the last 168 hours. No results for input(s): AMMONIA in the last 168 hours. Coagulation Profile: No results for input(s): INR, PROTIME in the last 168 hours. Cardiac Enzymes: No results for input(s): CKTOTAL, CKMB, CKMBINDEX, TROPONINI in the last 168 hours. BNP (last 3 results) No results for input(s): PROBNP in the last 8760 hours. HbA1C: No results for input(s): HGBA1C in the last 72 hours. CBG: No results for input(s): GLUCAP in the last 168 hours. Lipid Profile: No results for input(s): CHOL, HDL, LDLCALC, TRIG, CHOLHDL, LDLDIRECT in the last 72 hours. Thyroid Function Tests: No results for input(s): TSH, T4TOTAL, FREET4, T3FREE, THYROIDAB in the last 72 hours. Anemia Panel: No results for input(s): VITAMINB12, FOLATE, FERRITIN, TIBC, IRON, RETICCTPCT in the last 72 hours. Urine analysis:    Component Value Date/Time   COLORURINE YELLOW 12/06/2018 0410   APPEARANCEUR HAZY (A) 12/06/2018 0410   LABSPEC 1.020 12/06/2018 0410   PHURINE 5.0 12/06/2018 0410   GLUCOSEU NEGATIVE 12/06/2018 0410   HGBUR NEGATIVE 12/06/2018 0410   BILIRUBINUR NEGATIVE  12/06/2018 0410   KETONESUR 5 (A) 12/06/2018 0410   PROTEINUR NEGATIVE 12/06/2018 0410   UROBILINOGEN 0.2 02/04/2011 1151   NITRITE NEGATIVE 12/06/2018 0410   LEUKOCYTESUR TRACE (A) 12/06/2018 0410    Radiological Exams on Admission: Ct Head Wo Contrast  Result Date: 12/06/2018 CLINICAL DATA:  Altered mental status. EXAM: CT HEAD WITHOUT CONTRAST TECHNIQUE: Contiguous axial images were obtained from the base of the skull through the vertex without intravenous contrast. COMPARISON:  CT head dated September 13, 2016. FINDINGS: Brain: No evidence of acute infarction, hemorrhage, hydrocephalus, extra-axial collection or mass lesion/mass effect. Unchanged mild atrophy. Vascular: Atherosclerotic vascular calcification of the carotid siphons. No hyperdense vessel. Skull: Normal. Negative for fracture or focal lesion. Sinuses/Orbits: No acute finding. 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 IMPRESSION: 1.  No acute intracranial abnormality. Electronically Signed   By: Obie Dredge M.D.   On: 12/06/2018 07:49   Dg Chest Port 1 View  Result Date: 12/06/2018 CLINICAL DATA:  73 y/o  F; 48 hours of hives and fever. EXAM: PORTABLE CHEST 1 VIEW COMPARISON:  04/06/2017 chest radiograph FINDINGS: Stable heart size and mediastinal contours are within normal limits. Both lungs are clear. The visualized skeletal structures are unremarkable. IMPRESSION: No active disease. Electronically Signed   By: Mitzi Hansen M.D.   On: 12/06/2018 00:25    EKG: Independently reviewed.   Assessment/Plan Active Problems:   Altered mental status    Acute encephalopathy : probably metabolic in nature from AKI vs from Ativan  and Dilaudid use. -improving. She is more alert and answering questions and following commands.  - CT head without contrast is negative for acute pathology.  -  get vit b12 and folic acid level and TSH.     Hypertension:  Well controlled.    Erythematous maculopapular blanching rash, itchy: non tender - probably drug reaction from losartan.  - differential include tick borne rash, DIC vs TTS Platelet count of 147. No hypotension noted. Will get LDH, fibrinogen, d dimer, .  - one dose of decadron given by ED.  - prn atarax as pt reports the benadryl is not helping the itching.    Fever,  Lactic acid wnl.  Blood cultures done , and pending.  CXR is negative for pneumonia, UA is negative.  No infectious process evident. She denies any travel or contact with COVID positive patients. She does not have any myalgias, or flu like symptoms at this time. But in view of her altered mental status, fever and lymphopenia, we will test for COVID, and she is low risk for transmission.    Hypothyroidism:  Resume synthroid.  TSH is pending.     Anemia of chronic disease:  Hemoglobin around 9. No obvious signs of bleeding.  Get anemia panel.    AKI with non anion gap mild acidosis probably from dehydration.  Gently hydrate and repeat renal parameters.     Hyponatremia;  Possibly from dehydration .  Repeat sodium levels in am.     DVT prophylaxis:SCD'S Code Status: full code.  Family Communication: none at bedside.  Disposition Plan: pending improvement in the rash.  Consults called: none.  Admission status: obs/med surg.    Kathlen Mody MD [facility] Pager [phone]   If 7PM-7AM, please contact night-coverage [email or web address] Password [facility]  12/06/2018, 9:29 AM

## 2018-12-06 NOTE — ED Notes (Signed)
Report called to Sara, RN.

## 2018-12-06 NOTE — ED Notes (Signed)
Walked into room to update vitals before discharge. Pt found to be naked from waste up with her IV catheter and EKG wires an electrodes pulled off. Pt was talking during obtaining of vitals signs and speech was noted to be different from earlier in her care. Pts RN Cletis Athens and Provider Tonette Lederer made aware.

## 2018-12-06 NOTE — ED Notes (Signed)
Pt placed back on Cardiac monitor.

## 2018-12-06 NOTE — Progress Notes (Signed)
COVID results resulted with negative results. MD paged and orders received to d/c isolation precautions. Melchor Kirchgessner A

## 2018-12-07 DIAGNOSIS — G9341 Metabolic encephalopathy: Secondary | ICD-10-CM

## 2018-12-07 DIAGNOSIS — E039 Hypothyroidism, unspecified: Secondary | ICD-10-CM

## 2018-12-07 LAB — CBC WITH DIFFERENTIAL/PLATELET
Abs Immature Granulocytes: 0.01 10*3/uL (ref 0.00–0.07)
Basophils Absolute: 0 10*3/uL (ref 0.0–0.1)
Basophils Relative: 0 %
Eosinophils Absolute: 0 10*3/uL (ref 0.0–0.5)
Eosinophils Relative: 0 %
HCT: 27 % — ABNORMAL LOW (ref 36.0–46.0)
Hemoglobin: 8.3 g/dL — ABNORMAL LOW (ref 12.0–15.0)
Immature Granulocytes: 0 %
Lymphocytes Relative: 12 %
Lymphs Abs: 0.5 10*3/uL — ABNORMAL LOW (ref 0.7–4.0)
MCH: 27.5 pg (ref 26.0–34.0)
MCHC: 30.7 g/dL (ref 30.0–36.0)
MCV: 89.4 fL (ref 80.0–100.0)
Monocytes Absolute: 0.2 10*3/uL (ref 0.1–1.0)
Monocytes Relative: 5 %
Neutro Abs: 3.6 10*3/uL (ref 1.7–7.7)
Neutrophils Relative %: 83 %
Platelets: 136 10*3/uL — ABNORMAL LOW (ref 150–400)
RBC: 3.02 MIL/uL — ABNORMAL LOW (ref 3.87–5.11)
RDW: 14.4 % (ref 11.5–15.5)
WBC: 4.3 10*3/uL (ref 4.0–10.5)
nRBC: 0 % (ref 0.0–0.2)

## 2018-12-07 LAB — COMPREHENSIVE METABOLIC PANEL
ALT: 18 U/L (ref 0–44)
AST: 25 U/L (ref 15–41)
Albumin: 3.2 g/dL — ABNORMAL LOW (ref 3.5–5.0)
Alkaline Phosphatase: 71 U/L (ref 38–126)
Anion gap: 10 (ref 5–15)
BUN: 34 mg/dL — ABNORMAL HIGH (ref 8–23)
CO2: 19 mmol/L — ABNORMAL LOW (ref 22–32)
Calcium: 8.2 mg/dL — ABNORMAL LOW (ref 8.9–10.3)
Chloride: 106 mmol/L (ref 98–111)
Creatinine, Ser: 1.37 mg/dL — ABNORMAL HIGH (ref 0.44–1.00)
GFR calc Af Amer: 44 mL/min — ABNORMAL LOW (ref >60)
GFR calc non Af Amer: 38 mL/min — ABNORMAL LOW (ref >60)
Glucose, Bld: 106 mg/dL — ABNORMAL HIGH (ref 70–99)
Potassium: 3.9 mmol/L (ref 3.5–5.1)
Sodium: 135 mmol/L (ref 135–145)
Total Bilirubin: 0.5 mg/dL (ref 0.3–1.2)
Total Protein: 5.6 g/dL — ABNORMAL LOW (ref 6.5–8.1)

## 2018-12-07 LAB — APTT: aPTT: 24 seconds (ref 24–36)

## 2018-12-07 LAB — PROTIME-INR
INR: 0.9 (ref 0.8–1.2)
Prothrombin Time: 12.5 seconds (ref 11.4–15.2)

## 2018-12-07 LAB — T4, FREE: Free T4: 0.98 ng/dL (ref 0.82–1.77)

## 2018-12-07 MED ORDER — PREDNISONE 10 MG PO TABS: ORAL_TABLET | ORAL | 0 refills | Status: AC

## 2018-12-07 MED ORDER — MORPHINE SULFATE 15 MG PO TABS
15.0000 mg | ORAL_TABLET | Freq: Four times a day (QID) | ORAL | Status: DC | PRN
  Administered 2018-12-07 (×2): 15 mg via ORAL
  Filled 2018-12-07 (×2): qty 1

## 2018-12-07 MED ORDER — LEVOTHYROXINE SODIUM 112 MCG PO TABS: 112.0000 ug | ORAL_TABLET | Freq: Every day | ORAL | 0 refills | Status: DC

## 2018-12-07 NOTE — TOC Initial Note (Signed)
Transition of Care [facility]) - Initial/Assessment Note    Patient Details  Name: Jillian Morris MRN: [id number] Date of Birth: 22-Jun-1946  [facility] CM/SW Contact:    Geni Bers, RN Phone Number: 12/07/2018, 1:52 PM  Clinical Narrative:                   Expected Discharge Plan: Home/Self Care Barriers to Discharge: No Barriers Identified   Patient Goals and CMS Choice Patient states their goals for this hospitalization and ongoing recovery are:: To get better. CMS Medicare.gov Compare Post Acute Care list provided to:: Patient Choice offered to / list presented to : Patient  Expected Discharge Plan and Services Expected Discharge Plan: Home/Self Care   Discharge Planning Services: CM Consult   Living arrangements for the past 2 months: Single Family Home Expected Discharge Date: 12/07/18                   HH Arranged: Refused HH    Prior Living Arrangements/Services Living arrangements for the past 2 months: Single Family Home Lives with:: Spouse Patient language and need for interpreter reviewed:: No Do you feel safe going back to the place where you live?: Yes               Activities of Daily Living Home Assistive Devices/Equipment: Cane (specify quad or straight), Dentures (specify type), Raised toilet seat with rails ADL Screening (condition at time of admission) Patient's cognitive ability adequate to safely complete daily activities?: Yes Is the patient deaf or have difficulty hearing?: No Does the patient have difficulty seeing, even when wearing glasses/contacts?: No Does the patient have difficulty concentrating, remembering, or making decisions?: No Patient able to express need for assistance with ADLs?: Yes Does the patient have difficulty dressing or bathing?: No Independently performs ADLs?: Yes (appropriate for developmental age) Does the patient have difficulty walking or climbing stairs?: Yes Weakness of Legs:  None Weakness of Arms/Hands: None  Permission Sought/Granted Permission sought to share information with : Case Manager                Emotional Assessment     Affect (typically observed): Accepting Orientation: : Oriented to Self, Oriented to Place, Oriented to  Time, Oriented to Situation      Admission diagnosis:  Hyponatremia [E87.1] Renal insufficiency [N28.9] Normochromic normocytic anemia [D64.9] Urticaria [L50.9] Altered mental status, unspecified altered mental status type [R41.82] Patient Active Problem List   Diagnosis Date Noted  . Altered mental status 12/06/2018  . GAD (generalized anxiety disorder) 07/10/2018  . Insomnia 07/10/2018  . Bipolar II disorder (HCC) 04/28/2018  . Sjogren's disease (HCC) 06/29/2017  . DDD (degenerative disc disease), cervical 06/29/2017  . DDD (degenerative disc disease), lumbar 06/29/2017  . Primary osteoarthritis of left knee 06/29/2017  . History of total knee replacement, right 06/29/2017  . Age-related osteoporosis without current pathological fracture 06/29/2017  . Essential tremor 06/29/2017  . History of TIA (transient ischemic attack) 06/29/2017  . 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  . Fall 04/20/2016  . Multiple facial fractures (HCC) 04/20/2016  . Right wrist fracture 04/20/2016  . Closed fracture of right distal radius   . Closed fracture of zygomatic arch (HCC)   . Depression   . Fibromyalgia   . Traumatic subarachnoid bleed with LOC of 1 hour to 5 hours 59 minutes (HCC) 04/16/2016  . Achalasia 08/09/2013  . Stricture and stenosis of esophagus 03/30/2012  . Dysphagia, unspecified(787.20) 03/21/2012  . Special screening for  malignant neoplasms, colon 03/21/2012  . Esophageal reflux 03/21/2012   PCP:  Johny BlamerHarris, William, MD Pharmacy:   [id number] - [place] - [address] [facility] [address] [place] [id number] Phone: [phone] Fax:  [phone]  [id number] [place], [place] - [address] AT [address] [place] [phone] Phone: [phone] Fax: [phone]     Social Determinants of Health (SDOH) Interventions    Readmission Risk Interventions No flowsheet data found.

## 2018-12-07 NOTE — Discharge Summary (Signed)
Physician Discharge Summary  Jillian Morris ZOX:[id number] DOB: [birth year] DOA: 12/05/2018  PCP: Johny Blamer, MD  Admit date: 12/05/2018 Discharge date: 12/07/2018  Admitted From: Home Disposition: Home  Recommendations for Outpatient Follow-up:  1. Follow up with PCP in 1 week 2. Please obtain BMP/CBC in one week 3. Repeat TSH in 6-12 weeks 4. Please follow up on the following pending results: None  Home Health: None Equipment/Devices: None  Discharge Condition: Stable CODE STATUS: Full code Diet recommendation: Heart healthy   Brief/Interim Summary:  Admission HPI written by Kathlen Mody, MD   Chief Complaint: rash all over the body   HPI: Jillian Morris is a 73 y.o. female with medical history significant of rosacea, Sjogrens' syndrome, depression, GERD, hypothyroidism, h/o sub arachnoid hemorrhage, depression, chronic low back pain, fibromyalgia, anemia of chronic disease, came in for fever of 104, itchy rash since Sunday. Patient reports she was on losartan for hypertension, and did not use it for the last 3 weeks as she ran out of it. She refilled it and took the first dose on Sunday and since then she developed this red itchy rash, and she had a fever of 104 yesterday, without any sob, chest pain, cough, nausea, vomiting , abdominal pain, diarrhea, dysuria,. On arrival to ED, she was afebrile and normotensive. Labs reveal sodium of 130, BUN of 30, creatinine of 1.73, wbc count of 3.6, hemoglobin of 9.5. since patient was diffusely itching she was given a dose of decadron 10 mg , in addition to ativan and doxepin and pepcid after which patient became confused and altered, started pulling her IV lines.   She was referred to medical service for admission for fever with rash, altered mental status, and AKI.   Hospital course:  Acute metabolic encephalopathy Thought secondary to AKI vs possible toxic aspect in setting of opioid and benzodiazepine use. CT head  unremarkable. Recommend discontinuing Xanax on discharge.  Maculopapular rash Secondary to losartan. Given steroids and benadryl with good results. Discharged on steroid taper. Losartan discontinued. Patient with elevated D-dimer likely in setting of allergic reaction. Low pre-test probability for DVT/PE. LDH elevated with normal bilirubin, fibrinogen, INR, PT, PTT. Outpatient follow-up/labs  Fever Low grade. Tested for SARS-Cov-19 which was negative. No cough. No recurrent fever.  Hypothyroidism TSH of 0.250. Discussed with PCP. Decreased to Synthroid 112 mcg daily.  AKI Improved with IV fluids. Baseline of 1. Improved creatinine from 1.37 from 1.73.  Anemia of chronic disease Hemoglobin down slightly in setting of IV fluids. No evidence of bleeding. Schistocytes not mentioned on differential. Reticulocytes normal  Hyponatremia Given IV fluids. Resolved.  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 No Did it involve sudden or severe rash/hives, skin peeling, or any reaction on the inside of your mouth or nose? No  Did you need to seek medical attention at a hospital or doctor's office? No When did it last happen?5 years If all above answers are "NO", may proceed with cephalosporin use.   Depakote [divalproex Sodium]    Hallucinations   Inderal [propranolol] Other (See Comments)    Hallucinations    Nsaids Other (See Comments)   Pt. Had gastric bypass surgery    Penicillins Other (See Comments)   Did it involve swelling of the face/tongue/throat, SOB, or low BP? Unknown Did it involve sudden or severe rash/hives, skin peeling, or any reaction on the inside of your mouth or nose? Unknown Did you need to seek medical attention at a hospital or doctor's office? Yes When did it last happen? Childhood reaction If all above answers are "NO", may proceed with cephalosporin use.   Silicon Nausea And Vomiting   Thyroid storm   Adhesive [tape] Itching, Rash   Please use "paper" tape   Latex Swelling, Rash   Redness lips   Nickel Itching, Rash   Contact Dermatitis (intolerance)      Medication List    STOP taking these medications   ALPRAZolam 0.5 MG tablet Commonly known as:  XANAX   Bactroban 2 % Generic drug:  mupirocin cream   Brexpiprazole 0.25 MG Tabs Commonly known as:  Rexulti   Brexpiprazole 1 MG Tabs Commonly known as:  Rexulti   losartan 25 MG tablet Commonly known as:  COZAAR   metroNIDAZOLE 0.75 % cream Commonly known as:  METROCREAM     TAKE these medications   diclofenac sodium 1 % Gel Commonly known as:  VOLTAREN Apply 2-4 g topically 3 (three) times daily as needed for pain.   diphenhydrAMINE 25 MG tablet Commonly known as:  BENADRYL Take 50 mg by mouth every 6 (six) hours as needed for allergies.   doxepin 10 MG capsule Commonly known as:  SINEQUAN Take 1 capsule (10 mg total) by mouth 3 (three) times daily.   DULoxetine 60 MG capsule Commonly known as:  CYMBALTA TAKE 2 CAPSULES (120 MG TOTAL) BY MOUTH EVERY EVENING.   hydrocortisone valerate ointment 0.2 % Commonly known as:  WEST-CORT Apply 1 application topically 2 (two) times daily as needed (skin irritation/rash (contact dermatitis)).   lamoTRIgine 100 MG tablet Commonly known as:  LAMICTAL TAKE 3 TABLETS (300 MG TOTAL) BY MOUTH AT BEDTIME.   levothyroxine 112  MCG tablet Commonly known as:  Synthroid Take 1 tablet (112 mcg total) by mouth daily at 6 (six) AM. Start taking on:  December 08, 2018 What changed:    medication strength  how much to take  when to take this   LORazepam 1 MG tablet Commonly known as:  ATIVAN Take 1 mg at bedtime by mouth. What changed:  Another medication with the same name was added. Make sure you understand how and when to take each.   LORazepam 1 MG tablet Commonly known as:  Ativan Take 1 tablet (1 mg total) by mouth 3 (three) times daily as needed for anxiety (or itching). What changed:  You were already taking a medication with the same name, and this prescription was added. Make sure you understand how and when to take each.   meclizine 12.5 MG tablet Commonly known as:  ANTIVERT Take 12.5 mg by mouth 3 (three) times daily as needed for dizziness.   methocarbamol 750 MG tablet Commonly known as:  ROBAXIN Take 750 mg by mouth every 6 (six) hours as needed for muscle spasms.  ofloxacin 0.3 % OTIC solution Commonly known as:  FLOXIN Place 3 drops into both ears 2 (two) times a week.   oxymorphone 5 MG tablet Commonly known as:  OPANA Take 5 mg by mouth 5 (five) times daily as needed for pain.   predniSONE 10 MG tablet Commonly known as:  DELTASONE Take 5 tablets (50 mg total) by mouth daily with breakfast for 1 day, THEN 4 tablets (40 mg total) daily with breakfast for 1 day, THEN 3 tablets (30 mg total) daily with breakfast for 1 day, THEN 2 tablets (20 mg total) daily with breakfast for 1 day, THEN 1 tablet (10 mg total) daily with breakfast for 1 day. Start taking on:  December 08, 2018   rOPINIRole 1 MG tablet Commonly known as:  REQUIP Take 1 mg by mouth at bedtime as needed (restless leg syndrome).   tetrahydrozoline 0.05 % ophthalmic solution Place 1 drop into both eyes 3 (three) times daily as needed (dry/irritated eyes).   VICKS SINEX SEVERE NA Place 1-2 drops into the nose 2 (two) times  daily as needed (allergies).      Follow-up Information    Johny Blamer, MD In 2 days.   Specialty:  Family Medicine Contact information: [phone] [address] [place] [address] [phone]          Allergies  Allergen Reactions  . Actonel [Risedronate Sodium]     GI Upset  . Ambien [Zolpidem Tartrate] Other (See Comments)    Sleep-walking, hallucinations  . 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 No Did it involve sudden or severe rash/hives, skin peeling, or any reaction on the inside of your mouth or nose? No Did you need to seek medical attention at a hospital or doctor's office? No When did it last happen?5 years If all above answers are "NO", may proceed with cephalosporin use.   . Depakote [Divalproex Sodium]     Hallucinations  . Inderal [Propranolol] Other (See Comments)    Hallucinations   . Nsaids Other (See Comments)    Pt. Had gastric bypass surgery   . Penicillins Other (See Comments)    Did it involve swelling of the face/tongue/throat, SOB, or low BP? Unknown Did it involve sudden or severe rash/hives, skin peeling, or any reaction on the inside of your mouth or nose? Unknown Did you need to seek medical attention at a hospital or doctor's office? Yes When did it last happen? Childhood reaction If all above answers are "NO", may proceed with cephalosporin use.   . Silicon Nausea And Vomiting    Thyroid storm  . Adhesive [Tape] Itching and Rash    Please use "paper" tape  . Latex Swelling and Rash    Redness lips  . Nickel Itching and Rash    Contact Dermatitis (intolerance)    Consultations:  None   Procedures/Studies: Ct Head Wo Contrast  Result Date: 12/06/2018 CLINICAL DATA:  Altered mental status. EXAM: CT HEAD WITHOUT CONTRAST TECHNIQUE: Contiguous axial images were obtained from the base of  the skull through the vertex without intravenous contrast. COMPARISON:  CT head dated September 13, 2016. FINDINGS: Brain: No evidence of acute infarction, hemorrhage, hydrocephalus, extra-axial collection or mass lesion/mass effect. Unchanged mild atrophy. Vascular: Atherosclerotic vascular calcification of the carotid siphons. No hyperdense vessel. Skull: Normal. Negative for fracture or focal lesion. Sinuses/Orbits: No acute finding. 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 IMPRESSION: 1.  No acute intracranial abnormality. Electronically Signed   By: Obie Dredge M.D.   On: 12/06/2018 07:49   Dg Chest Port 1 View  Result Date: 12/06/2018 CLINICAL DATA:  73 y/o  F; 48 hours of hives and fever. EXAM: PORTABLE CHEST 1 VIEW COMPARISON:  04/06/2017 chest radiograph FINDINGS: Stable heart size and mediastinal contours are within normal limits. Both lungs are clear. The visualized skeletal structures are unremarkable. IMPRESSION: No active disease. Electronically Signed   By: Mitzi Hansen M.D.   On: 12/06/2018 00:25      Subjective: No issues today. Feels baseline  Discharge Exam: Vitals:   12/07/18 0448 12/07/18 1206  BP: 105/77 112/61  Pulse: 79 75  Resp: 16 11  Temp: 97.6 F (36.4 C) 98.1 F (36.7 C)  SpO2: 97% 95%   Vitals:   12/06/18 1532 12/06/18 1943 12/07/18 0448 12/07/18 1206  BP: (!) 122/57 137/71 105/77 112/61  Pulse: 88 94 79 75  Resp: Temp: 97.8 F (36.6 C) 98 F (36.7 C) 97.6 F (36.4 C) 98.1 F (36.7 C)  TempSrc: Oral Oral Oral Oral  SpO2: 95% 97% 97% 95%  Weight:      Height:        General: Pt is alert, awake, not in acute distress Cardiovascular: RRR, S1/S2 +, no rubs, no gallops Respiratory: CTA bilaterally, no wheezing, no rhonchi Abdominal: Soft, NT, ND, bowel sounds + Extremities: no edema, no cyanosis    The results of significant  diagnostics from this hospitalization (including imaging, microbiology, ancillary and laboratory) are listed below for reference.     Microbiology: Recent Results (from the past 240 hour(s))  Culture, blood (routine x 2)     Status: None (Preliminary result)   Collection Time: 12/06/18 12:59 AM  Result Value Ref Range Status   Specimen Description   Final    BLOOD LEFT HAND Performed at [facility], [address] [place], [place] [address]    Special Requests   Final    BOTTLES DRAWN AEROBIC AND ANAEROBIC Blood Culture results may not be optimal due to an excessive volume of blood received in culture bottles Performed at [facility], [address], [place], [place] [address]    Culture   Final    NO GROWTH 1 DAY Performed at [facility], [address] [place], [place] [address]    Report Status PENDING  Incomplete  Culture, blood (routine x 2)     Status: None (Preliminary result)   Collection Time: 12/06/18 12:59 AM  Result Value Ref Range Status   Specimen Description   Final    BLOOD BLOOD RIGHT WRIST Performed at [facility], [address], [place], [place] [address]    Special Requests   Final    BOTTLES DRAWN AEROBIC AND ANAEROBIC Blood Culture adequate volume Performed at [facility], [address] [place], [place] [address]    Culture   Final    NO GROWTH 1 DAY Performed at [facility], [address], [place], [place] [address]    Report Status PENDING  Incomplete  SARS Coronavirus 2 [facility] order, Performed in [facility] hospital lab)     Status: None   Collection Time: 12/06/18 11:18 AM  Result Value Ref Range Status   SARS Coronavirus 2 NEGATIVE NEGATIVE Final  Comment: (NOTE) If result is NEGATIVE SARS-CoV-2 target nucleic acids are NOT DETECTED. The SARS-CoV-2 RNA is generally detectable in upper and lower  respiratory specimens during the acute phase  of infection. The lowest  concentration of SARS-CoV-2 viral copies this assay can detect is 250  copies / mL. A negative result does not preclude SARS-CoV-2 infection  and should not be used as the sole basis for treatment or other  patient management decisions.  A negative result may occur with  improper specimen collection / handling, submission of specimen other  than nasopharyngeal swab, presence of viral mutation(s) within the  areas targeted by this assay, and inadequate number of viral copies  (<250 copies / mL). A negative result must be combined with clinical  observations, patient history, and epidemiological information. If result is POSITIVE SARS-CoV-2 target nucleic acids are DETECTED. The SARS-CoV-2 RNA is generally detectable in upper and lower  respiratory specimens dur ing the acute phase of infection.  Positive  results are indicative of active infection with SARS-CoV-2.  Clinical  correlation with patient history and other diagnostic information is  necessary to determine patient infection status.  Positive results do  not rule out bacterial infection or co-infection with other viruses. If result is PRESUMPTIVE POSTIVE SARS-CoV-2 nucleic acids MAY BE PRESENT.   A presumptive positive result was obtained on the submitted specimen  and confirmed on repeat testing.  While 2019 novel coronavirus  (SARS-CoV-2) nucleic acids may be present in the submitted sample  additional confirmatory testing may be necessary for epidemiological  and / or clinical management purposes  to differentiate between  SARS-CoV-2 and other Sarbecovirus currently known to infect humans.  If clinically indicated additional testing with an alternate test  methodology [phone]([id number]) is advised. The SARS-CoV-2 RNA is generally  detectable in upper and lower respiratory sp ecimens during the acute  phase of infection. The expected result is Negative. Fact Sheet for Patients:   BoilerBrush.com.cyhttps://www.fda.gov/media/136312/download Fact Sheet for Healthcare Providers: https://pope.com/https://www.fda.gov/media/136313/download This test is not yet approved or cleared by the [place] FDA and has been authorized for detection and/or diagnosis of SARS-CoV-2 by FDA under an Emergency Use Authorization (EUA).  This EUA will remain in effect (meaning this test can be used) for the duration of the COVID-19 declaration under Section 564(b)(1) of the Act, 21 U.S.C. section 360bbb-3(b)(1), unless the authorization is terminated or revoked sooner. Performed at [facility], [address] [place], [place] [id number]      Labs: BNP (last 3 results) No results for input(s): BNP in the last 8760 hours. Basic Metabolic Panel: Recent Labs  Lab 12/06/18 0059 12/06/18 1201 12/07/18 0534  NA 130* 131* 135  K 4.2 4.4 3.9  CL 100 100 106  CO2 21* 21* 19*  GLUCOSE 119* 116* 106*  BUN 30* 33* 34*  CREATININE 1.73* 1.69* 1.37*  CALCIUM 8.5* 8.2* 8.2*   Liver Function Tests: Recent Labs  Lab 12/06/18 0059 12/07/18 0534  AST 28 25  ALT 15 18  ALKPHOS 88 71  BILITOT 0.8 0.5  PROT 6.2* 5.6*  ALBUMIN 3.9 3.2*   No results for input(s): LIPASE, AMYLASE in the last 168 hours. No results for input(s): AMMONIA in the last 168 hours. CBC: Recent Labs  Lab 12/06/18 0059 12/07/18 0534  WBC 3.6* 4.3  NEUTROABS 3.1 3.6  HGB 9.5* 8.3*  HCT 30.6* 27.0*  MCV 87.4 89.4  PLT 147* 136*   Cardiac Enzymes: No results for input(s): CKTOTAL, CKMB, CKMBINDEX, TROPONINI  in the last 168 hours. BNP: Invalid input(s): POCBNP CBG: No results for input(s): GLUCAP in the last 168 hours. D-Dimer Recent Labs    12/06/18 1201  DDIMER >20.00*   Hgb A1c No results for input(s): HGBA1C in the last 72 hours. Lipid Profile No results for input(s): CHOL, HDL, LDLCALC, TRIG, CHOLHDL, LDLDIRECT in the last 72 hours. Thyroid function studies Recent Labs    12/06/18 1145  TSH 0.250*   Anemia work  up Recent Labs    12/06/18 1145  VITAMINB12 180  FOLATE 19.1  FERRITIN 70  TIBC 266  IRON 16*  RETICCTPCT 1.2   Urinalysis    Component Value Date/Time   COLORURINE YELLOW 12/06/2018 0410   APPEARANCEUR HAZY (A) 12/06/2018 0410   LABSPEC 1.020 12/06/2018 0410   PHURINE 5.0 12/06/2018 0410   GLUCOSEU NEGATIVE 12/06/2018 0410   HGBUR NEGATIVE 12/06/2018 0410   BILIRUBINUR NEGATIVE 12/06/2018 0410   KETONESUR 5 (A) 12/06/2018 0410   PROTEINUR NEGATIVE 12/06/2018 0410   UROBILINOGEN 0.2 02/04/2011 1151   NITRITE NEGATIVE 12/06/2018 0410   LEUKOCYTESUR TRACE (A) 12/06/2018 0410   Sepsis Labs Invalid input(s): PROCALCITONIN,  WBC,  LACTICIDVEN Microbiology Recent Results (from the past 240 hour(s))  Culture, blood (routine x 2)     Status: None (Preliminary result)   Collection Time: 12/06/18 12:59 AM  Result Value Ref Range Status   Specimen Description   Final    BLOOD LEFT HAND Performed at [facility], [address], [place], [place] [address]    Special Requests   Final    BOTTLES DRAWN AEROBIC AND ANAEROBIC Blood Culture results may not be optimal due to an excessive volume of blood received in culture bottles Performed at [facility], [address] [place], [place] [address]    Culture   Final    NO GROWTH 1 DAY Performed at [facility], [address], [place], [place] [address]    Report Status PENDING  Incomplete  Culture, blood (routine x 2)     Status: None (Preliminary result)   Collection Time: 12/06/18 12:59 AM  Result Value Ref Range Status   Specimen Description   Final    BLOOD BLOOD RIGHT WRIST Performed at [facility], [address], [place], [place] [address]    Special Requests   Final    BOTTLES DRAWN AEROBIC AND ANAEROBIC Blood Culture adequate volume Performed at [facility], [address] [place], [place] [address]    Culture   Final    NO GROWTH  1 DAY Performed at [facility], [address], [place], [place] [address]    Report Status PENDING  Incomplete  SARS Coronavirus 2 [facility] order, Performed in [facility] hospital lab)     Status: None   Collection Time: 12/06/18 11:18 AM  Result Value Ref Range Status   SARS Coronavirus 2 NEGATIVE NEGATIVE Final    Comment: (NOTE) If result is NEGATIVE SARS-CoV-2 target nucleic acids are NOT DETECTED. The SARS-CoV-2 RNA is generally detectable in upper and lower  respiratory specimens during the acute phase of infection. The lowest  concentration of SARS-CoV-2 viral copies this assay can detect is 250  copies / mL. A negative result does not preclude SARS-CoV-2 infection  and should not be used as the sole basis for treatment or other  patient management decisions.  A negative result may occur with  improper specimen collection / handling, submission of  specimen other  than nasopharyngeal swab, presence of viral mutation(s) within the  areas targeted by this assay, and inadequate number of viral copies  (<250 copies / mL). A negative result must be combined with clinical  observations, patient history, and epidemiological information. If result is POSITIVE SARS-CoV-2 target nucleic acids are DETECTED. The SARS-CoV-2 RNA is generally detectable in upper and lower  respiratory specimens dur ing the acute phase of infection.  Positive  results are indicative of active infection with SARS-CoV-2.  Clinical  correlation with patient history and other diagnostic information is  necessary to determine patient infection status.  Positive results do  not rule out bacterial infection or co-infection with other viruses. If result is PRESUMPTIVE POSTIVE SARS-CoV-2 nucleic acids MAY BE PRESENT.   A presumptive positive result was obtained on the submitted specimen  and confirmed on repeat testing.  While 2019 novel coronavirus  (SARS-CoV-2) nucleic acids may be present in the  submitted sample  additional confirmatory testing may be necessary for epidemiological  and / or clinical management purposes  to differentiate between  SARS-CoV-2 and other Sarbecovirus currently known to infect humans.  If clinically indicated additional testing with an alternate test  methodology [phone]) is advised. The SARS-CoV-2 RNA is generally  detectable in upper and lower respiratory sp ecimens during the acute  phase of infection. The expected result is Negative. Fact Sheet for Patients:  BoilerBrush.com.cy Fact Sheet for Healthcare Providers: https://pope.com/ This test is not yet approved or cleared by the [place] FDA and has been authorized for detection and/or diagnosis of SARS-CoV-2 by FDA under an Emergency Use Authorization (EUA).  This EUA will remain in effect (meaning this test can be used) for the duration of the COVID-19 declaration under Section 564(b)(1) of the Act, 21 U.S.C. section 360bbb-3(b)(1), unless the authorization is terminated or revoked sooner. Performed at [facility], [address] [place], [place] [address]     SIGNED:   Jacquelin Hawking, MD [facility] 12/07/2018, 12:28 PM

## 2018-12-07 NOTE — Care Management Obs Status (Signed)
MEDICARE OBSERVATION STATUS NOTIFICATION   Patient Details  Name: Jillian Morris MRN: [id number] Date of Birth: [birth year]   Medicare Observation Status Notification Given:  Yes    Geni Bers, RN 12/07/2018, 1:47 PM

## 2018-12-07 NOTE — Progress Notes (Signed)
Patient slightly confused at times. Ambulating to bathroom, vitals stable.

## 2018-12-07 NOTE — Discharge Instructions (Addendum)
Stop taking losartan - it may be what is causing the rash and itching.  Continue taking diphenhydramine (Benadryl) as needed.  Your creatinine (a blood test of your kidneys) is higher today than it has been. Please have your doctor monitor it closely.  Return if you are having any difficulty breathing or swallowing.  Return if symptoms are not being adequately controlled at home.  Your TSH was low. I discussed this with Dr. Tiburcio PeaHarris and we agreed to decrease your Synthroid to 112 mcg and he will follow-up with you in the office. If you have any worsening leg pain, trouble breathing, chest pain, please return immediately.   Doxepin oral tablets What is this medicine? DOXEPIN (DOX e pin) is used to treat insomnia. This medicine helps you sleep through the night. This medicine may be used for other purposes; ask your health care provider or pharmacist if you have questions. COMMON BRAND NAME(S): Silenor What should I tell my health care provider before I take this medicine? They need to know if you have any of these conditions: -bipolar disorder -depression -difficulty passing urine -glaucoma -heart disease -history of a drug or alcohol abuse problem -liver disease -lung or breathing disease, like asthma or sleep apnea -prostate trouble -schizophrenia -suicidal thoughts, plans, or attempt; a previous suicide attempt by you or a family member -an unusual or allergic reaction to doxepin, other medicines, foods, dyes, or preservatives -pregnant or trying to get pregnant -breast-feeding How should I use this medicine? Take this medicine by mouth with a glass of water. Follow the directions on the prescription label. Take this medicine on an empty stomach, and not within 3 hours of a meal. This medicine should be taken within 30 minutes of going to sleep and only when you are able to get a full night of sleep before you must be active again. Do not take it more often than directed. Do not  stop taking this medicine suddenly except upon the advice of your doctor. Stopping this medicine too quickly may cause serious side effects or your condition may worsen. A special MedGuide will be given to you by the pharmacist with each prescription and refill. Be sure to read this information carefully each time. Talk to your pediatrician regarding the use of this medicine in children. Special care may be needed. Overdosage: If you think you have taken too much of this medicine contact a poison control center or emergency room at once. NOTE: This medicine is only for you. Do not share this medicine with others. What if I miss a dose? This does not apply. This medicine should only be taken before going to sleep. Do not take double or extra doses. What may interact with this medicine? Do not take this medicine with any of the following medications: -arsenic trioxide -certain medicines used to regulate abnormal heartbeat or to treat other heart conditions -cisapride -halofantrine -levomethadyl -linezolid -MAOIs like Carbex, Eldepryl, Marplan, Nardil, and Parnate -methylene blue -other medicines for mental depression -phenothiazines like perphenazine, thioridazine and chlorpromazine -pimozide -procarbazine -sparfloxacin -St. John's Wort -ziprasidone This medicine may also interact with the following medications: -cimetidine -tolazamide This list may not describe all possible interactions. Give your health care provider a list of all the medicines, herbs, non-prescription drugs, or dietary supplements you use. Also tell them if you smoke, drink alcohol, or use illegal drugs. Some items may interact with your medicine. What should I watch for while using this medicine? Visit your doctor or health care professional for regular  checks on your progress. Keep a regular sleep schedule by going to bed at about the same time each night. Avoid caffeine-containing drinks in the evening hours. Talk to  your doctor if you still have trouble sleeping within 7 to 10 days of using this medicine. This may mean there is another cause for your sleep problems. Do not take this medicine unless you are able to get a full night of sleep before you must be active again. After taking this medicine, do not drive, use machinery, or do anything that needs mental alertness. Take this medicine only within 30 minutes of going to bed and then confine your activities to those needed to get ready for sleep. You may still feel drowsy the next day after taking this medicine. If this happens, do not drive, use machinery, or do anything that needs mental alertness until you feel fully awake. Do not stand or sit up quickly, especially if you are an older patient. This reduces the risk of dizzy or fainting spells. After taking this medicine, you may get up out of bed and do an activity that you do not know you are doing. The next morning, you may have no memory of this. Activities include driving a car ("sleep-driving"), making and eating food, talking on the phone, sexual activity, and sleep-walking. Serious injuries have occurred. Call your doctor right away if you find out you have done any of these activities. Do not take this medicine if you have used alcohol that evening. Do not take it if you have taken another medicine for sleep. The risk of doing these sleep-related activities is higher. Patients and their families should watch out for new or worsening thoughts of suicide or depression. Also watch out for sudden changes in feelings such as feeling anxious, agitated, panicky, irritable, hostile, aggressive, impulsive, severely restless, overly excited and hyperactive, or not being able to sleep. If this happens, especially at the beginning of treatment or after a change in dose, call your health care professional. Your mouth may get dry. Chewing sugarless gum or sucking hard candy, and drinking plenty of water may help. Contact  your doctor if the problem does not go away or is severe. This medicine may cause dry eyes and blurred vision. If you wear contact lenses you may feel some discomfort. Lubricating drops may help. See your eye doctor if the problem does not go away or is severe. This medicine can cause constipation. Try to have a bowel movement at least every 2 to 3 days. If you do not have a bowel movement for 3 days, call your doctor or health care professional. This medicine can make you more sensitive to the sun. Keep out of the sun. If you cannot avoid being in the sun, wear protective clothing and use sunscreen. Do not use sun lamps or tanning beds/booths. What side effects may I notice from receiving this medicine? Side effects that you should report to your doctor or health care professional as soon as possible: -allergic reactions like skin rash, itching or hives, swelling of the face, lips, or tongue -breathing problems -changes in vision -confusion -eye pain -fast, irregular heartbeat -feeling faint or lightheaded, falls -fever with increased sweating -seizures -stiff muscles -suicidal thoughts or other mood changes -trouble passing urine or change in the amount of urine -unusual activities while asleep like driving, eating, making phone calls Side effects that usually do not require medical attention (report to your doctor or health care professional if they continue or  are bothersome): -change in appetite or weight -constipation -daytime drowsiness -dry mouth -upset stomach This list may not describe all possible side effects. Call your doctor for medical advice about side effects. You may report side effects to FDA at 1-800-FDA-1088. Where should I keep my medicine? Keep out of the reach of children. Store at room temperature between 20 and 25 degrees C (68 and 77 degrees F). Throw away any unused medicine after the expiration date. NOTE: This sheet is a summary. It may not cover all  possible information. If you have questions about this medicine, talk to your doctor, pharmacist, or health care provider.  2019 [employer] Standard (2018-02-03 11:16:25)   Prednisone tablets What is this medicine? PREDNISONE (PRED ni sone) is a corticosteroid. It is commonly used to treat inflammation of the skin, joints, lungs, and other organs. Common conditions treated include asthma, allergies, and arthritis. It is also used for other conditions, such as blood disorders and diseases of the adrenal glands. This medicine may be used for other purposes; ask your health care provider or pharmacist if you have questions. COMMON BRAND NAME(S): Deltasone, Predone, Sterapred, Sterapred DS What should I tell my health care provider before I take this medicine? They need to know if you have any of these conditions: -Cushing's syndrome -diabetes -glaucoma -heart disease -high blood pressure -infection (especially a virus infection such as chickenpox, cold sores, or herpes) -kidney disease -liver disease -mental illness -myasthenia gravis -osteoporosis -seizures -stomach or intestine problems -thyroid disease -an unusual or allergic reaction to lactose, prednisone, other medicines, foods, dyes, or preservatives -pregnant or trying to get pregnant -breast-feeding How should I use this medicine? Take this medicine by mouth with a glass of water. Follow the directions on the prescription label. Take this medicine with food. If you are taking this medicine once a day, take it in the morning. Do not take more medicine than you are told to take. Do not suddenly stop taking your medicine because you may develop a severe reaction. Your doctor will tell you how much medicine to take. If your doctor wants you to stop the medicine, the dose may be slowly lowered over time to avoid any side effects. Talk to your pediatrician regarding the use of this medicine in children. Special care may be  needed. Overdosage: If you think you have taken too much of this medicine contact a poison control center or emergency room at once. NOTE: This medicine is only for you. Do not share this medicine with others. What if I miss a dose? If you miss a dose, take it as soon as you can. If it is almost time for your next dose, talk to your doctor or health care professional. You may need to miss a dose or take an extra dose. Do not take double or extra doses without advice. What may interact with this medicine? Do not take this medicine with any of the following medications: -metyrapone -mifepristone This medicine may also interact with the following medications: -aminoglutethimide -amphotericin B -aspirin and aspirin-like medicines -barbiturates -certain medicines for diabetes, like glipizide or glyburide -cholestyramine -cholinesterase inhibitors -cyclosporine -digoxin -diuretics -ephedrine -female hormones, like estrogens and birth control pills -isoniazid -ketoconazole -NSAIDS, medicines for pain and inflammation, like ibuprofen or naproxen -phenytoin -rifampin -toxoids -vaccines -warfarin This list may not describe all possible interactions. Give your health care provider a list of all the medicines, herbs, non-prescription drugs, or dietary supplements you use. Also tell them if you smoke, drink alcohol, or use  illegal drugs. Some items may interact with your medicine. What should I watch for while using this medicine? Visit your doctor or health care professional for regular checks on your progress. If you are taking this medicine over a prolonged period, carry an identification card with your name and address, the type and dose of your medicine, and your doctor's name and address. This medicine may increase your risk of getting an infection. Tell your doctor or health care professional if you are around anyone with measles or chickenpox, or if you develop sores or blisters that do  not heal properly. If you are going to have surgery, tell your doctor or health care professional that you have taken this medicine within the last twelve months. Ask your doctor or health care professional about your diet. You may need to lower the amount of salt you eat. This medicine may increase blood sugar. Ask your healthcare provider if changes in diet or medicines are needed if you have diabetes. What side effects may I notice from receiving this medicine? Side effects that you should report to your doctor or health care professional as soon as possible: -allergic reactions like skin rash, itching or hives, swelling of the face, lips, or tongue -changes in emotions or moods -changes in vision -depressed mood -eye pain -fever or chills, cough, sore throat, pain or difficulty passing urine -signs and symptoms of high blood sugar such as being more thirsty or hungry or having to urinate more than normal. You may also feel very tired or have blurry vision. -swelling of ankles, feet Side effects that usually do not require medical attention (report to your doctor or health care professional if they continue or are bothersome): -confusion, excitement, restlessness -headache -nausea, vomiting -skin problems, acne, thin and shiny skin -trouble sleeping -weight gain This list may not describe all possible side effects. Call your doctor for medical advice about side effects. You may report side effects to FDA at 1-800-FDA-1088. Where should I keep my medicine? Keep out of the reach of children. Store at room temperature between 15 and 30 degrees C (59 and 86 degrees F). Protect from light. Keep container tightly closed. Throw away any unused medicine after the expiration date. NOTE: This sheet is a summary. It may not cover all possible information. If you have questions about this medicine, talk to your doctor, pharmacist, or health care provider.  2019 [employer] Standard (2018-05-10  10:54:22)

## 2018-12-08 LAB — HAPTOGLOBIN: Haptoglobin: 214 mg/dL (ref 42–346)

## 2018-12-08 LAB — T3, FREE: T3, Free: 1.4 pg/mL — ABNORMAL LOW (ref 2.0–4.4)

## 2018-12-11 LAB — CULTURE, BLOOD (ROUTINE X 2)
Culture: NO GROWTH
Culture: NO GROWTH
Special Requests: ADEQUATE

## 2018-12-12 ENCOUNTER — Encounter (HOSPITAL_COMMUNITY): Admission: RE | Payer: Self-pay | Source: Home / Self Care

## 2018-12-12 ENCOUNTER — Ambulatory Visit (HOSPITAL_COMMUNITY): Admission: RE | Admit: 2018-12-12 | Payer: Medicare Other | Source: Home / Self Care | Admitting: Orthopedic Surgery

## 2018-12-12 SURGERY — ARTHROPLASTY, KNEE, TOTAL
Anesthesia: Spinal | Laterality: Left

## 2018-12-17 ENCOUNTER — Other Ambulatory Visit: Payer: Self-pay | Admitting: Psychiatry

## 2018-12-17 NOTE — Telephone Encounter (Signed)
Needs to set up appt, last 3 have been cxl

## 2018-12-19 NOTE — Telephone Encounter (Signed)
I sent a note to the admin staff to schedule her and tell her to keep her next appointment.  Under the circumstances okay to go ahead and refill this prescription.  I will take care of that now.

## 2018-12-19 NOTE — Telephone Encounter (Signed)
Okay to fill, last 3 appts have been cxl nothing scheduled

## 2019-01-31 ENCOUNTER — Other Ambulatory Visit: Payer: Self-pay | Admitting: Family Medicine

## 2019-01-31 ENCOUNTER — Ambulatory Visit
Admission: RE | Admit: 2019-01-31 | Discharge: 2019-01-31 | Disposition: A | Discharge status: Home / Self Care | Payer: Medicare Other | Source: Ambulatory Visit | Attending: Family Medicine | Admitting: Family Medicine

## 2019-01-31 ENCOUNTER — Other Ambulatory Visit: Payer: Self-pay

## 2019-01-31 DIAGNOSIS — R091 Pleurisy: Secondary | ICD-10-CM

## 2019-02-13 ENCOUNTER — Other Ambulatory Visit: Payer: Self-pay | Admitting: Psychiatry

## 2019-02-13 ENCOUNTER — Telehealth: Payer: Self-pay

## 2019-02-13 NOTE — Telephone Encounter (Signed)
I called pt. Her referral states difficulty with mobility and memory, both of which will be better assessed in office. Pt is agreeable to this change and I explained [facility]'s COVID policies and procedures. Pt verbalized understanding.

## 2019-02-14 ENCOUNTER — Ambulatory Visit: Payer: Self-pay | Admitting: Neurology

## 2019-02-14 NOTE — Telephone Encounter (Signed)
Pt came for appt at 10:20, advised that the RN will call her to get her r/s

## 2019-02-14 NOTE — Telephone Encounter (Signed)
I called pt, scheduled her for 02/15/19 at 2:30pm, arrival 2pm. Pt verbalized understanding of new appt date and time and [facility] COVID policies and procedures.

## 2019-02-14 NOTE — Telephone Encounter (Signed)
Pt did not show for their appt with Dr. Athar today.  

## 2019-02-15 ENCOUNTER — Ambulatory Visit (INDEPENDENT_AMBULATORY_CARE_PROVIDER_SITE_OTHER): Payer: Medicare Other | Admitting: Neurology

## 2019-02-15 ENCOUNTER — Encounter: Payer: Self-pay | Admitting: Neurology

## 2019-02-15 ENCOUNTER — Other Ambulatory Visit: Payer: Self-pay

## 2019-02-15 VITALS — BP 152/87 | HR 87 | Ht 64.0 in | Wt 183.0 lb

## 2019-02-15 DIAGNOSIS — R413 Other amnesia: Secondary | ICD-10-CM | POA: Diagnosis not present

## 2019-02-15 DIAGNOSIS — R2689 Other abnormalities of gait and mobility: Secondary | ICD-10-CM | POA: Diagnosis not present

## 2019-02-15 NOTE — Progress Notes (Signed)
Subjective:    Patient ID: Jillian Morris is a 73 y.o. female.  HPI     Interim history:   Ms. Jillian Morris is a 73 year-old right-handed woman with an underlying complex medical history of IBS (irritable bowel syndrome), hypothyroidism, chronic fatigue, FMS, meralgia paraesthetica, chronic low back pain, anemia and B12 deficiency, secondary to gastric bypass surgery with malabsorption, eczema, rosacea, anxiety, arthritis, depression, dysphagia, secondary to esophageal stricture (status post dilation of esophageal narrowing), GERD, stroke, Sjogren's syndrome, Graves disease and lymphedema, who presents for a follow-up consultation of her memory issues and worsening mobility, after a long gap of almost 3 years.  She canceled an interim appointment in January 2018 and no showed for an appointment on 11/17/2016 as well as 02/04/2017. She is re-referred by her primary care physician, Dr. Shirline Frees. I last saw her on 03/09/2016, at which time she reported more problems with her memory, confusion while driving and several bouts of vertigo in the interim. She was re-referred by her PCP at the time.  She was having trouble with her thyroid medication and thyroid function. She was getting B12 injections on a monthly basis. She was seeing Dr. Hardin Negus for pain management. She had some falls but thankfully without injuries reported. I had a long chat with her at the time regarding multifactorial gait disorder, fall risk and memory dysfunction. Her MMSE was: 29/30, CDT: 4/4, AFT: 13/min.   I suggested a brain MRI and EEG. Her EEG from 03/12/2016 showed: Impression: This is an essentially normal EEG recording in the waking state. Movement artifact was prominent during the recording. No evidence of ictal or interictal discharges are seen.   We called her with her test results.   She had a brain MRI without contrast on 03/22/2016 which showed: IMPRESSION:  This MRI of the brain without contrast shows the  following: 1.   T2/FLAIR hyperintense foci in the pons and the hemispheres most consistent with chronic microvascular ischemic change. The extent has increased since the MRI from 2009. 2.    Moderate cortical atrophy most pronounced in the mesial temporal lobes. The extent of atrophy has increased when compared to the MRI dated 08/07/2008. 3.    There are no acute findings.   We called her with her test results.  Of note, she fell in the interim. She was hospitalized in August 2017 after a fall with significant injuries. She was in the hospital from 04/16/2016 through 04/20/2016 and then transferred to rehabilitation. She fell at a doctor's office and sustained injuries including right distal radius fracture and traumatic subarachnoid hemorrhage, she had facial injuries including facial Fx, and was seen by ENT, neurosurgery, hand surgery, rehabilitation doctor and ophthalmology. She was on the trauma service. She was advised to follow-up as an outpatient with neurosurgery, ophthalmology, ENT, hand surgery.   She was in rehabilitation inpatient from 04/20/2016 through 05/01/2016.  She had a carotid Doppler ultrasound on 10/13/2016 which showed less than 50% stenosis of bilateral ICAs.   She had a follow-up head CT on 04/17/2016 which I reviewed: IMPRESSION: Stable small volume subarachnoid hemorrhage in the parafalcine right frontal lobe and left quadrigeminal plate cistern.   No new hemorrhage or acute abnormality.  No hydrocephalus.   Today, 02/15/2019: She reports that she has reduced some of her medications on her own.  She is no longer on Xanax.  She does not believe that she had any problems with her kidney function when she went to the hospital recently. Of note,  she is on several potentially sedating medications including meclizine, lorazepam, methocarbamol, ropinirole, oxymorphone, Cymbalta, and Lamictal.  Of note, she was hospitalized in April secondary to rash and fever.  She was noted to  have altered mental status, deemed secondary to acute kidney injury and in the setting of chronic narcotic and benzodiazepine use.  She was advised to discontinue Xanax.  Of note, she was hospitalized in April 2020 secondary to rash and fever.  She was found to have acute kidney injury and was deemed to have acute metabolic encephalopathy secondary to AKI and in the setting of medication effect with opioid and benzodiazepine concomitant use.  I reviewed the hospital records, she was admitted on 12/05/2018 and discharged on 12/07/2018.  Upon discharge her creatinine had improved but was still mildly elevated.  She reports difficulty with dates and remembering short-term.  She has recently seen her pain specialist Dr. Hardin Negus.  She takes Ativan at night for sleep.  She feels that her memory has become worse over the past year.  Her husband supplements the history.  He reports that she sometimes is confused and that her mobility overall and her balance have become worse.  Previously:   I first met her on 06/06/2014, at which time she reported a long-standing history of balance issues and memory issues. Symptoms were progressive. She had fallen in the past and had physical therapy when she had had her knee replacement surgery. She had missed some recent B12 injections per husband's report.    06/06/2014: She reports a longstanding history of balance issues and memory issues. She feels her short-term memory has suffered. She feels her condition has progressed. She does report having fallen in the past. She had physical therapy in the past when she had her knee replacement surgery, but not more recently. Her husband notes that she has missed a couple of B12 injection appointments as I understand.   She had a CT head with without contrast on 08/19/2012: No acute intracranial abnormality. Stable mild atrophy and chronic microvascular change.   She had her lumbar spine MRI without contrast on 07/24/2009: 1. Little  interval change from prior exam with slight progression of left foraminal stenosis at L3-L4 compared to the prior exam of 2009. 2. Laminectomy and posterior decompression extending from L3-L4 through L5-S1. No recurrent central or lateral recess stenosis. Foraminal stenosis as described above. 3. In this patient with right radicular symptoms, question right L4 radiculitis associated with compression in the right L4-L5 neural foramen. She had a brain MRI with and without contrast and a brain MRA without contrast on 03/16/2002 ordered by Dr. Erling Cruz with the indication of confusion, dizziness, syncope and fainting: These were reported as normal.   She had previously seen Dr. Erling Cruz in our office, last in 2009. I reviewed the office note from 07/26/2008, at which time she presented for balance problems and falls. In July and August 2008 she underwent neuropsychological evaluation for complaints of memory problems and test results were in keeping with normal intellectual functioning. Dr. Erling Cruz started her on aspirin. An MMSE score was 30 out of 30 at the time. She had seen Dr. Erling Cruz back in June 2008 for balance problems and falls. Her MMSE was 30 out of 30 at the time, clock drawing was 4 out of 4. He did not notice any significant gait disorder at the time and suspected that in view of her history of gastric bypass, peripheral neuropathy should be suspected and he ordered an EMG and nerve  conduction test as well as EEG. He check TSH, RPR and vitamin B12 level as well as methylmalonic acid. She had an EMG and nerve conduction test on 02/10/2007 which I reviewed. This was done by Dr. 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 She had an EEG on 02/10/2007 which was interpreted as normal EEG during the awake and drowsy states. MRI of the cervical spine from 03/17/2007 showed prominent  spondylolytic changes throughout most noticeable at C5-6 and C6-7 with mild canal stenosis at C5-6 with no significant foraminal stenosis, root encroachment or cord signal abnormality.   Her Past Medical History Is Significant For: Past Medical History:  Diagnosis Date  . Acne rosacea   . Anemia    s/p gastric bypass, malabsorption  . Anxiety   . Arthritis   . B12 deficiency   . Chronic fatigue fibromyalgia syndrome   . Chronic low back pain   . Depression   . Fibromyalgia   . GERD (gastroesophageal reflux disease)   . Graves disease   . H/O hiatal hernia   . Hypothyroidism   . IBS (irritable bowel syndrome)   . Lymphedema   . Meralgia paraesthetica   . Sjogren's syndrome ([place])   . Status post dilation of esophageal narrowing   . Stroke [facility])     Her Past Surgical History Is Significant For: Past Surgical History:  Procedure Laterality Date  . ABDOMINAL HYSTERECTOMY    . APPENDECTOMY    . BALLOON DILATION  03/30/2012   Procedure: BALLOON DILATION;  Surgeon: Inda Castle, MD;  Location: [facility];  Service: Endoscopy;  Laterality: N/A;  . BOTOX INJECTION N/A 12/26/2014   Procedure: BOTOX INJECTION;  Surgeon: Inda Castle, MD;  Location: [facility];  Service: Endoscopy;  Laterality: N/A;  . ENDOMETRIAL ABLATION     x 4  . ESOPHAGEAL MANOMETRY N/A 05/15/2013   Procedure: ESOPHAGEAL MANOMETRY (EM);  Surgeon: Inda Castle, MD;  Location: [facility];  Service: Endoscopy;  Laterality: N/A;  . ESOPHAGOGASTRODUODENOSCOPY  03/30/2012   Procedure: ESOPHAGOGASTRODUODENOSCOPY (EGD);  Surgeon: Inda Castle, MD;  Location: [facility];  Service: Endoscopy;  Laterality: N/A;  . ESOPHAGOGASTRODUODENOSCOPY  05/04/2012   Procedure: ESOPHAGOGASTRODUODENOSCOPY (EGD);  Surgeon: Inda Castle, MD;  Location: [facility];  Service: Endoscopy;  Laterality: N/A;  . ESOPHAGOGASTRODUODENOSCOPY N/A 12/26/2014   Procedure: ESOPHAGOGASTRODUODENOSCOPY (EGD);  Surgeon: Inda Castle, MD;   Location: [facility];  Service: Endoscopy;  Laterality: N/A;  botox injection  . GASTRIC BYPASS    . JOINT REPLACEMENT     right knee   . KNEE ARTHROPLASTY    . LAMINECTOMY     L3-L5  . OPEN REDUCTION INTERNAL FIXATION (ORIF) DISTAL RADIAL FRACTURE Right 04/23/2016   Procedure: OPEN REDUCTION INTERNAL FIXATION (ORIF)  RIGHT DISTAL RADIAL FRACTURE;  Surgeon: Leanora Cover, MD;  Location: [facility];  Service: Orthopedics;  Laterality: Right;  . ORIF TRIPOD FRACTURE Right 04/19/2016   Procedure: OPEN REDUCTION INTERNAL FIXATION (ORIF) TRIPOD FRACTURE LIEBINGER MIDFACE NO DRILL;  Surgeon: Leta Baptist, MD;  Location: [facility];  Service: ENT;  Laterality: Right;  . 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 THYROIDECTOMY      Her Family History Is Significant For: Family History  Problem Relation Age of Onset  . Prostate cancer Maternal Grandfather   . Lymphoma Maternal Uncle   . Heart disease Father   .  Heart disease Sister   . Irritable bowel syndrome Sister   . Dementia Mother   . Stroke Maternal Grandmother   . Appendicitis Paternal Grandmother   . Colon cancer Neg Hx     Her Social History Is Significant For: Social History   Socioeconomic History  . Marital status: Married    Spouse name: Not on file  . Number of children: 0  . Years of education: Not on file  . Highest education level: Not on file  Occupational History  . Occupation: housewife  Social Needs  . Financial resource strain: Not hard at all  . Food insecurity    Worry: Never true    Inability: Never true  . Transportation needs    Medical: No    Non-medical: No  Tobacco Use  . Smoking status: Never Smoker  . Smokeless tobacco: Never Used  Substance and Sexual Activity  . Alcohol use: No    Alcohol/week: 0.0 standard drinks  . Drug use: No  . Sexual activity: Not on file  Lifestyle  . Physical activity    Days per week: 0 days    Minutes per session: 0 min  . Stress: Only a little  Relationships  . Social  connections    Talks on phone: More than three times a week    Gets together: Once a week    Attends religious service: More than 4 times per year    Active member of club or organization: Yes    Attends meetings of clubs or organizations: More than 4 times per year    Relationship status: Married  Other Topics Concern  . Not on file  Social History Narrative   Right handed, Married, Step kids 2, Caffeine 4-5 cups daily, graduate school   Rare caffeine use     Her Allergies Are:  Allergies  Allergen Reactions  . Actonel [Risedronate Sodium]     GI Upset  . Ambien [Zolpidem Tartrate] Other (See Comments)    Sleep-walking, hallucinations  . 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 No Did it involve sudden or severe rash/hives, skin peeling, or any reaction on the inside of your mouth or nose? No Did you need to seek medical attention at a hospital or doctor's office? No When did it last happen?5 years If all above answers are "NO", may proceed with cephalosporin use.   . Depakote [Divalproex Sodium]     Hallucinations  . Inderal [Propranolol] Other (See Comments)    Hallucinations   . Nsaids Other (See Comments)    Pt. Had gastric bypass surgery   . Penicillins Other (See Comments)    Did it involve swelling of the face/tongue/throat, SOB, or low BP? Unknown Did it involve sudden or severe rash/hives, skin peeling, or any reaction on the inside of your mouth or nose? Unknown Did you need to seek medical attention at a hospital or doctor's office? Yes When did it last happen? Childhood reaction If all above answers are "NO", may proceed with cephalosporin use.   . Silicon Nausea And Vomiting    Thyroid storm  . Adhesive [Tape] Itching and Rash    Please use "paper" tape  . Latex Swelling and Rash    Redness lips  . Nickel Itching and Rash     Contact Dermatitis (intolerance)  :   Her Current Medications Are:  Outpatient Encounter Medications as  of 02/15/2019  Medication Sig  . diclofenac sodium (VOLTAREN) 1 % GEL Apply 2-4 g topically 3 (three) times daily as needed for pain.  . diphenhydrAMINE (BENADRYL) 25 MG tablet Take 50 mg by mouth every 6 (six) hours as needed for allergies.  . DULoxetine (CYMBALTA) 60 MG capsule TAKE 2 CAPSULES (120 MG TOTAL) BY MOUTH EVERY EVENING.  . hydrocortisone valerate ointment (WEST-CORT) 0.2 % Apply 1 application topically 2 (two) times daily as needed (skin irritation/rash (contact dermatitis)).   Marland Kitchen lamoTRIgine (LAMICTAL) 100 MG tablet TAKE 3 TABLETS (300 MG TOTAL) BY MOUTH AT BEDTIME.  Marland Kitchen levothyroxine (SYNTHROID) 125 MCG tablet Take 125 mcg by mouth daily before breakfast.  . LORazepam (ATIVAN) 1 MG tablet Take 1 mg at bedtime by mouth.  . meclizine (ANTIVERT) 12.5 MG tablet Take 12.5 mg by mouth 3 (three) times daily as needed for dizziness.  . methocarbamol (ROBAXIN) 750 MG tablet Take 750 mg by mouth every 6 (six) hours as needed for muscle spasms.  Marland Kitchen ofloxacin (FLOXIN) 0.3 % OTIC solution Place 3 drops into both ears 2 (two) times a week.  Marland Kitchen oxymorphone (OPANA) 5 MG tablet Take 5 mg by mouth 5 (five) times daily as needed for pain.   Marland Kitchen rOPINIRole (REQUIP) 1 MG tablet Take 1 mg by mouth at bedtime as needed (restless leg syndrome).   Marland Kitchen tetrahydrozoline 0.05 % ophthalmic solution Place 1 drop into both eyes 3 (three) times daily as needed (dry/irritated eyes).   . [DISCONTINUED] doxepin (SINEQUAN) 10 MG capsule Take 1 capsule (10 mg total) by mouth 3 (three) times daily.  . [DISCONTINUED] levothyroxine (SYNTHROID, LEVOTHROID) 112 MCG tablet Take 1 tablet (112 mcg total) by mouth daily at 6 (six) AM.  . [DISCONTINUED] LORazepam (ATIVAN) 1 MG tablet Take 1 tablet (1 mg total) by mouth 3 (three) times daily as needed for anxiety (or itching).  . [DISCONTINUED] Oxymetazoline HCl (VICKS SINEX SEVERE  NA) Place 1-2 drops into the nose 2 (two) times daily as needed (allergies).    No facility-administered encounter medications on file as of 02/15/2019.   :  Review of Systems:  Out of a complete 14 point review of systems, all are reviewed and negative with the exception of these symptoms as listed below: Review of Systems  Neurological:       Pt presents today to discuss her memory. She feels like it is worsening.    Objective:  Neurological Exam  Physical Exam Physical Examination:   Vitals:   02/15/19 1444  BP: (!) 152/87  Pulse: 87    General Examination: The patient is a very pleasant 73 y.o. female in no acute distress. She appears well-developed and well-nourished and well groomed.   HEENT: Normocephalic, atraumatic, pupils are equal, round and reactive to light and accommodation. She has bilateral cataracts. Funduscopic exam is normal with sharp disc margins noted. Extraocular tracking is good without limitation to gaze excursion or nystagmus noted. Normal smooth pursuit is noted. Hearing is grossly intact. Face is symmetric with normal facial animation and normal facial sensation. Speech is clear with no dysarthria noted. There is no hypophonia. There is no lip, neck/head, jaw or voice tremor. Neck is supple with full range of passive and active motion. There are no carotid bruits on auscultation. Oropharynx exam reveals: moderate mouth dryness, adequate dental hygiene and no significant airway tightness noted. Mallampati is class II.  Chest: Clear to auscultation without wheezing, rhonchi or crackles noted.  Heart: S1+S2+0, regular and normal without murmurs,  rubs or gallops noted.   Abdomen: Soft, non-tender and non-distended with normal bowel sounds appreciated on auscultation.  Extremities: There is nonpitting puffiness of her distal legs bilaterally. Pedal pulses are intact.  Skin: Warm and dry without trophic changes noted. There are no varicose  veins.  Musculoskeletal: exam reveals no obvious joint deformities, tenderness or joint swelling or erythema.   Neurologically:  Mental status: The patient is awake, alert and oriented in all 4 spheres. Her immediate and remote memory, attention, language skills and fund of knowledge are mildly impaired. She Is not able to provide details about her recent hospitalization but otherwise is able to provide her history fairly well.  On 06/06/14: Her MMSE is 28/30.   On 03/09/2016: MMSE: 29/30, CDT: 4/4, AFT: 13/min.   On 02/16/2019: MMSE: 27/30, CDT: 4/4, AFT: 14/min.  There is very mild slowness in thinking noted. Speech shows no hypophonia with normal prosody and enunciation. Thought process is linear. Mood is constricted and affect is blunted, not much different from previous visit.  Cranial nerves II - XII are as described above under HEENT exam. In addition: shoulder shrug is normal with equal shoulder height noted. Motor exam: Normal bulk, strength and tone is noted. There is no drift, No postural or action tremor.  Romberg is not tested for safety reasons, reflexes are 1+ in the upper extremities and trace in the lower extremities Fine motor skills and coordination: are grossly intact.  Cerebellar testing: No dysmetria or intention tremor on finger. There is no truncal or gait ataxia.  Sensory exam: intact to light touch.  Gait, station and balance: She stands with difficulty and pushes herself up. No veering to one side is noted. No leaning to one side is noted. Posture is age-appropriate and stance is slightly wide-based. She walks slowly and cautiously.   Assessment and Plan:   In summary, BREINDEL COLLIER is a 73 year old female with an underlying complex medical history of IBS (irritable bowel syndrome), hypothyroidism, chronic fatigue, FMS, meralgia paraesthetica, chronic low back pain, followed by pain management, on narcotic pain medications, anemia and B12 deficiency,  secondary to gastric bypass surgery with malabsorption history, eczema, rosacea, anxiety, arthritis, depression, dysphagia, secondary to esophageal stricture (status post dilation of esophageal narrowing), GERD, stroke, Sjogren's syndrome, Graves disease and lymphedema, who Presents for follow-up consultation of her memory issues and mobility issues.  I have seen her nearly 3 years ago for a similar problem, MMSE is fairly stable, she had a recent hospitalization for acute encephalopathy in April, In the context of fever and rash. She had previously seen Dr. Erling Cruz in the past, had an MRI brain in 2003 with unremarkable findings, had memory testing with formal cognitive testing in 2008 with normal results at the time, and previous extensive blood work as well, and EMG and nerve conduction testing. Again, I explained to the patient and her husband that her memory issues and balance issues are most likely not rooted in 1 single problem and unfortunately there is no quick fix for her problems either. Supportive care, healthy lifestyle, staying active mentally and physically and streamlining medication will be key. She has reduced some of her medication on her own, she is encouraged to talk to her primary care physician and pain medicine doctor about further reducing potential sedating medications if possible.  We talked about the importance of good hydration.  Her kidney function Was impaired during her recent hospitalization, she is advised to follow-up with her primary care physician regarding  recheck, she reports having an appointment pending.  We will proceed with further evaluation with a brain MRI without contrast and neuropsychological evaluation.  She would [facility].  I made a referral to neuropsychology.She is in regular follow-up with her pain management doctor.  We previously did a brain MRI and EEG in 2017. I will see her back after these tests are completed and we will also keep  them posted via phone call as to the results. I answered all their questions today and the patient and her husband were in agreement with the plan. I spent 40 minutes in total face-to-face time with the patient, more than 50% of which was spent in counseling and coordination of care, reviewing test results, reviewing medication and discussing or reviewing the diagnosis of memory loss and gait disorder, the prognosis and treatment options. Pertinent laboratory and imaging test results that were available during this visit with the patient were reviewed by me and considered in my medical decision making (see chart for details).

## 2019-02-15 NOTE — Patient Instructions (Addendum)
We will seek input from a neuropsychologist regarding your cognitive complaints and short-term memory loss.  Please keep in mind that certain medications can affect your memory.  You are taking several potentially sedating medications.  In addition, chronic kidney impairment can affect your balance and your memory.  We will have Dr. Kenton Kingfisher monitor your labs for this, you have a month thankfully. Please try to stay better hydrated with water, 6 to 8 cups of water per day are recommended, try to reduce your lemonade or soda intake. I would like to pursue a brain MRI, particularly for comparison with your study from 2017.We will do the brain MRI without contrast as your kidney function may not be optimal, it did improve when you are in the hospital in April, by the time you were discharged.  I will make a Referral to physical therapy in [facility] as per your request, I would like for you to be evaluated for your gait and balance problem and potentially for a walker Please talk to your pain management doctor and your primary care physician about reducing further your potentially sedating medications, even though you have reduced your medications recently on your own.

## 2019-02-16 ENCOUNTER — Telehealth: Payer: Self-pay | Admitting: Neurology

## 2019-02-16 NOTE — Telephone Encounter (Signed)
[employer] medicare order sent to GI. No auth they will reach out to the patient to schedule.  

## 2019-02-17 ENCOUNTER — Other Ambulatory Visit: Payer: Self-pay | Admitting: Family Medicine

## 2019-02-17 DIAGNOSIS — Z1231 Encounter for screening mammogram for malignant neoplasm of breast: Secondary | ICD-10-CM

## 2019-03-07 NOTE — Patient Instructions (Addendum)
YOU NEED TO HAVE A COVID 19 TEST ON 03-09-2019 AT 1215 PM, THIS TEST MUST BE DONE BEFORE SURGERY, [facility]. ONCE YOUR COVID TEST IS COMPLETED, PLEASE BEGIN THE QUARANTINE INSTRUCTIONS AS OUTLINED IN YOUR HANDOUT.                Jillian GoodyBrenda L Morris    Your procedure is scheduled on: 03-13-2019  [facility]  Report to SHORT STAY at 530  AM      Call this number if you have problems the morning of surgery [phone]    Remember: BRUSH YOUR TEETH MORNING OF SURGERY AND RINSE YOUR MOUTH OUT, NO CHEWING GUM CANDY OR MINTS.   NO SOLID FOOD AFTER MIDNIGHT THE NIGHT PRIOR TO SURGERY. NOTHING BY MOUTH EXCEPT CLEAR LIQUIDS UNTIL 430 AM. . PLEASE FINISH ENSURE DRINK PER SURGEON ORDER 3 HOURS PRIOR TO SCHEDULED SURGERY TIME WHICH NEEDS TO BE COMPLETED AT 430 AM.  CLEAR LIQUID DIET   Foods Allowed                                                                     Foods Excluded  Coffee and tea, regular and decaf                             liquids that you cannot  Plain Jell-O any favor except red or purple                                           see through such as: Fruit ices (not with fruit pulp)                                     milk, soups, orange juice  Iced Popsicles                                    All solid food Carbonated beverages, regular and diet                                    Cranberry, grape and apple juices Sports drinks like Gatorade Lightly seasoned clear broth or consume(fat free) Sugar, honey syrup  Sample Menu Breakfast                                Lunch                                     Supper Cranberry juice                    Beef broth  Chicken broth Jell-O                                     Grape juice                           Apple juice Coffee or tea                        Jell-O                                      Popsicle                                                 Coffee or tea                        Coffee or tea  _____________________________________________________________________     Take these medicines the morning of surgery with A SIP OF WATER:  DO NOT TAKE ANY DIABETIC MEDICATIONS DAY OF YOUR SURGERY                               You may not have any metal on your body including hair pins and              piercings  Do not wear jewelry, make-up, lotions, powders or perfumes, deodorant             Do not wear nail polish.  Do not shave  48 hours prior to surgery.              Men may shave face and neck.   Do not bring valuables to the hospital. Jillian Morris.  Contacts, dentures or bridgework may not be worn into surgery.  Leave suitcase in the car. After surgery it may be brought to your room.   : _____________________________________________________________________             [facility] - Preparing for Surgery Before surgery, you can play an important role.  Because skin is not sterile, your skin needs to be as free of germs as possible.  You can reduce the number of germs on your skin by washing with CHG (chlorahexidine gluconate) soap before surgery.  CHG is an antiseptic cleaner which kills germs and bonds with the skin to continue killing germs even after washing. Please DO NOT use if you have an allergy to CHG or antibacterial soaps.  If your skin becomes reddened/irritated stop using the CHG and inform your nurse when you arrive at Short Stay. Do not shave (including legs and underarms) for at least 48 hours prior to the first CHG shower.  You may shave your face/neck. Please follow these instructions carefully:  1.  Shower with CHG Soap the night before surgery and the  morning of Surgery.  2.  If you choose to wash your hair, wash your hair first as usual with your  normal  shampoo.  3.  After you shampoo, rinse your hair and body thoroughly to remove the   shampoo.                           4.  Use CHG as you would any other liquid soap.  You can apply chg directly  to the skin and wash                       Gently with a scrungie or clean washcloth.  5.  Apply the CHG Soap to your body ONLY FROM THE NECK DOWN.   Do not use on face/ open                           Wound or open sores. Avoid contact with eyes, ears mouth and genitals (private parts).                       Wash face,  Genitals (private parts) with your normal soap.             6.  Wash thoroughly, paying special attention to the area where your surgery  will be performed.  7.  Thoroughly rinse your body with warm water from the neck down.  8.  DO NOT shower/wash with your normal soap after using and rinsing off  the CHG Soap.                9.  Pat yourself dry with a clean towel.            10.  Wear clean pajamas.            11.  Place clean sheets on your bed the night of your first shower and do not  sleep with pets. Day of Surgery : Do not apply any lotions/deodorants the morning of surgery.  Please wear clean clothes to the hospital/surgery center.  FAILURE TO FOLLOW THESE INSTRUCTIONS MAY RESULT IN THE CANCELLATION OF YOUR SURGERY PATIENT SIGNATURE_________________________________  NURSE SIGNATURE__________________________________  ________________________________________________________________________   Rogelia MireIncentive Spirometer  An incentive spirometer is a tool that can help keep your lungs clear and active. This tool measures how well you are filling your lungs with each breath. Taking long deep breaths may help reverse or decrease the chance of developing breathing (pulmonary) problems (especially infection) following:  A long period of time when you are unable to move or be active. BEFORE THE PROCEDURE   If the spirometer includes an indicator to show your best effort, your nurse or respiratory therapist will set it to a desired goal.  If possible, sit up straight  or lean slightly forward. Try not to slouch.  Hold the incentive spirometer in an upright position. INSTRUCTIONS FOR USE  1. Sit on the edge of your bed if possible, or sit up as far as you can in bed or on a chair. 2. Hold the incentive spirometer in an upright position. 3. Breathe out normally. 4. Place the mouthpiece in your mouth and seal your lips tightly around it. 5. Breathe in slowly and as deeply as possible, raising the piston or the ball toward the top of the column. 6. Hold your breath for 3-5 seconds or for as long as possible. Allow the piston or ball to fall to the bottom of the column. 7. Remove the mouthpiece from your mouth and breathe out normally. 8.  Rest for a few seconds and repeat Steps 1 through 7 at least 10 times every 1-2 hours when you are awake. Take your time and take a few normal breaths between deep breaths. 9. The spirometer may include an indicator to show your best effort. Use the indicator as a goal to work toward during each repetition. 10. After each set of 10 deep breaths, practice coughing to be sure your lungs are clear. If you have an incision (the cut made at the time of surgery), support your incision when coughing by placing a pillow or rolled up towels firmly against it. Once you are able to get out of bed, walk around indoors and cough well. You may stop using the incentive spirometer when instructed by your caregiver.  RISKS AND COMPLICATIONS  Take your time so you do not get dizzy or light-headed.  If you are in pain, you may need to take or ask for pain medication before doing incentive spirometry. It is harder to take a deep breath if you are having pain. AFTER USE  Rest and breathe slowly and easily.  It can be helpful to keep track of a log of your progress. Your caregiver can provide you with a simple table to help with this. If you are using the spirometer at home, follow these instructions: SEEK MEDICAL CARE IF:   You are having  difficultly using the spirometer.  You have trouble using the spirometer as often as instructed.  Your pain medication is not giving enough relief while using the spirometer.  You develop fever of 100.5 F (38.1 C) or higher. SEEK IMMEDIATE MEDICAL CARE IF:   You cough up bloody sputum that had not been present before.  You develop fever of 102 F (38.9 C) or greater.  You develop worsening pain at or near the incision site. MAKE SURE YOU:   Understand these instructions.  Will watch your condition.  Will get help right away if you are not doing well or get worse. Document Released: 12/21/2006 Document Revised: 11/02/2011 Document Reviewed: 02/21/2007 [facility] Patient Information 2014 [place], [place].   ________________________________________________________________________

## 2019-03-07 NOTE — Progress Notes (Signed)
LOV NEUROLOGY DR Rexene Alberts 02-15-19 Epic CHEST XRAY 02-01-19 EPIC

## 2019-03-09 ENCOUNTER — Encounter (HOSPITAL_COMMUNITY)
Admission: RE | Admit: 2019-03-09 | Discharge: 2019-03-09 | Disposition: A | Discharge status: Home / Self Care | Payer: Medicare Other | Source: Ambulatory Visit | Attending: Orthopedic Surgery | Admitting: Orthopedic Surgery

## 2019-03-09 ENCOUNTER — Other Ambulatory Visit: Payer: Self-pay | Admitting: Orthopedic Surgery

## 2019-03-09 ENCOUNTER — Other Ambulatory Visit (HOSPITAL_COMMUNITY): Payer: Medicare Other

## 2019-03-09 NOTE — Patient Instructions (Addendum)
YOU NEED TO HAVE A COVID 19 TEST ON 03-10-2019. ONCE YOUR COVID TEST IS COMPLETED, PLEASE BEGIN THE QUARANTINE INSTRUCTIONS AS OUTLINED IN YOUR HANDOUT.                Jillian GoodyBrenda L Gilmore    Your procedure is scheduled on: 03-13-2019   [facility]    Report to Short Stay  At 5:30  AM      Call this number if you have problems the morning of surgery [phone]     Remember: BRUSH YOUR TEETH MORNING OF SURGERY AND RINSE YOUR MOUTH OUT, NO CHEWING GUM CANDY OR MINTS.   Do not eat food After Midnight. YOU MAY HAVE CLEAR LIQUIDS FROM MIDNIGHT UNTIL 4:15AM. At 4:15AM Please finish the prescribed Pre-Surgery ENSURE drink. Nothing by mouth after you finish the ENSURE drink !   CLEAR LIQUID DIET   Foods Allowed                                                                     Foods Excluded  Coffee and tea, regular and decaf                             liquids that you cannot  Plain Jell-O any favor except red or purple                                           see through such as: Fruit ices (not with fruit pulp)                                     milk, soups, orange juice  Iced Popsicles                                    All solid food Carbonated beverages, regular and diet                                    Cranberry, grape and apple juices Sports drinks like Gatorade Lightly seasoned clear broth or consume(fat free) Sugar, honey syrup  Sample Menu Breakfast                                Lunch                                     Supper Cranberry juice                    Beef broth                            Chicken broth Jell-O  Grape juice                           Apple juice Coffee or tea                        Jell-O                                      Popsicle                                                Coffee or tea                        Coffee or  tea  _____________________________________________________________________     Take these medicines the morning of surgery with A SIP OF WATER: Levothyroxine, Xanax, Meclizine, oxymorphone                                   You may not have any metal on your body including hair pins and              piercings  Do not wear jewelry, make-up, lotions, powders or perfumes, deodorant             Do not wear nail polish.  Do not shave  48 hours prior to surgery.          Do not bring valuables to the hospital. [place].  Contacts, dentures or bridgework may not be worn into surgery.  Leave suitcase in the car. After surgery it may be brought to your room.      _____________________________________________________________________             [facility] - Preparing for Surgery Before surgery, you can play an important role.  Because skin is not sterile, your skin needs to be as free of germs as possible.  You can reduce the number of germs on your skin by washing with CHG (chlorahexidine gluconate) soap before surgery.  CHG is an antiseptic cleaner which kills germs and bonds with the skin to continue killing germs even after washing. Please DO NOT use if you have an allergy to CHG or antibacterial soaps.  If your skin becomes reddened/irritated stop using the CHG and inform your nurse when you arrive at Short Stay. Do not shave (including legs and underarms) for at least 48 hours prior to the first CHG shower.  You may shave your face/neck. Please follow these instructions carefully:  1.  Shower with CHG Soap the night before surgery and the  morning of Surgery.  2.  If you choose to wash your hair, wash your hair first as usual with your  normal  shampoo.  3.  After you shampoo, rinse your hair and body thoroughly to remove the  shampoo.                           4.  Use CHG as you would any other  liquid soap.  You can apply chg directly  to  the skin and wash                       Gently with a scrungie or clean washcloth.  5.  Apply the CHG Soap to your body ONLY FROM THE NECK DOWN.   Do not use on face/ open                           Wound or open sores. Avoid contact with eyes, ears mouth and genitals (private parts).                       Wash face,  Genitals (private parts) with your normal soap.             6.  Wash thoroughly, paying special attention to the area where your surgery  will be performed.  7.  Thoroughly rinse your body with warm water from the neck down.  8.  DO NOT shower/wash with your normal soap after using and rinsing off  the CHG Soap.                9.  Pat yourself dry with a clean towel.            10.  Wear clean pajamas.            11.  Place clean sheets on your bed the night of your first shower and do not  sleep with pets. Day of Surgery : Do not apply any lotions/deodorants the morning of surgery.  Please wear clean clothes to the hospital/surgery center.  FAILURE TO FOLLOW THESE INSTRUCTIONS MAY RESULT IN THE CANCELLATION OF YOUR SURGERY PATIENT SIGNATURE_________________________________  NURSE SIGNATURE__________________________________  ________________________________________________________________________   Rogelia MireIncentive Spirometer  An incentive spirometer is a tool that can help keep your lungs clear and active. This tool measures how well you are filling your lungs with each breath. Taking long deep breaths may help reverse or decrease the chance of developing breathing (pulmonary) problems (especially infection) following:  A long period of time when you are unable to move or be active. BEFORE THE PROCEDURE   If the spirometer includes an indicator to show your best effort, your nurse or respiratory therapist will set it to a desired goal.  If possible, sit up straight or lean slightly forward. Try not to slouch.  Hold the incentive spirometer in an upright position. INSTRUCTIONS  FOR USE  1. Sit on the edge of your bed if possible, or sit up as far as you can in bed or on a chair. 2. Hold the incentive spirometer in an upright position. 3. Breathe out normally. 4. Place the mouthpiece in your mouth and seal your lips tightly around it. 5. Breathe in slowly and as deeply as possible, raising the piston or the ball toward the top of the column. 6. Hold your breath for 3-5 seconds or for as long as possible. Allow the piston or ball to fall to the bottom of the column. 7. Remove the mouthpiece from your mouth and breathe out normally. 8. Rest for a few seconds and repeat Steps 1 through 7 at least 10 times every 1-2 hours when you are awake. Take your time and take a few normal breaths between deep breaths. 9. The spirometer may include an indicator to show your best effort. Use the indicator as a  goal to work toward during each repetition. 10. After each set of 10 deep breaths, practice coughing to be sure your lungs are clear. If you have an incision (the cut made at the time of surgery), support your incision when coughing by placing a pillow or rolled up towels firmly against it. Once you are able to get out of bed, walk around indoors and cough well. You may stop using the incentive spirometer when instructed by your caregiver.  RISKS AND COMPLICATIONS  Take your time so you do not get dizzy or light-headed.  If you are in pain, you may need to take or ask for pain medication before doing incentive spirometry. It is harder to take a deep breath if you are having pain. AFTER USE  Rest and breathe slowly and easily.  It can be helpful to keep track of a log of your progress. Your caregiver can provide you with a simple table to help with this. If you are using the spirometer at home, follow these instructions: SEEK MEDICAL CARE IF:   You are having difficultly using the spirometer.  You have trouble using the spirometer as often as instructed.  Your pain  medication is not giving enough relief while using the spirometer.  You develop fever of 100.5 F (38.1 C) or higher. SEEK IMMEDIATE MEDICAL CARE IF:   You cough up bloody sputum that had not been present before.  You develop fever of 102 F (38.9 C) or greater.  You develop worsening pain at or near the incision site. MAKE SURE YOU:   Understand these instructions.  Will watch your condition.  Will get help right away if you are not doing well or get worse. Document Released: 12/21/2006 Document Revised: 11/02/2011 Document Reviewed: 02/21/2007 [facility] Patient Information 2014 [place], [place].   ________________________________________________________________________

## 2019-03-10 ENCOUNTER — Other Ambulatory Visit: Payer: Self-pay

## 2019-03-10 ENCOUNTER — Other Ambulatory Visit (HOSPITAL_COMMUNITY)
Admission: RE | Admit: 2019-03-10 | Discharge: 2019-03-10 | Disposition: A | Discharge status: Home / Self Care | Payer: Medicare Other | Source: Ambulatory Visit | Attending: Orthopedic Surgery | Admitting: Orthopedic Surgery

## 2019-03-10 ENCOUNTER — Encounter (HOSPITAL_COMMUNITY)
Admission: RE | Admit: 2019-03-10 | Discharge: 2019-03-10 | Disposition: A | Discharge status: Home / Self Care | Payer: Medicare Other | Source: Ambulatory Visit | Attending: Orthopedic Surgery | Admitting: Orthopedic Surgery

## 2019-03-10 ENCOUNTER — Encounter (HOSPITAL_COMMUNITY): Payer: Self-pay

## 2019-03-10 DIAGNOSIS — E039 Hypothyroidism, unspecified: Secondary | ICD-10-CM | POA: Diagnosis not present

## 2019-03-10 DIAGNOSIS — M797 Fibromyalgia: Secondary | ICD-10-CM | POA: Diagnosis not present

## 2019-03-10 DIAGNOSIS — M545 Low back pain: Secondary | ICD-10-CM | POA: Diagnosis not present

## 2019-03-10 DIAGNOSIS — Z79891 Long term (current) use of opiate analgesic: Secondary | ICD-10-CM | POA: Diagnosis not present

## 2019-03-10 DIAGNOSIS — R296 Repeated falls: Secondary | ICD-10-CM | POA: Diagnosis not present

## 2019-03-10 DIAGNOSIS — Z79899 Other long term (current) drug therapy: Secondary | ICD-10-CM | POA: Diagnosis not present

## 2019-03-10 DIAGNOSIS — M3500 Sicca syndrome, unspecified: Secondary | ICD-10-CM | POA: Diagnosis not present

## 2019-03-10 DIAGNOSIS — K449 Diaphragmatic hernia without obstruction or gangrene: Secondary | ICD-10-CM | POA: Diagnosis not present

## 2019-03-10 DIAGNOSIS — Z880 Allergy status to penicillin: Secondary | ICD-10-CM | POA: Diagnosis not present

## 2019-03-10 DIAGNOSIS — G2581 Restless legs syndrome: Secondary | ICD-10-CM | POA: Diagnosis not present

## 2019-03-10 DIAGNOSIS — I10 Essential (primary) hypertension: Secondary | ICD-10-CM | POA: Insufficient documentation

## 2019-03-10 DIAGNOSIS — Z888 Allergy status to other drugs, medicaments and biological substances status: Secondary | ICD-10-CM | POA: Diagnosis not present

## 2019-03-10 DIAGNOSIS — Z01818 Encounter for other preprocedural examination: Secondary | ICD-10-CM | POA: Insufficient documentation

## 2019-03-10 DIAGNOSIS — F3181 Bipolar II disorder: Secondary | ICD-10-CM | POA: Diagnosis not present

## 2019-03-10 DIAGNOSIS — F411 Generalized anxiety disorder: Secondary | ICD-10-CM | POA: Diagnosis not present

## 2019-03-10 DIAGNOSIS — M1712 Unilateral primary osteoarthritis, left knee: Secondary | ICD-10-CM | POA: Insufficient documentation

## 2019-03-10 DIAGNOSIS — E0500 Thyrotoxicosis with diffuse goiter without thyrotoxic crisis or storm: Secondary | ICD-10-CM | POA: Diagnosis not present

## 2019-03-10 DIAGNOSIS — Z886 Allergy status to analgesic agent status: Secondary | ICD-10-CM | POA: Diagnosis not present

## 2019-03-10 DIAGNOSIS — Z1159 Encounter for screening for other viral diseases: Secondary | ICD-10-CM | POA: Insufficient documentation

## 2019-03-10 DIAGNOSIS — Z9884 Bariatric surgery status: Secondary | ICD-10-CM | POA: Diagnosis not present

## 2019-03-10 DIAGNOSIS — Z8673 Personal history of transient ischemic attack (TIA), and cerebral infarction without residual deficits: Secondary | ICD-10-CM | POA: Diagnosis not present

## 2019-03-10 DIAGNOSIS — Z96651 Presence of right artificial knee joint: Secondary | ICD-10-CM | POA: Diagnosis not present

## 2019-03-10 HISTORY — DX: Dysphagia, unspecified: R13.10

## 2019-03-10 HISTORY — DX: Essential (primary) hypertension: I10

## 2019-03-10 HISTORY — DX: Repeated falls: R29.6

## 2019-03-10 LAB — CBC WITH DIFFERENTIAL/PLATELET
Abs Immature Granulocytes: 0.02 10*3/uL (ref 0.00–0.07)
Basophils Absolute: 0.1 10*3/uL (ref 0.0–0.1)
Basophils Relative: 1 %
Eosinophils Absolute: 0.1 10*3/uL (ref 0.0–0.5)
Eosinophils Relative: 2 %
HCT: 33 % — ABNORMAL LOW (ref 36.0–46.0)
Hemoglobin: 9.9 g/dL — ABNORMAL LOW (ref 12.0–15.0)
Immature Granulocytes: 1 %
Lymphocytes Relative: 29 %
Lymphs Abs: 1.1 10*3/uL (ref 0.7–4.0)
MCH: 27 pg (ref 26.0–34.0)
MCHC: 30 g/dL (ref 30.0–36.0)
MCV: 89.9 fL (ref 80.0–100.0)
Monocytes Absolute: 0.3 10*3/uL (ref 0.1–1.0)
Monocytes Relative: 9 %
Neutro Abs: 2.2 10*3/uL (ref 1.7–7.7)
Neutrophils Relative %: 58 %
Platelets: 222 10*3/uL (ref 150–400)
RBC: 3.67 MIL/uL — ABNORMAL LOW (ref 3.87–5.11)
RDW: 15.4 % (ref 11.5–15.5)
WBC: 3.8 10*3/uL — ABNORMAL LOW (ref 4.0–10.5)
nRBC: 0 % (ref 0.0–0.2)

## 2019-03-10 LAB — COMPREHENSIVE METABOLIC PANEL
ALT: 19 U/L (ref 0–44)
AST: 28 U/L (ref 15–41)
Albumin: 4 g/dL (ref 3.5–5.0)
Alkaline Phosphatase: 100 U/L (ref 38–126)
Anion gap: 9 (ref 5–15)
BUN: 14 mg/dL (ref 8–23)
CO2: 26 mmol/L (ref 22–32)
Calcium: 9.5 mg/dL (ref 8.9–10.3)
Chloride: 106 mmol/L (ref 98–111)
Creatinine, Ser: 1.1 mg/dL — ABNORMAL HIGH (ref 0.44–1.00)
GFR calc Af Amer: 57 mL/min — ABNORMAL LOW (ref >60)
GFR calc non Af Amer: 49 mL/min — ABNORMAL LOW (ref >60)
Glucose, Bld: 113 mg/dL — ABNORMAL HIGH (ref 70–99)
Potassium: 4.6 mmol/L (ref 3.5–5.1)
Sodium: 141 mmol/L (ref 135–145)
Total Bilirubin: 0.4 mg/dL (ref 0.3–1.2)
Total Protein: 7.1 g/dL (ref 6.5–8.1)

## 2019-03-10 LAB — SURGICAL PCR SCREEN
MRSA, PCR: NEGATIVE
Staphylococcus aureus: NEGATIVE

## 2019-03-10 LAB — SARS CORONAVIRUS 2 (TAT 6-24 HRS): SARS Coronavirus 2: NEGATIVE

## 2019-03-10 NOTE — Progress Notes (Signed)
CBC w/Diff routed to Dr. Ronnie Derby for review

## 2019-03-12 ENCOUNTER — Encounter (HOSPITAL_COMMUNITY): Payer: Self-pay | Admitting: Anesthesiology

## 2019-03-12 MED ORDER — BUPIVACAINE LIPOSOME 1.3 % IJ SUSP
20.0000 mL | Freq: Once | INTRAMUSCULAR | Status: DC
  Filled 2019-03-12: qty 20

## 2019-03-13 ENCOUNTER — Ambulatory Visit (HOSPITAL_COMMUNITY): Payer: Medicare Other | Admitting: Emergency Medicine

## 2019-03-13 ENCOUNTER — Other Ambulatory Visit: Payer: Self-pay

## 2019-03-13 ENCOUNTER — Ambulatory Visit (HOSPITAL_COMMUNITY): Payer: Medicare Other | Admitting: Anesthesiology

## 2019-03-13 ENCOUNTER — Observation Stay (HOSPITAL_COMMUNITY)
Admission: RE | Admit: 2019-03-13 | Discharge: 2019-03-14 | Disposition: A | Discharge status: Home / Self Care | Payer: Medicare Other | Attending: Orthopedic Surgery | Admitting: Orthopedic Surgery

## 2019-03-13 ENCOUNTER — Encounter (HOSPITAL_COMMUNITY)
Admission: RE | Disposition: A | Discharge status: Home / Self Care | Payer: Self-pay | Source: Home / Self Care | Attending: Orthopedic Surgery

## 2019-03-13 ENCOUNTER — Telehealth (HOSPITAL_COMMUNITY): Payer: Self-pay | Admitting: *Deleted

## 2019-03-13 DIAGNOSIS — Z886 Allergy status to analgesic agent status: Secondary | ICD-10-CM | POA: Insufficient documentation

## 2019-03-13 DIAGNOSIS — Z888 Allergy status to other drugs, medicaments and biological substances status: Secondary | ICD-10-CM | POA: Insufficient documentation

## 2019-03-13 DIAGNOSIS — M545 Low back pain: Secondary | ICD-10-CM | POA: Insufficient documentation

## 2019-03-13 DIAGNOSIS — G2581 Restless legs syndrome: Secondary | ICD-10-CM | POA: Insufficient documentation

## 2019-03-13 DIAGNOSIS — Z96651 Presence of right artificial knee joint: Secondary | ICD-10-CM | POA: Insufficient documentation

## 2019-03-13 DIAGNOSIS — E0500 Thyrotoxicosis with diffuse goiter without thyrotoxic crisis or storm: Secondary | ICD-10-CM | POA: Insufficient documentation

## 2019-03-13 DIAGNOSIS — Z880 Allergy status to penicillin: Secondary | ICD-10-CM | POA: Insufficient documentation

## 2019-03-13 DIAGNOSIS — Z79891 Long term (current) use of opiate analgesic: Secondary | ICD-10-CM | POA: Insufficient documentation

## 2019-03-13 DIAGNOSIS — F3181 Bipolar II disorder: Secondary | ICD-10-CM | POA: Diagnosis not present

## 2019-03-13 DIAGNOSIS — M797 Fibromyalgia: Secondary | ICD-10-CM | POA: Insufficient documentation

## 2019-03-13 DIAGNOSIS — Z79899 Other long term (current) drug therapy: Secondary | ICD-10-CM | POA: Insufficient documentation

## 2019-03-13 DIAGNOSIS — Z96659 Presence of unspecified artificial knee joint: Secondary | ICD-10-CM

## 2019-03-13 DIAGNOSIS — I10 Essential (primary) hypertension: Secondary | ICD-10-CM | POA: Insufficient documentation

## 2019-03-13 DIAGNOSIS — Z8673 Personal history of transient ischemic attack (TIA), and cerebral infarction without residual deficits: Secondary | ICD-10-CM | POA: Insufficient documentation

## 2019-03-13 DIAGNOSIS — R296 Repeated falls: Secondary | ICD-10-CM | POA: Insufficient documentation

## 2019-03-13 DIAGNOSIS — E039 Hypothyroidism, unspecified: Secondary | ICD-10-CM | POA: Insufficient documentation

## 2019-03-13 DIAGNOSIS — Z1159 Encounter for screening for other viral diseases: Secondary | ICD-10-CM | POA: Diagnosis not present

## 2019-03-13 DIAGNOSIS — M3500 Sicca syndrome, unspecified: Secondary | ICD-10-CM | POA: Insufficient documentation

## 2019-03-13 DIAGNOSIS — Z9884 Bariatric surgery status: Secondary | ICD-10-CM | POA: Insufficient documentation

## 2019-03-13 DIAGNOSIS — K449 Diaphragmatic hernia without obstruction or gangrene: Secondary | ICD-10-CM | POA: Insufficient documentation

## 2019-03-13 DIAGNOSIS — M1712 Unilateral primary osteoarthritis, left knee: Principal | ICD-10-CM | POA: Insufficient documentation

## 2019-03-13 DIAGNOSIS — F411 Generalized anxiety disorder: Secondary | ICD-10-CM | POA: Insufficient documentation

## 2019-03-13 HISTORY — PX: TOTAL KNEE ARTHROPLASTY: SHX125

## 2019-03-13 SURGERY — ARTHROPLASTY, KNEE, TOTAL
Anesthesia: Spinal | Site: Knee | Laterality: Left

## 2019-03-13 MED ORDER — LACTATED RINGERS IV SOLN: INTRAVENOUS | Status: DC

## 2019-03-13 MED ORDER — ONDANSETRON HCL 4 MG PO TABS: 4.0000 mg | ORAL_TABLET | Freq: Four times a day (QID) | ORAL | Status: DC | PRN

## 2019-03-13 MED ORDER — METOCLOPRAMIDE HCL 5 MG/ML IJ SOLN: 5.0000 mg | Freq: Three times a day (TID) | INTRAMUSCULAR | Status: DC | PRN

## 2019-03-13 MED ORDER — APIXABAN 2.5 MG PO TABS
2.5000 mg | ORAL_TABLET | Freq: Two times a day (BID) | ORAL | Status: DC
  Administered 2019-03-14: 2.5 mg via ORAL
  Filled 2019-03-13: qty 1

## 2019-03-13 MED ORDER — POVIDONE-IODINE 10 % EX SWAB
2.0000 "application " | Freq: Once | CUTANEOUS | Status: AC
  Administered 2019-03-13: 2 via TOPICAL

## 2019-03-13 MED ORDER — LEVOTHYROXINE SODIUM 125 MCG PO TABS
125.0000 ug | ORAL_TABLET | Freq: Every day | ORAL | Status: DC
  Administered 2019-03-14: 125 ug via ORAL
  Filled 2019-03-13: qty 1

## 2019-03-13 MED ORDER — PROPOFOL 500 MG/50ML IV EMUL
INTRAVENOUS | Status: DC | PRN
  Administered 2019-03-13: 75 ug/kg/min via INTRAVENOUS

## 2019-03-13 MED ORDER — FENTANYL CITRATE (PF) 100 MCG/2ML IJ SOLN
INTRAMUSCULAR | Status: AC
  Filled 2019-03-13: qty 2

## 2019-03-13 MED ORDER — METHOCARBAMOL 500 MG PO TABS
500.0000 mg | ORAL_TABLET | Freq: Four times a day (QID) | ORAL | Status: DC | PRN
  Administered 2019-03-13 – 2019-03-14 (×3): 500 mg via ORAL
  Filled 2019-03-13 (×3): qty 1

## 2019-03-13 MED ORDER — MECLIZINE HCL 25 MG PO TABS: 12.5000 mg | ORAL_TABLET | Freq: Three times a day (TID) | ORAL | Status: DC | PRN

## 2019-03-13 MED ORDER — CEFAZOLIN SODIUM-DEXTROSE 2-4 GM/100ML-% IV SOLN
2.0000 g | INTRAVENOUS | Status: AC
  Administered 2019-03-13: 2 g via INTRAVENOUS
  Filled 2019-03-13: qty 100

## 2019-03-13 MED ORDER — MIDAZOLAM HCL 2 MG/2ML IJ SOLN
INTRAMUSCULAR | Status: AC
  Filled 2019-03-13: qty 2

## 2019-03-13 MED ORDER — FLEET ENEMA 7-19 GM/118ML RE ENEM: 1.0000 | ENEMA | Freq: Once | RECTAL | Status: DC | PRN

## 2019-03-13 MED ORDER — SODIUM CHLORIDE 0.9 % IR SOLN
Status: DC | PRN
  Administered 2019-03-13: 1000 mL

## 2019-03-13 MED ORDER — SODIUM CHLORIDE 0.9% FLUSH
INTRAVENOUS | Status: DC | PRN
  Administered 2019-03-13: 20 mL via INTRAVENOUS

## 2019-03-13 MED ORDER — BUPIVACAINE IN DEXTROSE 0.75-8.25 % IT SOLN
INTRATHECAL | Status: DC | PRN
  Administered 2019-03-13: 1.6 mL via INTRATHECAL

## 2019-03-13 MED ORDER — MENTHOL 3 MG MT LOZG: 1.0000 | LOZENGE | OROMUCOSAL | Status: DC | PRN

## 2019-03-13 MED ORDER — LAMOTRIGINE 100 MG PO TABS
300.0000 mg | ORAL_TABLET | Freq: Every day | ORAL | Status: DC
  Administered 2019-03-13: 300 mg via ORAL
  Filled 2019-03-13: qty 3

## 2019-03-13 MED ORDER — ALPRAZOLAM 0.5 MG PO TABS
0.5000 mg | ORAL_TABLET | Freq: Two times a day (BID) | ORAL | Status: DC | PRN
  Administered 2019-03-13: 0.5 mg via ORAL
  Filled 2019-03-13: qty 1

## 2019-03-13 MED ORDER — BUPIVACAINE LIPOSOME 1.3 % IJ SUSP
INTRAMUSCULAR | Status: DC | PRN
  Administered 2019-03-13: 20 mL

## 2019-03-13 MED ORDER — DOCUSATE SODIUM 100 MG PO CAPS
100.0000 mg | ORAL_CAPSULE | Freq: Two times a day (BID) | ORAL | Status: DC
  Administered 2019-03-13 – 2019-03-14 (×2): 100 mg via ORAL
  Filled 2019-03-13 (×2): qty 1

## 2019-03-13 MED ORDER — TRANEXAMIC ACID-NACL 1000-0.7 MG/100ML-% IV SOLN
1000.0000 mg | INTRAVENOUS | Status: AC
  Administered 2019-03-13: 1000 mg via INTRAVENOUS
  Filled 2019-03-13: qty 100

## 2019-03-13 MED ORDER — PROMETHAZINE HCL 25 MG/ML IJ SOLN: 6.2500 mg | INTRAMUSCULAR | Status: DC | PRN

## 2019-03-13 MED ORDER — EPHEDRINE 5 MG/ML INJ
INTRAVENOUS | Status: AC
  Filled 2019-03-13: qty 10

## 2019-03-13 MED ORDER — DULOXETINE HCL 60 MG PO CPEP
120.0000 mg | ORAL_CAPSULE | Freq: Every evening | ORAL | Status: DC
  Administered 2019-03-13: 18:00:00 120 mg via ORAL
  Filled 2019-03-13: qty 2

## 2019-03-13 MED ORDER — BISACODYL 5 MG PO TBEC: 5.0000 mg | DELAYED_RELEASE_TABLET | Freq: Every day | ORAL | Status: DC | PRN

## 2019-03-13 MED ORDER — ONDANSETRON HCL 4 MG/2ML IJ SOLN: 4.0000 mg | Freq: Four times a day (QID) | INTRAMUSCULAR | Status: DC | PRN

## 2019-03-13 MED ORDER — SODIUM CHLORIDE 0.9 % IV SOLN
INTRAVENOUS | Status: DC
  Administered 2019-03-13: 13:00:00 via INTRAVENOUS

## 2019-03-13 MED ORDER — BUPIVACAINE-EPINEPHRINE 0.25% -1:200000 IJ SOLN
INTRAMUSCULAR | Status: DC | PRN
  Administered 2019-03-13: 30 mL

## 2019-03-13 MED ORDER — EPHEDRINE SULFATE-NACL 50-0.9 MG/10ML-% IV SOSY
PREFILLED_SYRINGE | INTRAVENOUS | Status: DC | PRN
  Administered 2019-03-13 (×9): 5 mg via INTRAVENOUS

## 2019-03-13 MED ORDER — SENNOSIDES-DOCUSATE SODIUM 8.6-50 MG PO TABS: 1.0000 | ORAL_TABLET | Freq: Every evening | ORAL | Status: DC | PRN

## 2019-03-13 MED ORDER — FENTANYL CITRATE (PF) 100 MCG/2ML IJ SOLN
INTRAMUSCULAR | Status: AC
  Administered 2019-03-13: 09:00:00 50 ug via INTRAVENOUS
  Filled 2019-03-13: qty 2

## 2019-03-13 MED ORDER — CLINDAMYCIN PHOSPHATE 600 MG/50ML IV SOLN
600.0000 mg | Freq: Four times a day (QID) | INTRAVENOUS | Status: AC
  Administered 2019-03-13 (×2): 600 mg via INTRAVENOUS
  Filled 2019-03-13 (×2): qty 50

## 2019-03-13 MED ORDER — BUPIVACAINE HCL (PF) 0.5 % IJ SOLN
INTRAMUSCULAR | Status: DC | PRN
  Administered 2019-03-13: 20 mL via PERINEURAL

## 2019-03-13 MED ORDER — OXYCODONE HCL 5 MG PO TABS
5.0000 mg | ORAL_TABLET | ORAL | Status: DC | PRN
  Administered 2019-03-13: 5 mg via ORAL
  Administered 2019-03-13: 14:00:00 10 mg via ORAL
  Administered 2019-03-13 – 2019-03-14 (×2): 5 mg via ORAL
  Administered 2019-03-14: 16:00:00 10 mg via ORAL
  Administered 2019-03-14: 5 mg via ORAL
  Filled 2019-03-13: qty 1
  Filled 2019-03-13: qty 2
  Filled 2019-03-13: qty 1
  Filled 2019-03-13: qty 2
  Filled 2019-03-13 (×2): qty 1

## 2019-03-13 MED ORDER — DIPHENHYDRAMINE HCL 12.5 MG/5ML PO ELIX: 12.5000 mg | ORAL_SOLUTION | ORAL | Status: DC | PRN

## 2019-03-13 MED ORDER — ONDANSETRON HCL 4 MG/2ML IJ SOLN
INTRAMUSCULAR | Status: DC | PRN
  Administered 2019-03-13: 4 mg via INTRAVENOUS

## 2019-03-13 MED ORDER — FENTANYL CITRATE (PF) 100 MCG/2ML IJ SOLN
50.0000 ug | INTRAMUSCULAR | Status: DC
  Administered 2019-03-13: 09:00:00 50 ug via INTRAVENOUS

## 2019-03-13 MED ORDER — PHENYLEPHRINE HCL-NACL 10-0.9 MG/250ML-% IV SOLN
INTRAVENOUS | Status: AC
  Filled 2019-03-13: qty 500

## 2019-03-13 MED ORDER — PHENOL 1.4 % MT LIQD: 1.0000 | OROMUCOSAL | Status: DC | PRN

## 2019-03-13 MED ORDER — GABAPENTIN 300 MG PO CAPS
300.0000 mg | ORAL_CAPSULE | Freq: Once | ORAL | Status: AC
  Administered 2019-03-13: 300 mg via ORAL
  Filled 2019-03-13: qty 1

## 2019-03-13 MED ORDER — MIDAZOLAM HCL 5 MG/5ML IJ SOLN
INTRAMUSCULAR | Status: DC | PRN
  Administered 2019-03-13 (×2): 1 mg via INTRAVENOUS

## 2019-03-13 MED ORDER — CLONIDINE HCL (ANALGESIA) 100 MCG/ML EP SOLN
EPIDURAL | Status: DC | PRN
  Administered 2019-03-13: 70 ug

## 2019-03-13 MED ORDER — DEXAMETHASONE SODIUM PHOSPHATE 10 MG/ML IJ SOLN: 10.0000 mg | Freq: Once | INTRAMUSCULAR | Status: DC

## 2019-03-13 MED ORDER — PROPOFOL 10 MG/ML IV BOLUS
INTRAVENOUS | Status: AC
  Filled 2019-03-13: qty 60

## 2019-03-13 MED ORDER — PANTOPRAZOLE SODIUM 40 MG PO TBEC
40.0000 mg | DELAYED_RELEASE_TABLET | Freq: Every day | ORAL | Status: DC
  Administered 2019-03-14: 40 mg via ORAL
  Filled 2019-03-13: qty 1

## 2019-03-13 MED ORDER — CHLORHEXIDINE GLUCONATE 4 % EX LIQD: 60.0000 mL | Freq: Once | CUTANEOUS | Status: DC

## 2019-03-13 MED ORDER — BUPIVACAINE-EPINEPHRINE (PF) 0.25% -1:200000 IJ SOLN
INTRAMUSCULAR | Status: AC
  Filled 2019-03-13: qty 30

## 2019-03-13 MED ORDER — ALUM & MAG HYDROXIDE-SIMETH 200-200-20 MG/5ML PO SUSP: 30.0000 mL | ORAL | Status: DC | PRN

## 2019-03-13 MED ORDER — ACETAMINOPHEN 500 MG PO TABS
1000.0000 mg | ORAL_TABLET | Freq: Once | ORAL | Status: AC
  Administered 2019-03-13: 07:00:00 1000 mg via ORAL
  Filled 2019-03-13: qty 2

## 2019-03-13 MED ORDER — ROPINIROLE HCL 1 MG PO TABS: 1.0000 mg | ORAL_TABLET | Freq: Every evening | ORAL | Status: DC | PRN

## 2019-03-13 MED ORDER — ACETAMINOPHEN 500 MG PO TABS
1000.0000 mg | ORAL_TABLET | Freq: Four times a day (QID) | ORAL | Status: AC
  Administered 2019-03-13 – 2019-03-14 (×4): 1000 mg via ORAL
  Filled 2019-03-13 (×4): qty 2

## 2019-03-13 MED ORDER — MIDAZOLAM HCL 2 MG/2ML IJ SOLN: 1.0000 mg | INTRAMUSCULAR | Status: DC

## 2019-03-13 MED ORDER — SODIUM CHLORIDE (PF) 0.9 % IJ SOLN
INTRAMUSCULAR | Status: AC
  Filled 2019-03-13: qty 20

## 2019-03-13 MED ORDER — TRAMADOL HCL 50 MG PO TABS
50.0000 mg | ORAL_TABLET | Freq: Four times a day (QID) | ORAL | Status: DC
  Administered 2019-03-13 – 2019-03-14 (×4): 50 mg via ORAL
  Filled 2019-03-13 (×4): qty 1

## 2019-03-13 MED ORDER — LACTATED RINGERS IV SOLN
INTRAVENOUS | Status: DC
  Administered 2019-03-13 (×2): via INTRAVENOUS

## 2019-03-13 MED ORDER — WATER FOR IRRIGATION, STERILE IR SOLN
Status: DC | PRN
  Administered 2019-03-13 (×2): 1000 mL

## 2019-03-13 MED ORDER — METOCLOPRAMIDE HCL 5 MG PO TABS: 5.0000 mg | ORAL_TABLET | Freq: Three times a day (TID) | ORAL | Status: DC | PRN

## 2019-03-13 MED ORDER — PROPOFOL 10 MG/ML IV BOLUS
INTRAVENOUS | Status: AC
  Filled 2019-03-13: qty 80

## 2019-03-13 MED ORDER — HYDROMORPHONE HCL 1 MG/ML IJ SOLN: 0.2500 mg | INTRAMUSCULAR | Status: DC | PRN

## 2019-03-13 MED ORDER — HYDROMORPHONE HCL 1 MG/ML IJ SOLN
0.5000 mg | INTRAMUSCULAR | Status: DC | PRN
  Administered 2019-03-14: 1 mg via INTRAVENOUS
  Filled 2019-03-13: qty 1

## 2019-03-13 MED ORDER — TRANEXAMIC ACID-NACL 1000-0.7 MG/100ML-% IV SOLN
1000.0000 mg | Freq: Once | INTRAVENOUS | Status: AC
  Administered 2019-03-13: 1000 mg via INTRAVENOUS
  Filled 2019-03-13: qty 100

## 2019-03-13 MED ORDER — LORAZEPAM 1 MG PO TABS
1.0000 mg | ORAL_TABLET | Freq: Every day | ORAL | Status: DC
  Administered 2019-03-13: 1 mg via ORAL
  Filled 2019-03-13: qty 1

## 2019-03-13 MED ORDER — DEXAMETHASONE SODIUM PHOSPHATE 10 MG/ML IJ SOLN
8.0000 mg | Freq: Once | INTRAMUSCULAR | Status: AC
  Administered 2019-03-13: 8 mg via INTRAVENOUS

## 2019-03-13 MED ORDER — LIDOCAINE 2% (20 MG/ML) 5 ML SYRINGE
INTRAMUSCULAR | Status: AC
  Filled 2019-03-13: qty 5

## 2019-03-13 MED ORDER — PROPOFOL 10 MG/ML IV BOLUS
INTRAVENOUS | Status: DC | PRN
  Administered 2019-03-13: 30 mg via INTRAVENOUS

## 2019-03-13 MED ORDER — GABAPENTIN 300 MG PO CAPS
300.0000 mg | ORAL_CAPSULE | Freq: Three times a day (TID) | ORAL | Status: DC
  Administered 2019-03-13 – 2019-03-14 (×4): 300 mg via ORAL
  Filled 2019-03-13 (×4): qty 1

## 2019-03-13 MED ORDER — FERROUS SULFATE 325 (65 FE) MG PO TABS
325.0000 mg | ORAL_TABLET | Freq: Three times a day (TID) | ORAL | Status: DC
  Administered 2019-03-14 (×2): 325 mg via ORAL
  Filled 2019-03-13 (×2): qty 1

## 2019-03-13 MED ORDER — METHOCARBAMOL 500 MG IVPB - SIMPLE MED
500.0000 mg | Freq: Four times a day (QID) | INTRAVENOUS | Status: DC | PRN
  Filled 2019-03-13: qty 50

## 2019-03-13 SURGICAL SUPPLY — 60 items
ARTI SURF LEGACY SZ EF 10 (Knees) ×1 IMPLANT
BAG ZIPLOCK 12X15 (MISCELLANEOUS) ×2 IMPLANT
BLADE SAGITTAL 13X1.27X60 (BLADE) ×2 IMPLANT
BLADE SAW SGTL 83.5X18.5 (BLADE) ×2 IMPLANT
BLADE SURG 15 STRL LF DISP TIS (BLADE) ×1 IMPLANT
BLADE SURG 15 STRL SS (BLADE) ×1
BLADE SURG SZ10 CARB STEEL (BLADE) ×4 IMPLANT
BNDG ELASTIC 6X5.8 VLCR STR LF (GAUZE/BANDAGES/DRESSINGS) ×2 IMPLANT
BOWL SMART MIX CTS (DISPOSABLE) ×2 IMPLANT
CEMENT BONE SIMPLEX SPEEDSET (Cement) ×4 IMPLANT
CLSR STERI-STRIP ANTIMIC 1/2X4 (GAUZE/BANDAGES/DRESSINGS) ×1 IMPLANT
COMP PATELLA 32 STD 8.5 THK (Orthopedic Implant) IMPLANT
COMPONENT KN LT LPS-FLEX SZE (Miscellaneous) IMPLANT
COMPONET KNEE LT LPS-FLEX SZ E (Miscellaneous) ×2 IMPLANT
COVER SURGICAL LIGHT HANDLE (MISCELLANEOUS) ×2 IMPLANT
COVER WAND RF STERILE (DRAPES) IMPLANT
CUFF TOURN SGL QUICK 34 (TOURNIQUET CUFF) ×1
CUFF TRNQT CYL 34X4.125X (TOURNIQUET CUFF) ×1 IMPLANT
DECANTER SPIKE VIAL GLASS SM (MISCELLANEOUS) ×4 IMPLANT
DRAPE INCISE IOBAN 66X45 STRL (DRAPES) ×4 IMPLANT
DRAPE U-SHAPE 47X51 STRL (DRAPES) ×2 IMPLANT
DRESSING AQUACEL AG SP 3.5X10 (GAUZE/BANDAGES/DRESSINGS) IMPLANT
DRILL PIN HEADLESS TROCAR 3X75 (PIN) ×1 IMPLANT
DRSG AQUACEL AG ADV 3.5X10 (GAUZE/BANDAGES/DRESSINGS) ×2 IMPLANT
DRSG AQUACEL AG SP 3.5X10 (GAUZE/BANDAGES/DRESSINGS) ×2
DURAPREP 26ML APPLICATOR (WOUND CARE) ×4 IMPLANT
ELECT REM PT RETURN 15FT ADLT (MISCELLANEOUS) ×2 IMPLANT
GLOVE BIOGEL M STRL SZ7.5 (GLOVE) ×2 IMPLANT
GLOVE BIOGEL PI IND STRL 7.5 (GLOVE) ×1 IMPLANT
GLOVE BIOGEL PI IND STRL 8.5 (GLOVE) ×2 IMPLANT
GLOVE BIOGEL PI INDICATOR 7.5 (GLOVE) ×1
GLOVE BIOGEL PI INDICATOR 8.5 (GLOVE) ×2
GLOVE SURG ORTHO 8.0 STRL STRW (GLOVE) ×6 IMPLANT
GOWN STRL REUS W/ TWL XL LVL3 (GOWN DISPOSABLE) ×2 IMPLANT
GOWN STRL REUS W/TWL XL LVL3 (GOWN DISPOSABLE) ×2
HANDPIECE INTERPULSE COAX TIP (DISPOSABLE) ×1
HOLDER FOLEY CATH W/STRAP (MISCELLANEOUS) ×2 IMPLANT
HOOD PEEL AWAY FLYTE STAYCOOL (MISCELLANEOUS) ×6 IMPLANT
KIT TURNOVER KIT A (KITS) IMPLANT
MANIFOLD NEPTUNE II (INSTRUMENTS) ×2 IMPLANT
NEEDLE HYPO 22GX1.5 SAFETY (NEEDLE) ×2 IMPLANT
NS IRRIG 1000ML POUR BTL (IV SOLUTION) ×2 IMPLANT
PACK TOTAL KNEE CUSTOM (KITS) ×2 IMPLANT
PATELLA ZIMMER 32MM (Orthopedic Implant) ×2 IMPLANT
PLATE TIB 3 66X42NT ZIER (Orthopedic Implant) IMPLANT
PROTECTOR NERVE ULNAR (MISCELLANEOUS) ×2 IMPLANT
SET HNDPC FAN SPRY TIP SCT (DISPOSABLE) ×1 IMPLANT
STRIP CLOSURE SKIN 1/2X4 (GAUZE/BANDAGES/DRESSINGS) ×2 IMPLANT
SUT BONE WAX W31G (SUTURE) ×2 IMPLANT
SUT MNCRL AB 3-0 PS2 18 (SUTURE) ×2 IMPLANT
SUT STRATAFIX 0 PDS 27 VIOLET (SUTURE) ×2
SUT STRATAFIX PDS+ 0 24IN (SUTURE) ×2 IMPLANT
SUT VIC AB 1 CT1 36 (SUTURE) ×2 IMPLANT
SUTURE STRATFX 0 PDS 27 VIOLET (SUTURE) ×1 IMPLANT
SYR CONTROL 10ML LL (SYRINGE) ×4 IMPLANT
TIBIA ZIMMER (Orthopedic Implant) ×1 IMPLANT
TRAY FOLEY MTR SLVR 16FR STAT (SET/KITS/TRAYS/PACK) ×2 IMPLANT
WATER STERILE IRR 1000ML POUR (IV SOLUTION) ×4 IMPLANT
WRAP KNEE MAXI GEL POST OP (GAUZE/BANDAGES/DRESSINGS) ×2 IMPLANT
YANKAUER SUCT BULB TIP 10FT TU (MISCELLANEOUS) ×2 IMPLANT

## 2019-03-13 NOTE — Progress Notes (Signed)
Jillian Morris called and given room # and updated by RN.  Jillian Morris spoke with pt.

## 2019-03-13 NOTE — Evaluation (Signed)
Physical Therapy Evaluation Patient Details Name: Jillian Morris MRN: [id number] DOB: [birth year] Today's Date: 03/13/2019   History of Present Illness  s/p L TKA. PMH: bipolar, fibromyalgia  Clinical Impression  Pt is s/p TKA resulting in the deficits listed below (see PT Problem List).  Amb 30' with RW and min assist, will continue PT in acute setting  Pt will benefit from skilled PT to increase their independence and safety with mobility to allow discharge to the venue listed below.    Follow Up Recommendations Follow surgeon's recommendation for DC plan and follow-up therapies    Equipment Recommendations  Rolling walker with 5" wheels;3in1 (PT)    Recommendations for Other Services       Precautions / Restrictions Precautions Precautions: Fall;Knee Restrictions Weight Bearing Restrictions: No Other Position/Activity Restrictions: WBAT      Mobility  Bed Mobility Overal bed mobility: Needs Assistance Bed Mobility: Supine to Sit     Supine to sit: Min assist     General bed mobility comments: assist with LLE  Transfers Overall transfer level: Needs assistance Equipment used: Rolling walker (2 wheeled) Transfers: Sit to/from Stand Sit to Stand: Min assist         General transfer comment: cues for hand placement, and LLE position  Ambulation/Gait Ambulation/Gait assistance: Min assist Gait Distance (Feet): 30 Feet   Gait Pattern/deviations: Step-to pattern;Wide base of support;Decreased weight shift to left     General Gait Details: cues for sequence and safety, unsteady requiring assist when wt shifting  Stairs            Wheelchair Mobility    Modified Rankin (Stroke Patients Only)       Balance                                             Pertinent Vitals/Pain Pain Assessment: 0-10 Pain Score: 3  Pain Location: left knee Pain Descriptors / Indicators: Sore Pain Intervention(s): Monitored during session;Limited  activity within patient's tolerance;Premedicated before session    Home Living Family/patient expects to be discharged to:: Private residence   Available Help at Discharge: Available 24 hours/day Type of Home: House Home Access: Stairs to enter Entrance Stairs-Rails: Right Entrance Stairs-Number of Steps: 4 Home Layout: One level Home Equipment: Cane - single point      Prior Function Level of Independence: Independent               Hand Dominance        Extremity/Trunk Assessment   Upper Extremity Assessment Upper Extremity Assessment: Overall WFL for tasks assessed    Lower Extremity Assessment Lower Extremity Assessment: LLE deficits/detail LLE Deficits / Details: ankle WFL; knee and hip 2+/5 grossly; knee flexion 8 to 60 degrees       Communication      Cognition Arousal/Alertness: Awake/alert Behavior During Therapy: WFL for tasks assessed/performed Overall Cognitive Status: Within Functional Limits for tasks assessed                                        General Comments      Exercises Total Joint Exercises Ankle Circles/Pumps: AROM;Both;10 reps   Assessment/Plan    PT Assessment Patient needs continued PT services  PT Problem List Decreased strength;Decreased mobility;Decreased range of motion;Decreased activity tolerance;Decreased  balance;Pain       PT Treatment Interventions Stair training;Gait training;DME instruction;Therapeutic exercise;Patient/family education    PT Goals (Current goals can be found in the Care Plan section)  Acute Rehab PT Goals PT Goal Formulation: With patient Time For Goal Achievement: 03/20/19 Potential to Achieve Goals: Good    Frequency 7X/week   Barriers to discharge        Co-evaluation               AM-PAC PT "6 Clicks" Mobility  Outcome Measure Help needed turning from your back to your side while in a flat bed without using bedrails?: A Little Help needed moving from lying  on your back to sitting on the side of a flat bed without using bedrails?: A Little Help needed moving to and from a bed to a chair (including a wheelchair)?: A Little Help needed standing up from a chair using your arms (e.g., wheelchair or bedside chair)?: A Little Help needed to walk in hospital room?: A Little Help needed climbing 3-5 steps with a railing? : A Lot 6 Click Score: 17    End of Session Equipment Utilized During Treatment: Gait belt Activity Tolerance: Patient tolerated treatment well Patient left: in chair;with call bell/phone within reach;with chair alarm set   PT Visit Diagnosis: Unsteadiness on feet (R26.81);Difficulty in walking, not elsewhere classified (R26.2)    Time: [id number] PT Time Calculation (min) (ACUTE ONLY): 26 min   Charges:   PT Evaluation $PT Eval Low Complexity: 1 Low PT Treatments $Gait Training: 8-22 mins        Jillian Morris, PT  Pager: [phone] Acute Rehab Dept [facility]): [phone]   03/13/2019   [facility] Center[facility]ILLIAMS,Jillian Wandrey 03/13/2019, 4:54 PM

## 2019-03-13 NOTE — Anesthesia Preprocedure Evaluation (Signed)
Anesthesia Evaluation  Patient identified by MRN, date of birth, ID band Patient awake    Reviewed: Allergy & Precautions, NPO status , Patient's Chart, lab work & pertinent test results  Airway Mallampati: II  TM Distance: >3 FB Neck ROM: Full    Dental no notable dental hx.    Pulmonary neg pulmonary ROS,    Pulmonary exam normal breath sounds clear to auscultation       Cardiovascular hypertension, Normal cardiovascular exam Rhythm:Regular Rate:Normal     Neuro/Psych negative neurological ROS  negative psych ROS   GI/Hepatic negative GI ROS, Neg liver ROS,   Endo/Other  Hypothyroidism   Renal/GU negative Renal ROS  negative genitourinary   Musculoskeletal  (+) Arthritis ,   Abdominal   Peds negative pediatric ROS (+)  Hematology negative hematology ROS (+)   Anesthesia Other Findings   Reproductive/Obstetrics negative OB ROS                             Anesthesia Physical Anesthesia Plan  ASA: II  Anesthesia Plan: Spinal   Post-op Pain Management:  Regional for Post-op pain   Induction: Intravenous  PONV Risk Score and Plan: 2 and Ondansetron and Dexamethasone  Airway Management Planned: Simple Face Mask  Additional Equipment:   Intra-op Plan:   Post-operative Plan:   Informed Consent: I have reviewed the patients History and Physical, chart, labs and discussed the procedure including the risks, benefits and alternatives for the proposed anesthesia with the patient or authorized representative who has indicated his/her understanding and acceptance.     Dental advisory given  Plan Discussed with: CRNA and Surgeon  Anesthesia Plan Comments:         Anesthesia Quick Evaluation

## 2019-03-13 NOTE — Op Note (Signed)
TOTAL KNEE REPLACEMENT OPERATIVE NOTE:  03/13/2019  11:14 AM  PATIENT:  Jillian Morris  73 y.o. female  PRE-OPERATIVE DIAGNOSIS:  LT. KNEE OSTEOARTHRITIS  POST-OPERATIVE DIAGNOSIS:  LT. KNEE OSTEOARTHRITIS  PROCEDURE:  Procedure(s): TOTAL KNEE ARTHROPLASTY  SURGEON:  Surgeon(s): Dannielle HuhLucey, Elizer Bostic, MD  PHYSICIAN ASSISTANT: Laurier Nancyolby Robbins, PA-C  ANESTHESIA:   spinal  SPECIMEN: None  COUNTS:  Correct  TOURNIQUET:   Total Tourniquet Time Documented: Thigh (Left) - 41 minutes Total: Thigh (Left) - 41 minutes   DICTATION:  Indication for procedure:    The patient is a 73 y.o. female who has failed conservative treatment for LT. KNEE OSTEOARTHRITIS.  Informed consent was obtained prior to anesthesia. The risks versus benefits of the operation were explain and in a way the patient can, and did, understand.   The patient has a known nickel allergy so an all titanium zimmer nex-gen implant was used  Description of procedure:     The patient was taken to the operating room and placed under anesthesia.  The patient was positioned in the usual fashion taking care that all body parts were adequately padded and/or protected.  A tourniquet was applied and the leg prepped and draped in the usual sterile fashion.  The extremity was exsanguinated with the esmarch and tourniquet inflated to 350 mmHg.  Pre-operative range of motion was normal.   A midline incision approximately 6-7 inches long was made with a #10 blade.  A new blade was used to make a parapatellar arthrotomy going 2-3 cm into the quadriceps tendon, over the patella, and alongside the medial aspect of the patellar tendon.  A synovectomy was then performed with the #10 blade and forceps. I then elevated the deep MCL off the medial tibial metaphysis subperiosteally around to the semimembranosus attachment.    I everted the patella and used calipers to measure patellar thickness.  I used the reamer to ream down to appropriate  thickness to recreate the native thickness.  I then removed excess bone with the rongeur and sagittal saw.  I used the appropriately sized template and drilled the three lug holes.  I then put the trial in place and measured the thickness with the calipers to ensure recreation of the native thickness.  The trial was then removed and the patella subluxed and the knee brought into flexion.  A homan retractor was place to retract and protect the patella and lateral structures.  A Z-retractor was place medially to protect the medial structures.  The extra-medullary alignment system was used to make cut the tibial articular surface perpendicular to the anamotic axis of the tibia and in 3 degrees of posterior slope.  The cut surface and alignment jig was removed.  I then used the intramedullary alignment guide to make a valgus cut on the distal femur.  I then marked out the epicondylar axis on the distal femur.   I then used the anterior referencing sizer and measured the femur to be a size E.  The 4-In-1 cutting block was screwed into place in external rotation matching the posterior condylar angle, making our cuts perpendicular to the epicondylar axis.  Anterior, posterior and chamfer cuts were made with the sagittal saw.  The cutting block and cut pieces were removed.  A lamina spreader was placed in 90 degrees of flexion.  The ACL, PCL, menisci, and posterior condylar osteophytes were removed.  A 10 mm spacer blocked was found to offer good flexion and extension gap balance after minimal in degree  releasing.   The scoop retractor was then placed and the femoral finishing block was pinned in place.  The small sagittal saw was used as well as the lug drill to finish the femur.  The block and cut surfaces were removed and the medullary canal hole filled with autograft bone from the cut pieces.  The tibia was delivered forward in deep flexion and external rotation.  A size 3 tray was selected and pinned into place  centered on the medial 1/3 of the tibial tubercle.  The reamer and keel was used to prepare the tibia through the tray.    I then trialed with the size E femur, size 3 tibia, a 10 mm insert and the 32 patella.  I had excellent flexion/extension gap balance, excellent patella tracking.  Flexion was full and beyond 120 degrees; extension was zero.  These components were chosen and the staff opened them to me on the back table while the knee was lavaged copiously and the cement mixed.  The soft tissue was infiltrated with 60cc of exparel 1.3% through a 21 gauge needle.  I cemented in the components and removed all excess cement.  The polyethylene tibial component was snapped into place and the knee placed in extension while cement was hardening.  The capsule was infilltrated with a 60cc exparel/marcaine/saline mixture.   Once the cement was hard, the tourniquet was let down.  Hemostasis was obtained.  The arthrotomy was closed using a #1 stratofix running suture.  The deep soft tissues were closed with #0 vicryls and the subcuticular layer closed with #2-0 vicryl.  The skin was reapproximated and closed with 3.0 Monocryl.  The wound was covered with steristrips, aquacel dressing, and a TED stocking.   The patient was then awakened, extubated, and taken to the recovery room in stable condition.  BLOOD LOSS:  097DZ COMPLICATIONS:  None.  PLAN OF CARE: Admit for overnight observation  PATIENT DISPOSITION:  PACU - hemodynamically stable.    Please fax a copy of this op note to my office at [phone] (please only include page 1 and 2 of the Case Information op note)

## 2019-03-13 NOTE — Anesthesia Postprocedure Evaluation (Signed)
Anesthesia Post Note  Patient: Jillian Morris  Procedure(s) Performed: TOTAL KNEE ARTHROPLASTY (Left Knee)     Patient location during evaluation: PACU Anesthesia Type: Spinal Level of consciousness: oriented and awake and alert Pain management: pain level controlled Vital Signs Assessment: post-procedure vital signs reviewed and stable Respiratory status: spontaneous breathing, respiratory function stable and patient connected to nasal cannula oxygen Cardiovascular status: blood pressure returned to baseline and stable Postop Assessment: no headache, no backache and no apparent nausea or vomiting Anesthetic complications: no    Last Vitals:  Vitals:   03/13/19 1145 03/13/19 1200  BP: 136/69 (!) 143/73  Pulse: 85 85  Resp: 15 15  Temp:  (!) 36.4 C  SpO2: 94% 96%    Last Pain:  Vitals:   03/13/19 1200  TempSrc:   PainSc: 0-No pain                 Anothony Bursch S

## 2019-03-13 NOTE — H&P (Signed)
Jillian Morris MRN:  [id number] DOB/SEX:  1946/01/17/female  CHIEF COMPLAINT:  Painful left Knee  HISTORY: Patient is a 73 y.o. female presented with a history of pain in the left knee. Onset of symptoms was gradual starting a few years ago with gradually worsening course since that time. Patient has been treated conservatively with over-the-counter NSAIDs and activity modification. Patient currently rates pain in the knee at 10 out of 10 with activity. There is pain at night.  PAST MEDICAL HISTORY: Patient Active Problem List   Diagnosis Date Noted  . Altered mental status 12/06/2018  . GAD (generalized anxiety disorder) 07/10/2018  . Insomnia 07/10/2018  . Bipolar II disorder ([place]) 04/28/2018  . Sjogren's disease ([place]) 06/29/2017  . DDD (degenerative disc disease), cervical 06/29/2017  . DDD (degenerative disc disease), lumbar 06/29/2017  . Primary osteoarthritis of left knee 06/29/2017  . History of total knee replacement, right 06/29/2017  . Age-related osteoporosis without current pathological fracture 06/29/2017  . Essential tremor 06/29/2017  . History of TIA (transient ischemic attack) 06/29/2017  . 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  . Fall 04/20/2016  . Multiple facial fractures ([place]) 04/20/2016  . Right wrist fracture 04/20/2016  . Closed fracture of right distal radius   . Closed fracture of zygomatic arch ([place])   . Depression   . Fibromyalgia   . Traumatic subarachnoid bleed with LOC of 1 hour to 5 hours 59 minutes ([place]) 04/16/2016  . Achalasia 08/09/2013  . Stricture and stenosis of esophagus 03/30/2012  . Dysphagia, unspecified(787.20) 03/21/2012  . Special screening for malignant neoplasms, colon 03/21/2012  . Esophageal reflux 03/21/2012   Past Medical History:  Diagnosis Date  . Acne rosacea   . Anemia    s/p gastric bypass, malabsorption  . Anxiety   . Arthritis   . B12 deficiency   . Chronic fatigue fibromyalgia syndrome   . Chronic low back pain    c5  stenosis, bulging disc , arthritis   . Depression   . Dysphagia    due to esophageal narrowing, able to take meds with thin liquid sif necessary   . Fibromyalgia   . Frequent falls    last fall was 03-09-2019 , my knee gave out. other times i feel like its vertigo . like a spacey feeling " . denies loc or loss of time , did not hit head   . GERD (gastroesophageal reflux disease)   . Graves disease   . H/O hiatal hernia   . HTN (hypertension)    was briefly on losartan back early 2020 , had a severe reaction and was hospitalized , losartan was dc'd and she report shas not been prescribed any new bp meds as of yet, denies lasting issues from hospitalization   . Hypothyroidism   . IBS (irritable bowel syndrome)    resolved with use of oxymorhpne   . Lymphedema   . Memory loss   . Meralgia paraesthetica   . Sjogren's syndrome ([place])   . Status post dilation of esophageal narrowing   . Stroke [facility]) 2017   "i had a fall and hit my head , and they said i had a bleed " , neuro Dr. Jaynee Eagles    Past Surgical History:  Procedure Laterality Date  . ABDOMINAL HYSTERECTOMY    . APPENDECTOMY    . BALLOON DILATION  03/30/2012   Procedure: BALLOON DILATION;  Surgeon: Inda Castle, MD;  Location: [facility];  Service: Endoscopy;  Laterality: N/A;  . BOTOX INJECTION N/A  12/26/2014   Procedure: BOTOX INJECTION;  Surgeon: Louis Meckelobert D Kaplan, MD;  Location: [facility];  Service: Endoscopy;  Laterality: N/A;  . ENDOMETRIAL ABLATION     x 4  . ESOPHAGEAL MANOMETRY N/A 05/15/2013   Procedure: ESOPHAGEAL MANOMETRY (EM);  Surgeon: Louis Meckelobert D Kaplan, MD;  Location: [facility];  Service: Endoscopy;  Laterality: N/A;  . ESOPHAGOGASTRODUODENOSCOPY  03/30/2012   Procedure: ESOPHAGOGASTRODUODENOSCOPY (EGD);  Surgeon: Louis Meckelobert D Kaplan, MD;  [facility];  Service: Endoscopy;  Laterality: N/A;  . ESOPHAGOGASTRODUODENOSCOPY  05/04/2012   Procedure: ESOPHAGOGASTRODUODENOSCOPY (EGD);  Surgeon: Louis Meckelobert D Kaplan, MD;   [facility];  Service: Endoscopy;  Laterality: N/A;  . ESOPHAGOGASTRODUODENOSCOPY N/A 12/26/2014   Procedure: ESOPHAGOGASTRODUODENOSCOPY (EGD);  Surgeon: Louis Meckelobert D Kaplan, MD;  [facility];  Service: Endoscopy;  Laterality: N/A;  botox injection  . GASTRIC BYPASS    . JOINT REPLACEMENT     right knee   . KNEE ARTHROPLASTY    . LAMINECTOMY     L3-L5  . OPEN REDUCTION INTERNAL FIXATION (ORIF) DISTAL RADIAL FRACTURE Right 04/23/2016   Procedure: OPEN REDUCTION INTERNAL FIXATION (ORIF)  RIGHT DISTAL RADIAL FRACTURE;  Surgeon: Betha LoaKevin Kuzma, MD;  Location: [facility] OR;  Service: Orthopedics;  Laterality: Right;  . ORIF TRIPOD FRACTURE Right 04/19/2016   Procedure: OPEN REDUCTION INTERNAL FIXATION (ORIF) TRIPOD FRACTURE LIEBINGER MIDFACE NO DRILL;  Surgeon: Newman PiesSu Teoh, MD;  Location: [facility] OR;  Service: ENT;  Laterality: Right;  . 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 THYROIDECTOMY       MEDICATIONS:   No medications prior to admission.    ALLERGIES:   Allergies  Allergen Reactions  . Losartan Rash  . Actonel [Risedronate Sodium]     GI Upset  . Ambien [Zolpidem Tartrate] Other (See Comments)    Sleep-walking, hallucinations  . Depakote [Divalproex Sodium]     Hallucinations  . Inderal [Propranolol] Other (See Comments)    Hallucinations   . Nsaids Other (See Comments)    Pt. Had gastric bypass surgery   . Penicillins Other (See Comments)    GI upset. Did it involve swelling of the face/tongue/throat, SOB, or low BP? No Did it involve sudden or severe rash/hives, skin peeling, or any reaction on the inside of your mouth or nose? No Did you need to seek medical attention at a hospital or doctor's office? No When did it last happen?As a child If all above answers are "NO", may proceed with cephalosporin use.    . Silicon Nausea And Vomiting    Thyroid storm  . Adhesive [Tape] Itching and Rash    Please use "paper" tape  . Latex Swelling and Rash    Redness lips   . Nickel Itching and Rash    Contact Dermatitis (intolerance)    REVIEW OF SYSTEMS:  A comprehensive review of systems was negative except for: Musculoskeletal: positive for arthralgias and bone pain   FAMILY HISTORY:   Family History  Problem Relation Age of Onset  . Prostate cancer Maternal Grandfather   . Lymphoma Maternal Uncle   . Heart disease Father   . Heart disease Sister   . Irritable bowel syndrome Sister   . Dementia Mother   . Stroke Maternal Grandmother   . Appendicitis Paternal Grandmother   . Colon cancer Neg Hx     SOCIAL HISTORY:   Social History   Tobacco Use  . Smoking status: Never Smoker  . Smokeless tobacco: Never Used  Substance Use Topics  .  Alcohol use: No    Alcohol/week: 0.0 standard drinks     EXAMINATION:  Vital signs in last 24 hours:    There were no vitals taken for this visit.  General Appearance:    Alert, cooperative, no distress, appears stated age  Head:    Normocephalic, without obvious abnormality, atraumatic  Eyes:    PERRL, conjunctiva/corneas clear, EOM's intact, fundi    benign, both eyes  Ears:    Normal TM's and external ear canals, both ears  Nose:   Nares normal, septum midline, mucosa normal, no drainage    or sinus tenderness  Throat:   Lips, mucosa, and tongue normal; teeth and gums normal  Neck:   Supple, symmetrical, trachea midline, no adenopathy;    thyroid:  no enlargement/tenderness/nodules; no carotid   bruit or JVD  Back:     Symmetric, no curvature, ROM normal, no CVA tenderness  Lungs:     Clear to auscultation bilaterally, respirations unlabored  Chest Wall:    No tenderness or deformity   Heart:    Regular rate and rhythm, S1 and S2 normal, no murmur, rub   or gallop  Breast Exam:    No tenderness, masses, or nipple abnormality  Abdomen:     Soft, non-tender, bowel sounds active all four quadrants,    no masses, no organomegaly  Genitalia:    Normal female without lesion, discharge or tenderness   Rectal:    Normal tone, no masses or tenderness;   guaiac negative stool  Extremities:   Extremities normal, atraumatic, no cyanosis or edema  Pulses:   2+ and symmetric all extremities  Skin:   Skin color, texture, turgor normal, no rashes or lesions  Lymph nodes:   Cervical, supraclavicular, and axillary nodes normal  Neurologic:   CNII-XII intact, normal strength, sensation and reflexes    throughout    Musculoskeletal:  ROM 0-120, Ligaments intact,  Imaging Review Plain radiographs demonstrate severe degenerative joint disease of the left knee. The overall alignment is neutral. The bone quality appears to be good for age and reported activity level.  Assessment/Plan: Primary osteoarthritis, left knee   The patient history, physical examination and imaging studies are consistent with advanced degenerative joint disease of the left knee. The patient has failed conservative treatment.  The clearance notes were reviewed.  After discussion with the patient it was felt that Total Knee Replacement was indicated. The procedure,  risks, and benefits of total knee arthroplasty were presented and reviewed. The risks including but not limited to aseptic loosening, infection, blood clots, vascular injury, stiffness, patella tracking problems complications among others were discussed. The patient acknowledged the explanation, agreed to proceed with the plan.  Preoperative templating of the joint replacement has been completed, documented, and submitted to the Operating Room personnel in order to optimize intra-operative equipment management.    Patient's anticipated LOS is less than 2 midnights, meeting these requirements:  - Lives within 1 hour of care - Has a competent adult at home to recover with post-op recover - NO history of  - Chronic pain requiring opiods  - Diabetes  - Coronary Artery Disease  - Heart failure  - Heart attack  - Stroke  - DVT/VTE  - Cardiac arrhythmia  -  Respiratory Failure/COPD  - Renal failure  - Anemia  - Advanced Liver disease       Guy SandiferColby Alan Robbins 03/13/2019, 6:15 AM

## 2019-03-13 NOTE — Transfer of Care (Signed)
Immediate Anesthesia Transfer of Care Note  Patient: Jillian Morris  Procedure(s) Performed: TOTAL KNEE ARTHROPLASTY (Left Knee)  Patient Location: PACU  Anesthesia Type:Spinal  Level of Consciousness: awake, alert  and oriented  Airway & Oxygen Therapy: Patient Spontanous Breathing and Patient connected to face mask oxygen  Post-op Assessment: Report given to RN and Post -op Vital signs reviewed and stable  Post vital signs: Reviewed and stable  Last Vitals:  Vitals Value Taken Time  BP 116/64 03/13/19 1108  Temp    Pulse 81 03/13/19 1111  Resp 19 03/13/19 1111  SpO2 97 % 03/13/19 1111  Vitals shown include unvalidated device data.  Last Pain:  Vitals:   03/13/19 0906  TempSrc:   PainSc: 4       Patients Stated Pain Goal: 4 (44/01/02 7253)  Complications: No apparent anesthesia complications

## 2019-03-13 NOTE — Anesthesia Procedure Notes (Signed)
Anesthesia Regional Block: Adductor canal block   Pre-Anesthetic Checklist: ,, timeout performed, Correct Patient, Correct Site, Correct Laterality, Correct Procedure, Correct Position, site marked, Risks and benefits discussed,  Surgical consent,  Pre-op evaluation,  At surgeon's request and post-op pain management  Laterality: Left  Prep: chloraprep       Needles:  Injection technique: Single-shot  Needle Type: Echogenic Needle     Needle Length: 9cm      Additional Needles:   Procedures:,,,, ultrasound used (permanent image in chart),,,,  Narrative:  Start time: 03/13/2019 8:57 AM End time: 03/13/2019 9:05 AM Injection made incrementally with aspirations every 5 mL.  Performed by: Personally  Anesthesiologist: Myrtie Soman, MD  Additional Notes: Patient tolerated the procedure well without complications

## 2019-03-13 NOTE — Progress Notes (Signed)
Assisted Dr. Rose with left, ultrasound guided, adductor canal block. Side rails up, monitors on throughout procedure. See vital signs in flow sheet. Tolerated Procedure well.  

## 2019-03-13 NOTE — Anesthesia Procedure Notes (Signed)
Anesthesia Procedure Image    

## 2019-03-13 NOTE — Anesthesia Procedure Notes (Signed)
Spinal  Patient location during procedure: OR Start time: 03/13/2019 9:26 AM End time: 03/13/2019 9:34 AM Staffing Anesthesiologist: Myrtie Soman, MD Performed: anesthesiologist  Preanesthetic Checklist Completed: patient identified, site marked, surgical consent, pre-op evaluation, timeout performed, IV checked, risks and benefits discussed and monitors and equipment checked Spinal Block Patient position: sitting Prep: Betadine Patient monitoring: heart rate, continuous pulse ox and blood pressure Approach: right paramedian Location: L2-3 Injection technique: single-shot Needle Needle type: Spinocan  Needle gauge: 22 G Needle length: 9 cm Additional Notes Expiration date of kit checked and confirmed. Patient tolerated procedure well, without complications.

## 2019-03-13 NOTE — Discharge Instructions (Signed)
Information on my medicine - ELIQUIS (apixaban)  This medication education was reviewed with me or my healthcare representative as part of my discharge preparation.  The pharmacist that spoke with me during my hospital stay was:    Why was Eliquis prescribed for you? Eliquis was prescribed for you to reduce the risk of blood clots forming after orthopedic surgery.    What do You need to know about Eliquis? Take your Eliquis TWICE DAILY - one tablet in the morning and one tablet in the evening with or without food.  It would be best to take the dose about the same time each day.  If you have difficulty swallowing the tablet whole please discuss with your pharmacist how to take the medication safely.  Take Eliquis exactly as prescribed by your doctor and DO NOT stop taking Eliquis without talking to the doctor who prescribed the medication.  Stopping without other medication to take the place of Eliquis may increase your risk of developing a clot.  After discharge, you should have regular check-up appointments with your healthcare provider that is prescribing your Eliquis.  What do you do if you miss a dose? If a dose of ELIQUIS is not taken at the scheduled time, take it as soon as possible on the same day and twice-daily administration should be resumed.  The dose should not be doubled to make up for a missed dose.  Do not take more than one tablet of ELIQUIS at the same time.  Important Safety Information A possible side effect of Eliquis is bleeding. You should call your healthcare provider right away if you experience any of the following: Bleeding from an injury or your nose that does not stop. Unusual colored urine (red or dark brown) or unusual colored stools (red or black). Unusual bruising for unknown reasons. A serious fall or if you hit your head (even if there is no bleeding).  Some medicines may interact with Eliquis and might increase your risk of bleeding or  clotting while on Eliquis. To help avoid this, consult your healthcare provider or pharmacist prior to using any new prescription or non-prescription medications, including herbals, vitamins, non-steroidal anti-inflammatory drugs (NSAIDs) and supplements.  This website has more information on Eliquis (apixaban): http://[email or web address].com/eliquis/home      Dr. Brian Swinteck Joint Replacement Specialist [facility] [address] [place], [place] [address] [phone]   TOTAL HIP REPLACEMENT POSTOPERATIVE DIRECTIONS    Hip Rehabilitation, Guidelines Following Surgery   WEIGHT BEARING Weight bearing as tolerated with assist device (walker, cane, etc) as directed, use it as long as suggested by your surgeon or therapist, typically at least 4-6 weeks.  The results of a hip operation are greatly improved after range of motion and muscle strengthening exercises. Follow all safety measures which are given to protect your hip. If any of these exercises cause increased pain or swelling in your joint, decrease the amount until you are comfortable again. Then slowly increase the exercises. Call your caregiver if you have problems or questions.   HOME CARE INSTRUCTIONS  Most of the following instructions are designed to prevent the dislocation of your new hip.  Remove items at home which could result in a fall. This includes throw rugs or furniture in walking pathways.  Continue medications as instructed at time of discharge. You may have some home medications which will be placed on hold until you complete the course of blood thinner medication. You may start showering once you are discharged home.   Do not remove your dressing. Do not put on socks or shoes without following the instructions of your caregivers.   Sit on chairs with arms. Use the chair arms to help push yourself up when arising.  Arrange for the use of a toilet seat elevator so you are not sitting low.   Walk with walker as instructed.  You may resume a sexual relationship in one month or when given the OK by your caregiver.  Use walker as long as suggested by your caregivers.  You may put full weight on your legs and walk as much as is comfortable. Avoid periods of inactivity such as sitting longer than an hour when not asleep. This helps prevent blood clots.  You may return to work once you are cleared by your surgeon.  Do not drive a car for 6 weeks or until released by your surgeon.  Do not drive while taking narcotics.  Wear elastic stockings for two weeks following surgery during the day but you may remove then at night.  Make sure you keep all of your appointments after your operation with all of your doctors and caregivers. You should call the office at the above phone number and make an appointment for approximately two weeks after the date of your surgery. Please pick up a stool softener and laxative for home use as long as you are requiring pain medications. ICE to the affected hip every three hours for 30 minutes at a time and then as needed for pain and swelling. Continue to use ice on the hip for pain and swelling from surgery. You may notice swelling that will progress down to the foot and ankle.  This is normal after surgery.  Elevate the leg when you are not up walking on it.   It is important for you to complete the blood thinner medication as prescribed by your doctor. Continue to use the breathing machine which will help keep your temperature down.  It is common for your temperature to cycle up and down following surgery, especially at night when you are not up moving around and exerting yourself.  The breathing machine keeps your lungs expanded and your temperature down.  RANGE OF MOTION AND STRENGTHENING EXERCISES  These exercises are designed to help you keep full movement of your hip joint. Follow your caregiver's or physical therapist's instructions. Perform all exercises  about fifteen times, three times per day or as directed. Exercise both hips, even if you have had only one joint replacement. These exercises can be done on a training (exercise) mat, on the floor, on a table or on a bed. Use whatever works the best and is most comfortable for you. Use music or television while you are exercising so that the exercises are a pleasant break in your day. This will make your life better with the exercises acting as a break in routine you can look forward to.  Lying on your back, slowly slide your foot toward your buttocks, raising your knee up off the floor. Then slowly slide your foot back down until your leg is straight again.  Lying on your back spread your legs as far apart as you can without causing discomfort.  Lying on your side, raise your upper leg and foot straight up from the floor as far as is comfortable. Slowly lower the leg and repeat.  Lying on your back, tighten up the muscle in the front of your thigh (quadriceps muscles). You can do this by keeping your   leg straight and trying to raise your heel off the floor. This helps strengthen the largest muscle supporting your knee.  Lying on your back, tighten up the muscles of your buttocks both with the legs straight and with the knee bent at a comfortable angle while keeping your heel on the floor.   SKILLED REHAB INSTRUCTIONS: If the patient is transferred to a skilled rehab facility following release from the hospital, a list of the current medications will be sent to the facility for the patient to continue.  When discharged from the skilled rehab facility, please have the facility set up the patient's Home Health Physical Therapy prior to being released. Also, the skilled facility will be responsible for providing the patient with their medications at time of release from the facility to include their pain medication and their blood thinner medication. If the patient is still at the rehab facility at time of the  two week follow up appointment, the skilled rehab facility will also need to assist the patient in arranging follow up appointment in our office and any transportation needs.  POST-OPERATIVE OPIOID TAPER INSTRUCTIONS: It is important to wean off of your opioid medication as soon as possible. If you do not need pain medication after your surgery it is ok to stop day one. Opioids include: Codeine, Hydrocodone(Norco, Vicodin), Oxycodone(Percocet, oxycontin) and hydromorphone amongst others.  Long term and even short term use of opiods can cause: Increased pain response Dependence Constipation Depression Respiratory depression And more.  Withdrawal symptoms can include Flu like symptoms Nausea, vomiting And more Techniques to manage these symptoms Hydrate well Eat regular healthy meals Stay active Use relaxation techniques(deep breathing, meditating, yoga) Do Not substitute Alcohol to help with tapering If you have been on opioids for less than two weeks and do not have pain than it is ok to stop all together.  Plan to wean off of opioids This plan should start within one week post op of your joint replacement. Maintain the same interval or time between taking each dose and first decrease the dose.  Cut the total daily intake of opioids by one tablet each day Next start to increase the time between doses. The last dose that should be eliminated is the evening dose.    MAKE SURE YOU:  Understand these instructions.  Will watch your condition.  Will get help right away if you are not doing well or get worse.  Pick up stool softner and laxative for home use following surgery while on pain medications. Do not remove your dressing. The dressing is waterproof--it is OK to take showers. Continue to use ice for pain and swelling after surgery. Do not use any lotions or creams on the incision until instructed by your surgeon. Total Hip Protocol.  

## 2019-03-14 DIAGNOSIS — M1712 Unilateral primary osteoarthritis, left knee: Secondary | ICD-10-CM | POA: Diagnosis not present

## 2019-03-14 LAB — CBC
HCT: 27.1 % — ABNORMAL LOW (ref 36.0–46.0)
Hemoglobin: 7.8 g/dL — ABNORMAL LOW (ref 12.0–15.0)
MCH: 26 pg (ref 26.0–34.0)
MCHC: 28.8 g/dL — ABNORMAL LOW (ref 30.0–36.0)
MCV: 90.3 fL (ref 80.0–100.0)
Platelets: 179 10*3/uL (ref 150–400)
RBC: 3 MIL/uL — ABNORMAL LOW (ref 3.87–5.11)
RDW: 15.2 % (ref 11.5–15.5)
WBC: 6.4 10*3/uL (ref 4.0–10.5)
nRBC: 0 % (ref 0.0–0.2)

## 2019-03-14 LAB — BASIC METABOLIC PANEL
Anion gap: 8 (ref 5–15)
BUN: 15 mg/dL (ref 8–23)
CO2: 23 mmol/L (ref 22–32)
Calcium: 8.1 mg/dL — ABNORMAL LOW (ref 8.9–10.3)
Chloride: 107 mmol/L (ref 98–111)
Creatinine, Ser: 0.99 mg/dL (ref 0.44–1.00)
GFR calc Af Amer: >60 mL/min (ref >60)
GFR calc non Af Amer: 56 mL/min — ABNORMAL LOW (ref >60)
Glucose, Bld: 122 mg/dL — ABNORMAL HIGH (ref 70–99)
Potassium: 4.3 mmol/L (ref 3.5–5.1)
Sodium: 138 mmol/L (ref 135–145)

## 2019-03-14 MED ORDER — OXYCODONE HCL 5 MG PO TABS: 5.0000 mg | ORAL_TABLET | Freq: Four times a day (QID) | ORAL | 0 refills | Status: DC | PRN

## 2019-03-14 MED ORDER — TIZANIDINE HCL 4 MG PO TABS: 4.0000 mg | ORAL_TABLET | Freq: Four times a day (QID) | ORAL | 0 refills | Status: DC | PRN

## 2019-03-14 MED ORDER — APIXABAN 2.5 MG PO TABS: 2.5000 mg | ORAL_TABLET | Freq: Two times a day (BID) | ORAL | 0 refills | Status: DC

## 2019-03-14 NOTE — Progress Notes (Signed)
Physical Therapy Treatment Patient Details Name: Jillian Morris MRN: [id number] DOB: 05-18-1946 Today's Date: 03/14/2019    History of Present Illness s/p L TKA. PMH: bipolar, fibromyalgia    PT Comments    Progressing with mobility. Reviewed/practiced exercises, gait training, and stair training. Moderate pain with activity. All education completed.  Okay to d/c from PT standpoint-made RN aware.   Follow Up Recommendations  Follow surgeon's recommendation for DC plan and follow-up therapies; Supervision for OOB/mobility     Equipment Recommendations  Rolling walker with 5" wheels    Recommendations for Other Services       Precautions / Restrictions Precautions Precautions: Fall;Knee Restrictions Weight Bearing Restrictions: No Other Position/Activity Restrictions: WBAT    Mobility  Bed Mobility Overal bed mobility: Needs Assistance Bed Mobility: Supine to Sit;Sit to Supine     Supine to sit: Min guard;HOB elevated Sit to supine: Min guard;HOB elevated   General bed mobility comments: Close guard for safety. Increased time.  Transfers Overall transfer level: Needs assistance Equipment used: Rolling walker (2 wheeled) Transfers: Sit to/from Stand Sit to Stand: Min guard;From elevated surface         General transfer comment: Close guard for safety. VCs safety, hand placement  Ambulation/Gait Ambulation/Gait assistance: Min guard;Min assist Gait Distance (Feet): 100 Feet Assistive device: Rolling walker (2 wheeled) Gait Pattern/deviations: Step-through pattern;Decreased stride length     General Gait Details: Intermittent assist to steady. Cues for safety.   Stairs Stairs: Yes Stairs assistance: Min assist Stair Management: One rail Left;Step to pattern;Sideways Number of Stairs: 2 General stair comments: up and over portable stairs. vcs safety, technique, assist to steady.   Wheelchair Mobility    Modified Rankin (Stroke Patients Only)        Balance Overall balance assessment: Needs assistance         Standing balance support: Bilateral upper extremity supported Standing balance-Leahy Scale: Poor                              Cognition Arousal/Alertness: Awake/alert Behavior During Therapy: WFL for tasks assessed/performed Overall Cognitive Status: Within Functional Limits for tasks assessed                                        Exercises Total Joint Exercises Ankle Circles/Pumps: AROM;Both;10 reps;Supine Quad Sets: AROM;Both;10 reps;Supine Heel Slides: AAROM;Left;10 reps;Supine Hip ABduction/ADduction: AROM;Left;10 reps;Supine Straight Leg Raises: AROM;Left;10 reps;Supine Long Arc Quad: AROM;Left;10 reps;Supine Knee Flexion: AAROM;Left;10 reps;Seated Goniometric ROM: ~5-60 degrees    General Comments        Pertinent Vitals/Pain Pain Assessment: 0-10 Pain Score: 5  Pain Location: L TKA Pain Descriptors / Indicators: Sore Pain Intervention(s): Monitored during session;Ice applied    Home Living                      Prior Function            PT Goals (current goals can now be found in the care plan section) Progress towards PT goals: Progressing toward goals    Frequency    7X/week      PT Plan Current plan remains appropriate    Co-evaluation              AM-PAC PT "6 Clicks" Mobility   Outcome Measure  Help needed turning from your back to  your side while in a flat bed without using bedrails?: A Little Help needed moving from lying on your back to sitting on the side of a flat bed without using bedrails?: A Little Help needed moving to and from a bed to a chair (including a wheelchair)?: A Little Help needed standing up from a chair using your arms (e.g., wheelchair or bedside chair)?: A Little Help needed to walk in hospital room?: A Little Help needed climbing 3-5 steps with a railing? : A Little 6 Click Score: 18    End of  Session Equipment Utilized During Treatment: Gait belt Activity Tolerance: Patient tolerated treatment well Patient left: in bed;with call bell/phone within reach   PT Visit Diagnosis: Unsteadiness on feet (R26.81);Difficulty in walking, not elsewhere classified (R26.2)     Time: 4098-11911029-1101 PT Time Calculation (min) (ACUTE ONLY): 32 min  Charges:  $Gait Training: 8-22 mins $Therapeutic Exercise: 8-22 mins                       Rebeca AlertJannie Aleigha Gilani, PT Acute Rehabilitation Services Pager: [phone] Office: [phone]

## 2019-03-14 NOTE — Progress Notes (Signed)
Physical Therapy Progress Note  Clinical Impression: Patient seen for LE strengthening exercises. Patient tolerated therex well with cueing and demonstration. PT provided pt with HEP handout and reviewed in full with pt. PT also provided patient education re: positioning, ice and car transfers with demonstration. Pt would continue to benefit from skilled physical therapy services at this time while admitted and after d/c to address the below listed limitations in order to improve overall safety and independence with functional mobility.  Sherie Don, [place], DPT  Acute Rehabilitation Services Pager [phone] Office [phone]    03/14/19 1200  PT Visit Information  Last PT Received On 03/14/19  Assistance Needed +1  History of Present Illness Pt is a 73 y/o female s/p L TKA on 03/13/19. PMH: bipolar and fibromyalgia.  Precautions  Precautions Fall;Knee  Precaution Booklet Issued Yes (comment)  Precaution Comments PT reviewed positioning of LE following knee surgery with pt  Restrictions  Weight Bearing Restrictions Yes  LLE Weight Bearing WBAT  Pain Assessment  Pain Assessment 0-10  Pain Score 5  Pain Location L knee  Pain Descriptors / Indicators Sore  Pain Intervention(s) Monitored during session;Repositioned  Cognition  Arousal/Alertness Awake/alert  Behavior During Therapy WFL for tasks assessed/performed  Overall Cognitive Status Within Functional Limits for tasks assessed  Exercises  Exercises Total Joint  Total Joint Exercises  Ankle Circles/Pumps AROM;Both;10 reps;Supine  Quad Sets AROM;Strengthening;Left;10 reps;Supine  Heel Slides AAROM;Left;10 reps;Supine  Hip ABduction/ADduction AROM;Left;10 reps;Supine  Straight Leg Raises AAROM;Left;10 reps;Supine  Short Arc Quad AROM;Strengthening;Left;10 reps;Supine  PT - End of Session  Activity Tolerance Patient tolerated treatment well  Patient left in bed;with call bell/phone within reach  Nurse Communication  Mobility status  CPM Left Knee  CPM Left Knee Off   PT - Assessment/Plan  PT Plan Current plan remains appropriate  PT Visit Diagnosis Other abnormalities of gait and mobility (R26.89);Pain  Pain - Right/Left Left  Pain - part of body Knee  PT Frequency (ACUTE ONLY) 7X/week  Follow Up Recommendations Supervision for mobility/OOB;Home health PT  PT equipment Rolling walker with 5" wheels  AM-PAC PT "6 Clicks" Mobility Outcome Measure (Version 2)  Help needed turning from your back to your side while in a flat bed without using bedrails? 3  Help needed moving from lying on your back to sitting on the side of a flat bed without using bedrails? 3  Help needed moving to and from a bed to a chair (including a wheelchair)? 3  Help needed standing up from a chair using your arms (e.g., wheelchair or bedside chair)? 3  Help needed to walk in hospital room? 3  Help needed climbing 3-5 steps with a railing?  3  6 Click Score 18  Consider Recommendation of Discharge To: Home with [facility]  PT Goal Progression  Progress towards PT goals Progressing toward goals  Acute Rehab PT Goals  PT Goal Formulation With patient  Time For Goal Achievement 03/20/19  Potential to Achieve Goals Good  PT Time Calculation  PT Start Time (ACUTE ONLY) 0848  PT Stop Time (ACUTE ONLY) 0908  PT Time Calculation (min) (ACUTE ONLY) 20 min  PT General Charges  $$ ACUTE PT VISIT 1 Visit  PT Treatments  $Therapeutic Exercise 8-22 mins

## 2019-03-14 NOTE — Progress Notes (Signed)
SPORTS MEDICINE AND JOINT REPLACEMENT  Georgena SpurlingStephen Lucey, MD    Laurier Nancyolby Robbins, PA-C [address], [place], [place]  [id number]                             [phone]   PROGRESS NOTEnegative Subjective:  negative for Chest Pain  negative for Shortness of Breath  negative for Nausea/Vomiting   negative for Calf Pain  negative for Bowel Movement   Tolerating Diet: yes         Patient reports pain as 4 on 0-10 scale.    Objective: Vital signs in last 24 hours:    Patient Vitals for the past 24 hrs:  BP Temp Temp src Pulse Resp SpO2  03/14/19 0529 120/69 97.9 F (36.6 C) Oral 87 17 98 %  03/14/19 0132 127/74 98.7 F (37.1 C) Oral 91 19 97 %  03/13/19 2155 125/79 98 F (36.7 C) - 77 17 94 %  03/13/19 2147 134/73 98.3 F (36.8 C) - 96 17 98 %  03/13/19 2035 - - - 88 16 -  03/13/19 1530 (!) 158/82 (!) 97.5 F (36.4 C) Oral 91 16 100 %  03/13/19 1432 133/74 97.7 F (36.5 C) Oral 92 16 97 %  03/13/19 1328 (!) 161/78 (!) 97.5 F (36.4 C) Oral 89 16 100 %  03/13/19 1230 (!) 148/72 97.7 F (36.5 C) Axillary 86 14 95 %  03/13/19 1200 (!) 143/73 (!) 97.5 F (36.4 C) - 85 15 96 %  03/13/19 1145 136/69 - - 85 15 94 %  03/13/19 1130 125/72 - - 82 14 99 %  03/13/19 1115 123/65 - - 82 17 100 %  03/13/19 1109 - - - 83 15 93 %  03/13/19 1108 116/64 98 F (36.7 C) - 81 (!) 21 95 %  03/13/19 0906 128/64 - - 79 14 100 %  03/13/19 0900 (!) 142/64 - - 79 18 95 %  03/13/19 0858 140/74 - - 78 16 98 %    @flow {1959:LAST@   Intake/Output from previous day:   07/20 0701 - 07/21 0700 In: 4091.4 [P.O.:580; I.V.:3111.4] Out: 1900 [Urine:1850]   Intake/Output this shift:   No intake/output data recorded.   Intake/Output      07/20 0701 - 07/21 0700 07/21 0701 - 07/22 0700   P.O. 580    I.V. (mL/kg) 3111.4 (36.3)    IV Piggyback 400    Total Intake(mL/kg) 4091.4 (47.7)    Urine (mL/kg/hr) 1850 (0.9)    Blood 50    Total Output 1900    Net +2191.4            LABORATORY  DATA: Recent Labs    03/10/19 1626 03/14/19 0303  WBC 3.8* 6.4  HGB 9.9* 7.8*  HCT 33.0* 27.1*  PLT 222 179   Recent Labs    03/10/19 1626 03/14/19 0303  NA 141 138  K 4.6 4.3  CL 106 107  CO2 26 23  BUN 14 15  CREATININE 1.10* 0.99  GLUCOSE 113* 122*  CALCIUM 9.5 8.1*   Lab Results  Component Value Date   INR 0.9 12/07/2018   INR 0.89 02/04/2011   INR 0.90 12/29/2010    Examination:  General appearance: alert, cooperative and no distress Extremities: extremities normal, atraumatic, no cyanosis or edema  Wound Exam: clean, dry, intact   Drainage:  None: wound tissue dry  Motor Exam: Quadriceps and Hamstrings Intact  Sensory Exam:  Superficial Peroneal, Deep Peroneal and Tibial normal   Assessment:    1 Day Post-Op  Procedure(s) (LRB): TOTAL KNEE ARTHROPLASTY (Left)  ADDITIONAL DIAGNOSIS:  Active Problems:   S/P total knee replacement     Plan: Physical Therapy as ordered Weight Bearing as Tolerated (WBAT)  DVT Prophylaxis:  eliquis  DISCHARGE PLAN: Home        Patient's anticipated LOS is less than 2 midnights, meeting these requirements: - Lives within 1 hour of care - Has a competent adult at home to recover with post-op recover - NO history of  - Chronic pain requiring opiods  - Diabetes  - Coronary Artery Disease  - Heart failure  - Heart attack  - Stroke  - DVT/VTE  - Cardiac arrhythmia  - Respiratory Failure/COPD  - Renal failure  - Anemia  - Advanced Liver disease        Donia Ast 03/14/2019, 8:03 AM

## 2019-03-15 ENCOUNTER — Other Ambulatory Visit: Payer: Medicare Other

## 2019-03-16 NOTE — Discharge Summary (Signed)
SPORTS MEDICINE & JOINT REPLACEMENT   Jillian SpurlingStephen Lucey, MD   Jillian Nancyolby Jaymere Alen, PA-C [address], [place], [place]  [id number]                             [phone]  PATIENT ID: Jillian GoodyBrenda L Morris        MRN:  [id number]          DOB/AGE: 75/10/1945 / 73 y.o.    DISCHARGE SUMMARY  ADMISSION DATE:    03/13/2019 DISCHARGE DATE:   03/14/2019   ADMISSION DIAGNOSIS: LT. KNEE OSTEOARTHRITIS    DISCHARGE DIAGNOSIS:  LT. KNEE OSTEOARTHRITIS    ADDITIONAL DIAGNOSIS: Active Problems:   S/P total knee replacement  Past Medical History:  Diagnosis Date  . Acne rosacea   . Anemia    s/p gastric bypass, malabsorption  . Anxiety   . Arthritis   . B12 deficiency   . Chronic fatigue fibromyalgia syndrome   . Chronic low back pain    c5 stenosis, bulging disc , arthritis   . Depression   . Dysphagia    due to esophageal narrowing, able to take meds with thin liquid sif necessary   . Fibromyalgia   . Frequent falls    last fall was 03-09-2019 , my knee gave out. other times i feel like its vertigo . like a spacey feeling " . denies loc or loss of time , did not hit head   . GERD (gastroesophageal reflux disease)   . Graves disease   . H/O hiatal hernia   . HTN (hypertension)    was briefly on losartan back early 2020 , had a severe reaction and was hospitalized , losartan was dc'd and she report shas not been prescribed any new bp meds as of yet, denies lasting issues from hospitalization   . Hypothyroidism   . IBS (irritable bowel syndrome)    resolved with use of oxymorhpne   . Lymphedema   . Memory loss   . Meralgia paraesthetica   . Sjogren's syndrome (HCC)   . Status post dilation of esophageal narrowing   . Stroke [facility](HCC) 2017   "i had a fall and hit my head , and they said i had a bleed " , neuro Dr. Lucia GaskinsAhern     PROCEDURE: Procedure(s): TOTAL KNEE ARTHROPLASTY on 03/13/2019  CONSULTS:    HISTORY:  See H&P in chart  HOSPITAL COURSE:  Jillian Morris is a 73 y.o. admitted  on 03/13/2019 and found to have a diagnosis of LT. KNEE OSTEOARTHRITIS.  After appropriate laboratory studies were obtained  they were taken to the operating room on 03/13/2019 and underwent Procedure(s): TOTAL KNEE ARTHROPLASTY.   They were given perioperative antibiotics:  Anti-infectives (From admission, onward)   Start     Dose/Rate Route Frequency Ordered Stop   03/13/19 1530  clindamycin (CLEOCIN) IVPB 600 mg     600 mg 100 mL/hr over 30 Minutes Intravenous Every 6 hours 03/13/19 1222 03/13/19 2201   03/13/19 0630  ceFAZolin (ANCEF) IVPB 2g/100 mL premix     2 g 200 mL/hr over 30 Minutes Intravenous On call to O.R. 03/13/19 [id number] 03/13/19 [id number]    .  Patient given tranexamic acid IV or topical and exparel intra-operatively.  Tolerated the procedure well.    POD# 1: Vital signs were stable.  Patient denied Chest pain, shortness of breath, or calf pain.  Patient was started on Aspirin twice daily at  8am.  Consults to PT, OT, and care management were made.  The patient was weight bearing as tolerated.  CPM was placed on the operative leg 0-90 degrees for 6-8 hours a day. When out of the CPM, patient was placed in the foam block to achieve full extension. Incentive spirometry was taught.  Dressing was changed.       POD #2, Continued  PT for ambulation and exercise program.  IV saline locked.  O2 discontinued.    The remainder of the hospital course was dedicated to ambulation and strengthening.   The patient was discharged on 1 day post op in  Good condition.  Blood products given:none  DIAGNOSTIC STUDIES: Recent vital signs: No data found.     Recent laboratory studies: Recent Labs    03/10/19 1626 03/14/19 0303  WBC 3.8* 6.4  HGB 9.9* 7.8*  HCT 33.0* 27.1*  PLT 222 179   Recent Labs    03/10/19 1626 03/14/19 0303  NA 141 138  K 4.6 4.3  CL 106 107  CO2 26 23  BUN 14 15  CREATININE 1.10* 0.99  GLUCOSE 113* 122*  CALCIUM 9.5 8.1*   Lab Results  Component Value  Date   INR 0.9 12/07/2018   INR 0.89 02/04/2011   INR 0.90 12/29/2010     Recent Radiographic Studies :  No results found.  DISCHARGE INSTRUCTIONS: Discharge Instructions    Call MD / Call 911   Complete by: As directed    If you experience chest pain or shortness of breath, CALL 911 and be transported to the hospital emergency room.  If you develope a fever above 101 F, pus (white drainage) or increased drainage or redness at the wound, or calf pain, call your surgeon's office.   Constipation Prevention   Complete by: As directed    Drink plenty of fluids.  Prune juice may be helpful.  You may use a stool softener, such as Colace (over the counter) 100 mg twice a day.  Use MiraLax (over the counter) for constipation as needed.   Diet - low sodium heart healthy   Complete by: As directed    Discharge instructions   Complete by: As directed    INSTRUCTIONS AFTER JOINT REPLACEMENT   Remove items at home which could result in a fall. This includes throw rugs or furniture in walking pathways ICE to the affected joint every three hours while awake for 30 minutes at a time, for at least the first 3-5 days, and then as needed for pain and swelling.  Continue to use ice for pain and swelling. You may notice swelling that will progress down to the foot and ankle.  This is normal after surgery.  Elevate your leg when you are not up walking on it.   Continue to use the breathing machine you got in the hospital (incentive spirometer) which will help keep your temperature down.  It is common for your temperature to cycle up and down following surgery, especially at night when you are not up moving around and exerting yourself.  The breathing machine keeps your lungs expanded and your temperature down.   DIET:  As you were doing prior to hospitalization, we recommend a well-balanced diet.  DRESSING / WOUND CARE / SHOWERING  Keep the surgical dressing until follow up.  The dressing is water proof, so  you can shower without any extra covering.  IF THE DRESSING FALLS OFF or the wound gets wet inside, change the dressing  with sterile gauze.  Please use good hand washing techniques before changing the dressing.  Do not use any lotions or creams on the incision until instructed by your surgeon.    ACTIVITY  Increase activity slowly as tolerated, but follow the weight bearing instructions below.   No driving for 6 weeks or until further direction given by your physician.  You cannot drive while taking narcotics.  No lifting or carrying greater than 10 lbs. until further directed by your surgeon. Avoid periods of inactivity such as sitting longer than an hour when not asleep. This helps prevent blood clots.  You may return to work once you are authorized by your doctor.     WEIGHT BEARING   Weight bearing as tolerated with assist device (walker, cane, etc) as directed, use it as long as suggested by your surgeon or therapist, typically at least 4-6 weeks.   EXERCISES  Results after joint replacement surgery are often greatly improved when you follow the exercise, range of motion and muscle strengthening exercises prescribed by your doctor. Safety measures are also important to protect the joint from further injury. Any time any of these exercises cause you to have increased pain or swelling, decrease what you are doing until you are comfortable again and then slowly increase them. If you have problems or questions, call your caregiver or physical therapist for advice.   Rehabilitation is important following a joint replacement. After just a few days of immobilization, the muscles of the leg can become weakened and shrink (atrophy).  These exercises are designed to build up the tone and strength of the thigh and leg muscles and to improve motion. Often times heat used for twenty to thirty minutes before working out will loosen up your tissues and help with improving the range of motion but do not  use heat for the first two weeks following surgery (sometimes heat can increase post-operative swelling).   These exercises can be done on a training (exercise) mat, on the floor, on a table or on a bed. Use whatever works the best and is most comfortable for you.    Use music or television while you are exercising so that the exercises are a pleasant break in your day. This will make your life better with the exercises acting as a break in your routine that you can look forward to.   Perform all exercises about fifteen times, three times per day or as directed.  You should exercise both the operative leg and the other leg as well.   Exercises include:   Quad Sets - Tighten up the muscle on the front of the thigh (Quad) and hold for 5-10 seconds.   Straight Leg Raises - With your knee straight (if you were given a brace, keep it on), lift the leg to 60 degrees, hold for 3 seconds, and slowly lower the leg.  Perform this exercise against resistance later as your leg gets stronger.  Leg Slides: Lying on your back, slowly slide your foot toward your buttocks, bending your knee up off the floor (only go as far as is comfortable). Then slowly slide your foot back down until your leg is flat on the floor again.  Angel Wings: Lying on your back spread your legs to the side as far apart as you can without causing discomfort.  Hamstring Strength:  Lying on your back, push your heel against the floor with your leg straight by tightening up the muscles of your buttocks.  Repeat,  but this time bend your knee to a comfortable angle, and push your heel against the floor.  You may put a pillow under the heel to make it more comfortable if necessary.   A rehabilitation program following joint replacement surgery can speed recovery and prevent re-injury in the future due to weakened muscles. Contact your doctor or a physical therapist for more information on knee rehabilitation.    CONSTIPATION  Constipation is  defined medically as fewer than three stools per week and severe constipation as less than one stool per week.  Even if you have a regular bowel pattern at home, your normal regimen is likely to be disrupted due to multiple reasons following surgery.  Combination of anesthesia, postoperative narcotics, change in appetite and fluid intake all can affect your bowels.   YOU MUST use at least one of the following options; they are listed in order of increasing strength to get the job done.  They are all available over the counter, and you may need to use some, POSSIBLY even all of these options:    Drink plenty of fluids (prune juice may be helpful) and high fiber foods Colace 100 mg by mouth twice a day  Senokot for constipation as directed and as needed Dulcolax (bisacodyl), take with full glass of water  Miralax (polyethylene glycol) once or twice a day as needed.  If you have tried all these things and are unable to have a bowel movement in the first 3-4 days after surgery call either your surgeon or your primary doctor.    If you experience loose stools or diarrhea, hold the medications until you stool forms back up.  If your symptoms do not get better within 1 week or if they get worse, check with your doctor.  If you experience "the worst abdominal pain ever" or develop nausea or vomiting, please contact the office immediately for further recommendations for treatment.   ITCHING:  If you experience itching with your medications, try taking only a single pain pill, or even half a pain pill at a time.  You can also use Benadryl over the counter for itching or also to help with sleep.   TED HOSE STOCKINGS:  Use stockings on both legs until for at least 2 weeks or as directed by physician office. They may be removed at night for sleeping.  MEDICATIONS:  See your medication summary on the "After Visit Summary" that nursing will review with you.  You may have some home medications which will be placed  on hold until you complete the course of blood thinner medication.  It is important for you to complete the blood thinner medication as prescribed.  PRECAUTIONS:  If you experience chest pain or shortness of breath - call 911 immediately for transfer to the hospital emergency department.   If you develop a fever greater that 101 F, purulent drainage from wound, increased redness or drainage from wound, foul odor from the wound/dressing, or calf pain - CONTACT YOUR SURGEON.                                                   FOLLOW-UP APPOINTMENTS:  If you do not already have a post-op appointment, please call the office for an appointment to be seen by your surgeon.  Guidelines for how soon to be seen are listed  in your "After Visit Summary", but are typically between 1-4 weeks after surgery.  OTHER INSTRUCTIONS:   Knee Replacement:  Do not place pillow under knee, focus on keeping the knee straight while resting. CPM instructions: 0-90 degrees, 2 hours in the morning, 2 hours in the afternoon, and 2 hours in the evening. Place foam block, curve side up under heel at all times except when in CPM or when walking.  DO NOT modify, tear, cut, or change the foam block in any way.  MAKE SURE YOU:  Understand these instructions.  Get help right away if you are not doing well or get worse.    Thank you for letting us be a part of your medical care team.  It is a privilege we respect greatly.  We hope these instructions will help you stay on track for a fast and full recovery!   Increase activity slowly as tolerated   Complete by: As directed       DISCHARGE MEDICATIONS:   Allergies as of 03/14/2019      Reactions   Losartan Rash   Actonel [risedronate Sodium]    GI Upset   Ambien [zolpidem Tartrate] Other (See Comments)   Sleep-walking, hallucinations   Depakote [divalproex Sodium]    Hallucinations   Inderal [propranolol] Other (See Comments)   Hallucinations    Nsaids Other (See Comments)    Pt. Had gastric bypass surgery    Penicillins Other (See Comments)   GI upset. Did it involve swelling of the face/tongue/throat, SOB, or low BP? No Did it involve sudden or severe rash/hives, skin peeling, or any reaction on the inside of your mouth or nose? No Did you need to seek medical attention at a hospital or doctor's office? No When did it last happen?As a child If all above answers are "NO", may proceed with cephalosporin use.   Silicon Nausea And Vomiting   Thyroid storm   Adhesive [tape] Itching, Rash   Please use "paper" tape   Latex Swelling, Rash   Redness lips   Nickel Itching, Rash   Contact Dermatitis (intolerance)      Medication List    STOP taking these medications   diclofenac sodium 1 % Gel Commonly known as: VOLTAREN   methocarbamol 750 MG tablet Commonly known as: ROBAXIN   oxymorphone 5 MG tablet Commonly known as: OPANA     TAKE these medications   ALPRAZolam 0.5 MG tablet Commonly known as: XANAX Take 0.5 mg by mouth 2 (two) times daily as needed for anxiety.   apixaban 2.5 MG Tabs tablet Commonly known as: ELIQUIS Take 1 tablet (2.5 mg total) by mouth every 12 (twelve) hours.   diphenhydrAMINE 25 MG tablet Commonly known as: BENADRYL Take 50 mg by mouth every 6 (six) hours as needed for allergies.   DULoxetine 60 MG capsule Commonly known as: CYMBALTA TAKE 2 CAPSULES (120 MG TOTAL) BY MOUTH EVERY EVENING.   hydrocortisone valerate ointment 0.2 % Commonly known as: WEST-CORT Apply 1 application topically 2 (two) times daily as needed (skin irritation/rash (contact dermatitis)).   lamoTRIgine 100 MG tablet Commonly known as: LAMICTAL TAKE 3 TABLETS (300 MG TOTAL) BY MOUTH AT BEDTIME.   levothyroxine 125 MCG tablet Commonly known as: SYNTHROID Take 125 mcg by mouth daily before breakfast.   LORazepam 1 MG tablet Commonly known as: ATIVAN Take 1 mg at bedtime by mouth.   meclizine 12.5 MG tablet Commonly known as:  ANTIVERT Take 12.5 mg by mouth 3 (three) times  daily as needed for dizziness.   multivitamin with minerals Tabs tablet Take 1 tablet by mouth daily.   oxyCODONE 5 MG immediate release tablet Commonly known as: Oxy IR/ROXICODONE Take 1-2 tablets (5-10 mg total) by mouth every 6 (six) hours as needed for moderate pain (pain score 4-6).   PROBIOTIC DAILY PO Take 1 capsule by mouth daily.   rOPINIRole 1 MG tablet Commonly known as: REQUIP Take 1 mg by mouth at bedtime as needed (restless leg syndrome).   simethicone 80 MG chewable tablet Commonly known as: MYLICON Chew 80 mg by mouth every 6 (six) hours as needed for flatulence.   SINEX ULTRA FINE MIST REGULAR NA Place 1 spray into the nose daily as needed (congestion).   tetrahydrozoline 0.05 % ophthalmic solution Place 1 drop into both eyes 3 (three) times daily as needed (dry/irritated eyes).   tiZANidine 4 MG tablet Commonly known as: ZANAFLEX Take 1 tablet (4 mg total) by mouth every 6 (six) hours as needed.       FOLLOW UP VISIT:    DISPOSITION: HOME VS. SNF  CONDITION:  Good   Guy SandiferColby Alan Kline Bulthuis 03/16/2019, 9:28 AM

## 2019-03-17 ENCOUNTER — Encounter (HOSPITAL_COMMUNITY): Payer: Self-pay | Admitting: Orthopedic Surgery

## 2019-03-23 ENCOUNTER — Other Ambulatory Visit: Payer: Self-pay | Admitting: Psychiatry

## 2019-03-24 NOTE — Telephone Encounter (Signed)
Still hasn't gotten appt, last visit 05/2018

## 2019-03-31 ENCOUNTER — Ambulatory Visit
Admission: RE | Admit: 2019-03-31 | Discharge: 2019-03-31 | Disposition: A | Discharge status: Home / Self Care | Payer: Medicare Other | Source: Ambulatory Visit | Attending: Family Medicine | Admitting: Family Medicine

## 2019-03-31 ENCOUNTER — Other Ambulatory Visit: Payer: Self-pay

## 2019-03-31 DIAGNOSIS — Z1231 Encounter for screening mammogram for malignant neoplasm of breast: Secondary | ICD-10-CM

## 2019-04-01 ENCOUNTER — Ambulatory Visit
Admit: 2019-04-01 | Discharge: 2019-04-01 | Disposition: A | Discharge status: Home / Self Care | Payer: Medicare Other | Attending: Neurology | Admitting: Neurology

## 2019-04-01 DIAGNOSIS — R413 Other amnesia: Secondary | ICD-10-CM

## 2019-04-04 ENCOUNTER — Telehealth: Payer: Self-pay | Admitting: Neurology

## 2019-04-04 NOTE — Telephone Encounter (Signed)
I called the patient. The MRI of the brain shows mild SVD and atrophy, slightly progressed since 2017.  I discussed this with the patient.  The patient did have a fall with subarachnoid bleeding in 2017 which may have exacerbated her memory problem but the atrophy pattern is more consistent with Alzheimer's disease.  The patient's mother also had Alzheimer's.   MRI brain 04/03/19:  IMPRESSION: This MRI of the brain without contrast shows the following: 1.   Moderate generalized cortical atrophy that is most pronounced in the mesial temporal lobes.  The atrophy has progressed compared to the 2017 MRI. 2.   T2/flair hyperintense foci in the pons and hemispheres in a pattern most consistent with chronic microvascular ischemic change.  None of the foci appear to be acute though there is mild progression compared to the 2017 MRI. 3.   No acute findings.

## 2019-05-15 ENCOUNTER — Other Ambulatory Visit: Payer: Self-pay | Admitting: Psychiatry

## 2019-05-16 NOTE — Telephone Encounter (Signed)
Still overdue for appt last visit 05/2018

## 2019-05-18 ENCOUNTER — Ambulatory Visit: Payer: Self-pay | Admitting: Neurology

## 2019-05-22 ENCOUNTER — Encounter: Payer: Self-pay | Admitting: Neurology

## 2019-05-30 ENCOUNTER — Ambulatory Visit: Payer: Medicare Other | Admitting: Neurology

## 2019-05-30 ENCOUNTER — Other Ambulatory Visit: Payer: Self-pay

## 2019-05-30 ENCOUNTER — Encounter: Payer: Self-pay | Admitting: Neurology

## 2019-05-30 VITALS — BP 161/85 | HR 91 | Ht 64.0 in | Wt 180.0 lb

## 2019-05-30 DIAGNOSIS — R413 Other amnesia: Secondary | ICD-10-CM | POA: Diagnosis not present

## 2019-05-30 NOTE — Patient Instructions (Signed)
We will request a formal cognitive test called neuropsychological evaluation which is done by a licensed neuropsychologist. I will make a referral in that regard and their office will call to schedule you for an appointment. Please be reminded, that this is a lengthy appointment.

## 2019-05-30 NOTE — Progress Notes (Signed)
Subjective:    Jillian Morris ID: Jillian Morris is a 73 y.o. female.  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  The Jillian Morris is accompanied by Jillian Morris husband today.  Of note, she missed an appointment on 05/18/2019. I last saw Jillian Morris on 02/15/2019 after a long gap of almost 3 years.  She had been hospitalized in April 2020 secondary to rash and fever.  She was noted to have altered mental status and acute kidney injury.  She was advised to discontinue Xanax.  Upon discharge Jillian Morris creatinine had improved but was still mildly elevated.  Jillian Morris MMSE was 27 out of 30 at the time.  I suggested we proceed with a brain MRI and neuropsychological evaluation.  She felt that Jillian Morris memory had declined over the past year.  She also felt that Jillian Morris mobility and balance had become worse.   She had a brain MRI without contrast on 04/01/2019 and I reviewed the results: IMPRESSION: This MRI of the brain without contrast shows the following: 1.   Moderate generalized cortical atrophy that is most pronounced in the mesial temporal lobes.  The atrophy has progressed compared to the 2017 MRI. 2.   T2/flair hyperintense foci in the pons and hemispheres in a pattern most consistent with chronic microvascular ischemic change.  None of the foci appear to be acute though there is mild progression compared to the 2017 MRI. 3.   No acute findings.  Dr. Jannifer Franklin  called Jillian Morris with Jillian Morris test results in my absence.  She has not had neuropsychological evaluation.  Today, 05/30/2019: She reports feeling stable with regards to Jillian Morris memory.  Jillian Morris husband believes that she has done a little better after Jillian Morris Synthroid was adjusted.  She had interim left total knee replacement on 03/13/2019  Under Dr. Ronnie Derby, and she has done well thus far, she is in physical therapy and is working with them also on Jillian Morris balance.  She tries to hydrate well with water.   Previously:  She canceled an interim appointment in January 2018 and no showed for an appointment on 11/17/2016 as well as 02/04/2017. She is re-referred by Jillian Morris primary care physician, Dr. Shirline Frees. I saw Jillian Morris on 03/09/2016, at which time she reported more problems with Jillian Morris memory, confusion while driving and several bouts of vertigo in the interim. She was re-referred by Jillian Morris PCP at the time.  She was having trouble with Jillian Morris thyroid medication and thyroid function. She was getting B12 injections on a monthly basis. She was seeing Dr. Hardin Negus for pain management. She had some falls but thankfully without injuries reported. I had a long chat with Jillian Morris at the time regarding multifactorial gait disorder, fall risk and memory dysfunction. Jillian Morris MMSE was: 29/30, CDT: 4/4, AFT: 13/min.    I suggested a brain MRI and EEG. Jillian Morris EEG from 03/12/2016 showed: Impression: This is an essentially normal EEG recording in the waking state. Movement artifact was prominent during the recording. No evidence of ictal or interictal discharges are seen.   We  called Jillian Morris with Jillian Morris test results.    She had a brain MRI without contrast on 03/22/2016 which showed: IMPRESSION:  This MRI of the brain without contrast shows the following: 1.   T2/FLAIR hyperintense foci in the pons and the hemispheres most consistent with chronic microvascular ischemic change. The extent has increased since the MRI from 2009. 2.    Moderate cortical atrophy most pronounced in  the mesial temporal lobes. The extent of atrophy has increased when compared to the MRI dated 08/07/2008. 3.    There are no acute findings.   We called Jillian Morris with Jillian Morris test results.   Of note, she fell in the interim. She was hospitalized in August 2017 after a fall with significant injuries. She was in the hospital from 04/16/2016 through 04/20/2016 and then transferred to rehabilitation. She fell at a doctor's office and sustained injuries including right distal radius fracture and traumatic subarachnoid hemorrhage, she had facial injuries including facial Fx, and was seen by ENT, neurosurgery, hand surgery, rehabilitation doctor and ophthalmology. She was on the trauma service. She was advised to follow-up as an outpatient with neurosurgery, ophthalmology, ENT, hand surgery.    She was in rehabilitation inpatient from 04/20/2016 through 05/01/2016.   She had a carotid Doppler ultrasound on 10/13/2016 which showed less than 50% stenosis of bilateral ICAs.    She had a follow-up head CT on 04/17/2016 which I reviewed: IMPRESSION: Stable small volume subarachnoid hemorrhage in the parafalcine right frontal lobe and left quadrigeminal plate cistern.   No new hemorrhage or acute abnormality.  No hydrocephalus.   I first met Jillian Morris on 06/06/2014, at which time she reported a long-standing history of balance issues and memory issues. Symptoms were progressive. She had fallen in the past and had physical therapy when she had had Jillian Morris knee replacement surgery. She had missed some recent B12 injections per husband's report.    06/06/2014: She reports a longstanding history of balance issues and memory issues. She feels Jillian Morris short-term memory has suffered. She feels Jillian Morris condition has progressed. She does report having fallen in the past. She had physical therapy in the past when she had Jillian Morris knee replacement surgery, but not more recently. Jillian Morris husband notes that she has missed a couple of B12 injection  appointments as I understand.   She had a CT head with without contrast on 08/19/2012: No acute intracranial abnormality. Stable mild atrophy and chronic microvascular change.   She had Jillian Morris lumbar spine MRI without contrast on 07/24/2009: 1. Little interval change from prior exam with slight progression of left foraminal stenosis at L3-L4 compared to the prior exam of 2009. 2. Laminectomy and posterior decompression extending from L3-L4 through L5-S1. No recurrent central or lateral recess stenosis. Foraminal stenosis as described above. 3. In this Jillian Morris with right radicular symptoms, question right L4 radiculitis associated with compression in the right L4-L5 neural foramen. She had a brain MRI with and without contrast and a brain MRA without contrast on 03/16/2002 ordered by Dr. Erling Cruz with the indication of confusion, dizziness, syncope and fainting: These were reported as normal.   She had previously seen Dr. Erling Cruz in our office, last in 2009. I reviewed the office note from 07/26/2008, at which time she presented for balance problems and falls. In July and August 2008 she underwent neuropsychological evaluation for complaints of memory problems and test results were in keeping with normal intellectual functioning. Dr. Erling Cruz started Jillian Morris on aspirin. An MMSE score was 30 out of 30  at the time. She had seen Dr. Erling Cruz back in June 2008 for balance problems and falls. Jillian Morris MMSE was 30 out of 30 at the time, clock drawing was 4 out of 4. He did not notice any significant gait disorder at the time and suspected that in view of Jillian Morris history of gastric bypass, peripheral neuropathy should be suspected and he ordered an EMG and nerve conduction test as well as EEG. He check TSH, RPR and vitamin B12 level as well as methylmalonic acid. She had an EMG and nerve conduction test on 02/10/2007 which I reviewed. This was done by Dr. 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 She had an EEG on 02/10/2007 which was interpreted as normal EEG during the awake and drowsy states. MRI of the cervical spine from 03/17/2007 showed prominent spondylolytic changes throughout most noticeable at C5-6 and C6-7 with mild canal stenosis at C5-6 with no significant foraminal stenosis, root encroachment or cord signal abnormality.   Jillian Morris Past Medical History Is Significant For: Past Medical History:  Diagnosis Date  . Acne rosacea   . Anemia    s/p gastric bypass, malabsorption  . Anxiety   . Arthritis   . B12 deficiency   . Chronic fatigue fibromyalgia syndrome   . Chronic low back pain    c5 stenosis, bulging disc , arthritis   . Depression   . Dysphagia    due to esophageal narrowing, able to take meds with thin liquid sif necessary   . Fibromyalgia   . Frequent falls    last fall was 03-09-2019 , my knee gave out. other times i feel like its vertigo . like a spacey feeling " . denies loc or loss of time , did not hit head   . GERD (gastroesophageal reflux disease)   . Graves disease   . H/O hiatal hernia   . HTN (hypertension)    was briefly on losartan back early 2020 , had a severe reaction and was hospitalized , losartan was dc'd and she report shas not been prescribed any new bp meds as of yet, denies lasting issues from hospitalization   . Hypothyroidism   . IBS (irritable bowel syndrome)    resolved with use of oxymorhpne   . Lymphedema   . Memory loss   . Meralgia paraesthetica   . Sjogren's syndrome ([place])   . Status post dilation of esophageal narrowing   . Stroke [facility]) 2017   "i had a fall and hit my head , and they said i had a bleed " , neuro Dr. Jaynee Eagles     Jillian Morris Past Surgical History Is Significant For: Past Surgical History:  Procedure Laterality Date  . ABDOMINAL HYSTERECTOMY    . APPENDECTOMY    . AUGMENTATION MAMMAPLASTY Bilateral    in Jillian Morris 40's pt had a  reaction to Jillian Morris implants, she had them removed  . BALLOON DILATION  03/30/2012   Procedure: BALLOON DILATION;  Surgeon: Inda Castle, MD;  Location: [facility];  Service: Endoscopy;  Laterality: N/A;  . BOTOX INJECTION N/A 12/26/2014   Procedure: BOTOX INJECTION;  Surgeon: Inda Castle, MD;  Location: [facility];  Service: Endoscopy;  Laterality: N/A;  . ENDOMETRIAL ABLATION     x 4  . ESOPHAGEAL MANOMETRY N/A 05/15/2013   Procedure: ESOPHAGEAL MANOMETRY (EM);  Surgeon: Inda Castle, MD;  Location: [facility];  Service:  Endoscopy;  Laterality: N/A;  . ESOPHAGOGASTRODUODENOSCOPY  03/30/2012   Procedure: ESOPHAGOGASTRODUODENOSCOPY (EGD);  Surgeon: Inda Castle, MD;  Location: [facility];  Service: Endoscopy;  Laterality: N/A;  . ESOPHAGOGASTRODUODENOSCOPY  05/04/2012   Procedure: ESOPHAGOGASTRODUODENOSCOPY (EGD);  Surgeon: Inda Castle, MD;  Location: [facility];  Service: Endoscopy;  Laterality: N/A;  . ESOPHAGOGASTRODUODENOSCOPY N/A 12/26/2014   Procedure: ESOPHAGOGASTRODUODENOSCOPY (EGD);  Surgeon: Inda Castle, MD;  Location: [facility];  Service: Endoscopy;  Laterality: N/A;  botox injection  . GASTRIC BYPASS    . JOINT REPLACEMENT     right knee   . KNEE ARTHROPLASTY    . LAMINECTOMY     L3-L5  . OPEN REDUCTION INTERNAL FIXATION (ORIF) DISTAL RADIAL FRACTURE Right 04/23/2016   Procedure: OPEN REDUCTION INTERNAL FIXATION (ORIF)  RIGHT DISTAL RADIAL FRACTURE;  Surgeon: Leanora Cover, MD;  Location: [facility];  Service: Orthopedics;  Laterality: Right;  . ORIF TRIPOD FRACTURE Right 04/19/2016   Procedure: OPEN REDUCTION INTERNAL FIXATION (ORIF) TRIPOD FRACTURE LIEBINGER MIDFACE NO DRILL;  Surgeon: Leta Baptist, MD;  Location: [facility];  Service: ENT;  Laterality: Right;  . 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 THYROIDECTOMY    . TOTAL KNEE ARTHROPLASTY Left 03/13/2019   Procedure: TOTAL KNEE ARTHROPLASTY;  Surgeon: Vickey Huger, MD;  Location: [facility];  Service: Orthopedics;   Laterality: Left;    Jillian Morris Family History Is Significant For: Family History  Problem Relation Age of Onset  . Prostate cancer Maternal Grandfather   . Lymphoma Maternal Uncle   . Heart disease Father   . Heart disease Sister   . Irritable bowel syndrome Sister   . Dementia Mother   . Stroke Maternal Grandmother   . Appendicitis Paternal Grandmother   . Colon cancer Neg Hx     Jillian Morris Social History Is Significant For: Social History   Socioeconomic History  . Marital status: Married    Spouse name: Not on file  . Number of children: 0  . Years of education: Not on file  . Highest education level: Not on file  Occupational History  . Occupation: housewife  Social Needs  . Financial resource strain: Not hard at all  . Food insecurity    Worry: Never true    Inability: Never true  . Transportation needs    Medical: No    Non-medical: No  Tobacco Use  . Smoking status: Never Smoker  . Smokeless tobacco: Never Used  Substance and Sexual Activity  . Alcohol use: No    Alcohol/week: 0.0 standard drinks  . Drug use: No  . Sexual activity: Not on file  Lifestyle  . Physical activity    Days per week: 0 days    Minutes per session: 0 min  . Stress: Only a little  Relationships  . Social connections    Talks on phone: More than three times a week    Gets together: Once a week    Attends religious service: More than 4 times per year    Active member of club or organization: Yes    Attends meetings of clubs or organizations: More than 4 times per year    Relationship status: Married  Other Topics Concern  . Not on file  Social History Narrative   Right handed, Married, Step kids 2, Caffeine 4-5 cups daily, graduate school   Rare caffeine use     Jillian Morris Allergies Are:  Allergies  Allergen Reactions  . Losartan Rash  . Actonel [  Risedronate Sodium]     GI Upset  . Ambien [Zolpidem Tartrate] Other (See Comments)    Sleep-walking, hallucinations  . Depakote [Divalproex  Sodium]     Hallucinations  . Inderal [Propranolol] Other (See Comments)    Hallucinations   . Nsaids Other (See Comments)    Pt. Had gastric bypass surgery   . Penicillins Other (See Comments)    GI upset. Did it involve swelling of the face/tongue/throat, SOB, or low BP? No Did it involve sudden or severe rash/hives, skin peeling, or any reaction on the inside of your mouth or nose? No Did you need to seek medical attention at a hospital or doctor's office? No When did it last happen?As a child If all above answers are "NO", may proceed with cephalosporin use.    . Silicon Nausea And Vomiting    Thyroid storm  . Adhesive [Tape] Itching and Rash    Please use "paper" tape  . Latex Swelling and Rash    Redness lips  . Nickel Itching and Rash    Contact Dermatitis (intolerance)  :   Jillian Morris Current Medications Are:  Outpatient Encounter Medications as of 05/30/2019  Medication Sig  . ALPRAZolam (XANAX) 0.5 MG tablet Take 0.5 mg by mouth 2 (two) times daily as needed for anxiety.  Marland Kitchen amLODipine (NORVASC) 2.5 MG tablet Take 2.5 mg by mouth daily.  . diphenhydrAMINE (BENADRYL) 25 MG tablet Take 50 mg by mouth every 6 (six) hours as needed for allergies.  . DULoxetine (CYMBALTA) 60 MG capsule TAKE 2 CAPSULES (120 MG TOTAL) BY MOUTH EVERY EVENING.  . hydrocortisone valerate ointment (WEST-CORT) 0.2 % Apply 1 application topically 2 (two) times daily as needed (skin irritation/rash (contact dermatitis)).   Marland Kitchen lamoTRIgine (LAMICTAL) 100 MG tablet TAKE 3 TABLETS (300 MG TOTAL) BY MOUTH AT BEDTIME.  Marland Kitchen levothyroxine (SYNTHROID) 125 MCG tablet Take 125 mcg by mouth daily before breakfast.  . LORazepam (ATIVAN) 1 MG tablet Take 1 mg at bedtime by mouth.  . meclizine (ANTIVERT) 12.5 MG tablet Take 12.5 mg by mouth 3 (three) times daily as needed for dizziness.  . Multiple Vitamin (MULTIVITAMIN WITH MINERALS) TABS tablet Take 1 tablet by mouth daily.  Marland Kitchen oxymorphone (OPANA) 5 MG tablet Take 5 mg  by mouth.  . Phenylephrine HCl (SINEX ULTRA FINE MIST REGULAR NA) Place 1 spray into the nose daily as needed (congestion).  . Probiotic Product (PROBIOTIC DAILY PO) Take 1 capsule by mouth daily.  Marland Kitchen rOPINIRole (REQUIP) 1 MG tablet Take 1 mg by mouth at bedtime as needed (restless leg syndrome).   . simethicone (MYLICON) 80 MG chewable tablet Chew 80 mg by mouth every 6 (six) hours as needed for flatulence.  Marland Kitchen tiZANidine (ZANAFLEX) 4 MG tablet Take 1 tablet (4 mg total) by mouth every 6 (six) hours as needed.  . [DISCONTINUED] apixaban (ELIQUIS) 2.5 MG TABS tablet Take 1 tablet (2.5 mg total) by mouth every 12 (twelve) hours.  . [DISCONTINUED] oxyCODONE (OXY IR/ROXICODONE) 5 MG immediate release tablet Take 1-2 tablets (5-10 mg total) by mouth every 6 (six) hours as needed for moderate pain (pain score 4-6).  . [DISCONTINUED] tetrahydrozoline 0.05 % ophthalmic solution Place 1 drop into both eyes 3 (three) times daily as needed (dry/irritated eyes).    No facility-administered encounter medications on file as of 05/30/2019.   :  Review of Systems:  Out of a complete 14 point review of systems, all are reviewed and negative with the exception of these symptoms as  listed below: Review of Systems  Neurological:       Pt presents today to discuss Jillian Morris memory. She feels that Jillian Morris memory has been stable.    Objective:  Neurological Exam  Physical Exam Physical Examination:   Vitals:   05/30/19 1521  BP: (!) 161/85  Pulse: 91    General Examination: The Jillian Morris is a 73 y.o. female in no acute distress. She appears well-developed and well-nourished and well groomed.   HEENT:Normocephalic, atraumatic, pupils are equal, round and reactive to light and accommodation. She has bilateral cataracts. Extraocular tracking is good without limitation to gaze excursion or nystagmus noted. Normal smooth pursuit is noted. Hearing is grossly intact. Face is symmetric with normal facial animation and normal  facial sensation. Speech is clear with no dysarthria noted. There is no hypophonia. There is no lip, neck/head, jaw or voice tremor. Neck is supple with full range of passive and active motion. There are no carotid bruits on auscultation. Oropharynx exam reveals: moderate mouth dryness, adequate dental hygiene and no significant airway crowding. Tongue protrudes centrally in palate elevates symmetrically.  Chest:Clear to auscultation without wheezing, rhonchi or crackles noted.  Heart:S1+S2+0, regular and normal without murmurs, rubs or gallops noted.   Abdomen:Soft, non-tender and non-distended with normal bowel sounds appreciated on auscultation.  Extremities:There is nonpitting puffiness of Jillian Morris distal legs bilaterally.  Skin: Warm and dry without trophic changes noted.   Musculoskeletal: exam reveals no obvious joint deformities, tenderness or joint swelling or erythema.   Neurologically:  Mental status: The Jillian Morris is awake, alert and oriented in all 4 spheres. Jillian Morris immediate and remote memory, attention, language skills and fund of knowledge are mildly impaired. She Is not able to provide details about Jillian Morris history.  On 06/06/14: Jillian Morris MMSE is 28/30.   On 03/09/2016: MMSE: 29/30, CDT: 4/4, AFT: 13/min.   On 02/16/2019: MMSE: 27/30, CDT: 4/4, AFT: 14/min.  There is very mild slowness in thinking noted. Speech shows no hypophonia with normal prosody and enunciation. Thought process is linear. Mood is constricted and affect is blunted, not much different from previous visit.  Cranial nerves II - XII are as described above under HEENT exam. In addition: shoulder shrug is normal with equal shoulder height noted. Motor exam: Normal bulk, strength and tone is noted. There is no drift, No postural or action tremor.  Romberg is not tested for safety reasons. Fine motor skills and coordination: are grossly intact.  Cerebellar testing: No dysmetria or intention tremor on finger. There  is no truncal or gait ataxia.  Sensory exam: intact to light touch.  Gait, station and balance: She stands with Mild difficulty and pushes herself up.  Posture is slightly stooped in the lower back, she walks slowly and cautiously, no walking aid, no obvious limp.   Assessment and Plan:   In summary, ADI SEALES is a 73 year old female with an underlying complex medical history of IBS (irritable bowel syndrome), hypothyroidism, chronic fatigue, FMS, meralgia paraesthetica, chronic low back pain, followed by pain management, on narcotic pain medications, anemia and B12 deficiency, secondary to gastric bypass surgery with malabsorption history, eczema, rosacea, anxiety, arthritis, depression, dysphagia, secondary to esophageal stricture (status post dilation of esophageal narrowing), GERD, stroke, Sjogren's syndrome, Graves disease, Lymphedema and recent 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 Jillian Morris brain MRI in August 2020 showed moderate generalized atrophy, more pronounced in the mid lobes, this raises the possibility that she  is at risk for Alzheimer's dementia.  She had an MMSE of 27 in June 2020.  I would like to proceed with more formal and in-depth memory evaluation.  Jillian Morris situation is complicated because of Jillian Morris medical history and also because of Jillian Morris multiple medications including potentially sedating medications.  She was hospitalized for acute encephalopathy in April 2020, In the context of fever and rash. She had previously seen Dr. Erling Cruz in the past, had an MRI brain in 2003 with unremarkable findings, had memory testing with formal cognitive testing in 2008 with normal results at the time, and previous extensive blood work as well, and EMG and nerve conduction testing.  She is agreeable to the referral to neuropsychology.  We will continue to monitor Jillian Morris memory.  We talked about the importance of healthy lifestyle, good  hydration, good nutrition, enough rest at night. Jillian Morris kidney function was impaired during Jillian Morris hospitalizationIn April 2020. I made a referral to neuropsychology. She is in regular follow-up with Jillian Morris pain management doctor.  We previously did a brain MRI and EEG in 2017. I will see Jillian Morris back in 3 months. I answered all their questions today and the Jillian Morris and Jillian Morris husband were in agreement with the plan. I spent 20 minutes in total face-to-face time with the Jillian Morris, more than 50% of which was spent in counseling and coordination of care, reviewing test results, reviewing medication and discussing or reviewing the diagnosis of memory loss, its prognosis and treatment options. Pertinent laboratory and imaging test results that were available during this visit with the Jillian Morris were reviewed by me and considered in my medical decision making (see chart for details).

## 2019-06-26 ENCOUNTER — Encounter: Payer: Self-pay | Admitting: Psychology

## 2019-07-04 ENCOUNTER — Other Ambulatory Visit: Payer: Self-pay | Admitting: Psychiatry

## 2019-07-05 NOTE — Telephone Encounter (Signed)
Last apt 05/2018 Nothing scheduled

## 2019-07-12 ENCOUNTER — Other Ambulatory Visit: Payer: Self-pay | Admitting: Psychiatry

## 2019-07-13 NOTE — Telephone Encounter (Signed)
Submitted 11/12 this is for the next 3 months

## 2019-08-03 ENCOUNTER — Ambulatory Visit: Payer: Medicare Other

## 2019-08-03 ENCOUNTER — Ambulatory Visit (INDEPENDENT_AMBULATORY_CARE_PROVIDER_SITE_OTHER): Payer: Medicare Other | Admitting: Psychology

## 2019-08-03 ENCOUNTER — Encounter: Payer: Self-pay | Admitting: Psychology

## 2019-08-03 ENCOUNTER — Other Ambulatory Visit: Payer: Self-pay

## 2019-08-03 DIAGNOSIS — F331 Major depressive disorder, recurrent, moderate: Secondary | ICD-10-CM | POA: Diagnosis not present

## 2019-08-03 DIAGNOSIS — F411 Generalized anxiety disorder: Secondary | ICD-10-CM | POA: Diagnosis not present

## 2019-08-03 DIAGNOSIS — R41840 Attention and concentration deficit: Secondary | ICD-10-CM | POA: Diagnosis not present

## 2019-08-03 DIAGNOSIS — G3184 Mild cognitive impairment, so stated: Secondary | ICD-10-CM | POA: Insufficient documentation

## 2019-08-03 DIAGNOSIS — R413 Other amnesia: Secondary | ICD-10-CM

## 2019-08-03 HISTORY — DX: Mild cognitive impairment of uncertain or unknown etiology: G31.84

## 2019-08-03 NOTE — Progress Notes (Addendum)
   Neuropsychology Note   Jillian Morris completed 95 minutes of neuropsychological testing with technician, Cruzita Lederer, B.S., under the supervision of Dr. Christia Reading, Ph.D., licensed neuropsychologist. The patient did not appear overtly distressed by the testing session, per behavioral observation or via self-report to the technician. Rest breaks were offered.    In considering the patient's current level of functioning, level of presumed impairment, nature of symptoms, emotional and behavioral responses during the interview, level of literacy, and observed level of motivation/effort, a battery of tests was selected and communicated to the psychometrician.   Communication between the psychologist and technician was ongoing throughout the testing session and changes were made as deemed necessary based on patient performance on testing, technician observations and additional pertinent factors such as those listed above.   Jillian Morris will return within approximately two weeks for an interactive feedback session with Dr. Melvyn Novas at which time his test performances, clinical impressions, and treatment recommendations will be reviewed in detail. The patient understands she can contact our office should she require our assistance before this time.   Full report to follow.  95 minutes were spent face-to-face with patient administering standardized tests and 15 minutes were spent scoring (technician). [CPT [id number], [id number]]

## 2019-08-03 NOTE — Progress Notes (Addendum)
NEUROPSYCHOLOGICAL EVALUATION . [facility] Department of Neurology  Reason for Referral:   Elie GoodyBrenda L Rogue is a 73 y.o. Caucasian female referred by Huston FoleySaima Athar, M.D., to characterize her current cognitive functioning and assist with diagnostic clarity and treatment planning in the context of subjective cognitive decline, neuroimaging suggesting medial temporal lobe atrophy, and concerns regarding a potential neurodegenerative disease.  Assessment and Plan:   Clinical Impression(s): Ms. Marca AnconaStewart's pattern of performance is suggestive of primary impairments surrounding attention/concentration and semantic fluency. Additional variability was noted across processing speed and cognitive flexibility. Performance across other aspects of executive functioning, receptive language, confrontation naming, phonemic fluency, visuospatial abilities, and learning and memory were within normal limits. Given evidence for cognitive dysfunction, coupled with Ms. Basford's report of intact ability to complete activities of daily living (ADLs), she meets criteria for a Mild Neurocognitive Disorder (formerly "mild cognitive impairment") at the present time. However, the mild severity of this condition should be emphasized currently.  Regarding etiology, Ms. Roseanne RenoStewart reported severe levels of acute anxiety and depression, as well as moderate ongoing sleep disturbances. It is very likely that psychiatric distress and sleep dysfunction are directly related to ongoing weaknesses in attention/concentration and cognitive flexibility. Should these symptoms become optimized, improvement may be possible. Specific to memory, Ms. Roseanne RenoStewart was able to learn novel verbal and visual information efficiently and retain this knowledge after lengthy delays. Overall, her cognitive and behavioral profile is not suggestive of Alzheimer's disease or any other neurodegenerative illness presently. 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 As such, continued monitoring will be important moving forward.  Recommendations: A repeat neuropsychological evaluation in 18-24 months (or sooner if functional decline is noted) is recommended to assess the trajectory of future cognitive decline should it occur. This will also aid in future efforts towards improved diagnostic clarity.  It is possible that results of the current evaluation may alleviate psychiatric distress to a mild extent. In addition, a combination of medication and psychotherapy has been shown to be most effective at treating symptoms of anxiety and depression. As such, Ms. Roseanne RenoStewart is encouraged to speak with her prescribing physician regarding medication adjustments to optimally manage these symptoms. Likewise, Ms. Roseanne RenoStewart is encouraged to consider engaging in short-term psychotherapy to address symptoms of psychiatric distress. Recommended types of therapy include Cognitive Behavioral Therapy (CBT) or Acceptance and Commitment Therapy (ACT).  Referral to a sleep specialist would likely be beneficial if not already done to better understand her abnormal sleep cycles and chronic sleep difficulties. She would also benefit from practicing good sleep hygiene and should consider working with a clinician who practices Cognitive Behavioral Therapy for Insomnia (CBT-I) to help introduce these techniques and improve her overall sleep quality.  Referral for outpatient vestibular therapy may also be beneficial to address variable balance and prevent future falls.   If interested, there are some activities which have therapeutic value and can be useful in keeping her cognitively stimulated. For suggestions, Ms. Roseanne RenoStewart is encouraged to go to the following website:  https://www.[facility].org/get-to-know-barrow/centers-programs/neurorehabilitation-center/neuro-rehab-apps-and-games/ which has options, categorized by level of difficulty. It should be noted that these activities should not be viewed as a substitute for therapy.  To address problems with processing speed, she may wish to consider:   -Scheduling more difficult activities for a time of day when she is usually most alert   -Ensuring that she is alerted  when essential material or instructions are being presented   -Adjusting the speed at which new information is presented   -Allowing additional processing time or a chance to rehearse novel information  To address problems with working memory, she may wish to consider:   -Avoiding external distractions when needing to concentrate   -Limiting exposure to fast paced environments with multiple sensory demands   -Writing down complicated information and using checklists   -Attempting and completing one task at a time (i.e., no multi-tasking)   -Reducing the amount of information considered at one time  Review of Records:   Ms. Hartney was seen by her neurologist Huston Foley, M.D.) on 05/30/2019 for follow-up of memory loss. She had been hospitalized in April 2020 secondary to rash and fever. She was noted to have altered mental status and acute kidney injury. She was advised to discontinue Xanax. Upon discharge her creatinine had improved but was still mildly elevated. Performance on a brief cognitive screening instrument (MMSE) was 27/30 at that time. Overall, she described worsening memory, as well as trouble with balance/coordination. Her husband noted that these seemed to mildly improve after her synthroid was adjusted. Ultimately, she was referred for a comprehensive neuropsychological evaluation to characterize her cognitive abilities and to assist with diagnostic clarity and future treatment planning.   Brain MRI on 03/16/2002 was unremarkable.  Head CT on 01/05/2007 was negative. Head CT on 08/19/2012 revealed stable mild atrophy and chronic microvascular changes. Head CT on 09/03/2014 revealed mild atrophy. Brain MRI on 03/23/2016 revealed T2/FLAIR hyperintense foci in the pons and the hemispheres most consistent with chronic microvascular ischemic change, increased since 2009. Results also revealed moderate cortical atrophy most pronounced in the medial temporal lobes, increased since 2009. Head CT on 04/17/2016 in the context of a fall revealed a small volume subarachnoid hemorrhage in the parafalcine right frontal lobe. Brain MRI on 04/03/2019 revealed progressive small vessel ischemic changes and atrophy in the medial temporal lobes relative to 2017 scans.  Past Medical History:  Diagnosis Date  . Acne rosacea   . Anemia    s/p gastric bypass, malabsorption  . Arthritis   . B12 deficiency   . Bipolar II disorder (HCC) 04/28/2018  . Chronic fatigue fibromyalgia syndrome   . DDD (degenerative disc disease), cervical 06/29/2017  . DDD (degenerative disc disease), lumbar 06/29/2017   s/p laminectomy   . Dysphagia    due to esophageal narrowing, able to take meds with thin liquid sif necessary   . Fibromyalgia   . Frequent falls    last fall was 03-09-2019 , my knee gave out. other times i feel like its vertigo . like a spacey feeling " . denies loc or loss of time , did not hit head   . GAD (generalized anxiety disorder) 07/10/2018  . GERD (gastroesophageal reflux disease)   . Graves disease   . H/O hiatal hernia   . HTN (hypertension)    was briefly on losartan back early 2020 , had a severe reaction and was hospitalized , losartan was dc'd and she report shas not been prescribed any new bp meds as of yet, denies lasting issues from hospitalization   . Hypothyroidism   . IBS (irritable bowel syndrome)    resolved with use of oxymorhpne   . Lymphedema   . Major depressive disorder   . Meralgia paraesthetica   . Sjogren's disease (HCC)  06/29/2017  . Status post dilation of esophageal narrowing   . Traumatic subarachnoid bleed with  LOC of 1 hour to 5 hours 59 minutes (HCC) 04/16/2016    Past Surgical History:  Procedure Laterality Date  . ABDOMINAL HYSTERECTOMY    . APPENDECTOMY    . AUGMENTATION MAMMAPLASTY Bilateral    in her 40's pt had a reaction to her implants, she had them removed  . BALLOON DILATION  03/30/2012   Procedure: BALLOON DILATION;  Surgeon: Louis Meckel, MD;  Location: [facility];  Service: Endoscopy;  Laterality: N/A;  . BOTOX INJECTION N/A 12/26/2014   Procedure: BOTOX INJECTION;  Surgeon: Louis Meckel, MD;  Location: [facility];  Service: Endoscopy;  Laterality: N/A;  . ENDOMETRIAL ABLATION     x 4  . ESOPHAGEAL MANOMETRY N/A 05/15/2013   Procedure: ESOPHAGEAL MANOMETRY (EM);  Surgeon: Louis Meckel, MD;  Location: [facility];  Service: Endoscopy;  Laterality: N/A;  . ESOPHAGOGASTRODUODENOSCOPY  03/30/2012   Procedure: ESOPHAGOGASTRODUODENOSCOPY (EGD);  Surgeon: Louis Meckel, MD;  Location: [facility];  Service: Endoscopy;  Laterality: N/A;  . ESOPHAGOGASTRODUODENOSCOPY  05/04/2012   Procedure: ESOPHAGOGASTRODUODENOSCOPY (EGD);  Surgeon: Louis Meckel, MD;  Location: [facility];  Service: Endoscopy;  Laterality: N/A;  . ESOPHAGOGASTRODUODENOSCOPY N/A 12/26/2014   Procedure: ESOPHAGOGASTRODUODENOSCOPY (EGD);  Surgeon: Louis Meckel, MD;  Location: [facility];  Service: Endoscopy;  Laterality: N/A;  botox injection  . GASTRIC BYPASS    . JOINT REPLACEMENT     right knee   . KNEE ARTHROPLASTY    . LAMINECTOMY     L3-L5  . OPEN REDUCTION INTERNAL FIXATION (ORIF) DISTAL RADIAL FRACTURE Right 04/23/2016   Procedure: OPEN REDUCTION INTERNAL FIXATION (ORIF)  RIGHT DISTAL RADIAL FRACTURE;  Surgeon: Betha Loa, MD;  Location: [facility] OR;  Service: Orthopedics;  Laterality: Right;  . ORIF TRIPOD FRACTURE Right 04/19/2016   Procedure: OPEN REDUCTION INTERNAL FIXATION (ORIF) TRIPOD FRACTURE  LIEBINGER MIDFACE NO DRILL;  Surgeon: Newman Pies, MD;  Location: [facility] OR;  Service: ENT;  Laterality: Right;  . 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 THYROIDECTOMY    . TOTAL KNEE ARTHROPLASTY Left 03/13/2019   Procedure: TOTAL KNEE ARTHROPLASTY;  Surgeon: Dannielle Huh, MD;  Location: [facility];  Service: Orthopedics;  Laterality: Left;    Family History  Problem Relation Age of Onset  . Prostate cancer Maternal Grandfather   . Lymphoma Maternal Uncle   . Heart disease Father   . Heart disease Sister   . Irritable bowel syndrome Sister   . Dementia Mother   . Stroke Maternal Grandmother   . Appendicitis Paternal Grandmother   . Colon cancer Neg Hx      Current Outpatient Medications:  .  ALPRAZolam (XANAX) 0.5 MG tablet, Take 0.5 mg by mouth 2 (two) times daily as needed for anxiety., Disp: , Rfl:  .  amLODipine (NORVASC) 2.5 MG tablet, Take 2.5 mg by mouth daily., Disp: , Rfl:  .  diphenhydrAMINE (BENADRYL) 25 MG tablet, Take 50 mg by mouth every 6 (six) hours as needed for allergies., Disp: , Rfl:  .  DULoxetine (CYMBALTA) 60 MG capsule, TAKE 2 CAPSULES (120 MG TOTAL) BY MOUTH EVERY EVENING., Disp: 180 capsule, Rfl: 0 .  hydrocortisone valerate ointment (WEST-CORT) 0.2 %, Apply 1 application topically 2 (two) times daily as needed (skin irritation/rash (contact dermatitis)). , Disp: , Rfl:  .  lamoTRIgine (LAMICTAL) 100 MG tablet, TAKE 3 TABLETS (300 MG TOTAL) BY MOUTH AT BEDTIME., Disp: 270 tablet, Rfl: 0 .  levothyroxine (SYNTHROID) 125 MCG tablet, Take 125 mcg by  mouth daily before breakfast., Disp: , Rfl:  .  LORazepam (ATIVAN) 1 MG tablet, Take 1 mg at bedtime by mouth., Disp: , Rfl:  .  meclizine (ANTIVERT) 12.5 MG tablet, Take 12.5 mg by mouth 3 (three) times daily as needed for dizziness., Disp: , Rfl:  .  Multiple Vitamin (MULTIVITAMIN WITH MINERALS) TABS tablet, Take 1 tablet by mouth daily., Disp: , Rfl:  .  oxymorphone (OPANA) 5 MG tablet, Take 5 mg by mouth., Disp: , Rfl:  .   Phenylephrine HCl (SINEX ULTRA FINE MIST REGULAR NA), Place 1 spray into the nose daily as needed (congestion)., Disp: , Rfl:  .  Probiotic Product (PROBIOTIC DAILY PO), Take 1 capsule by mouth daily., Disp: , Rfl:  .  rOPINIRole (REQUIP) 1 MG tablet, Take 1 mg by mouth at bedtime as needed (restless leg syndrome). , Disp: , Rfl: 6 .  simethicone (MYLICON) 80 MG chewable tablet, Chew 80 mg by mouth every 6 (six) hours as needed for flatulence., Disp: , Rfl:  .  tiZANidine (ZANAFLEX) 4 MG tablet, Take 1 tablet (4 mg total) by mouth every 6 (six) hours as needed., Disp: 50 tablet, Rfl: 0  Clinical Interview:   Cognitive Symptoms: Decreased short-term memory: Endorsed. Ms. Lynde reported trouble remembering the details of previous conversations (or if those conversations ever took place), names of somewhat familiar individuals, misplacing objects around the home, losing her train of thought, and trouble performing mental calculations. These were said to have been present for the past few years and have gradually declined. They were also noted to appear worse during the past 6 months.  Decreased long-term memory: Denied. Decreased attention/concentration: Endorsed. Difficulties were said to include maintaining her focus and increased ease of distractibility. These were said to have been especially noticeable during the past 2-3 months.  Reduced processing speed: Endorsed. Ms. Arrellano noted this occurring "sometimes." Her husband described her as being somewhat slowed and less sharp at times. Difficulties with executive functions: Endorsed. She noted longstanding difficulties with organization/complex planning, but that these appear to have worsened. She denied difficulties with indecisiveness; however, her husband noted that it may take her longer to make decisions. Impulsivity was denied. Personality changes were noted in that Ms. Okeefe can appear more irritable or frustrated than what is typical for  her. Difficulties with emotion regulation: Denied. Difficulties with receptive language: Denied if she is able to adequately focus on information being presented to her. Difficulties with word finding: Endorsed. She noted prominent difficulties with word finding, as well as having a reduced vocabulary.  Decreased visuoperceptual ability: Denied. However, she later acknowledged some trouble recognizing where her feet are in space around her, making ambulating more challenging.   Difficulties completing ADLs: Largely denied. Her husband manages personal finances, but this appears somewhat longstanding in nature. Trouble managing medications was denied. Ms. Pagliarulo no longer drives. This was said to be due to a single instance where she recently got lost while driving.  Additional Medical History: History of traumatic brain injury/concussion: Endorsed. In August 2017, Ms. Bolick experienced a fall, leading to a small volume subarachnoid hemorrhage in the parafalcine right frontal lobe. She was hospitalized for near 2 weeks before being discharged. She reported a positive loss in consciousness lasting greater than 1 hour, but less than 6. She noted remembering the fall itself. Her next consistent memory was being moved from the ICU to a normal hospital room. She denied her perception that she experienced persisting cognitive deficits stemming from this  injury.  History of stroke: Denied. She alluded to a medical professional stating that her 2017 fall may have been the result of a stroke. However, neuroimaging has not revealed concerns regarding a prior infarct.  History of seizure activity: Denied. History of known exposure to toxins: Denied. Symptoms of chronic pain: Endorsed. She reported chronic back pain, present "all the time." These symptoms were said to be managed well via medications.  Experience of frequent headaches/migraines: Denied. Frequent instances of dizziness/vertigo: Endorsed. She  reported rapid-onset instances of vertigo which have appeared to occur more frequently during the past year.   Sensory changes: She reported appropriate vision since undergoing cataract surgery. She described her left ear having 30% hearing loss. She also noted sporadic instances of diminished senses of both taste and smell.  Balance/coordination difficulties: Endorsed. She noted trouble ambulating when not on solid surfaces and acknowledged a history of several falls. Some of this may be related to ongoing back pain and history of knee replacement surgery.  Other motor difficulties: Denied.  Sleep History: Estimated hours obtained each night: Unclear. Ms. Briguglio and her husband noted that she has a very abnormal sleep cycle, is generally unable to sleep through the night, and often "makes it up" during the day.  Difficulties falling asleep: Endorsed. These are somewhat aided via medication intervention. Difficulties staying asleep: Endorsed. She reported waking up for unknown reasons at times. Other times, this is related to ongoing pain symptoms, where she is unable to fall back asleep. Feels rested and refreshed upon awakening: Denied.  History of snoring: Denied. History of waking up gasping for air: Denied. Witnessed breath cessation while asleep: Denied.  History of vivid dreaming: Denied. Excessive movement while asleep: Denied outside of tossing and turning behaviors in order to get comfortable.  Instances of acting out her dreams: Denied.  Psychiatric/Behavioral Health History: Depression: Endorsed. Ms. Siglin was previously diagnosed with bipolar II disorder and has been treated for depressive episodes throughout her life. Current medications were said to be more helpful at stabilizing manic symptoms and do not appear to address depressive symptoms to an acceptable degree. Recently, she noted increased frustration and depressed mood surrounding cognitive difficulties and concerns  regarding a potential neurodegenerative condition. Current or remote suicidal ideation, intent, or plan was denied.  Anxiety: Endorsed. Symptoms were generalized in nature and said to be noticeable during the past year.  Mania: Endorsed (see above). Trauma History: Denied. Visual/auditory hallucinations: Denied. Delusional thoughts: Denied.  Tobacco: Denied. Alcohol: Ms. Passmore reported very rare alcohol consumption and denied a history of problematic alcohol abuse or dependence.  Recreational drugs: Denied. Caffeine: Denied.  Academic/Vocational History: Highest level of educational attainment: 18 years. Ms. Zaffino earned a Master's degree in rural sociology and statistics from [employer]. 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  History of developmental delay: Denied. History of grade repetition: Denied. Enrollment in special education courses: Denied. History of diagnosed specific learning disability: Denied. History of ADHD: Denied.  Employment: Retired. 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   Evaluation Results:   Behavioral Observations: Ms. Freeman was accompanied by her husband, arrived to her appointment on time, and was appropriately dressed and groomed. Observed gait and station were slowed, but overall within normal limits. Gross motor functioning appeared intact  upon informal observation and no abnormal movements (e.g., tremors) were noted. Her affect was generally relaxed and positive, but did range appropriately given the subject being discussed during the clinical interview or the task at hand during testing procedures. Spontaneous speech was fluent and word finding difficulties were not observed during the clinical interview or  testing procedures. Sustained attention was appropriate throughout. Thought processes were coherent, organized, and normal in content. Task engagement was adequate and she persisted when challenged. Overall, Ms. Colquhoun was cooperative with the clinical interview and subsequent testing procedures.   Adequacy of Effort: The validity of neuropsychological testing is limited by the extent to which the individual being tested may be assumed to have exerted adequate effort during testing. Ms. Guinn expressed her intention to perform to the best of her abilities and exhibited adequate task engagement and persistence. Scores across stand-alone and embedded performance validity measures were variable. However, below expectation scores were very close to appropriate cutoffs and cognitive performance was largely within normal limits outside of isolated domains. As such, the results of the current evaluation are believed to be a valid representation of Ms. Romano's current cognitive functioning.  Test Results: Ms. Goonan was fully oriented at the time of the current evaluation.  Intellectual abilities based upon educational and vocational attainment were estimated to be in the average to above average range. Premorbid abilities were estimated to be within the above average range based upon a single-word reading test.   Processing speed was variable, ranging from the below average to above average normative ranges. Basic attention was below average. More complex attention (e.g., working memory) was well below average. Executive functioning was generally within normal limits. Performance variability was exhibited across cognitive flexibility.  Assessed receptive language abilities were within normal limits. Likewise, Ms. Georgiades did not exhibit any difficulties comprehending task instructions and answered all questions asked of her appropriately. Assessed expressive language (e.g., verbal fluency and  confrontation naming) was variable. Confrontation naming and phonemic fluency were within normal limits, while semantic fluency was well below average.     Assessed visuospatial/visuoconstructional abilities were within normal limits.    Learning (i.e., encoding) of novel verbal and visual information was within normal limits. Spontaneous delayed recall (i.e., retrieval) of previously learned information was commensurate with performance across initial learning trials. Retention rates were appropriate across memory measures. Performance across recognition tasks was also appropriate, suggesting evidence for information consolidation.   Results of emotional screening instruments suggested that recent symptoms of generalized anxiety were in the severe range, while symptoms of depression were also within the severe range. A screening instrument assessing recent sleep quality suggested the presence of moderate sleep dysfunction.  Tables of Scores:   Note: This summary of test scores accompanies the interpretive report and should not be considered in isolation without reference to the appropriate sections in the text. Descriptors are based on appropriate normative data and may be adjusted based on clinical judgment. The terms "impaired" and "within normal limits (WNL)" are used when a more specific level of functioning cannot be determined.       Effort Testing:   DESCRIPTOR       Dot Counting Test: --- --- Below Expectation  CVLT-III Forced Choice Recognition: --- --- Within Expectation  BVMT-R Retention Percentage: --- --- Within Expectation       Orientation:      Raw Score Percentile   NAB Orientation, Form 1 29/29 --- ---       Intellectual Functioning:  Standard Score Percentile   Test of Premorbid Functioning: 118 88 Above Average       Memory:          Wechsler Memory Scale (WMS-IV):                       Raw Score (Scaled Score) Percentile     Logical Memory I 32/53 (11) 63  Average    Logical Memory II 19/39 (11) 63 Average    Logical Memory Recognition 19/23 51-75 Average       [place] Verbal Learning Test (CVLT-III) Brief Form: Raw Score (Scaled/Standard Score) Percentile     Total Trials 1-4 27/36 (106) 66 Average    Short-Delay Free Recall 7/9 (10) 50 Average    Long-Delay Free Recall 8/9 (13) 84 Above Average    Long-Delay Cued Recall 9/9 (15) 95 Well Above Average      Recognition Hits 8/9 (10) 50 Average      False Positive Errors 0 (12) 75 Above Average       Brief Visuospatial Memory Test (BVMT-R), Form 1: Raw Score (T Score) Percentile     Total Trials 1-3 24/36 (57) 75 Above Average    Delayed Recall 8/12 (51) 54 Average    Recognition Discrimination Index 6 >16 Within Normal Limits      Recognition Hits 6/6 >16 Within Normal Limits      False Positive Errors 0 >16 Within Normal Limits        Attention/Executive Function:          Trail Making Test (TMT): Raw Score (T Score) Percentile     Part A 47 secs.,  0 errors (39) 14 Below Average    Part B 123 secs.,  1 error (41) 18 Below Average        Scaled Score Percentile   WAIS-IV Coding: 10 50 Average       NAB Attention Module, Form 1: T Score Percentile     Digits Forward 38 12 Below Average    Digits Backwards 34 5 Well Below Average       D-KEFS Color-Word Interference Test: Raw Score (Scaled Score) Percentile     Color Naming 29 secs. (12) 75 Above Average    Word Reading 23 secs. (11) 63 Average    Inhibition 71 secs. (10) 50 Average      Total Errors 3 errors (10) 50 Average    Inhibition/Switching 70 secs. (11) 63 Average      Total Errors 2 errors (11) 63 Average       D-KEFS 20 Questions Test: Scaled Score Percentile     Total Weighted Achievement Score 9 37 Average    Initial Abstraction Score 7 16 Below Average       Language:          Verbal Fluency Test: Raw Score (Z-Score) Percentile     Phonemic Fluency (FAS) 37 (-0.41) 35 Average    Animal Fluency 10  (-1.95) 3 Well Below Average  *Based on [employer] Older Normative Studies (MOANS)          NAB Language Module, Form 2: T Score Percentile     Auditory Comprehension 54 66 Average    Naming 31/31 (60) 84 Above Average       Visuospatial/Visuoconstruction:      Raw Score Percentile   Clock Drawing: 10/10 --- Within Normal Limits       NAB Spatial Module, Form 2: T Score Percentile  Figure Drawing Copy 50 50 Average        Scaled Score Percentile   WAIS-IV Matrix Reasoning: 10 50 Average       Mood and Personality:      Raw Score Percentile   Geriatric Depression Scale: 20 --- Severe  Geriatric Anxiety Scale: 37 --- Severe    Somatic 16 --- Severe    Cognitive 11 --- Severe    Affective 10 --- Severe       Additional Questionnaires:      Raw Score Percentile   PROMIS Sleep Disturbance Questionnaire: 35 --- Moderate   Informed Consent and Coding/Compliance:   Ms. Swaminathan was provided with a verbal description of the nature and purpose of the present neuropsychological evaluation. Also reviewed were the foreseeable risks and/or discomforts and benefits of the procedure, limits of confidentiality, and mandatory reporting requirements of this provider. The patient was given the opportunity to ask questions and receive answers about the evaluation. Oral consent to participate was provided by the patient.   This evaluation was conducted by Newman Nickels, Ph.D., licensed clinical neuropsychologist. Ms. Vey completed a 40-minute clinical interview, billed as one unit [phone], and 110 minutes of cognitive testing, billed as one unit [phone] and three additional units 96139. Psychometrist Shan Levans, B.S., assisted Dr. Milbert Coulter with test administration and scoring procedures. As a separate and discrete service, Dr. Milbert Coulter spent a total of 180 minutes in interpretation and report writing, billed as one unit 96132 and two units 96133.

## 2019-08-04 ENCOUNTER — Encounter: Payer: Self-pay | Admitting: Psychology

## 2019-08-11 ENCOUNTER — Ambulatory Visit (INDEPENDENT_AMBULATORY_CARE_PROVIDER_SITE_OTHER): Payer: Medicare Other | Admitting: Psychology

## 2019-08-11 ENCOUNTER — Encounter: Payer: Self-pay | Admitting: Psychology

## 2019-08-11 ENCOUNTER — Other Ambulatory Visit: Payer: Self-pay

## 2019-08-11 DIAGNOSIS — R41840 Attention and concentration deficit: Secondary | ICD-10-CM

## 2019-08-11 DIAGNOSIS — F331 Major depressive disorder, recurrent, moderate: Secondary | ICD-10-CM

## 2019-08-11 DIAGNOSIS — F411 Generalized anxiety disorder: Secondary | ICD-10-CM

## 2019-08-11 NOTE — Progress Notes (Signed)
   Neuropsychology Feedback Session Jillian Morris. [facility]  Reason for Referral:   Jillian Morris a 73 y.o. Caucasian female referred by Star Age, M.D.,to characterize hercurrent cognitive functioning and assist with diagnostic clarity and treatment planning in the context of subjective cognitive decline, neuroimaging suggesting medial temporal lobe atrophy, and concerns regarding a potential neurodegenerative disease.  Feedback:   Jillian Morris completed a comprehensive neuropsychological evaluation on 08/03/2019. Please refer to that encounter for the full report and recommendations. Briefly, results suggested primary impairments surrounding attention/concentration and semantic fluency. Additional variability was noted across processing speed and cognitive flexibility. Jillian Morris reported severe levels of acute anxiety and depression, as well as moderate ongoing sleep disturbances. It is very likely that psychiatric distress and sleep dysfunction are directly related to ongoing weaknesses in attention/concentration and cognitive flexibility. Should these symptoms become optimized, improvement may be possible. Specific to memory, Jillian Morris was able to learn novel verbal and visual information efficiently and retain this knowledge after lengthy delays. Overall, her cognitive and behavioral profile is not suggestive of Alzheimer's disease or any other neurodegenerative illness presently. 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   Jillian Morris was accompanied by her husband during the current virtual feedback appointment. Content of the current session focused on the results of her neuropsychological evaluation. Jillian Morris and her husband were given the opportunity to ask questions and her questions were  answered. They were also encouraged to reach out should additional questions arise. A copy of her report was mailed at the conclusion of the visit, along with instructions for how to find an outpatient therapist.      A total of 25 minutes were spent with Jillian Morris during the current feedback session.

## 2019-09-06 ENCOUNTER — Ambulatory Visit: Payer: Medicare Other | Admitting: Neurology

## 2019-09-06 ENCOUNTER — Encounter: Payer: Self-pay | Admitting: Neurology

## 2019-09-06 ENCOUNTER — Other Ambulatory Visit: Payer: Self-pay

## 2019-09-06 VITALS — BP 146/78 | HR 76 | Temp 97.5°F | Ht 64.0 in | Wt 183.0 lb

## 2019-09-06 DIAGNOSIS — R2689 Other abnormalities of gait and mobility: Secondary | ICD-10-CM | POA: Diagnosis not present

## 2019-09-06 DIAGNOSIS — R413 Other amnesia: Secondary | ICD-10-CM

## 2019-09-06 NOTE — Progress Notes (Signed)
Subjective:    Patient ID: Jillian Morris is a 74 y.o. female.  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  The patient is accompanied by her husband today.  I last saw her on 05/29/2028, at which time her memory scores were fairly stable.  She felt stable as well.  She had left total knee replacement in July 2020.  She was in physical therapy.  I suggested we proceed with neuropsychological evaluation.  I made a referral.  She was seen in the interim by Dr. Linden Dolin on 08/03/2019 with testing and a follow-up appointment on 08/11/2019.  I reviewed the test results and recommendations:  << Clinical Impression(s): Jillian Morris pattern of performance is suggestive of primary impairments surrounding attention/concentration and semantic fluency. Additional variability was noted across processing speed and cognitive flexibility. Performance across other aspects of executive functioning, receptive language, confrontation naming, phonemic fluency, visuospatial abilities, and learning and memory were within normal limits. 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 However, the mild severity of this condition  should be emphasized currently.   Regarding etiology, Jillian Morris reported severe levels of acute anxiety and depression, as well as moderate ongoing sleep disturbances. It is very likely that psychiatric distress and sleep dysfunction are directly related to ongoing weaknesses in attention/concentration and cognitive flexibility. Should these symptoms become optimized, improvement may be possible. Specific to memory, Jillian Morris was able to learn novel verbal and visual information efficiently and retain this knowledge after lengthy delays. Overall, her cognitive and behavioral profile is not suggestive of Alzheimer's disease or any other neurodegenerative illness presently. 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 As such, continued monitoring will be important moving forward.   Recommendations: A repeat neuropsychological evaluation in 18-24 months (or sooner if functional decline is noted) is recommended to assess the trajectory of future cognitive decline should it occur. This will also aid in future efforts towards improved diagnostic clarity. >>   Today, 09/06/2019: She reports feeling more or less stable, may be slightly worse with her memory, her balance is no better, she has had no recent medication changes, her psychotropic medications are managed by her primary care physician, Dr. Kenton Kingfisher.  She has seen psychiatry years ago.  She had a sleep study attempted many years ago but did not sleep or could not sleep at the time.  She reports that she was told to come off of her medication for her sleep study right before the sleep study and she did not end up sleeping enough.  She would be willing to consider a sleep study.  She has more anxiety lately secondary to multiple issues including family issues,  also with respect to what is going on politically [place], she  also reports stress in the family in general.    Previously:   Of note, she missed an appointment on 05/18/2019. I saw her on 02/15/2019 after a long gap of almost 3 years.  She had been hospitalized in April 2020 secondary to rash and fever.  She was noted to have altered mental status and acute kidney injury.  She was advised to discontinue Xanax.  Upon discharge her creatinine had improved but was still mildly elevated.  Her MMSE was 27 out of 30 at the time.  I suggested we proceed with a brain MRI and neuropsychological evaluation.  She felt that her memory had declined over the past year.  She also felt that her mobility and balance had become worse.    She had a brain MRI without contrast on 04/01/2019 and I reviewed the results: IMPRESSION: This MRI of the brain without contrast shows the following: 1.   Moderate generalized cortical atrophy that is most pronounced in the mesial temporal lobes.  The atrophy has progressed compared to the 2017 MRI. 2.   T2/flair hyperintense foci in the pons and hemispheres in a pattern most consistent with chronic microvascular ischemic change.  None of the foci appear to be acute though there is mild progression compared to the 2017 MRI. 3.   No acute findings.   Dr. Jannifer Franklin called her with her test results in my absence.   She has not had neuropsychological evaluation.   She canceled an interim appointment in January 2018 and no showed for an appointment on 11/17/2016 as well as 02/04/2017. She is re-referred by her primary care physician, Dr. Shirline Frees. I saw her on 03/09/2016, at which time she reported more problems with her memory, confusion while driving and several bouts of vertigo in the interim. She was re-referred by her PCP at the time.  She was having trouble with her thyroid medication and thyroid function. She was getting B12 injections on a monthly basis. She was seeing Dr. Hardin Negus for pain management. She had some falls but  thankfully without injuries reported. I had a long chat with her at the time regarding multifactorial gait disorder, fall risk and memory dysfunction. Her MMSE was: 29/30, CDT: 4/4, AFT: 13/min.    I suggested a brain MRI and EEG. Her EEG from 03/12/2016 showed: Impression: This is an essentially normal EEG recording in the waking state. Movement artifact was prominent during the recording. No evidence of ictal or interictal discharges are seen.   We called her with her test results.    She had a brain MRI without contrast on 03/22/2016 which showed: IMPRESSION:  This MRI of the brain without contrast shows the following: 1.   T2/FLAIR hyperintense foci in the pons and the hemispheres most consistent with chronic microvascular ischemic change. The extent has increased since the MRI from 2009. 2.    Moderate cortical atrophy most pronounced in the mesial temporal lobes. The extent of atrophy has increased when compared to the MRI dated 08/07/2008. 3.    There are no acute findings.   We called her with her test results.   Of note, she fell in the interim. She was hospitalized in August 2017 after a fall with significant injuries. She was in the hospital from 04/16/2016 through 04/20/2016 and then transferred to rehabilitation. She fell at a doctor's office and sustained injuries including right distal radius fracture and traumatic subarachnoid hemorrhage, she had facial injuries including facial Fx, and was seen by ENT, neurosurgery, hand  surgery, rehabilitation doctor and ophthalmology. She was on the trauma service. She was advised to follow-up as an outpatient with neurosurgery, ophthalmology, ENT, hand surgery.    She was in rehabilitation inpatient from 04/20/2016 through 05/01/2016.   She had a carotid Doppler ultrasound on 10/13/2016 which showed less than 50% stenosis of bilateral ICAs.    She had a follow-up head CT on 04/17/2016 which I reviewed: IMPRESSION: Stable small volume  subarachnoid hemorrhage in the parafalcine right frontal lobe and left quadrigeminal plate cistern.   No new hemorrhage or acute abnormality.  No hydrocephalus.   I first met her on 06/06/2014, at which time she reported a long-standing history of balance issues and memory issues. Symptoms were progressive. She had fallen in the past and had physical therapy when she had had her knee replacement surgery. She had missed some recent B12 injections per husband's report.    06/06/2014: She reports a longstanding history of balance issues and memory issues. She feels her short-term memory has suffered. She feels her condition has progressed. She does report having fallen in the past. She had physical therapy in the past when she had her knee replacement surgery, but not more recently. Her husband notes that she has missed a couple of B12 injection appointments as I understand.   She had a CT head with without contrast on 08/19/2012: No acute intracranial abnormality. Stable mild atrophy and chronic microvascular change.   She had her lumbar spine MRI without contrast on 07/24/2009: 1. Little interval change from prior exam with slight progression of left foraminal stenosis at L3-L4 compared to the prior exam of 2009. 2. Laminectomy and posterior decompression extending from L3-L4 through L5-S1. No recurrent central or lateral recess stenosis. Foraminal stenosis as described above. 3. In this patient with right radicular symptoms, question right L4 radiculitis associated with compression in the right L4-L5 neural foramen. She had a brain MRI with and without contrast and a brain MRA without contrast on 03/16/2002 ordered by Dr. Erling Cruz with the indication of confusion, dizziness, syncope and fainting: These were reported as normal.   She had previously seen Dr. Erling Cruz in our office, last in 2009. I reviewed the office note from 07/26/2008, at which time she presented for balance problems and falls. In July and  August 2008 she underwent neuropsychological evaluation for complaints of memory problems and test results were in keeping with normal intellectual functioning. Dr. Erling Cruz started her on aspirin. An MMSE score was 30 out of 30 at the time. She had seen Dr. Erling Cruz back in June 2008 for balance problems and falls. Her MMSE was 30 out of 30 at the time, clock drawing was 4 out of 4. He did not notice any significant gait disorder at the time and suspected that in view of her history of gastric bypass, peripheral neuropathy should be suspected and he ordered an EMG and nerve conduction test as well as EEG. He check TSH, RPR and vitamin B12 level as well as methylmalonic acid. She had an EMG and nerve conduction test on 02/10/2007 which I reviewed. This was done by Dr. 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 She had an EEG on 02/10/2007 which was interpreted as normal EEG during the awake and drowsy states. MRI of the cervical spine from 03/17/2007 showed prominent spondylolytic changes throughout most noticeable at C5-6 and C6-7 with mild canal stenosis at C5-6  with no significant foraminal stenosis, root encroachment or cord signal abnormality.    Her Past Medical History Is Significant For: Past Medical History:  Diagnosis Date  . Acne rosacea   . Anemia    s/p gastric bypass, malabsorption  . Arthritis   . B12 deficiency   . Bipolar II disorder ([place]) 04/28/2018  . Chronic fatigue fibromyalgia syndrome   . DDD (degenerative disc disease), cervical 06/29/2017  . DDD (degenerative disc disease), lumbar 06/29/2017   s/p laminectomy   . Dysphagia    due to esophageal narrowing, able to take meds with thin liquid sif necessary   . Fibromyalgia   . Frequent falls    last fall was 03-09-2019 , my knee gave out. other times i feel like its vertigo . like a spacey feeling " .  denies loc or loss of time , did not hit head   . GAD (generalized anxiety disorder) 07/10/2018  . GERD (gastroesophageal reflux disease)   . Graves disease   . H/O hiatal hernia   . HTN (hypertension)    was briefly on losartan back early 2020 , had a severe reaction and was hospitalized , losartan was dc'd and she report shas not been prescribed any new bp meds as of yet, denies lasting issues from hospitalization   . Hypothyroidism   . IBS (irritable bowel syndrome)    resolved with use of oxymorhpne   . Lymphedema   . Major depressive disorder   . Meralgia paraesthetica   . Sjogren's disease ([place]) 06/29/2017  . Status post dilation of esophageal narrowing   . Traumatic subarachnoid bleed with LOC of 1 hour to 5 hours 59 minutes ([place]) 04/16/2016    Her Past Surgical History Is Significant For: Past Surgical History:  Procedure Laterality Date  . ABDOMINAL HYSTERECTOMY    . APPENDECTOMY    . AUGMENTATION MAMMAPLASTY Bilateral    in her 40's pt had a reaction to her implants, she had them removed  . BALLOON DILATION  03/30/2012   Procedure: BALLOON DILATION;  Surgeon: Inda Castle, MD;  Location: [facility];  Service: Endoscopy;  Laterality: N/A;  . BOTOX INJECTION N/A 12/26/2014   Procedure: BOTOX INJECTION;  Surgeon: Inda Castle, MD;  Location: [facility];  Service: Endoscopy;  Laterality: N/A;  . ENDOMETRIAL ABLATION     x 4  . ESOPHAGEAL MANOMETRY N/A 05/15/2013   Procedure: ESOPHAGEAL MANOMETRY (EM);  Surgeon: Inda Castle, MD;  Location: [facility];  Service: Endoscopy;  Laterality: N/A;  . ESOPHAGOGASTRODUODENOSCOPY  03/30/2012   Procedure: ESOPHAGOGASTRODUODENOSCOPY (EGD);  Surgeon: Inda Castle, MD;  Location: [facility];  Service: Endoscopy;  Laterality: N/A;  . ESOPHAGOGASTRODUODENOSCOPY  05/04/2012   Procedure: ESOPHAGOGASTRODUODENOSCOPY (EGD);  Surgeon: Inda Castle, MD;  Location: [facility];  Service: Endoscopy;  Laterality: N/A;  .  ESOPHAGOGASTRODUODENOSCOPY N/A 12/26/2014   Procedure: ESOPHAGOGASTRODUODENOSCOPY (EGD);  Surgeon: Inda Castle, MD;  Location: [facility];  Service: Endoscopy;  Laterality: N/A;  botox injection  . GASTRIC BYPASS    . JOINT REPLACEMENT     right knee   . KNEE ARTHROPLASTY    . LAMINECTOMY     L3-L5  . OPEN REDUCTION INTERNAL FIXATION (ORIF) DISTAL RADIAL FRACTURE Right 04/23/2016   Procedure: OPEN REDUCTION INTERNAL FIXATION (ORIF)  RIGHT DISTAL RADIAL FRACTURE;  Surgeon: Leanora Cover, MD;  Location: [facility];  Service: Orthopedics;  Laterality: Right;  . ORIF TRIPOD FRACTURE Right 04/19/2016   Procedure: OPEN REDUCTION INTERNAL FIXATION (ORIF)  TRIPOD FRACTURE LIEBINGER MIDFACE NO DRILL;  Surgeon: Leta Baptist, MD;  Location: [facility];  Service: ENT;  Laterality: Right;  . 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 THYROIDECTOMY    . TOTAL KNEE ARTHROPLASTY Left 03/13/2019   Procedure: TOTAL KNEE ARTHROPLASTY;  Surgeon: Vickey Huger, MD;  Location: [facility];  Service: Orthopedics;  Laterality: Left;    Her Family History Is Significant For: Family History  Problem Relation Age of Onset  . Prostate cancer Maternal Grandfather   . Lymphoma Maternal Uncle   . Heart disease Father   . Heart disease Sister   . Irritable bowel syndrome Sister   . Dementia Mother   . Stroke Maternal Grandmother   . Appendicitis Paternal Grandmother   . Colon cancer Neg Hx     Her Social History Is Significant For: Social History   Socioeconomic History  . Marital status: Married    Spouse name: Not on file  . Number of children: 0  . Years of education: 46  . Highest education level: Master's degree (e.g., MA, MS, MEng, MEd, MSW, MBA)  Occupational History  . Occupation: housewife  Tobacco Use  . Smoking status: Never Smoker  . Smokeless tobacco: Never Used  Substance and Sexual Activity  . Alcohol use: No    Alcohol/week: 0.0 standard drinks  . Drug use: No  . Sexual activity: Not on file  Other  Topics Concern  . Not on file  Social History Narrative   Right handed, Married, Step kids 2, Caffeine 4-5 cups daily, graduate school   Rare caffeine use    Social Determinants of Health   Financial Resource Strain: Low Risk   . Difficulty of Paying Living Expenses: Not hard at all  Food Insecurity: No Food Insecurity  . Worried About Charity fundraiser in the Last Year: Never true  . Ran Out of Food in the Last Year: Never true  Transportation Needs: No Transportation Needs  . Lack of Transportation (Medical): No  . Lack of Transportation (Non-Medical): No  Physical Activity: Inactive  . Days of Exercise per Week: 0 days  . Minutes of Exercise per Session: 0 min  Stress: No Stress Concern Present  . Feeling of Stress : Only a little  Social Connections: Not Isolated  . Frequency of Communication with Friends and Family: More than three times a week  . Frequency of Social Gatherings with Friends and Family: Once a week  . Attends Religious Services: More than 4 times per year  . Active Member of Clubs or Organizations: Yes  . Attends Archivist Meetings: More than 4 times per year  . Marital Status: Married    Her Allergies Are:  Allergies  Allergen Reactions  . Losartan Rash  . Actonel [Risedronate Sodium]     GI Upset  . Ambien [Zolpidem Tartrate] Other (See Comments)    Sleep-walking, hallucinations  . Depakote [Divalproex Sodium]     Hallucinations  . Inderal [Propranolol] Other (See Comments)    Hallucinations   . Nsaids Other (See Comments)    Pt. Had gastric bypass surgery   . Penicillins Other (See Comments)    GI upset. Did it involve swelling of the face/tongue/throat, SOB, or low BP? No Did it involve sudden or severe rash/hives, skin peeling, or any reaction on the inside of your mouth or nose? No Did you need to seek medical attention at a hospital or doctor's office? No When did it last happen?As  a child If all above answers are "NO",  may proceed with cephalosporin use.    . Silicon Nausea And Vomiting    Thyroid storm  . Adhesive [Tape] Itching and Rash    Please use "paper" tape  . Latex Swelling and Rash    Redness lips  . Nickel Itching and Rash    Contact Dermatitis (intolerance)  :   Her Current Medications Are:  Outpatient Encounter Medications as of 09/06/2019  Medication Sig  . ALPRAZolam (XANAX) 0.5 MG tablet Take 0.5 mg by mouth 2 (two) times daily as needed for anxiety.  Marland Kitchen amLODipine (NORVASC) 2.5 MG tablet Take 2.5 mg by mouth daily.  . diphenhydrAMINE (BENADRYL) 25 MG tablet Take 50 mg by mouth every 6 (six) hours as needed for allergies.  . DULoxetine (CYMBALTA) 60 MG capsule TAKE 2 CAPSULES (120 MG TOTAL) BY MOUTH EVERY EVENING.  . hydrocortisone valerate ointment (WEST-CORT) 0.2 % Apply 1 application topically 2 (two) times daily as needed (skin irritation/rash (contact dermatitis)).   Marland Kitchen lamoTRIgine (LAMICTAL) 100 MG tablet TAKE 3 TABLETS (300 MG TOTAL) BY MOUTH AT BEDTIME.  Marland Kitchen levothyroxine (SYNTHROID) 125 MCG tablet Take 125 mcg by mouth daily before breakfast.  . LORazepam (ATIVAN) 1 MG tablet Take 1 mg at bedtime by mouth.  . meclizine (ANTIVERT) 12.5 MG tablet Take 12.5 mg by mouth 3 (three) times daily as needed for dizziness.  . Multiple Vitamin (MULTIVITAMIN WITH MINERALS) TABS tablet Take 1 tablet by mouth daily.  Marland Kitchen oxymorphone (OPANA) 5 MG tablet Take 5 mg by mouth.  . Phenylephrine HCl (SINEX ULTRA FINE MIST REGULAR NA) Place 1 spray into the nose daily as needed (congestion).  . Probiotic Product (PROBIOTIC DAILY PO) Take 1 capsule by mouth daily.  Marland Kitchen rOPINIRole (REQUIP) 1 MG tablet Take 1 mg by mouth at bedtime as needed (restless leg syndrome).   . simethicone (MYLICON) 80 MG chewable tablet Chew 80 mg by mouth every 6 (six) hours as needed for flatulence.  Marland Kitchen tiZANidine (ZANAFLEX) 4 MG tablet Take 1 tablet (4 mg total) by mouth every 6 (six) hours as needed.   No facility-administered  encounter medications on file as of 09/06/2019.  :  Review of Systems:  Out of a complete 14 point review of systems, all are reviewed and negative with the exception of these symptoms as listed below:  Review of Systems  Neurological:       Sts memory has declined some since last visit 3 months ago.     Objective:  Neurological Exam  Physical Exam Physical Examination:   Vitals:   09/06/19 1513  BP: (!) 146/78  Pulse: 76  Temp: (!) 97.5 F (36.4 C)    General Examination: The patient is a very pleasant 74 y.o. female in no acute distress. She appears well-developed and well-nourished and well groomed.   HEENT:Normocephalic, atraumatic, pupils are equal, round and reactive to light, extraocular tracking is good without limitation to gaze excursion or nystagmus noted. Normal smooth pursuit is noted. Hearing is grossly intact. Face is symmetric with normal facial animation and normal facial sensation. Speech is clear with no dysarthria noted. There is no hypophonia. There is no lip, neck/head, jaw or voice tremor. Neck is supple with full range of passive and active motion. There are no carotid bruits on auscultation. Oropharynx exam reveals: moderate to severe mouth dryness, adequate dental hygiene and no significant airway tightness noted. Mallampati is class II.  Chest:Clear to auscultation without wheezing, rhonchi or crackles  noted.  Heart:S1+S2+0, regular and normal without murmurs, rubs or gallops noted.   Abdomen:Soft, non-tender and non-distended with normal bowel sounds appreciated on auscultation.  Extremities:There is nonpitting puffiness of her distal legs bilaterally.   Skin: Warm and dry without trophic changes noted. There are no varicose veins.  Musculoskeletal: exam reveals no obvious joint deformities, tenderness or joint swelling or erythema.   Neurologically:  Mental status: The patient is awake, alert and oriented in all 4 spheres. Her  immediate and remote memory, attention, language skills and fund of knowledge are mildly impaired.   On 06/06/14: Her MMSE is 28/30.   On 03/09/2016: MMSE: 29/30, CDT: 4/4, AFT: 13/min.   On 02/16/2019: MMSE: 27/30, CDT: 4/4, AFT: 14/min.  On 09/06/2019: MMSE: 27/30, CDT: 4/4, AFT: 12/min.  There is very mild slowness in thinking noted. Speech shows no hypophonia with normal prosody and enunciation. Thought process is linear. Mood is normal, affect normal, better compared to previous visit.  Cranial nerves II - XII are as described above under HEENT exam. In addition: shoulder shrug is normal with equal shoulder height noted. Motor exam: Normal bulk, strength and tone is noted. There is no drift, No postural or action tremor.  Romberg is not tested for safety reasons. Fine motor skills and coordination: are grossly intact.  Cerebellar testing: No dysmetria or intention tremor on finger. There is no truncal or gait ataxia.  Sensory exam: intact to light touch.  Gait, station and balance: She stands with difficulty and pushes herself up. No veering to one side is noted. No leaning to one side is noted. Posture is age-appropriate and stance is slightly wide-based. She walks slowly and cautiously.   Assessment and Plan:   In summary, Jillian Morris is a 74 year old female with an underlying complex medical history of IBS (irritable bowel syndrome), hypothyroidism, chronic fatigue, FMS, meralgia paraesthetica, chronic low back pain, followed by pain management, on narcotic pain medications, anemia and B12 deficiency, secondary to gastric bypass surgery with malabsorption history, eczema, rosacea, anxiety, arthritis, depression, dysphagia, secondary to esophageal stricture (status post dilation of esophageal narrowing), GERD, stroke, Sjogren's syndrome, Graves disease and lymphedema, who presents for follow-up consultation of her memory loss and balance problem and mobility issues. MMSE scores  have been fairly stable, she had a hospitalization in April for encephalopathy last year, and we subsequently proceeded with memory evaluation with the help of neuropsychology.  Findings and test results from her neuropsychological evaluation are supportive of mild cognitive impairment. She had previously seen Dr. Erling Cruz in the past, had an MRI brain in 2003 with unremarkable findings, had memory testing with formal cognitive testing in 2008 with normal results at the time, and previous extensive blood work as well, and EMG and nerve conduction testing.  She is on multiple psychotropic medications and chronic narcotic pain medication.  She is advised to follow-up with her primary care physician regarding management of her mood disorder and the possibility of seeing a psychiatrist and psychologist.  She has significant difficulty sleeping at night.  She also takes lorazepam to help her sleep. We previously did a brain MRI and EEG in 2017. 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  The atrophy was notablyProgressed compared to 2017.  She had microvascular ischemic changes with mild progression noted compared to 2017. She is advised that we could proceed with sleep study evaluation, she is advised that  if she has underlying obstructive sleep apnea, she may benefit from treatment with a CPAP machine.  She is going to consider a sleep study.  As far as her memory, I suggested we continue to monitor.  She is advised to routinely follow-up in 6 months, sooner if needed.  I answered all their questions today and the patient and her husband were in agreement.  I spent 35 minutes in total face-to-face time and in reviewing records during pre-charting, more than 50% of which was spent in counseling and coordination of care, reviewing test results, reviewing medication and discussing or reviewing the diagnosis of  memory loss, balance problem, gait d/o, the prognosis and treatment options. Pertinent laboratory and imaging test results that were available during this visit with the patient were reviewed by me and considered in my medical decision making (see chart for details).

## 2019-09-06 NOTE — Patient Instructions (Signed)
Your exam, memory scores and neuropsychological evaluation suggest stable findings which is reassuring. Please talk to Dr. Tiburcio Pea about seeing a psychiatrist and a psychologist if possible.  I would like to be able to do a sleep study if possible, please call us anytime when you are ready to proceed with a sleep study, we can at least help rule out obstructive sleep apnea with a sleep study.  We can arrange for a daytime sleep study if you are more like during the day as opposed to at night. For now, we will continue to monitor your memory and follow-up in 6 months.

## 2019-10-26 ENCOUNTER — Ambulatory Visit: Payer: Medicare Other | Attending: Internal Medicine

## 2019-10-26 DIAGNOSIS — Z23 Encounter for immunization: Secondary | ICD-10-CM

## 2019-10-26 NOTE — Progress Notes (Signed)
   Covid-19 Vaccination Clinic  Name:  Jillian Morris    MRN: [id number] DOB: [birth year]  10/26/2019  [name] was observed post Covid-19 immunization for 30 minutes based on pre-vaccination screening without incident. She was provided with Vaccine Information Sheet and instruction to access the V-Safe system.   [name] was instructed to call 911 with any severe reactions post vaccine: Marland Kitchen Difficulty breathing  . Swelling of face and throat  . A fast heartbeat  . A bad rash all over body  . Dizziness and weakness

## 2019-11-21 ENCOUNTER — Ambulatory Visit: Payer: Medicare Other | Attending: Internal Medicine

## 2019-11-21 DIAGNOSIS — Z23 Encounter for immunization: Secondary | ICD-10-CM

## 2019-11-21 NOTE — Progress Notes (Signed)
   Covid-19 Vaccination Clinic  Name:  BRIGETT ESTELL    MRN: [id number] DOB: Aug 29, 1945  11/21/2019  Ms. Penagos was observed post Covid-19 immunization for 15 minutes without incident. She was provided with Vaccine Information Sheet and instruction to access the V-Safe system.   Ms. Osoria was instructed to call 911 with any severe reactions post vaccine: Marland Kitchen Difficulty breathing  . Swelling of face and throat  . A fast heartbeat  . A bad rash all over body  . Dizziness and weakness   Immunizations Administered    Name Date Dose VIS Date Route   Pfizer COVID-19 Vaccine 11/21/2019  4:10 PM 0.3 mL 08/04/2019 Intramuscular   Manufacturer: [employer], [employer]   Lot: [id number]   NDC: [address]-2

## 2019-12-07 ENCOUNTER — Other Ambulatory Visit: Payer: Self-pay | Admitting: Psychiatry

## 2019-12-08 NOTE — Telephone Encounter (Signed)
Pt at least needs evisit appt to keep getting refill.  Not seen since 2019

## 2019-12-08 NOTE — Telephone Encounter (Signed)
Talked with Steward Drone.  She has transfer her care to another dr.  She said if we got a refill request it was a mistake.  It should not have been sent here.  Please ignore refill requests sent for her.  Did thank everyone for help and care.

## 2019-12-08 NOTE — Telephone Encounter (Signed)
Last visit 05/2018

## 2019-12-17 ENCOUNTER — Other Ambulatory Visit: Payer: Self-pay | Admitting: Psychiatry

## 2019-12-18 NOTE — Telephone Encounter (Signed)
Last apt 05/2018

## 2019-12-18 NOTE — Telephone Encounter (Signed)
FYI.  Pt says she has another provider now.  We're getting refill requests by mistake.

## 2020-02-25 ENCOUNTER — Ambulatory Visit: Admit: 2020-02-25 | Payer: Medicare Other | Admitting: Orthopedic Surgery

## 2020-02-25 SURGERY — ARTHROPLASTY, KNEE, TOTAL
Anesthesia: Spinal | Laterality: Left

## 2020-03-05 ENCOUNTER — Ambulatory Visit: Payer: Medicare Other | Admitting: Neurology

## 2020-03-05 ENCOUNTER — Encounter: Payer: Self-pay | Admitting: Neurology

## 2020-03-05 VITALS — BP 128/70 | HR 71 | Ht 64.0 in | Wt 180.0 lb

## 2020-03-05 DIAGNOSIS — R413 Other amnesia: Secondary | ICD-10-CM | POA: Diagnosis not present

## 2020-03-05 DIAGNOSIS — R419 Unspecified symptoms and signs involving cognitive functions and awareness: Secondary | ICD-10-CM | POA: Diagnosis not present

## 2020-03-05 NOTE — Patient Instructions (Signed)
It was good to see you again today.  I am glad to hear that you are feeling stable, perhaps even a little better with your memory function.  Please continue with your healthy lifestyle and stay active mentally and physically as best as possible.  Try to rest enough, try to hydrate well with water, 6 to 8 cups of water a recommended, generally speaking, 8 ounce each.  Your memory scores look good today.  We can continue to monitor.  As discussed, we can see you back in about a year from now, sooner if we need to.

## 2020-03-05 NOTE — Progress Notes (Signed)
Subjective:    Patient ID: Jillian Morris is a 74 y.o. female.  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  The patient is accompanied by Jillian Morris.  I last saw Jillian on 09/06/2019, at which time we talked about Jillian neuropsychological evaluation and recommendations.  Jillian MMSE was 27 at the time, she was encouraged to consider sleep study to rule out obstructive sleep apnea.  She was advised to follow-up for a recheck in 6 months and we will continue to monitor Jillian symptoms.   Morris, 03/05/2020: She reports feeling stable, in fact, she feels that Jillian memory function is a little better.  She has started taking over-the-counter vitamin D and iron supplements.  She can tolerate the iron, she reports that she has always been anemic but could not tolerate iron in the past.  Sometimes she has loose stool from it but generally speaking tolerates it well.  She tries to stay active mentally, likes to do word puzzles and watches jeopardy.  She tries to eat well, they like to eat fresh fruit and vegetables.  She tries to hydrate well with water.  She has had chronic issues with sleep, as long as she can remember.  Previously:   I saw Jillian on 05/30/2019, at which time Jillian memory scores were fairly stable.  She felt stable as well.  She had left total knee replacement in July 2020.  She was in physical therapy.  I  suggested we proceed with neuropsychological evaluation.  I made a referral.  She was seen in the interim by Jillian Morris on 08/03/2019 with testing and a follow-up appointment on 08/11/2019.  I reviewed the test results and recommendations:   << Clinical Impression(s): Jillian Morris pattern of performance is suggestive of primary impairments surrounding attention/concentration and semantic fluency. Additional variability was noted across processing speed and cognitive flexibility. Performance across other aspects of executive functioning, receptive language, confrontation naming, phonemic fluency, visuospatial abilities, and learning and memory were within normal limits. 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 However, the mild severity of this condition should be emphasized currently.   Regarding etiology, Jillian Morris reported severe levels of acute anxiety and depression, as well as moderate ongoing sleep disturbances. It is very likely that psychiatric distress and sleep dysfunction are directly related to ongoing weaknesses in attention/concentration and cognitive flexibility. Should these symptoms become optimized, improvement may be possible. Specific to memory, Jillian Morris was able to learn novel verbal and visual information efficiently and retain this knowledge after lengthy delays. Overall, Jillian cognitive and behavioral profile is not suggestive of Alzheimer's disease or any other neurodegenerative illness presently. However, it is important to note that neuroimaging suggesting progressive medial temporal lobe atrophy does increase Jillian relative risk for developing Alzheimer's disease in the future. As such, continued monitoring will be  important moving forward.   Recommendations: A  repeat neuropsychological evaluation in 18-24 months (or sooner if functional decline is noted) is recommended to assess the trajectory of future cognitive decline should it occur. This will also aid in future efforts towards improved diagnostic clarity. >>    Of note, she missed an appointment on 05/18/2019. I saw Jillian on 02/15/2019 after a long gap of almost 3 years.  She had been hospitalized in April 2020 secondary to rash and fever.  She was noted to have altered mental status and acute kidney injury.  She was advised to discontinue Xanax.  Upon discharge Jillian creatinine had improved but was still mildly elevated.  Jillian MMSE was 27 out of 30 at the time.  I suggested we proceed with a brain MRI and neuropsychological evaluation.  She felt that Jillian memory had declined over the past year.  She also felt that Jillian mobility and balance had become worse.    She had a brain MRI without contrast on 04/01/2019 and I reviewed the results: IMPRESSION: This MRI of the brain without contrast shows the following: 1.   Moderate generalized cortical atrophy that is most pronounced in the mesial temporal lobes.  The atrophy has progressed compared to the 2017 MRI. 2.   T2/flair hyperintense foci in the pons and hemispheres in a pattern most consistent with chronic microvascular ischemic change.  None of the foci appear to be acute though there is mild progression compared to the 2017 MRI. 3.   No acute findings.   Jillian Morris called Jillian with Jillian test results in my absence.   She has not had neuropsychological evaluation.   She canceled an interim appointment in January 2018 and no showed for an appointment on 11/17/2016 as well as 02/04/2017. She is re-referred by Jillian primary care physician, Jillian Morris. I saw Jillian on 03/09/2016, at which time she reported more problems with Jillian memory, confusion while driving and several bouts of vertigo in the interim. She was re-referred by Jillian PCP at the time.  She was having  trouble with Jillian thyroid medication and thyroid function. She was getting B12 injections on a monthly basis. She was seeing Jillian Morris for pain management. She had some falls but thankfully without injuries reported. I had a long chat with Jillian at the time regarding multifactorial gait disorder, fall risk and memory dysfunction. Jillian MMSE was: 29/30, CDT: 4/4, AFT: 13/min.    I suggested a brain MRI and EEG. Jillian EEG from 03/12/2016 showed: Impression: This is an essentially normal EEG recording in the waking state. Movement artifact was prominent during the recording. No evidence of ictal or interictal discharges are seen.   We called Jillian with Jillian test results.    She had a brain MRI without contrast on 03/22/2016 which showed: IMPRESSION:  This MRI of the brain without contrast shows the following: 1.   T2/FLAIR hyperintense foci in the pons and the hemispheres most consistent with chronic microvascular ischemic change. The extent has increased since the MRI from 2009. 2.    Moderate cortical atrophy most pronounced in the mesial temporal lobes. The extent of atrophy has increased when compared to the MRI dated 08/07/2008. 3.    There are no acute findings.   We called Jillian with Jillian test results.   Of note, she fell in the interim. She was hospitalized in August 2017 after a fall with significant injuries. She was in the hospital from 04/16/2016 through 04/20/2016 and then  transferred to rehabilitation. She fell at a doctor's office and sustained injuries including right distal radius fracture and traumatic subarachnoid hemorrhage, she had facial injuries including facial Fx, and was seen by ENT, neurosurgery, hand surgery, rehabilitation doctor and ophthalmology. She was on the trauma service. She was advised to follow-up as an outpatient with neurosurgery, ophthalmology, ENT, hand surgery.    She was in rehabilitation inpatient from 04/20/2016 through 05/01/2016.   She had a carotid Doppler  ultrasound on 10/13/2016 which showed less than 50% stenosis of bilateral ICAs.    She had a follow-up head CT on 04/17/2016 which I reviewed: IMPRESSION: Stable small volume subarachnoid hemorrhage in the parafalcine right frontal lobe and left quadrigeminal plate cistern.   No new hemorrhage or acute abnormality.  No hydrocephalus.   I first met Jillian on 06/06/2014, at which time she reported a long-standing history of balance issues and memory issues. Symptoms were progressive. She had fallen in the past and had physical therapy when she had had Jillian knee replacement surgery. She had missed some recent B12 injections per husband's report.    06/06/2014: She reports a longstanding history of balance issues and memory issues. She feels Jillian short-term memory has suffered. She feels Jillian condition has progressed. She does report having fallen in the past. She had physical therapy in the past when she had Jillian knee replacement surgery, but not more recently. Jillian husband notes that she has missed a couple of B12 injection appointments as I understand.   She had a CT head with without contrast on 08/19/2012: No acute intracranial abnormality. Stable mild atrophy and chronic microvascular change.   She had Jillian lumbar spine MRI without contrast on 07/24/2009: 1. Little interval change from prior exam with slight progression of left foraminal stenosis at L3-L4 compared to the prior exam of 2009. 2. Laminectomy and posterior decompression extending from L3-L4 through L5-S1. No recurrent central or lateral recess stenosis. Foraminal stenosis as described above. 3. In this patient with right radicular symptoms, question right L4 radiculitis associated with compression in the right L4-L5 neural foramen. She had a brain MRI with and without contrast and a brain MRA without contrast on 03/16/2002 ordered by Dr. Erling Cruz with the indication of confusion, dizziness, syncope and fainting: These were reported as normal.   She  had previously seen Dr. Erling Cruz in our office, last in 2009. I reviewed the office note from 07/26/2008, at which time she presented for balance problems and falls. In July and August 2008 she underwent neuropsychological evaluation for complaints of memory problems and test results were in keeping with normal intellectual functioning. Dr. Erling Cruz started Jillian on aspirin. An MMSE score was 30 out of 30 at the time. She had seen Dr. Erling Cruz back in June 2008 for balance problems and falls. Jillian MMSE was 30 out of 30 at the time, clock drawing was 4 out of 4. He did not notice any significant gait disorder at the time and suspected that in view of Jillian history of gastric bypass, peripheral neuropathy should be suspected and he ordered an EMG and nerve conduction test as well as EEG. He check TSH, RPR and vitamin B12 level as well as methylmalonic acid. She had an EMG and nerve conduction test on 02/10/2007 which I reviewed. This was done by Dr. 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 She had an EEG on 02/10/2007 which  was interpreted as normal EEG during the awake and drowsy states. MRI of the cervical spine from 03/17/2007 showed prominent spondylolytic changes throughout most noticeable at C5-6 and C6-7 with mild canal stenosis at C5-6 with no significant foraminal stenosis, root encroachment or cord signal abnormality.   Jillian Past Medical History Is Significant For: Past Medical History:  Diagnosis Date  . Acne rosacea   . Anemia    s/p gastric bypass, malabsorption  . Arthritis   . B12 deficiency   . Bipolar II disorder ([place]) 04/28/2018  . Chronic fatigue fibromyalgia syndrome   . DDD (degenerative disc disease), cervical 06/29/2017  . DDD (degenerative disc disease), lumbar 06/29/2017   s/p laminectomy   . Dysphagia    due to esophageal narrowing, able to take meds with thin  liquid sif necessary   . Fibromyalgia   . Frequent falls    last fall was 03-09-2019 , my knee gave out. other times i feel like its vertigo . like a spacey feeling " . denies loc or loss of time , did not hit head   . GAD (generalized anxiety disorder) 07/10/2018  . GERD (gastroesophageal reflux disease)   . Graves disease   . H/O hiatal hernia   . HTN (hypertension)    was briefly on losartan back early 2020 , had a severe reaction and was hospitalized , losartan was dc'd and she report shas not been prescribed any new bp meds as of yet, denies lasting issues from hospitalization   . Hypothyroidism   . IBS (irritable bowel syndrome)    resolved with use of oxymorhpne   . Lymphedema   . Major depressive disorder   . Meralgia paraesthetica   . Sjogren's disease ([place]) 06/29/2017  . Status post dilation of esophageal narrowing   . Traumatic subarachnoid bleed with LOC of 1 hour to 5 hours 59 minutes ([place]) 04/16/2016    Jillian Past Surgical History Is Significant For: Past Surgical History:  Procedure Laterality Date  . ABDOMINAL HYSTERECTOMY    . APPENDECTOMY    . AUGMENTATION MAMMAPLASTY Bilateral    in Jillian 40's pt had a reaction to Jillian implants, she had them removed  . BALLOON DILATION  03/30/2012   Procedure: BALLOON DILATION;  Surgeon: Inda Castle, MD;  Location: [facility];  Service: Endoscopy;  Laterality: N/A;  . BOTOX INJECTION N/A 12/26/2014   Procedure: BOTOX INJECTION;  Surgeon: Inda Castle, MD;  Location: [facility];  Service: Endoscopy;  Laterality: N/A;  . ENDOMETRIAL ABLATION     x 4  . ESOPHAGEAL MANOMETRY N/A 05/15/2013   Procedure: ESOPHAGEAL MANOMETRY (EM);  Surgeon: Inda Castle, MD;  Location: [facility];  Service: Endoscopy;  Laterality: N/A;  . ESOPHAGOGASTRODUODENOSCOPY  03/30/2012   Procedure: ESOPHAGOGASTRODUODENOSCOPY (EGD);  Surgeon: Inda Castle, MD;  Location: [facility];  Service: Endoscopy;  Laterality: N/A;  . ESOPHAGOGASTRODUODENOSCOPY   05/04/2012   Procedure: ESOPHAGOGASTRODUODENOSCOPY (EGD);  Surgeon: Inda Castle, MD;  Location: [facility];  Service: Endoscopy;  Laterality: N/A;  . ESOPHAGOGASTRODUODENOSCOPY N/A 12/26/2014   Procedure: ESOPHAGOGASTRODUODENOSCOPY (EGD);  Surgeon: Inda Castle, MD;  Location: [facility];  Service: Endoscopy;  Laterality: N/A;  botox injection  . GASTRIC BYPASS    . JOINT REPLACEMENT     right knee   . KNEE ARTHROPLASTY    . LAMINECTOMY     L3-L5  . OPEN REDUCTION INTERNAL FIXATION (ORIF) DISTAL RADIAL FRACTURE Right 04/23/2016   Procedure: OPEN REDUCTION INTERNAL FIXATION (ORIF)  RIGHT DISTAL RADIAL FRACTURE;  Surgeon: Leanora Cover, MD;  Location: [facility];  Service: Orthopedics;  Laterality: Right;  . ORIF TRIPOD FRACTURE Right 04/19/2016   Procedure: OPEN REDUCTION INTERNAL FIXATION (ORIF) TRIPOD FRACTURE LIEBINGER MIDFACE NO DRILL;  Surgeon: Leta Baptist, MD;  Location: [facility];  Service: ENT;  Laterality: Right;  . 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 THYROIDECTOMY    . TOTAL KNEE ARTHROPLASTY Left 03/13/2019   Procedure: TOTAL KNEE ARTHROPLASTY;  Surgeon: Vickey Huger, MD;  Location: [facility];  Service: Orthopedics;  Laterality: Left;    Jillian Family History Is Significant For: Family History  Problem Relation Age of Onset  . Prostate cancer Maternal Grandfather   . Lymphoma Maternal Uncle   . Heart disease Father   . Heart disease Sister   . Irritable bowel syndrome Sister   . Dementia Mother   . Stroke Maternal Grandmother   . Appendicitis Paternal Grandmother   . Colon cancer Neg Hx     Jillian Social History Is Significant For: Social History   Socioeconomic History  . Marital status: Married    Spouse name: Not on file  . Number of children: 0  . Years of education: 64  . Highest education level: Master's degree (e.g., MA, MS, MEng, MEd, MSW, MBA)  Occupational History  . Occupation: housewife  Tobacco Use  . Smoking status: Never Smoker  . Smokeless tobacco: Never  Used  Vaping Use  . Vaping Use: Never used  Substance and Sexual Activity  . Alcohol use: No    Alcohol/week: 0.0 standard drinks  . Drug use: No  . Sexual activity: Not on file  Other Topics Concern  . Not on file  Social History Narrative   Right handed, Married, Step kids 2, Caffeine 4-5 cups daily, graduate school   Rare caffeine use    Social Determinants of Health   Financial Resource Strain:   . Difficulty of Paying Living Expenses:   Food Insecurity:   . Worried About Charity fundraiser in the Last Year:   . Arboriculturist in the Last Year:   Transportation Needs:   . Film/video editor (Medical):   Marland Kitchen Lack of Transportation (Non-Medical):   Physical Activity:   . Days of Exercise per Week:   . Minutes of Exercise per Session:   Stress:   . Feeling of Stress :   Social Connections:   . Frequency of Communication with Friends and Family:   . Frequency of Social Gatherings with Friends and Family:   . Attends Religious Services:   . Active Member of Clubs or Organizations:   . Attends Archivist Meetings:   Marland Kitchen Marital Status:     Jillian Allergies Are:  Allergies  Allergen Reactions  . Losartan Rash  . Actonel [Risedronate Sodium]     GI Upset  . Ambien [Zolpidem Tartrate] Other (See Comments)    Sleep-walking, hallucinations  . Depakote [Divalproex Sodium]     Hallucinations  . Inderal [Propranolol] Other (See Comments)    Hallucinations   . Nsaids Other (See Comments)    Pt. Had gastric bypass surgery   . Penicillins Other (See Comments)    GI upset. Did it involve swelling of the face/tongue/throat, SOB, or low BP? No Did it involve sudden or severe rash/hives, skin peeling, or any reaction on the inside of your mouth or nose? No Did you need to seek medical attention at a hospital or doctor's  office? No When did it last happen?As a child If all above answers are "NO", may proceed with cephalosporin use.    . Silicon Nausea And  Vomiting    Thyroid storm  . Adhesive [Tape] Itching and Rash    Please use "paper" tape  . Latex Swelling and Rash    Redness lips  . Nickel Itching and Rash    Contact Dermatitis (intolerance)  :   Jillian Current Medications Are:  Outpatient Encounter Medications as of 03/05/2020  Medication Sig  . ALPRAZolam (XANAX) 0.5 MG tablet Take 0.5 mg by mouth 2 (two) times daily as needed for anxiety.  Marland Kitchen amLODipine (NORVASC) 2.5 MG tablet Take 2.5 mg by mouth daily.  . diphenhydrAMINE (BENADRYL) 25 MG tablet Take 50 mg by mouth every 6 (six) hours as needed for allergies.  . DULoxetine (CYMBALTA) 60 MG capsule TAKE 2 CAPSULES (120 MG TOTAL) BY MOUTH EVERY EVENING.  . hydrocortisone valerate ointment (WEST-CORT) 0.2 % Apply 1 application topically 2 (two) times daily as needed (skin irritation/rash (contact dermatitis)).   Marland Kitchen lamoTRIgine (LAMICTAL) 100 MG tablet TAKE 3 TABLETS (300 MG TOTAL) BY MOUTH AT BEDTIME. (Patient taking differently: Take 200 mg by mouth at bedtime. Taking 2 per day)  . levothyroxine (SYNTHROID) 125 MCG tablet Take 125 mcg by mouth daily before breakfast.  . LORazepam (ATIVAN) 1 MG tablet Take 1 mg at bedtime by mouth.  . meclizine (ANTIVERT) 12.5 MG tablet Take 12.5 mg by mouth 3 (three) times daily as needed for dizziness.  . Multiple Vitamin (MULTIVITAMIN WITH MINERALS) TABS tablet Take 1 tablet by mouth daily.  Marland Kitchen oxymorphone (OPANA) 5 MG tablet Take 5 mg by mouth.  . Phenylephrine HCl (SINEX ULTRA FINE MIST REGULAR NA) Place 1 spray into the nose daily as needed (congestion).  . Probiotic Product (PROBIOTIC DAILY PO) Take 1 capsule by mouth daily.  Marland Kitchen rOPINIRole (REQUIP) 1 MG tablet Take 1 mg by mouth at bedtime as needed (restless leg syndrome).   . simethicone (MYLICON) 80 MG chewable tablet Chew 80 mg by mouth every 6 (six) hours as needed for flatulence.  Marland Kitchen tiZANidine (ZANAFLEX) 4 MG tablet Take 1 tablet (4 mg total) by mouth every 6 (six) hours as needed.   No  facility-administered encounter medications on file as of 03/05/2020.  :  Review of Systems:  Out of a complete 14 point review of systems, all are reviewed and negative with the exception of these symptoms as listed below: Review of Systems  Neurological:       Here for f/u memory. Reports she has been stable. Denies and falls since last visit.     Objective:  Neurological Exam  Physical Exam Physical Examination:   Vitals:   03/05/20 1440  BP: 128/70  Pulse: 71  SpO2: 96%    General Examination: The patient is a very pleasant 74 y.o. female in no acute distress. She appears well-developed and well-nourished and well groomed.   HEENT:Normocephalic, atraumatic, pupils are equal, round and reactive to light, extraocular tracking is well preserved, no obvious nystagmus, hearing is perhaps mildly impaired, in fact, she has been evaluated for hearing loss but does not have any hearing aids.  Face is symmetric with normal facial animation.  Speech is clear, no dysarthria or hypophonia or voice tremor.  Neck is supple, unremarkable scar anterior neck. There are no carotid bruits on auscultation.   Chest:Clear to auscultation without wheezing, rhonchi or crackles noted.  Heart:S1+S2+0, regular and normal without murmurs,  rubs or gallops noted.   Abdomen:Soft, non-tender and non-distended.  Extremities:There is nonpitting puffiness of Jillian distal legs bilaterally, stable.   Skin: Warm and dry without trophic changes noted.   Musculoskeletal: exam reveals no obvious joint deformities, tenderness or joint swelling or erythema.   Neurologically:  Mental status: The patient is awake, alert and oriented in all 4 spheres. Jillian immediate and remote memory, attention, language skills and fund of knowledge aremildly impaired, she is able to provide Jillian own history well Morris.  On 06/06/14: Jillian MMSE is 28/30.   On 03/09/2016: MMSE: 29/30, CDT: 4/4, AFT:  13/min.  On6/25/2020: MMSE: 27/30, CDT: 4/4, AFT: 14/min.  On 09/06/2019: MMSE: 27/30, CDT: 4/4, AFT: 12/min.  On 03/05/2020: MMSE: 29/30, CDT: 4/4, AFT: 12/min.  There is no significant slowness in thinking Morris. Speech shows no hypophonia with normal prosody and enunciation. Thought process is linear. Mood is normal, affect normal, good spirits.   Cranial nerves II - XII are as described above under HEENT exam. Motor exam: Normal bulk, strength and tone is noted. There is no tremor. Romberg is not tested for safety reasons. Fine motor skills and coordination:are grosslyintact.  Cerebellar testing: No dysmetria or intention tremor on finger.Sensory exam: intact to light touch.  Assessment and Plan:   In summary, AMATULLAH CHRISTY is a 74 year old female with an underlying complex medical history of IBS (irritable bowel syndrome), hypothyroidism, chronic fatigue, FMS, meralgia paraesthetica, chronic low back pain, followed by pain management, on narcotic pain medications, anemia and B12 deficiency, secondary to gastric bypass surgery with malabsorption history, eczema, rosacea, anxiety, arthritis, depression, dysphagia, secondary to esophageal stricture (status post dilation of esophageal narrowing), GERD, stroke, Sjogren's syndrome, Graves disease and lymphedema, whopresents for follow-up consultation of Jillian memory loss. Jillian MMSE scores have been fairly stable. She was hospitalization in 2020 for encephalopathy. She had evaluation with neuropsychology. Findings and test results from Jillian neuropsychological evaluation are supportive of mild cognitive impairment. She had previously seen Dr. Erling Cruz in the past, had an MRI brain in 2003 with unremarkable findings, had memory testing with formal cognitive testing in 2008 with normal results at the time, and previous extensive blood work as well,andEMG and nerve conduction testing. She continues to take narcotic pain medication and is on multiple  psychotropic medications.  She has significant difficulty sleeping at night.  She also takes lorazepam to help Jillian sleep. Wepreviously did a brain MRI and EEGin 2017.She had a brain MRI on 04/01/2019 without contrast which showed moderate generalized cortical atrophy that is most pronounced in the mesial temporal lobes.  The atrophy was progressed compared to 2017.  She had microvascular ischemic changes with mild progression noted compared to 2017.  She was previously advised to proceed with a sleep study but declined it at the time.  At this juncture, we will continue to monitor Jillian memory.  She is advised to follow-up in 1 year, sooner if needed.  I answered all their questions Morris and the patient and Jillian husband were in agreement.  I spent 20 minutes in total face-to-face time and in reviewing records during pre-charting, more than 50% of which was spent in counseling and coordination of care, reviewing test results, reviewing medications and treatment regimen and/or in discussing or reviewing the diagnosis of memory loss, the prognosis and treatment options. Pertinent laboratory and imaging test results that were available during this visit with the patient were reviewed by me and considered in my medical decision making (see chart for  details).

## 2020-03-07 ENCOUNTER — Other Ambulatory Visit: Payer: Self-pay | Admitting: Psychiatry

## 2020-03-07 NOTE — Telephone Encounter (Signed)
Last visit 2019

## 2020-03-08 NOTE — Telephone Encounter (Signed)
I don't see the note but am sure she left a pre vious message that she was getting meds refilled elsewhere.  If this is wrong she needs to schedule an appt.

## 2020-09-13 ENCOUNTER — Other Ambulatory Visit: Payer: Self-pay | Admitting: Gastroenterology

## 2020-09-13 DIAGNOSIS — R1310 Dysphagia, unspecified: Secondary | ICD-10-CM

## 2020-09-18 ENCOUNTER — Other Ambulatory Visit: Payer: Self-pay | Admitting: Gastroenterology

## 2020-09-18 ENCOUNTER — Ambulatory Visit
Admission: RE | Admit: 2020-09-18 | Discharge: 2020-09-18 | Disposition: A | Discharge status: Home / Self Care | Payer: Medicare Other | Source: Ambulatory Visit | Attending: Gastroenterology | Admitting: Gastroenterology

## 2020-09-18 DIAGNOSIS — R413 Other amnesia: Secondary | ICD-10-CM | POA: Insufficient documentation

## 2020-09-18 DIAGNOSIS — R1310 Dysphagia, unspecified: Secondary | ICD-10-CM

## 2020-09-18 DIAGNOSIS — R32 Unspecified urinary incontinence: Secondary | ICD-10-CM | POA: Insufficient documentation

## 2020-09-18 DIAGNOSIS — R42 Dizziness and giddiness: Secondary | ICD-10-CM | POA: Insufficient documentation

## 2020-09-18 DIAGNOSIS — W19XXXA Unspecified fall, initial encounter: Secondary | ICD-10-CM | POA: Insufficient documentation

## 2020-09-24 ENCOUNTER — Other Ambulatory Visit: Payer: Self-pay | Admitting: Gastroenterology

## 2020-09-30 ENCOUNTER — Telehealth: Payer: Self-pay | Admitting: Neurology

## 2020-09-30 NOTE — Telephone Encounter (Signed)
I received a copy of her head CT report.  Patient had a head CT without contrast through [facility] on September 23, 2020.  Ordering provider was [place], [place].  Indication: Dizziness, memory loss.  Impression: No acute intracranial abnormality.  Stable chronic findings as detailed above.  FYI, nothing further needed at this time.

## 2020-10-22 DIAGNOSIS — L299 Pruritus, unspecified: Secondary | ICD-10-CM | POA: Insufficient documentation

## 2020-11-07 ENCOUNTER — Other Ambulatory Visit: Payer: Self-pay | Admitting: Gastroenterology

## 2020-11-08 ENCOUNTER — Other Ambulatory Visit (HOSPITAL_COMMUNITY)
Admission: RE | Admit: 2020-11-08 | Discharge: 2020-11-08 | Disposition: A | Discharge status: Home / Self Care | Payer: Medicare Other | Source: Ambulatory Visit | Attending: Gastroenterology | Admitting: Gastroenterology

## 2020-11-08 DIAGNOSIS — Z01812 Encounter for preprocedural laboratory examination: Secondary | ICD-10-CM | POA: Diagnosis present

## 2020-11-08 DIAGNOSIS — Z20822 Contact with and (suspected) exposure to covid-19: Secondary | ICD-10-CM | POA: Insufficient documentation

## 2020-11-09 LAB — SARS CORONAVIRUS 2 (TAT 6-24 HRS): SARS Coronavirus 2: NEGATIVE

## 2020-11-12 ENCOUNTER — Ambulatory Visit (HOSPITAL_COMMUNITY): Payer: Medicare Other | Admitting: Certified Registered Nurse Anesthetist

## 2020-11-12 ENCOUNTER — Ambulatory Visit (HOSPITAL_COMMUNITY)
Admission: RE | Admit: 2020-11-12 | Discharge: 2020-11-12 | Disposition: A | Discharge status: Home / Self Care | Payer: Medicare Other | Attending: Gastroenterology | Admitting: Gastroenterology

## 2020-11-12 ENCOUNTER — Encounter (HOSPITAL_COMMUNITY): Payer: Self-pay | Admitting: Gastroenterology

## 2020-11-12 ENCOUNTER — Encounter (HOSPITAL_COMMUNITY)
Admission: RE | Disposition: A | Discharge status: Home / Self Care | Payer: Self-pay | Source: Home / Self Care | Attending: Gastroenterology

## 2020-11-12 ENCOUNTER — Other Ambulatory Visit: Payer: Self-pay

## 2020-11-12 DIAGNOSIS — Z881 Allergy status to other antibiotic agents status: Secondary | ICD-10-CM | POA: Diagnosis not present

## 2020-11-12 DIAGNOSIS — Z96653 Presence of artificial knee joint, bilateral: Secondary | ICD-10-CM | POA: Diagnosis not present

## 2020-11-12 DIAGNOSIS — K220 Achalasia of cardia: Secondary | ICD-10-CM | POA: Insufficient documentation

## 2020-11-12 DIAGNOSIS — Z79899 Other long term (current) drug therapy: Secondary | ICD-10-CM | POA: Insufficient documentation

## 2020-11-12 DIAGNOSIS — Z880 Allergy status to penicillin: Secondary | ICD-10-CM | POA: Insufficient documentation

## 2020-11-12 DIAGNOSIS — Z888 Allergy status to other drugs, medicaments and biological substances status: Secondary | ICD-10-CM | POA: Diagnosis not present

## 2020-11-12 DIAGNOSIS — K219 Gastro-esophageal reflux disease without esophagitis: Secondary | ICD-10-CM | POA: Insufficient documentation

## 2020-11-12 DIAGNOSIS — Z886 Allergy status to analgesic agent status: Secondary | ICD-10-CM | POA: Insufficient documentation

## 2020-11-12 DIAGNOSIS — Z91040 Latex allergy status: Secondary | ICD-10-CM | POA: Diagnosis not present

## 2020-11-12 DIAGNOSIS — Z79890 Hormone replacement therapy (postmenopausal): Secondary | ICD-10-CM | POA: Insufficient documentation

## 2020-11-12 DIAGNOSIS — R1310 Dysphagia, unspecified: Secondary | ICD-10-CM | POA: Diagnosis present

## 2020-11-12 DIAGNOSIS — Z980 Intestinal bypass and anastomosis status: Secondary | ICD-10-CM | POA: Diagnosis not present

## 2020-11-12 HISTORY — PX: BOTOX INJECTION: SHX5754

## 2020-11-12 HISTORY — PX: ESOPHAGOGASTRODUODENOSCOPY (EGD) WITH PROPOFOL: SHX5813

## 2020-11-12 SURGERY — ESOPHAGOGASTRODUODENOSCOPY (EGD) WITH PROPOFOL
Anesthesia: Monitor Anesthesia Care

## 2020-11-12 MED ORDER — PROPOFOL 500 MG/50ML IV EMUL
INTRAVENOUS | Status: DC | PRN
  Administered 2020-11-12: 100 ug/kg/min via INTRAVENOUS

## 2020-11-12 MED ORDER — LIDOCAINE 2% (20 MG/ML) 5 ML SYRINGE
INTRAMUSCULAR | Status: DC | PRN
  Administered 2020-11-12: 100 mg via INTRAVENOUS

## 2020-11-12 MED ORDER — SODIUM CHLORIDE (PF) 0.9 % IJ SOLN
INTRAMUSCULAR | Status: AC
  Filled 2020-11-12: qty 10

## 2020-11-12 MED ORDER — SODIUM CHLORIDE 0.9 % IV SOLN: INTRAVENOUS | Status: DC

## 2020-11-12 MED ORDER — LACTATED RINGERS IV SOLN: INTRAVENOUS | Status: DC

## 2020-11-12 MED ORDER — FENTANYL CITRATE (PF) 100 MCG/2ML IJ SOLN
INTRAMUSCULAR | Status: AC
  Filled 2020-11-12: qty 2

## 2020-11-12 MED ORDER — FENTANYL CITRATE (PF) 100 MCG/2ML IJ SOLN
INTRAMUSCULAR | Status: DC | PRN
  Administered 2020-11-12 (×2): 50 ug via INTRAVENOUS

## 2020-11-12 MED ORDER — PROPOFOL 500 MG/50ML IV EMUL
INTRAVENOUS | Status: AC
  Filled 2020-11-12: qty 50

## 2020-11-12 MED ORDER — PROPOFOL 10 MG/ML IV BOLUS
INTRAVENOUS | Status: DC | PRN
  Administered 2020-11-12: 30 mg via INTRAVENOUS

## 2020-11-12 MED ORDER — ONABOTULINUMTOXINA 100 UNITS IJ SOLR
INTRAMUSCULAR | Status: AC
  Filled 2020-11-12: qty 100

## 2020-11-12 SURGICAL SUPPLY — 14 items

## 2020-11-12 NOTE — Discharge Instructions (Signed)
YOU HAD AN ENDOSCOPIC PROCEDURE TODAY: Refer to the procedure report and other information in the discharge instructions given to you for any specific questions about what was found during the examination. If this information does not answer your questions, please call [facility] office at [phone] to clarify.   YOU SHOULD EXPECT: Some feelings of bloating in the abdomen. Passage of more gas than usual. Walking can help get rid of the air that was put into your GI tract during the procedure and reduce the bloating.  DIET: Your first meal following the procedure should be a light meal and then it is ok to progress to your normal diet. A half-sandwich or bowl of soup is an example of a good first meal. Heavy or fried foods are harder to digest and may make you feel nauseous or bloated. Drink plenty of fluids but you should avoid alcoholic beverages for 24 hours.    ACTIVITY: Your care partner should take you home directly after the procedure. You should plan to take it easy, moving slowly for the rest of the day. You can resume normal activity the day after the procedure however YOU SHOULD NOT DRIVE, use power tools, machinery or perform tasks that involve climbing or major physical exertion for 24 hours (because of the sedation medicines used during the test).   SYMPTOMS TO REPORT IMMEDIATELY: A gastroenterologist can be reached at any hour. Please call [phone]  for any of the following symptoms:   . Following upper endoscopy (EGD, EUS, ERCP, esophageal dilation) Vomiting of blood or coffee ground material  New, significant abdominal pain  New, significant chest pain or pain under the shoulder blades  Painful or persistently difficult swallowing  New shortness of breath  Black, tarry-looking or red, bloody stools  FOLLOW UP:  If any biopsies were taken you will be contacted by phone or by letter within the next 1-3 weeks. Call [phone]  if you have not heard about the biopsies in 3  weeks.  Please also call with any specific questions about appointments or follow up tests. YOU HAD AN ENDOSCOPIC PROCEDURE TODAY: Refer to the procedure report and other information in the discharge instructions given to you for any specific questions about what was found during the examination. If this information does not answer your questions, please call [facility] office at [phone] to clarify.   YOU SHOULD EXPECT: Some feelings of bloating in the abdomen. Passage of more gas than usual. Walking can help get rid of the air that was put into your GI tract during the procedure and reduce the bloating.   DIET: Your first meal following the procedure should be a light meal and then it is ok to progress to your normal diet. A half-sandwich or bowl of soup is an example of a good first meal. Heavy or fried foods are harder to digest and may make you feel nauseous or bloated. Drink plenty of fluids but you should avoid alcoholic beverages for 24 hours. If you had a esophageal dilation, please see attached instructions for diet.    ACTIVITY: Your care partner should take you home directly after the procedure. You should plan to take it easy, moving slowly for the rest of the day. You can resume normal activity the day after the procedure however YOU SHOULD NOT DRIVE, use power tools, machinery or perform tasks that involve climbing or major physical exertion for 24 hours (because of the sedation medicines used during the test).   SYMPTOMS TO REPORT   IMMEDIATELY: A gastroenterologist can be reached at any hour. Please call [phone]  for any of the following symptoms:   . Following upper endoscopy (EGD, EUS, ERCP, esophageal dilation) Vomiting of blood or coffee ground material  New, significant abdominal pain  New, significant chest pain or pain under the shoulder blades  Painful or persistently difficult swallowing  New shortness of breath  Black, tarry-looking or red, bloody stools  FOLLOW  UP:  If any biopsies were taken you will be contacted by phone or by letter within the next 1-3 weeks. Call [phone]  if you have not heard about the biopsies in 3 weeks.  Please also call with any specific questions about appointments or follow up tests.  

## 2020-11-12 NOTE — Anesthesia Postprocedure Evaluation (Signed)
Anesthesia Post Note  Patient: JAZLIN TAPSCOTT  Procedure(s) Performed: ESOPHAGOGASTRODUODENOSCOPY (EGD) WITH PROPOFOL (N/A ) SAVORY DILATION vs balloon vs Botox injection (N/A ) BOTOX INJECTION     Patient location during evaluation: PACU Anesthesia Type: MAC Level of consciousness: awake and alert and oriented Pain management: pain level controlled Vital Signs Assessment: post-procedure vital signs reviewed and stable Respiratory status: spontaneous breathing, nonlabored ventilation and respiratory function stable Cardiovascular status: blood pressure returned to baseline Postop Assessment: no apparent nausea or vomiting Anesthetic complications: no   No complications documented.  Last Vitals:  Vitals:   11/12/20 1058 11/12/20 1110  BP: 129/71 (!) 143/75  Pulse: 66 64  Resp: 19 15  Temp:    SpO2: 100% 98%               Brennan Bailey

## 2020-11-12 NOTE — Op Note (Signed)
[facility] Patient Name: Jillian Morris Procedure Date: 11/12/2020 MRN: [id number] Attending MD: [name] , MD Date of Birth: October 21, 1945 CSN: [id number] Age: 75 Admit Type: Outpatient Procedure:                Upper GI endoscopy Indications:              Dysphagia, Esophageal reflux, Achalasia Providers:                [name], MD, Shelda Jakes, RN,                            Sunday Corn Mbumina, Technician Referring MD:             Johny Blamer Medicines:                Propofol per Anesthesia, Monitored Anesthesia Care Complications:            No immediate complications. Estimated Blood Loss:     Estimated blood loss was minimal. Procedure:                Pre-Anesthesia Assessment:                           - Prior to the procedure, a History and Physical                            was performed, and patient medications and                            allergies were reviewed. The patient's tolerance of                            previous anesthesia was also reviewed. The risks                            and benefits of the procedure and the sedation                            options and risks were discussed with the patient.                            All questions were answered, and informed consent                            was obtained. Prior Anticoagulants: The patient has                            taken no previous anticoagulant or antiplatelet                            agents. ASA Grade Assessment: III - A patient with                            severe systemic disease. After reviewing the risks  and benefits, the patient was deemed in                            satisfactory condition to undergo the procedure.                           After obtaining informed consent, the endoscope was                            passed under direct vision. Throughout the                            procedure, the  patient's blood pressure, pulse, and                            oxygen saturations were monitored continuously. The                            GIF-H190 ([id number] Olympus gastroscope was                            introduced through the mouth, and advanced to the                            second part of duodenum. The upper GI endoscopy was                            accomplished without difficulty. The patient                            tolerated the procedure well. Scope In: Scope Out: Findings:      The examined esophagus was normal. Area was successfully injected with 4       mL botulinum toxin for achalasia. Estimated blood loss was minimal.      The Z-line was regular and was found 40 cm from the incisors.      Evidence of a Roux-en-Y gastrojejunostomy was found. The gastrojejunal       anastomosis was characterized by healthy appearing mucosa. This was       traversed. The pouch-to-jejunum limb was characterized by healthy       appearing mucosa. The jejunojejunal anastomosis was characterized by       healthy appearing mucosa. Impression:               - Normal esophagus. Injected.                           - Z-line regular, 40 cm from the incisors.                           - Roux-en-Y gastrojejunostomy with gastrojejunal                            anastomosis characterized by healthy appearing  mucosa.                           - No specimens collected. Moderate Sedation:      Not Applicable - Patient had care per Anesthesia. Recommendation:           - Patient has a contact number available for                            emergencies. The signs and symptoms of potential                            delayed complications were discussed with the                            patient. Return to normal activities tomorrow.                            Written discharge instructions were provided to the                            patient.                            - Resume previous diet.                           - Return to my office in 2 months. Procedure Code(s):        --- Professional ---                           [phone], Esophagogastroduodenoscopy, flexible,                            transoral; diagnostic, including collection of                            specimen(s) by brushing or washing, when performed                            (separate procedure) Diagnosis Code(s):        --- Professional ---                           R13.10, Dysphagia, unspecified                           K21.9, Gastro-esophageal reflux disease without                            esophagitis                           K22.0, Achalasia of cardia                           Z98.0, Intestinal bypass and anastomosis status CPT copyright 2019 [employer]. All rights reserved. The  codes documented in this report are preliminary and upon coder review may  be revised to meet current compliance requirements. [name], MD 11/12/2020 10:57:04 AM This report has been signed electronically. Number of Addenda: 0

## 2020-11-12 NOTE — Anesthesia Preprocedure Evaluation (Addendum)
Anesthesia Evaluation  Patient identified by MRN, date of birth, ID band Patient awake    Reviewed: Allergy & Precautions, NPO status , Patient's Chart, lab work & pertinent test results  History of Anesthesia Complications Negative for: history of anesthetic complications  Airway Mallampati: II  TM Distance: >3 FB Neck ROM: Full    Dental no notable dental hx.    Pulmonary neg pulmonary ROS,    Pulmonary exam normal        Cardiovascular hypertension, Normal cardiovascular exam     Neuro/Psych Anxiety Depression Bipolar Disorder CVA    GI/Hepatic Neg liver ROS, hiatal hernia, GERD  ,  Endo/Other  Hypothyroidism   Renal/GU negative Renal ROS  negative genitourinary   Musculoskeletal  (+) Arthritis , Fibromyalgia -, narcotic dependent  Abdominal   Peds  Hematology   Anesthesia Other Findings Day of surgery medications reviewed with patient.  Reproductive/Obstetrics negative OB ROS                            Anesthesia Physical Anesthesia Plan  ASA: III  Anesthesia Plan: MAC   Post-op Pain Management:    Induction:   PONV Risk Score and Plan: Treatment may vary due to age or medical condition and Propofol infusion  Airway Management Planned: Natural Airway and Nasal Cannula  Additional Equipment: None  Intra-op Plan:   Post-operative Plan:   Informed Consent: I have reviewed the patients History and Physical, chart, labs and discussed the procedure including the risks, benefits and alternatives for the proposed anesthesia with the patient or authorized representative who has indicated his/her understanding and acceptance.       Plan Discussed with: CRNA  Anesthesia Plan Comments:         Anesthesia Quick Evaluation

## 2020-11-12 NOTE — Interval H&P Note (Signed)
History and Physical Interval Note:  11/12/2020 10:07 AM  Jillian Morris  has presented today for surgery, with the diagnosis of Dysphagia:esophageal stricture.  The various methods of treatment have been discussed with the patient and family. After consideration of risks, benefits and other options for treatment, the patient has consented to  Procedure(s): ESOPHAGOGASTRODUODENOSCOPY (EGD) WITH PROPOFOL (N/A) SAVORY DILATION vs balloon vs Botox injection (N/A) as a surgical intervention.  The patient's history has been reviewed, patient examined, no change in status, stable for surgery.  I have reviewed the patient's chart and labs.  Questions were answered to the patient's satisfaction.     [name]

## 2020-11-12 NOTE — Transfer of Care (Signed)
Immediate Anesthesia Transfer of Care Note  Patient: Jillian Morris  Procedure(s) Performed: ESOPHAGOGASTRODUODENOSCOPY (EGD) WITH PROPOFOL (N/A ) SAVORY DILATION vs balloon vs Botox injection (N/A ) BOTOX INJECTION  Patient Location: Endoscopy Unit  Anesthesia Type:MAC  Level of Consciousness: drowsy and patient cooperative  Airway & Oxygen Therapy: Patient Spontanous Breathing and Patient connected to face mask oxygen  Post-op Assessment: Report given to RN and Post -op Vital signs reviewed and stable  Post vital signs: Reviewed and stable  Last Vitals:  Vitals Value Taken Time  BP    Temp    Pulse    Resp    SpO2      Last Pain:  Vitals:   11/12/20 0948  TempSrc: Oral  PainSc: 8          Complications: No complications documented.

## 2020-11-12 NOTE — H&P (Signed)
Date of Initial H&P: 11/07/20  History reviewed, patient examined, no change in status, stable for surgery.

## 2020-11-13 ENCOUNTER — Encounter (HOSPITAL_COMMUNITY): Payer: Self-pay | Admitting: Gastroenterology

## 2021-01-06 DIAGNOSIS — L719 Rosacea, unspecified: Secondary | ICD-10-CM | POA: Insufficient documentation

## 2021-01-06 DIAGNOSIS — K589 Irritable bowel syndrome without diarrhea: Secondary | ICD-10-CM | POA: Insufficient documentation

## 2021-01-06 DIAGNOSIS — Z8719 Personal history of other diseases of the digestive system: Secondary | ICD-10-CM | POA: Insufficient documentation

## 2021-01-06 DIAGNOSIS — I890 Lymphedema, not elsewhere classified: Secondary | ICD-10-CM | POA: Insufficient documentation

## 2021-01-06 DIAGNOSIS — E039 Hypothyroidism, unspecified: Secondary | ICD-10-CM | POA: Insufficient documentation

## 2021-01-06 DIAGNOSIS — I10 Essential (primary) hypertension: Secondary | ICD-10-CM | POA: Insufficient documentation

## 2021-01-06 DIAGNOSIS — R296 Repeated falls: Secondary | ICD-10-CM | POA: Insufficient documentation

## 2021-01-06 DIAGNOSIS — D649 Anemia, unspecified: Secondary | ICD-10-CM | POA: Insufficient documentation

## 2021-01-06 DIAGNOSIS — E0500 Thyrotoxicosis with diffuse goiter without thyrotoxic crisis or storm: Secondary | ICD-10-CM | POA: Insufficient documentation

## 2021-01-06 DIAGNOSIS — M1990 Unspecified osteoarthritis, unspecified site: Secondary | ICD-10-CM | POA: Insufficient documentation

## 2021-01-06 DIAGNOSIS — K219 Gastro-esophageal reflux disease without esophagitis: Secondary | ICD-10-CM | POA: Insufficient documentation

## 2021-01-06 DIAGNOSIS — G5710 Meralgia paresthetica, unspecified lower limb: Secondary | ICD-10-CM | POA: Insufficient documentation

## 2021-01-06 DIAGNOSIS — D509 Iron deficiency anemia, unspecified: Secondary | ICD-10-CM | POA: Insufficient documentation

## 2021-01-06 DIAGNOSIS — R1310 Dysphagia, unspecified: Secondary | ICD-10-CM | POA: Insufficient documentation

## 2021-01-06 DIAGNOSIS — E538 Deficiency of other specified B group vitamins: Secondary | ICD-10-CM | POA: Insufficient documentation

## 2021-01-09 ENCOUNTER — Ambulatory Visit: Payer: Medicare Other | Admitting: Cardiology

## 2021-01-09 ENCOUNTER — Other Ambulatory Visit: Payer: Self-pay

## 2021-01-09 ENCOUNTER — Encounter: Payer: Self-pay | Admitting: Cardiology

## 2021-01-09 VITALS — BP 130/70 | HR 75 | Ht 64.0 in | Wt 178.0 lb

## 2021-01-09 DIAGNOSIS — R079 Chest pain, unspecified: Secondary | ICD-10-CM

## 2021-01-09 DIAGNOSIS — R0602 Shortness of breath: Secondary | ICD-10-CM

## 2021-01-09 DIAGNOSIS — R7303 Prediabetes: Secondary | ICD-10-CM | POA: Diagnosis not present

## 2021-01-09 DIAGNOSIS — E785 Hyperlipidemia, unspecified: Secondary | ICD-10-CM | POA: Diagnosis not present

## 2021-01-09 NOTE — Progress Notes (Signed)
Cardiology Office Note:    Date:  01/13/2021   ID:  Jillian Morris, DOB 1945-10-25, MRN [id number]  PCP:  Johny Blamer, MD  Cardiologist:  None  Electrophysiologist:  None   Referring MD: Johny Blamer, MD   I have been having some chest pain.  History of Present Illness:    Jillian Morris is a 75 y.o. female with a hx of prediabetes, hypertension, family history of coronary artery disease is here today to be evaluated for chest discomfort.  The patient tells about 3 weeks ago she woke up at 4:00 in the morning when she had significant heaviness in her chest.  She did have associated shortness of breath.  Lasted for few minutes and then resolved. Since that time she has had off and on to the 5 out of 10 intermittent chest heaviness.  It is mostly diffuse does not radiate.  She is concerned as she does have family history of coronary artery disease therefore she presented to be evaluated.  Past Medical History:  Diagnosis Date  . Achalasia 08/09/2013  . Acne rosacea   . Anemia    s/p gastric bypass, malabsorption  . Arthritis   . B12 deficiency   . Bipolar II disorder (HCC) 04/28/2018  . Chronic fatigue fibromyalgia syndrome   . DDD (degenerative disc disease), cervical 06/29/2017  . DDD (degenerative disc disease), lumbar 06/29/2017   s/p laminectomy   . Dysphagia    due to esophageal narrowing, able to take meds with thin liquid sif necessary   . Esophageal reflux 03/21/2012  . Fibromyalgia   . Frequent falls    last fall was 03-09-2019 , my knee gave out. other times i feel like its vertigo . like a spacey feeling " . denies loc or loss of time , did not hit head   . GAD (generalized anxiety disorder) 07/10/2018  . GERD (gastroesophageal reflux disease)   . Graves disease   . H/O hiatal hernia   . HTN (hypertension)    was briefly on losartan back early 2020 , had a severe reaction and was hospitalized , losartan was dc'd and she report shas not been prescribed any new  bp meds as of yet, denies lasting issues from hospitalization   . Hypothyroidism   . IBS (irritable bowel syndrome)    resolved with use of oxymorhpne   . Lymphedema   . Major depressive disorder   . Meralgia paraesthetica   . Sjogren's disease (HCC) 06/29/2017  . Status post dilation of esophageal narrowing   . Stricture and stenosis of esophagus 03/30/2012  . Traumatic subarachnoid bleed with LOC of 1 hour to 5 hours 59 minutes (HCC) 04/16/2016    Past Surgical History:  Procedure Laterality Date  . ABDOMINAL HYSTERECTOMY    . APPENDECTOMY    . AUGMENTATION MAMMAPLASTY Bilateral    in her 40's pt had a reaction to her implants, she had them removed  . BALLOON DILATION  03/30/2012   Procedure: BALLOON DILATION;  Surgeon: Louis Meckel, MD;  Location: [facility];  Service: Endoscopy;  Laterality: N/A;  . BOTOX INJECTION N/A 12/26/2014   Procedure: BOTOX INJECTION;  Surgeon: Louis Meckel, MD;  Location: [facility];  Service: Endoscopy;  Laterality: N/A;  . BOTOX INJECTION  11/12/2020   Procedure: BOTOX INJECTION;  Surgeon: Charlott Rakes, MD;  Location: [facility];  Service: Endoscopy;;  . ENDOMETRIAL ABLATION     x 4  . ESOPHAGEAL MANOMETRY N/A 05/15/2013   Procedure: ESOPHAGEAL  MANOMETRY (EM);  Surgeon: Louis Meckelobert D Kaplan, MD;  [facility];  Service: Endoscopy;  Laterality: N/A;  . ESOPHAGOGASTRODUODENOSCOPY  03/30/2012   Procedure: ESOPHAGOGASTRODUODENOSCOPY (EGD);  Surgeon: Louis Meckelobert D Kaplan, MD;  [facility];  Service: Endoscopy;  Laterality: N/A;  . ESOPHAGOGASTRODUODENOSCOPY  05/04/2012   Procedure: ESOPHAGOGASTRODUODENOSCOPY (EGD);  Surgeon: Louis Meckelobert D Kaplan, MD;  [facility];  Service: Endoscopy;  Laterality: N/A;  . ESOPHAGOGASTRODUODENOSCOPY N/A 12/26/2014   Procedure: ESOPHAGOGASTRODUODENOSCOPY (EGD);  Surgeon: Louis Meckelobert D Kaplan, MD;  [facility];  Service: Endoscopy;  Laterality: N/A;  botox injection  . ESOPHAGOGASTRODUODENOSCOPY  (EGD) WITH PROPOFOL N/A 11/12/2020   Procedure: ESOPHAGOGASTRODUODENOSCOPY (EGD) WITH PROPOFOL;  Surgeon: Charlott RakesSchooler, Vincent, MD;  [facility]: [facility];  Service: Endoscopy;  Laterality: N/A;  . GASTRIC BYPASS    . JOINT REPLACEMENT     right knee   . KNEE ARTHROPLASTY    . LAMINECTOMY     L3-L5  . OPEN REDUCTION INTERNAL FIXATION (ORIF) DISTAL RADIAL FRACTURE Right 04/23/2016   Procedure: OPEN REDUCTION INTERNAL FIXATION (ORIF)  RIGHT DISTAL RADIAL FRACTURE;  Surgeon: Betha LoaKevin Kuzma, MD;  Location: [facility];  Service: Orthopedics;  Laterality: Right;  . ORIF TRIPOD FRACTURE Right 04/19/2016   Procedure: OPEN REDUCTION INTERNAL FIXATION (ORIF) TRIPOD FRACTURE LIEBINGER MIDFACE NO DRILL;  Surgeon: Newman PiesSu Teoh, MD;  Location: [facility];  Service: ENT;  Laterality: Right;  . 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 THYROIDECTOMY    . TOTAL KNEE ARTHROPLASTY Left 03/13/2019   Procedure: TOTAL KNEE ARTHROPLASTY;  Surgeon: Dannielle HuhLucey, Steve, MD;  Location: [facility];  Service: Orthopedics;  Laterality: Left;    Current Medications: Current Meds  Medication Sig  . ALPRAZolam (XANAX) 0.5 MG tablet Take 0.5 mg by mouth 2 (two) times daily as needed for anxiety.  Marland Kitchen. amLODipine (NORVASC) 2.5 MG tablet Take 2.5 mg by mouth daily.  . carboxymethylcellulose (REFRESH PLUS) 0.5 % SOLN Place 1 drop into both eyes 3 (three) times daily as needed (dry eyes).  . DULoxetine (CYMBALTA) 60 MG capsule TAKE 2 CAPSULES (120 MG TOTAL) BY MOUTH EVERY EVENING.  . ferrous sulfate 325 (65 FE) MG tablet Take 325 mg by mouth daily with breakfast.  . lamoTRIgine (LAMICTAL) 100 MG tablet TAKE 3 TABLETS (300 MG TOTAL) BY MOUTH AT BEDTIME.  Marland Kitchen. levothyroxine (SYNTHROID) 125 MCG tablet Take 125 mcg by mouth daily before breakfast.  . LORazepam (ATIVAN) 1 MG tablet Take 1 mg by mouth at bedtime.  . meclizine (ANTIVERT) 12.5 MG tablet Take 12.5 mg by mouth 3 (three) times daily as needed for dizziness.  Marland Kitchen. omeprazole (PRILOSEC) 40 MG capsule Take 40 mg  by mouth in the morning and at bedtime.  Marland Kitchen. oxymetazoline (VICKS SINEX) 0.05 % nasal spray Place 1 spray into both nostrils 2 (two) times daily as needed for congestion.  Marland Kitchen. oxymorphone (OPANA) 5 MG tablet Take 5 mg by mouth every 6 (six) hours as needed for pain.  . Probiotic Product (PROBIOTIC DAILY PO) Take 2 capsules by mouth daily.  Marland Kitchen. rOPINIRole (REQUIP) 1 MG tablet Take 1 mg by mouth at bedtime as needed (restless leg syndrome).   . [DISCONTINUED] methocarbamol (ROBAXIN) 750 MG tablet Take 750 mg by mouth every 6 (six) hours as needed for muscle spasms.  . [DISCONTINUED] topiramate (TOPAMAX) 25 MG tablet Take 25 mg by mouth 2 (two) times daily.     Allergies:   Losartan, Actonel [risedronate sodium], Ambien [zolpidem tartrate], Depakote [divalproex sodium], Inderal [propranolol], Nsaids, Penicillins,  Silicon, Adhesive [tape], Cefuroxime axetil, Latex, and Nickel   Social History   Socioeconomic History  . Marital status: Married    Spouse name: Not on file  . Number of children: 0  . Years of education: 76  . Highest education level: Master's degree (e.g., MA, MS, MEng, MEd, MSW, MBA)  Occupational History  . Occupation: housewife  Tobacco Use  . Smoking status: Never Smoker  . Smokeless tobacco: Never Used  Vaping Use  . Vaping Use: Never used  Substance and Sexual Activity  . Alcohol use: No    Alcohol/week: 0.0 standard drinks  . Drug use: No  . Sexual activity: Not on file  Other Topics Concern  . Not on file  Social History Narrative   Right handed, Married, Step kids 2, Caffeine 4-5 cups daily, graduate school   Rare caffeine use    Social Determinants of Health   Financial Resource Strain: Not on file  Food Insecurity: Not on file  Transportation Needs: Not on file  Physical Activity: Not on file  Stress: Not on file  Social Connections: Not on file     Family History: The patient's family history includes Appendicitis in her paternal grandmother; Dementia  in her mother; Heart disease in her father and sister; Irritable bowel syndrome in her sister; Lymphoma in her maternal uncle; Prostate cancer in her maternal grandfather; Stroke in her maternal grandmother. There is no history of Colon cancer.  ROS:   Review of Systems  Constitution: Negative for decreased appetite, fever and weight gain.  HENT: Negative for congestion, ear discharge, hoarse voice and sore throat.   Eyes: Negative for discharge, redness, vision loss in right eye and visual halos.  Cardiovascular: Reports chest pain, dyspnea on exertion.  Negative for, leg swelling, orthopnea and palpitations.  Respiratory: Negative for cough, hemoptysis, shortness of breath and snoring.   Endocrine: Negative for heat intolerance and polyphagia.  Hematologic/Lymphatic: Negative for bleeding problem. Does not bruise/bleed easily.  Skin: Negative for flushing, nail changes, rash and suspicious lesions.  Musculoskeletal: Negative for arthritis, joint pain, muscle cramps, myalgias, neck pain and stiffness.  Gastrointestinal: Negative for abdominal pain, bowel incontinence, diarrhea and excessive appetite.  Genitourinary: Negative for decreased libido, genital sores and incomplete emptying.  Neurological: Negative for brief paralysis, focal weakness, headaches and loss of balance.  Psychiatric/Behavioral: Negative for altered mental status, depression and suicidal ideas.  Allergic/Immunologic: Negative for HIV exposure and persistent infections.    EKGs/Labs/Other Studies Reviewed:    The following studies were reviewed today:   EKG:  The ekg ordered today demonstrates sinus rhythm, heart rate 75 bpm with no ST segment changes.  Recent Labs: No results found for requested labs within last 8760 hours.  Recent Lipid Panel No results found for: CHOL, TRIG, HDL, CHOLHDL, VLDL, LDLCALC, LDLDIRECT  Physical Exam:    VS:  BP 130/70 (BP Location: Right Arm)   Pulse 75   Ht  (1.626 m)    Wt 178 lb (80.7 kg)   SpO2 97%   BMI 30.55 kg/m     Wt Readings from Last 3 Encounters:  01/09/21 178 lb (80.7 kg)  11/12/20 183 lb (83 kg)  03/05/20 180 lb (81.6 kg)     GEN: Well nourished, well developed in no acute distress HEENT: Normal NECK: No JVD; No carotid bruits LYMPHATICS: No lymphadenopathy CARDIAC: S1S2 noted,RRR, 2 out of 6 midsystolic ejection murmurs, rubs, gallops RESPIRATORY:  Clear to auscultation without rales, wheezing or rhonchi  ABDOMEN:  Soft, non-tender, non-distended, +bowel sounds, no guarding. EXTREMITIES: No edema, No cyanosis, no clubbing MUSCULOSKELETAL:  No deformity  SKIN: Warm and dry NEUROLOGIC:  Alert and oriented x 3, non-focal PSYCHIATRIC:  Normal affect, good insight  ASSESSMENT:    1. Hyperlipidemia, unspecified hyperlipidemia type   2. Chest pain, unspecified type   3. SOB (shortness of breath)   4. Prediabetes    PLAN:    Giving her murmur on exam as well as her chest discomfort with abnormal EKG and her risk factors I like to have the patient undergo a stress echocardiogram to assess to make sure that coronary artery disease not playing a role here.  This will also help to make sure that there is no valvular abnormalities on her mid ejection systolic murmur heard on physical exam.  The patient understands the need to lose weight with diet and exercise. We have discussed specific strategies for this.  For prediabetes we will get a hemoglobin A1c as well.  The patient is in agreement with the above plan. The patient left the office in stable condition.  The patient will follow up in   Medication Adjustments/Labs and Tests Ordered: Current medicines are reviewed at length with the patient today.  Concerns regarding medicines are outlined above.  Orders Placed This Encounter  Procedures  . Hemoglobin A1c  . MYOCARDIAL PERFUSION IMAGING  . EKG 12-Lead  . ECHOCARDIOGRAM COMPLETE   No orders of the defined types were placed in  this encounter.   Patient Instructions   Medication Instructions:  Your physician recommends that you continue on your current medications as directed. Please refer to the Current Medication list given to you today.  *If you need a refill on your cardiac medications before your next appointment, please call your pharmacy*   Lab Work: Your physician recommends that you return for lab work: TODAY: HbgA1C If you have labs (blood work) drawn today and your tests are completely normal, you will receive your results only by: Marland Kitchen MyChart Message (if you have MyChart) OR . A paper copy in the mail If you have any lab test that is abnormal or we need to change your treatment, we will call you to review the results.   Testing/Procedures: Your physician has requested that you have an echocardiogram. Echocardiography is a painless test that uses sound waves to create images of your heart. It provides your doctor with information about the size and shape of your heart and how well your heart's chambers and valves are working. This procedure takes approximately one hour. There are no restrictions for this procedure.  Your physician has requested that you have a lexiscan myoview. For further information please visit [email or web address] Please follow instruction sheet, as given.   Follow-Up: At [facility], you and your health needs are our priority.  As part of our continuing mission to provide you with exceptional heart care, we have created designated Provider Care Teams.  These Care Teams include your primary Cardiologist (physician) and Advanced Practice Providers (APPs -  Physician Assistants and Nurse Practitioners) who all work together to provide you with the care you need, when you need it.  We recommend signing up for the patient portal called "MyChart".  Sign up information is provided on this After Visit Summary.  MyChart is used to connect with patients for Virtual Visits (Telemedicine).   Patients are able to view lab/test results, encounter notes, upcoming appointments, etc.  Non-urgent messages can be sent to your provider as well.  To learn more about what you can do with MyChart, go to [email or web address].    Your next appointment:   12 week(s)  The format for your next appointment:   In Person  Provider:   Thomasene Ripple, DO   Other Instructions  Echocardiogram An echocardiogram is a test that uses sound waves (ultrasound) to produce images of the heart. Images from an echocardiogram can provide important information about:  Heart size and shape.  The size and thickness and movement of your heart's walls.  Heart muscle function and strength.  Heart valve function or if you have stenosis. Stenosis is when the heart valves are too narrow.  If blood is flowing backward through the heart valves (regurgitation).  A tumor or infectious growth around the heart valves.  Areas of heart muscle that are not working well because of poor blood flow or injury from a heart attack.  Aneurysm detection. An aneurysm is a weak or damaged part of an artery wall. The wall bulges out from the normal force of blood pumping through the body. Tell a health care provider about:  Any allergies you have.  All medicines you are taking, including vitamins, herbs, eye drops, creams, and over-the-counter medicines.  Any blood disorders you have.  Any surgeries you have had.  Any medical conditions you have.  Whether you are pregnant or may be pregnant. What are the risks? Generally, this is a safe test. However, problems may occur, including an allergic reaction to dye (contrast) that may be used during the test. What happens before the test? No specific preparation is needed. You may eat and drink normally. What happens during the test?  You will take off your clothes from the waist up and put on a hospital gown.  Electrodes or electrocardiogram (ECG)patches may be  placed on your chest. The electrodes or patches are then connected to a device that monitors your heart rate and rhythm.  You will lie down on a table for an ultrasound exam. A gel will be applied to your chest to help sound waves pass through your skin.  A handheld device, called a transducer, will be pressed against your chest and moved over your heart. The transducer produces sound waves that travel to your heart and bounce back (or "echo" back) to the transducer. These sound waves will be captured in real-time and changed into images of your heart that can be viewed on a video monitor. The images will be recorded on a computer and reviewed by your health care provider.  You may be asked to change positions or hold your breath for a short time. This makes it easier to get different views or better views of your heart.  In some cases, you may receive contrast through an IV in one of your veins. This can improve the quality of the pictures from your heart. The procedure may vary among health care providers and hospitals.   What can I expect after the test? You may return to your normal, everyday life, including diet, activities, and medicines, unless your health care provider tells you not to do that. Follow these instructions at home:  It is up to you to get the results of your test. Ask your health care provider, or the department that is doing the test, when your results will be ready.  Keep all follow-up visits. This is important. Summary  An echocardiogram is a test that uses sound waves (ultrasound) to produce images of the heart.  Images from  an echocardiogram can provide important information about the size and shape of your heart, heart muscle function, heart valve function, and other possible heart problems.  You do not need to do anything to prepare before this test. You may eat and drink normally.  After the echocardiogram is completed, you may return to your normal, everyday  life, unless your health care provider tells you not to do that. This information is not intended to replace advice given to you by your health care provider. Make sure you discuss any questions you have with your health care provider. Document Revised: 04/02/2020 Document Reviewed: 04/02/2020 Elsevier Patient Education  2021 Elsevier Inc.  .  [facility] at [facility] [address] [place], [place] [address] Phone: [phone]    Please arrive 15 minutes prior to your appointment time for registration and insurance purposes.  The test will take approximately 3 to 4 hours to complete; you may bring reading material.  If someone comes with you to your appointment, they will need to remain in the main lobby due to limited space in the testing area. **If you are pregnant or breastfeeding, please notify the nuclear lab prior to your appointment**  How to prepare for your Myocardial Perfusion Test: . Do not eat or drink 3 hours prior to your test, except you may have water. . Do not consume products containing caffeine (regular or decaffeinated) 12 hours prior to your test. (ex: coffee, chocolate, sodas, tea). . Do bring a list of your current medications with you.  If not listed below, you may take your medications as normal. . Do wear comfortable clothes (no dresses or overalls) and walking shoes, tennis shoes preferred (No heels or open toe shoes are allowed). . Do NOT wear cologne, perfume, aftershave, or lotions (deodorant is allowed). . If these instructions are not followed, your test will have to be rescheduled.  Please report to [address] for your test.  If you have questions or concerns about your appointment, you can call the Nuclear Lab at [phone].  If you cannot keep your appointment, please provide 24 hours notification to the Nuclear Lab, to avoid a possible $50 charge to your account.  Cardiac Nuclear  Scan A cardiac nuclear scan is a test that is done to check the flow of blood to your heart. It is done when you are resting and when you are exercising. The test looks for problems such as:  Not enough blood reaching a portion of the heart.  The heart muscle not working as it should. You may need this test if:  You have heart disease.  You have had lab results that are not normal.  You have had heart surgery or a balloon procedure to open up blocked arteries (angioplasty).  You have chest pain.  You have shortness of breath. In this test, a special dye (tracer) is put into your bloodstream. The tracer will travel to your heart. A camera will then take pictures of your heart to see how the tracer moves through your heart. This test is usually done at a hospital and takes 2-4 hours. Tell a doctor about:  Any allergies you have.  All medicines you are taking, including vitamins, herbs, eye drops, creams, and over-the-counter medicines.  Any problems you or family members have had with anesthetic medicines.  Any blood disorders you have.  Any surgeries you have had.  Any medical conditions you have.  Whether you are  pregnant or may be pregnant. What are the risks? Generally, this is a safe test. However, problems may occur, such as:  Serious chest pain and heart attack. This is only a risk if the stress portion of the test is done.  Rapid heartbeat.  A feeling of warmth in your chest. This feeling usually does not last long.  Allergic reaction to the tracer. What happens before the test?  Ask your doctor about changing or stopping your normal medicines. This is important.  Follow instructions from your doctor about what you cannot eat or drink.  Remove your jewelry on the day of the test. What happens during the test?  An IV tube will be inserted into one of your veins.  Your doctor will give you a small amount of tracer through the IV tube.  You will wait for  20-40 minutes while the tracer moves through your bloodstream.  Your heart will be monitored with an electrocardiogram (ECG).  You will lie down on an exam table.  Pictures of your heart will be taken for about 15-20 minutes.  You may also have a stress test. For this test, one of these things may be done: ? You will be asked to exercise on a treadmill or a stationary bike. ? You will be given medicines that will make your heart work harder. This is done if you are unable to exercise.  When blood flow to your heart has peaked, a tracer will again be given through the IV tube.  After 20-40 minutes, you will get back on the exam table. More pictures will be taken of your heart.  Depending on the tracer that is used, more pictures may need to be taken 3-4 hours later.  Your IV tube will be removed when the test is over. The test may vary among doctors and hospitals. What happens after the test?  Ask your doctor: ? Whether you can return to your normal schedule, including diet, activities, and medicines. ? Whether you should drink more fluids. This will help to remove the tracer from your body. Drink enough fluid to keep your pee (urine) pale yellow.  Ask your doctor, or the department that is doing the test: ? When will my results be ready? ? How will I get my results? Summary  A cardiac nuclear scan is a test that is done to check the flow of blood to your heart.  Tell your doctor whether you are pregnant or may be pregnant.  Before the test, ask your doctor about changing or stopping your normal medicines. This is important.  Ask your doctor whether you can return to your normal activities. You may be asked to drink more fluids. This information is not intended to replace advice given to you by your health care provider. Make sure you discuss any questions you have with your health care provider. Document Revised: 11/30/2018 Document Reviewed: 01/24/2018 Elsevier Patient  Education  2021 Elsevier Inc.     Adopting a Healthy Lifestyle.  Know what a healthy weight is for you (roughly BMI <25) and aim to maintain this   Aim for 7+ servings of fruits and vegetables daily   65-80+ fluid ounces of water or unsweet tea for healthy kidneys   Limit to max 1 drink of alcohol per day; avoid smoking/tobacco   Limit animal fats in diet for cholesterol and heart health - choose grass fed whenever available   Avoid highly processed foods, and foods high in saturated/trans fats  Aim for low stress - take time to unwind and care for your mental health   Aim for 150 min of moderate intensity exercise weekly for heart health, and weights twice weekly for bone health   Aim for 7-9 hours of sleep daily   When it comes to diets, agreement about the perfect plan isnt easy to find, even among the experts. Experts at the [facility] [employer] developed an idea known as the Healthy Eating Plate. Just imagine a plate divided into logical, healthy portions.   The emphasis is on diet quality:   Load up on vegetables and fruits - one-half of your plate: Aim for color and variety, and remember that potatoes dont count.   Go for whole grains - one-quarter of your plate: Whole wheat, barley, wheat berries, quinoa, oats, brown rice, and foods made with them. If you want pasta, go with whole wheat pasta.   Protein power - one-quarter of your plate: Fish, chicken, beans, and nuts are all healthy, versatile protein sources. Limit red meat.   The diet, however, does go beyond the plate, offering a few other suggestions.   Use healthy plant oils, such as olive, canola, soy, corn, sunflower and peanut. Check the labels, and avoid partially hydrogenated oil, which have unhealthy trans fats.   If youre thirsty, drink water. Coffee and tea are good in moderation, but skip sugary drinks and limit milk and dairy products to one or two daily servings.   The type of  carbohydrate in the diet is more important than the amount. Some sources of carbohydrates, such as vegetables, fruits, whole grains, and beans-are healthier than others.   Finally, stay active  Signed, Thomasene Ripple, DO  01/13/2021 12:13 PM     [facility]

## 2021-01-09 NOTE — Patient Instructions (Signed)
Medication Instructions:  Your physician recommends that you continue on your current medications as directed. Please refer to the Current Medication list given to you today.  *If you need a refill on your cardiac medications before your next appointment, please call your pharmacy*   Lab Work: Your physician recommends that you return for lab work: TODAY: [id number] If you have labs (blood work) drawn today and your tests are completely normal, you will receive your results only by: Marland Kitchen MyChart Message (if you have MyChart) OR . A paper copy in the mail If you have any lab test that is abnormal or we need to change your treatment, we will call you to review the results.   Testing/Procedures: Your physician has requested that you have an echocardiogram. Echocardiography is a painless test that uses sound waves to create images of your heart. It provides your doctor with information about the size and shape of your heart and how well your heart's chambers and valves are working. This procedure takes approximately one hour. There are no restrictions for this procedure.  Your physician has requested that you have a lexiscan myoview. For further information please visit [email or web address] Please follow instruction sheet, as given.   Follow-Up: At [facility], you and your health needs are our priority.  As part of our continuing mission to provide you with exceptional heart care, we have created designated Provider Care Teams.  These Care Teams include your primary Cardiologist (physician) and Advanced Practice Providers (APPs -  Physician Assistants and Nurse Practitioners) who all work together to provide you with the care you need, when you need it.  We recommend signing up for the patient portal called "MyChart".  Sign up information is provided on this After Visit Summary.  MyChart is used to connect with patients for Virtual Visits (Telemedicine).  Patients are able to view lab/test results,  encounter notes, upcoming appointments, etc.  Non-urgent messages can be sent to your provider as well.   To learn more about what you can do with MyChart, go to [email or web address].    Your next appointment:   12 week(s)  The format for your next appointment:   In Person  Provider:   Thomasene Ripple, DO   Other Instructions  Echocardiogram An echocardiogram is a test that uses sound waves (ultrasound) to produce images of the heart. Images from an echocardiogram can provide important information about:  Heart size and shape.  The size and thickness and movement of your heart's walls.  Heart muscle function and strength.  Heart valve function or if you have stenosis. Stenosis is when the heart valves are too narrow.  If blood is flowing backward through the heart valves (regurgitation).  A tumor or infectious growth around the heart valves.  Areas of heart muscle that are not working well because of poor blood flow or injury from a heart attack.  Aneurysm detection. An aneurysm is a weak or damaged part of an artery wall. The wall bulges out from the normal force of blood pumping through the body. Tell a health care provider about:  Any allergies you have.  All medicines you are taking, including vitamins, herbs, eye drops, creams, and over-the-counter medicines.  Any blood disorders you have.  Any surgeries you have had.  Any medical conditions you have.  Whether you are pregnant or may be pregnant. What are the risks? Generally, this is a safe test. However, problems may occur, including an allergic reaction to dye (contrast) that may be used  during the test. What happens before the test? No specific preparation is needed. You may eat and drink normally. What happens during the test?  You will take off your clothes from the waist up and put on a hospital gown.  Electrodes or electrocardiogram (ECG)patches may be placed on your chest. The electrodes or patches  are then connected to a device that monitors your heart rate and rhythm.  You will lie down on a table for an ultrasound exam. A gel will be applied to your chest to help sound waves pass through your skin.  A handheld device, called a transducer, will be pressed against your chest and moved over your heart. The transducer produces sound waves that travel to your heart and bounce back (or "echo" back) to the transducer. These sound waves will be captured in real-time and changed into images of your heart that can be viewed on a video monitor. The images will be recorded on a computer and reviewed by your health care provider.  You may be asked to change positions or hold your breath for a short time. This makes it easier to get different views or better views of your heart.  In some cases, you may receive contrast through an IV in one of your veins. This can improve the quality of the pictures from your heart. The procedure may vary among health care providers and hospitals.   What can I expect after the test? You may return to your normal, everyday life, including diet, activities, and medicines, unless your health care provider tells you not to do that. Follow these instructions at home:  It is up to you to get the results of your test. Ask your health care provider, or the department that is doing the test, when your results will be ready.  Keep all follow-up visits. This is important. Summary  An echocardiogram is a test that uses sound waves (ultrasound) to produce images of the heart.  Images from an echocardiogram can provide important information about the size and shape of your heart, heart muscle function, heart valve function, and other possible heart problems.  You do not need to do anything to prepare before this test. You may eat and drink normally.  After the echocardiogram is completed, you may return to your normal, everyday life, unless your health care provider tells you  not to do that. This information is not intended to replace advice given to you by your health care provider. Make sure you discuss any questions you have with your health care provider. Document Revised: 04/02/2020 Document Reviewed: 04/02/2020 Elsevier Patient Education  2021 Elsevier Inc.  .  [facility] at [facility] [address] [place], [place] [address] Phone: [phone]    Please arrive 15 minutes prior to your appointment time for registration and insurance purposes.  The test will take approximately 3 to 4 hours to complete; you may bring reading material.  If someone comes with you to your appointment, they will need to remain in the main lobby due to limited space in the testing area. **If you are pregnant or breastfeeding, please notify the nuclear lab prior to your appointment**  How to prepare for your Myocardial Perfusion Test: . Do not eat or drink 3 hours prior to your test, except you may have water. . Do not consume products containing caffeine (regular or decaffeinated) 12 hours prior to your test. (ex: coffee, chocolate, sodas, tea). . Do bring a list of  your current medications with you.  If not listed below, you may take your medications as normal. . Do wear comfortable clothes (no dresses or overalls) and walking shoes, tennis shoes preferred (No heels or open toe shoes are allowed). . Do NOT wear cologne, perfume, aftershave, or lotions (deodorant is allowed). . If these instructions are not followed, your test will have to be rescheduled.  Please report to [address] for your test.  If you have questions or concerns about your appointment, you can call the Nuclear Lab at [phone].  If you cannot keep your appointment, please provide 24 hours notification to the Nuclear Lab, to avoid a possible $50 charge to your account.  Cardiac Nuclear Scan A cardiac nuclear scan is a test that is done  to check the flow of blood to your heart. It is done when you are resting and when you are exercising. The test looks for problems such as:  Not enough blood reaching a portion of the heart.  The heart muscle not working as it should. You may need this test if:  You have heart disease.  You have had lab results that are not normal.  You have had heart surgery or a balloon procedure to open up blocked arteries (angioplasty).  You have chest pain.  You have shortness of breath. In this test, a special dye (tracer) is put into your bloodstream. The tracer will travel to your heart. A camera will then take pictures of your heart to see how the tracer moves through your heart. This test is usually done at a hospital and takes 2-4 hours. Tell a doctor about:  Any allergies you have.  All medicines you are taking, including vitamins, herbs, eye drops, creams, and over-the-counter medicines.  Any problems you or family members have had with anesthetic medicines.  Any blood disorders you have.  Any surgeries you have had.  Any medical conditions you have.  Whether you are pregnant or may be pregnant. What are the risks? Generally, this is a safe test. However, problems may occur, such as:  Serious chest pain and heart attack. This is only a risk if the stress portion of the test is done.  Rapid heartbeat.  A feeling of warmth in your chest. This feeling usually does not last long.  Allergic reaction to the tracer. What happens before the test?  Ask your doctor about changing or stopping your normal medicines. This is important.  Follow instructions from your doctor about what you cannot eat or drink.  Remove your jewelry on the day of the test. What happens during the test?  An IV tube will be inserted into one of your veins.  Your doctor will give you a small amount of tracer through the IV tube.  You will wait for 20-40 minutes while the tracer moves through your  bloodstream.  Your heart will be monitored with an electrocardiogram (ECG).  You will lie down on an exam table.  Pictures of your heart will be taken for about 15-20 minutes.  You may also have a stress test. For this test, one of these things may be done: ? You will be asked to exercise on a treadmill or a stationary bike. ? You will be given medicines that will make your heart work harder. This is done if you are unable to exercise.  When blood flow to your heart has peaked, a tracer will again be given through the IV tube.  After  20-40 minutes, you will get back on the exam table. More pictures will be taken of your heart.  Depending on the tracer that is used, more pictures may need to be taken 3-4 hours later.  Your IV tube will be removed when the test is over. The test may vary among doctors and hospitals. What happens after the test?  Ask your doctor: ? Whether you can return to your normal schedule, including diet, activities, and medicines. ? Whether you should drink more fluids. This will help to remove the tracer from your body. Drink enough fluid to keep your pee (urine) pale yellow.  Ask your doctor, or the department that is doing the test: ? When will my results be ready? ? How will I get my results? Summary  A cardiac nuclear scan is a test that is done to check the flow of blood to your heart.  Tell your doctor whether you are pregnant or may be pregnant.  Before the test, ask your doctor about changing or stopping your normal medicines. This is important.  Ask your doctor whether you can return to your normal activities. You may be asked to drink more fluids. This information is not intended to replace advice given to you by your health care provider. Make sure you discuss any questions you have with your health care provider. Document Revised: 11/30/2018 Document Reviewed: 01/24/2018 Elsevier Patient Education  2021 [employer]

## 2021-01-10 LAB — HEMOGLOBIN A1C
Est. average glucose Bld gHb Est-mCnc: 105 mg/dL
Hgb A1c MFr Bld: 5.3 % (ref 4.8–5.6)

## 2021-01-17 ENCOUNTER — Telehealth: Payer: Self-pay

## 2021-01-17 NOTE — Telephone Encounter (Signed)
Left message for patient to return our call.

## 2021-02-13 ENCOUNTER — Telehealth (HOSPITAL_COMMUNITY): Payer: Self-pay | Admitting: *Deleted

## 2021-02-13 NOTE — Telephone Encounter (Signed)
Left message on voicemail per DPR in reference to upcoming appointment scheduled on 6/29/22with detailed instructions given per Myocardial Perfusion Study Information Sheet for the test. LM to arrive 15 minutes early, and that it is imperative to arrive on time for appointment to keep from having the test rescheduled. If you need to cancel or reschedule your appointment, please call the office within 24 hours of your appointment. Failure to do so may result in a cancellation of your appointment, and a $50 no show fee. Phone number given for call back for any questions. Elcie Pelster Jacqueline   

## 2021-02-19 ENCOUNTER — Encounter (HOSPITAL_COMMUNITY): Payer: Medicare Other

## 2021-02-19 ENCOUNTER — Encounter (HOSPITAL_COMMUNITY): Payer: Self-pay | Admitting: Cardiology

## 2021-02-19 ENCOUNTER — Other Ambulatory Visit (HOSPITAL_COMMUNITY): Payer: Medicare Other

## 2021-03-03 ENCOUNTER — Telehealth (HOSPITAL_COMMUNITY): Payer: Self-pay | Admitting: Cardiology

## 2021-03-03 NOTE — Telephone Encounter (Signed)
Just an FYI. We have made several attempts to contact this patient including sending a letter to schedule or reschedule their Myoview. We will be removing the patient from the echo/NUC WQ.   02/19/21 NO SHOWED-MAILED LETTER LBW        Thank you

## 2021-03-06 ENCOUNTER — Encounter: Payer: Self-pay | Admitting: Neurology

## 2021-03-06 ENCOUNTER — Ambulatory Visit: Payer: Medicare Other | Admitting: Neurology

## 2021-03-06 VITALS — BP 157/88 | HR 75 | Ht 64.0 in | Wt 176.0 lb

## 2021-03-06 DIAGNOSIS — G3184 Mild cognitive impairment, so stated: Secondary | ICD-10-CM | POA: Diagnosis not present

## 2021-03-06 NOTE — Patient Instructions (Addendum)
It was nice to see you again today.  I am glad to hear that you are feeling stable.  Perhaps the reduction in Lamictal has helped you feel less groggy during the day.  Please continue regular follow-up with your psychiatrist and primary care. As discussed, I would like to request a repeat formal neuropsychological test (aka cognitive testing) for your memory complaints and for comparison with your evaluation in 2020.  I will refer you back to Dr. Rosann Auerbach.   Let's plan to follow-up routinely in 1 year, we can move up the appointment as needed.

## 2021-03-06 NOTE — Progress Notes (Signed)
Subjective:    Patient ID: Jillian Morris is a 75 y.o. female.  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  The patient is accompanied by her husband again today.  I last saw her on 03/05/2020, at which time she felt stable.  Her MMSE was 29 out of 30 at the time.  She had neuropsychological evaluation in December 2020 with reevaluation recommended in 18 to 24 months per Dr. Melvyn Novas.   Today, 03/06/2021: She reports feeling fairly stable.  She still has concentration problems and short-term memory issues but nothing more severe than last year.  She has not had any recent falls.  She has started seeing a different psychiatrist and is in the process of reducing her Lamictal.  She is currently on 200 mg once daily as opposed to 300 mg once daily before.  She does not take ropinirole daily for restless legs and only as needed, in fact, has not taken it even once a week.  She has been taking supplements more consistently including her iron supplement.   Previously:   I saw her on 09/06/2019, at which time we talked about her neuropsychological evaluation and recommendations.  Her MMSE was 27 at the time, she was encouraged to consider sleep study to rule out obstructive sleep apnea.  She was advised to follow-up for a recheck in 6 months and we will continue to monitor her symptoms.     I saw her on 05/30/2019, at which time  her memory scores were fairly stable.  She felt stable as well.  She had left total knee replacement in July 2020.  She was in physical therapy.  I suggested we proceed with neuropsychological evaluation.  I made a referral.  She was seen in the interim by Dr. Hazle Coca on 08/03/2019 with testing and a follow-up appointment on 08/11/2019.  I reviewed the test results and recommendations:   << Clinical Impression(s): Jillian Morris pattern of performance is suggestive of primary impairments surrounding attention/concentration and semantic fluency. Additional variability was noted across processing speed and cognitive flexibility. Performance across other aspects of executive functioning, receptive language, confrontation naming, phonemic fluency, visuospatial abilities, and learning and memory were within normal limits. 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 However, the mild severity of this condition should be emphasized currently.   Regarding etiology, Jillian Morris reported severe levels of acute anxiety and depression, as well as moderate ongoing sleep disturbances. It is very likely that psychiatric distress and sleep dysfunction are directly related to ongoing weaknesses in attention/concentration and cognitive flexibility. Should these symptoms become optimized, improvement may be possible. Specific to memory, Jillian Morris was able to learn novel verbal and visual information efficiently and retain this knowledge after lengthy delays. Overall, her cognitive and behavioral profile is not suggestive of Alzheimer's disease or any other neurodegenerative illness presently. However, it is important  to note that neuroimaging suggesting progressive medial temporal lobe atrophy does increase her  relative risk for developing Alzheimer's disease in the future. As such, continued monitoring will be important moving forward.   Recommendations: A repeat neuropsychological evaluation in 18-24 months (or sooner if functional decline is noted) is recommended to assess the trajectory of future cognitive decline should it occur. This will also aid in future efforts towards improved diagnostic clarity. >>    Of note, she missed an appointment on 05/18/2019. I saw her on 02/15/2019 after a long gap of almost 3 years.  She had been hospitalized in April 2020 secondary to rash and fever.  She was noted to have altered mental status and acute kidney injury.  She was advised to discontinue Xanax.  Upon discharge her creatinine had improved but was still mildly elevated.  Her MMSE was 27 out of 30 at the time.  I suggested we proceed with a brain MRI and neuropsychological evaluation.  She felt that her memory had declined over the past year.  She also felt that her mobility and balance had become worse.   She had a brain MRI without contrast on 04/01/2019 and I reviewed the results: IMPRESSION: This MRI of the brain without contrast shows the following: 1.   Moderate generalized cortical atrophy that is most pronounced in the mesial temporal lobes.  The atrophy has progressed compared to the 2017 MRI. 2.   T2/flair hyperintense foci in the pons and hemispheres in a pattern most consistent with chronic microvascular ischemic change.  None of the foci appear to be acute though there is mild progression compared to the 2017 MRI. 3.   No acute findings.   Dr. Jannifer Franklin called her with her test results in my absence.   She has not had neuropsychological evaluation.   She canceled an interim appointment in January 2018 and no showed for an appointment on 11/17/2016 as well as 02/04/2017. She is re-referred by her primary care physician, Dr. Shirline Frees. I saw her on 03/09/2016, at which time she reported more problems  with her memory, confusion while driving and several bouts of vertigo in the interim. She was re-referred by her PCP at the time. She was having trouble with her thyroid medication and thyroid function. She was getting B12 injections on a monthly basis. She was seeing Dr. Hardin Negus for pain management. She had some falls but thankfully without injuries reported. I had a long chat with her at the time regarding multifactorial gait disorder, fall risk and memory dysfunction. Her MMSE was: 29/30, CDT: 4/4, AFT: 13/min.   I suggested a brain MRI and EEG. Her EEG from 03/12/2016 showed: Impression: This is an essentially normal EEG recording in the waking state. Movement artifact was prominent during the recording. No evidence of ictal or interictal discharges are seen.   We called her with her test results.   She had a brain MRI without contrast on 03/22/2016 which showed: IMPRESSION:  This MRI of the brain without contrast shows the following: 1.   T2/FLAIR hyperintense foci in the pons and the hemispheres most consistent with chronic microvascular ischemic change. The extent has increased since the MRI from 2009. 2.    Moderate cortical atrophy most pronounced in the mesial temporal lobes. The extent of atrophy has increased when compared to the MRI dated 08/07/2008. 3.    There are no acute findings.   We called her with her test results.   Of note, she fell in the  interim. She was hospitalized in August 2017 after a fall with significant injuries. She was in the hospital from 04/16/2016 through 04/20/2016 and then transferred to rehabilitation. She fell at a doctor's office and sustained injuries including right distal radius fracture and traumatic subarachnoid hemorrhage, she had facial injuries including facial Fx, and was seen by ENT, neurosurgery, hand surgery, rehabilitation doctor and ophthalmology. She was on the trauma service. She was advised to follow-up as an outpatient with neurosurgery,  ophthalmology, ENT, hand surgery.   She was in rehabilitation inpatient from 04/20/2016 through 05/01/2016.   She had a carotid Doppler ultrasound on 10/13/2016 which showed less than 50% stenosis of bilateral ICAs.   She had a follow-up head CT on 04/17/2016 which I reviewed: IMPRESSION: Stable small volume subarachnoid hemorrhage in the parafalcine right frontal lobe and left quadrigeminal plate cistern.   No new hemorrhage or acute abnormality.  No hydrocephalus.   I first met her on 06/06/2014, at which time she reported a long-standing history of balance issues and memory issues. Symptoms were progressive. She had fallen in the past and had physical therapy when she had had her knee replacement surgery. She had missed some recent B12 injections per husband's report.   06/06/2014: She reports a longstanding history of balance issues and memory issues. She feels her short-term memory has suffered. She feels her condition has progressed. She does report having fallen in the past. She had physical therapy in the past when she had her knee replacement surgery, but not more recently. Her husband notes that she has missed a couple of B12 injection appointments as I understand.   She had a CT head with without contrast on 08/19/2012: No acute intracranial abnormality. Stable mild atrophy and chronic microvascular change.   She had her lumbar spine MRI without contrast on 07/24/2009: 1. Little interval change from prior exam with slight progression of left foraminal stenosis at L3-L4 compared to the prior exam of 2009. 2. Laminectomy and posterior decompression extending from L3-L4 through L5-S1. No recurrent central or lateral recess stenosis. Foraminal stenosis as described above. 3. In this patient with right radicular symptoms, question right L4 radiculitis associated with compression in the right L4-L5 neural foramen. She had a brain MRI with and without contrast and a brain MRA without contrast  on 03/16/2002 ordered by Dr. Erling Cruz with the indication of confusion, dizziness, syncope and fainting: These were reported as normal.   She had previously seen Dr. Erling Cruz in our office, last in 2009. I reviewed the office note from 07/26/2008, at which time she presented for balance problems and falls. In July and August 2008 she underwent neuropsychological evaluation for complaints of memory problems and test results were in keeping with normal intellectual functioning. Dr. Erling Cruz started her on aspirin. An MMSE score was 30 out of 30 at the time. She had seen Dr. Erling Cruz back in June 2008 for balance problems and falls. Her MMSE was 30 out of 30 at the time, clock drawing was 4 out of 4. He did not notice any significant gait disorder at the time and suspected that in view of her history of gastric bypass, peripheral neuropathy should be suspected and he ordered an EMG and nerve conduction test as well as EEG. He check TSH, RPR and vitamin B12 level as well as methylmalonic acid. She had an EMG and nerve conduction test on 02/10/2007 which I reviewed. This was done by Dr. 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 She had an EEG on 02/10/2007 which was interpreted as normal EEG during the awake and drowsy states. MRI of the cervical spine from 03/17/2007 showed prominent spondylolytic changes throughout most noticeable at C5-6 and C6-7 with mild canal stenosis at C5-6 with no significant foraminal stenosis, root encroachment or cord signal abnormality.   Her Past Medical History Is Significant For: Past Medical History:  Diagnosis Date   Achalasia 08/09/2013   Acne rosacea    Anemia    s/p gastric bypass, malabsorption   Arthritis    B12 deficiency    Bipolar II disorder ([place]) 04/28/2018   Chronic fatigue fibromyalgia syndrome    DDD (degenerative disc disease), cervical  06/29/2017   DDD (degenerative disc disease), lumbar 06/29/2017   s/p laminectomy    Dysphagia    due to esophageal narrowing, able to take meds with thin liquid sif necessary    Esophageal reflux 03/21/2012   Fibromyalgia    Frequent falls    last fall was 03-09-2019 , my knee gave out. other times i feel like its vertigo . like a spacey feeling " . denies loc or loss of time , did not hit head    GAD (generalized anxiety disorder) 07/10/2018   GERD (gastroesophageal reflux disease)    Graves disease    H/O hiatal hernia    HTN (hypertension)    was briefly on losartan back early 2020 , had a severe reaction and was hospitalized , losartan was dc'd and she report shas not been prescribed any new bp meds as of yet, denies lasting issues from hospitalization    Hypothyroidism    IBS (irritable bowel syndrome)    resolved with use of oxymorhpne    Lymphedema    Major depressive disorder    Meralgia paraesthetica    Sjogren's disease ([place]) 06/29/2017   Status post dilation of esophageal narrowing    Stricture and stenosis of esophagus 03/30/2012   Traumatic subarachnoid bleed with LOC of 1 hour to 5 hours 59 minutes ([place]) 04/16/2016    Her Past Surgical History Is Significant For: Past Surgical History:  Procedure Laterality Date   ABDOMINAL HYSTERECTOMY     APPENDECTOMY     AUGMENTATION MAMMAPLASTY Bilateral    in her 89's pt had a reaction to her implants, she had them removed   BALLOON DILATION  03/30/2012   Procedure: BALLOON DILATION;  Surgeon: Inda Castle, MD;  Location: [facility];  Service: Endoscopy;  Laterality: N/A;   BOTOX INJECTION N/A 12/26/2014   Procedure: BOTOX INJECTION;  Surgeon: Inda Castle, MD;  Location: [facility];  Service: Endoscopy;  Laterality: N/A;   BOTOX INJECTION  11/12/2020   Procedure: BOTOX INJECTION;  Surgeon: Wilford Corner, MD;  Location: [facility];  Service: Endoscopy;;   ENDOMETRIAL ABLATION     x 4   ESOPHAGEAL MANOMETRY N/A  05/15/2013   Procedure: ESOPHAGEAL MANOMETRY (EM);  Surgeon: Inda Castle, MD;  Location: [facility];  Service: Endoscopy;  Laterality: N/A;   ESOPHAGOGASTRODUODENOSCOPY  03/30/2012   Procedure: ESOPHAGOGASTRODUODENOSCOPY (EGD);  Surgeon: Inda Castle, MD;  Location: [facility];  Service: Endoscopy;  Laterality: N/A;   ESOPHAGOGASTRODUODENOSCOPY  05/04/2012   Procedure: ESOPHAGOGASTRODUODENOSCOPY (EGD);  Surgeon: Inda Castle, MD;  Location: [facility];  Service: Endoscopy;  Laterality: N/A;   ESOPHAGOGASTRODUODENOSCOPY N/A 12/26/2014   Procedure: ESOPHAGOGASTRODUODENOSCOPY (EGD);  Surgeon: Inda Castle, MD;  Location: [facility];  Service: Endoscopy;  Laterality: N/A;  botox injection   ESOPHAGOGASTRODUODENOSCOPY (EGD) WITH PROPOFOL N/A 11/12/2020   Procedure: ESOPHAGOGASTRODUODENOSCOPY (EGD) WITH PROPOFOL;  Surgeon: Wilford Corner, MD;  Location: [facility];  Service: Endoscopy;  Laterality: N/A;   GASTRIC BYPASS     JOINT REPLACEMENT     right knee    KNEE ARTHROPLASTY     LAMINECTOMY     L3-L5   OPEN REDUCTION INTERNAL FIXATION (ORIF) DISTAL RADIAL FRACTURE Right 04/23/2016   Procedure: OPEN REDUCTION INTERNAL FIXATION (ORIF)  RIGHT DISTAL RADIAL FRACTURE;  Surgeon: Leanora Cover, MD;  Location: [facility];  Service: Orthopedics;  Laterality: Right;   ORIF TRIPOD FRACTURE Right 04/19/2016   Procedure: OPEN REDUCTION INTERNAL FIXATION (ORIF) TRIPOD FRACTURE LIEBINGER MIDFACE NO DRILL;  Surgeon: Leta Baptist, MD;  Location: [facility];  Service: ENT;  Laterality: Right;   REPLACEMENT TOTAL KNEE  01/2011   right   THYROIDECTOMY     TOTAL KNEE ARTHROPLASTY Left 03/13/2019   Procedure: TOTAL KNEE ARTHROPLASTY;  Surgeon: Vickey Huger, MD;  Location: [facility];  Service: Orthopedics;  Laterality: Left;    Her Family History Is Significant For: Family History  Problem Relation Age of Onset   Prostate cancer Maternal Grandfather    Lymphoma Maternal Uncle    Heart disease Father    Heart  disease Sister    Irritable bowel syndrome Sister    Dementia Mother    Stroke Maternal Grandmother    Appendicitis Paternal Grandmother    Colon cancer Neg Hx     Her Social History Is Significant For: Social History   Socioeconomic History   Marital status: Married    Spouse name: Not on file   Number of children: 0   Years of education: 18   Highest education level: Master's degree (e.g., MA, MS, MEng, MEd, MSW, MBA)  Occupational History   Occupation: housewife  Tobacco Use   Smoking status: Never   Smokeless tobacco: Never  Vaping Use   Vaping Use: Never used  Substance and Sexual Activity   Alcohol use: No    Alcohol/week: 0.0 standard drinks   Drug use: No   Sexual activity: Not on file  Other Topics Concern   Not on file  Social History Narrative   Right handed, Married, Step kids 2, Caffeine 4-5 cups daily, graduate school   Rare caffeine use    Social Determinants of Health   Financial Resource Strain: Not on file  Food Insecurity: Not on file  Transportation Needs: Not on file  Physical Activity: Not on file  Stress: Not on file  Social Connections: Not on file    Her Allergies Are:  Allergies  Allergen Reactions   Losartan Rash   Actonel [Risedronate Sodium]     GI Upset   Ambien [Zolpidem Tartrate] Other (See Comments)    Sleep-walking, hallucinations   Depakote [Divalproex Sodium]     Hallucinations   Inderal [Propranolol] Other (See Comments)    Hallucinations    Nsaids Other (See Comments)    Pt. Had gastric bypass surgery    Penicillins Other (See Comments)    GI upset. Did it involve swelling of the face/tongue/throat, SOB, or low BP? No Did it involve sudden or severe rash/hives, skin peeling, or any reaction on the inside of your mouth or nose? No Did you need to seek medical attention at a hospital or doctor's office? No When did it last happen?    As a child If all above answers are "  NO", may proceed with cephalosporin use.      Silicon Nausea And Vomiting    Thyroid storm   Adhesive [Tape] Itching and Rash    Please use "paper" tape   Cefuroxime Axetil Rash   Latex Swelling and Rash    Redness lips   Nickel Itching and Rash    Contact Dermatitis (intolerance)  :   Her Current Medications Are:  Outpatient Encounter Medications as of 03/06/2021  Medication Sig   ALPRAZolam (XANAX) 0.5 MG tablet Take 0.5 mg by mouth 2 (two) times daily as needed for anxiety.   amLODipine (NORVASC) 2.5 MG tablet Take 2.5 mg by mouth daily.   carboxymethylcellulose (REFRESH PLUS) 0.5 % SOLN Place 1 drop into both eyes 3 (three) times daily as needed (dry eyes).   DULoxetine (CYMBALTA) 60 MG capsule TAKE 2 CAPSULES (120 MG TOTAL) BY MOUTH EVERY EVENING.   ferrous sulfate 325 (65 FE) MG tablet Take 325 mg by mouth daily with breakfast.   lamoTRIgine (LAMICTAL) 100 MG tablet TAKE 3 TABLETS (300 MG TOTAL) BY MOUTH AT BEDTIME.   levothyroxine (SYNTHROID) 125 MCG tablet Take 125 mcg by mouth daily before breakfast.   LORazepam (ATIVAN) 1 MG tablet Take 1 mg by mouth at bedtime.   meclizine (ANTIVERT) 12.5 MG tablet Take 12.5 mg by mouth 3 (three) times daily as needed for dizziness.   omeprazole (PRILOSEC) 40 MG capsule Take 40 mg by mouth in the morning and at bedtime.   oxymetazoline (VICKS SINEX) 0.05 % nasal spray Place 1 spray into both nostrils 2 (two) times daily as needed for congestion.   oxymorphone (OPANA) 5 MG tablet Take 5 mg by mouth every 6 (six) hours as needed for pain.   Probiotic Product (PROBIOTIC DAILY PO) Take 2 capsules by mouth daily.   rOPINIRole (REQUIP) 1 MG tablet Take 1 mg by mouth at bedtime as needed (restless leg syndrome).    No facility-administered encounter medications on file as of 03/06/2021.  :  Review of Systems:  Out of a complete 14 point review of systems, all are reviewed and negative with the exception of these symptoms as listed below:  Review of Systems  Neurological:        Here for 1  year f/u on memory loss-    Objective:  Neurological Exam  Physical Exam Physical Examination:   Vitals:   03/06/21 1408  BP: (!) 157/88  Pulse: 75    General Examination: The patient is a very pleasant 75 y.o. female in no acute distress. She appears well-developed and well-nourished and well groomed.  Good spirits, interactive.  HEENT: Normocephalic, atraumatic, pupils are equal, round and reactive to light, extraocular tracking is well preserved, no obvious nystagmus, hearing is perhaps mildly impaired,, but appears stable.  No facial masking noted. Speech is clear, no dysarthria or hypophonia or voice tremor.  Neck is supple, unremarkable scar anterior neck. There are no carotid bruits on auscultation.  Moderate mouth dryness noted.   Chest: Clear to auscultation without wheezing, rhonchi or crackles noted.   Heart: S1+S2+0, regular and normal without murmurs, rubs or gallops noted.   Abdomen: Soft, non-tender and non-distended.   Extremities: There is nonpitting puffiness of her distal legs bilaterally, stable.   Skin: Warm and dry without trophic changes noted.   Musculoskeletal: exam reveals no obvious joint deformities, tenderness or joint swelling or erythema.   Neurologically: Mental status: The patient is awake, alert and oriented in all 4 spheres. Her immediate and  remote memory, attention, language skills and fund of knowledge are mildly impaired, she is able to provide her own history well today.    On 06/06/14: Her MMSE is 28/30.   On 03/09/2016: MMSE: 29/30, CDT: 4/4, AFT: 13/min.    On 02/16/2019: MMSE: 27/30, CDT: 4/4, AFT: 14/min.   On 09/06/2019: MMSE: 27/30, CDT: 4/4, AFT: 12/min.   On 03/05/2020: MMSE: 29/30, CDT: 4/4, AFT: 12/min.  On 03/06/2021: MMSE: 28/30, CDT: 4/4, AFT: 10/min.    There is no significant slowness in thinking today. Speech shows no hypophonia with normal prosody and enunciation. Thought process is linear. Mood is normal, affect  normal, good spirits.   Cranial nerves II - XII are as described above under HEENT exam. Motor exam: Normal bulk, strength and tone is noted. There is no tremor. Romberg is not tested for safety reasons. Fine motor skills and coordination: are grossly intact. Cerebellar testing: No dysmetria or intention tremor on finger. Sensory exam: intact to light touch.  She walks without a walking aid. Assessment and Plan:    In summary, RAEANNA SOBERANES is a 75 year old female with an underlying complex medical history of IBS (irritable bowel syndrome), hypothyroidism, chronic fatigue, FMS, meralgia paraesthetica, chronic low back pain, followed by pain management, on narcotic pain medications, anemia and B12 deficiency, secondary to gastric bypass surgery with malabsorption history, eczema, rosacea, anxiety, arthritis, depression, dysphagia, secondary to esophageal stricture (status post dilation of esophageal narrowing), GERD, stroke, Sjogren's syndrome, Graves disease and lymphedema, who presents for follow-up consultation of her memory loss. Her MMSE scores have been fairly stable, slight decline noted compared to last year but clinically she appears to be stable and feels stable. She was hospitalization in 2020 for encephalopathy. She had evaluation with neuropsychology in 2020. Findings and test results from her neuropsychological evaluation are supportive of mild cognitive impairment. She had previously seen Dr. Erling Cruz in the past, had an MRI brain in 2003 with unremarkable findings, had memory testing with formal cognitive testing in 2008 with normal results at the time, and previous extensive blood work as well, and EMG and nerve conduction testing.  She continues to take narcotic pain medication and is on multiple psychotropic medications.  She has significant difficulty sleeping at night.  She also takes lorazepam to help her sleep. We previously did a brain MRI and EEG in 2017. She had a brain MRI on 04/01/2019  without contrast which showed moderate generalized cortical atrophy that is most pronounced in the mesial temporal lobes.  The atrophy was progressed compared to 2017.  She had microvascular ischemic changes with mild progression noted compared to 2017.  She was previously advised to proceed with a sleep study but declined it at the time.  At this juncture, we will continue to monitor her memory and plan for a 1 year follow-up.  I would like to refer her back to Dr. Hazle Coca for a reevaluation with cognitive testing as an 18 to 72-monthrecheck was recommended at the time.  She is agreeable to referral.  We will plan a follow-up after.  She is advised to continue to pursue healthy lifestyle.  She has been working with her new psychiatrist to reduce some of her medication, has not been using her ropinirole daily and has reduced her Lamictal. I answered all their questions today and the patient and her husband were in agreement.  I spent 30 minutes in total face-to-face time and in reviewing records during pre-charting, more than 50% of  which was spent in counseling and coordination of care, reviewing test results, reviewing medications and treatment regimen and/or in discussing or reviewing the diagnosis of memory loss, the prognosis and treatment options. Pertinent laboratory and imaging test results that were available during this visit with the patient were reviewed by me and considered in my medical decision making (see chart for details).

## 2021-03-07 ENCOUNTER — Encounter (HOSPITAL_COMMUNITY): Payer: Self-pay | Admitting: Emergency Medicine

## 2021-03-07 ENCOUNTER — Emergency Department (HOSPITAL_COMMUNITY)
Admission: EM | Admit: 2021-03-07 | Discharge: 2021-03-07 | Disposition: A | Discharge status: Home / Self Care | Payer: Medicare Other | Attending: Emergency Medicine | Admitting: Emergency Medicine

## 2021-03-07 ENCOUNTER — Emergency Department (HOSPITAL_COMMUNITY): Payer: Medicare Other

## 2021-03-07 ENCOUNTER — Other Ambulatory Visit: Payer: Self-pay

## 2021-03-07 DIAGNOSIS — E039 Hypothyroidism, unspecified: Secondary | ICD-10-CM | POA: Insufficient documentation

## 2021-03-07 DIAGNOSIS — Z91040 Latex allergy status: Secondary | ICD-10-CM | POA: Insufficient documentation

## 2021-03-07 DIAGNOSIS — Z96653 Presence of artificial knee joint, bilateral: Secondary | ICD-10-CM | POA: Diagnosis not present

## 2021-03-07 DIAGNOSIS — S0083XA Contusion of other part of head, initial encounter: Secondary | ICD-10-CM | POA: Insufficient documentation

## 2021-03-07 DIAGNOSIS — I10 Essential (primary) hypertension: Secondary | ICD-10-CM | POA: Diagnosis not present

## 2021-03-07 DIAGNOSIS — W01198A Fall on same level from slipping, tripping and stumbling with subsequent striking against other object, initial encounter: Secondary | ICD-10-CM | POA: Insufficient documentation

## 2021-03-07 DIAGNOSIS — S0990XA Unspecified injury of head, initial encounter: Secondary | ICD-10-CM | POA: Diagnosis present

## 2021-03-07 DIAGNOSIS — K08530 Fractured dental restorative material without loss of material: Secondary | ICD-10-CM | POA: Insufficient documentation

## 2021-03-07 DIAGNOSIS — M2763 Post-osseointegration mechanical failure of dental implant: Secondary | ICD-10-CM

## 2021-03-07 DIAGNOSIS — Z79899 Other long term (current) drug therapy: Secondary | ICD-10-CM | POA: Insufficient documentation

## 2021-03-07 DIAGNOSIS — M25561 Pain in right knee: Secondary | ICD-10-CM | POA: Diagnosis not present

## 2021-03-07 MED ORDER — MORPHINE SULFATE (PF) 4 MG/ML IV SOLN
4.0000 mg | Freq: Once | INTRAVENOUS | Status: AC
  Administered 2021-03-07: 4 mg via INTRAMUSCULAR
  Filled 2021-03-07: qty 1

## 2021-03-07 NOTE — ED Triage Notes (Signed)
Pt reports tripping and falling at lunch and hit her face on the curb. Pt reports she broke her denture implant. Pt denies blood thinners. Pt has chipped tooth and abrasion to chin and palms. Pain to rt knee with bruising. Reports headache and increased neck and back pain which is chronic at baseline. Denies LOC. Unknown last tetanus.

## 2021-03-07 NOTE — ED Provider Notes (Signed)
[facility] EMERGENCY DEPARTMENT Provider Note   CSN: [id number] Arrival date & time: 03/07/21  1335     History Chief Complaint  Patient presents with   Jillian Morris    RAIVYN Morris is a 75 y.o. female.  Pt complains of mechanic fall.  Fall occurred at approximately 12:30 this afternoon.  Patient states that she tripped over a curb and hit her face on the ground.  Patientdenies any Loc.  Is ot on any blood thinners.  Patient claims a neck pain and back pain times at baseline.  This pain is unchanged after fall.  Patient endorses damage to front dental implant and superficial abrasion to chin and lower lip.  Patient also complains of pain to right knee.  Patient endorses bruising.       Past Medical History:  Diagnosis Date   Achalasia 08/09/2013   Acne rosacea    Anemia    s/p gastric bypass, malabsorption   Arthritis    B12 deficiency    Bipolar II disorder (HCC) 04/28/2018   Chronic fatigue fibromyalgia syndrome    DDD (degenerative disc disease), cervical 06/29/2017   DDD (degenerative disc disease), lumbar 06/29/2017   s/p laminectomy    Dysphagia    due to esophageal narrowing, able to take meds with thin liquid sif necessary    Esophageal reflux 03/21/2012   Fibromyalgia    Frequent falls    last fall was 03-09-2019 , my knee gave out. other times i feel like its vertigo . like a spacey feeling " . denies loc or loss of time , did not hit head    GAD (generalized anxiety disorder) 07/10/2018   GERD (gastroesophageal reflux disease)    Graves disease    H/O hiatal hernia    HTN (hypertension)    was briefly on losartan back early 2020 , had a severe reaction and was hospitalized , losartan was dc'd and she report shas not been prescribed any new bp meds as of yet, denies lasting issues from hospitalization    Hypothyroidism    IBS (irritable bowel syndrome)    resolved with use of oxymorhpne    Lymphedema    Major depressive disorder    Meralgia  paraesthetica    Sjogren's disease (HCC) 06/29/2017   Status post dilation of esophageal narrowing    Stricture and stenosis of esophagus 03/30/2012   Traumatic subarachnoid bleed with LOC of 1 hour to 5 hours 59 minutes (HCC) 04/16/2016    Patient Active Problem List   Diagnosis Date Noted   Status post dilation of esophageal narrowing    Meralgia paraesthetica    Lymphedema    IBS (irritable bowel syndrome)    Hypothyroidism    H/O hiatal hernia    Graves disease    GERD (gastroesophageal reflux disease)    HTN (hypertension)    Frequent falls    Dysphagia    B12 deficiency    Arthritis    Anemia    Acne rosacea    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   Altered mental status 12/06/2018   GAD (generalized anxiety disorder) 07/10/2018   Insomnia 07/10/2018   Bipolar II disorder (HCC) 04/28/2018   Sjogren's disease (HCC) 06/29/2017   DDD (degenerative disc disease), cervical 06/29/2017   DDD (degenerative disc disease), lumbar 06/29/2017   Primary osteoarthritis of left knee 06/29/2017   History of total knee replacement, right 06/29/2017   Age-related osteoporosis without current pathological fracture 06/29/2017   Essential tremor 06/29/2017  History of TIA (transient ischemic attack) 06/29/2017   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   Fall 04/20/2016   Multiple facial fractures (HCC) 04/20/2016   Right wrist fracture 04/20/2016   Closed fracture of right distal radius    Closed fracture of zygomatic arch (HCC)    Major depressive disorder    Fibromyalgia    Traumatic subarachnoid bleed with LOC of 1 hour to 5 hours 59 minutes (HCC) 04/16/2016   Achalasia 08/09/2013   Stricture and stenosis of esophagus 03/30/2012   Dysphagia, unspecified(787.20) 03/21/2012   Esophageal reflux 03/21/2012    Past Surgical History:  Procedure Laterality Date   ABDOMINAL HYSTERECTOMY     APPENDECTOMY     AUGMENTATION MAMMAPLASTY Bilateral    in her 40's pt had a reaction to her implants,  she had them removed   BALLOON DILATION  03/30/2012   Procedure: BALLOON DILATION;  Surgeon: Louis Meckel, MD;  Location: [facility];  Service: Endoscopy;  Laterality: N/A;   BOTOX INJECTION N/A 12/26/2014   Procedure: BOTOX INJECTION;  Surgeon: Louis Meckel, MD;  Location: [facility];  Service: Endoscopy;  Laterality: N/A;   BOTOX INJECTION  11/12/2020   Procedure: BOTOX INJECTION;  Surgeon: Charlott Rakes, MD;  Location: [facility];  Service: Endoscopy;;   ENDOMETRIAL ABLATION     x 4   ESOPHAGEAL MANOMETRY N/A 05/15/2013   Procedure: ESOPHAGEAL MANOMETRY (EM);  Surgeon: Louis Meckel, MD;  Location: [facility];  Service: Endoscopy;  Laterality: N/A;   ESOPHAGOGASTRODUODENOSCOPY  03/30/2012   Procedure: ESOPHAGOGASTRODUODENOSCOPY (EGD);  Surgeon: Louis Meckel, MD;  Location: [facility];  Service: Endoscopy;  Laterality: N/A;   ESOPHAGOGASTRODUODENOSCOPY  05/04/2012   Procedure: ESOPHAGOGASTRODUODENOSCOPY (EGD);  Surgeon: Louis Meckel, MD;  Location: [facility];  Service: Endoscopy;  Laterality: N/A;   ESOPHAGOGASTRODUODENOSCOPY N/A 12/26/2014   Procedure: ESOPHAGOGASTRODUODENOSCOPY (EGD);  Surgeon: Louis Meckel, MD;  Location: [facility];  Service: Endoscopy;  Laterality: N/A;  botox injection   ESOPHAGOGASTRODUODENOSCOPY (EGD) WITH PROPOFOL N/A 11/12/2020   Procedure: ESOPHAGOGASTRODUODENOSCOPY (EGD) WITH PROPOFOL;  Surgeon: Charlott Rakes, MD;  Location: [facility];  Service: Endoscopy;  Laterality: N/A;   GASTRIC BYPASS     JOINT REPLACEMENT     right knee    KNEE ARTHROPLASTY     LAMINECTOMY     L3-L5   OPEN REDUCTION INTERNAL FIXATION (ORIF) DISTAL RADIAL FRACTURE Right 04/23/2016   Procedure: OPEN REDUCTION INTERNAL FIXATION (ORIF)  RIGHT DISTAL RADIAL FRACTURE;  Surgeon: Betha Loa, MD;  Location: [facility];  Service: Orthopedics;  Laterality: Right;   ORIF TRIPOD FRACTURE Right 04/19/2016   Procedure: OPEN REDUCTION INTERNAL FIXATION (ORIF) TRIPOD FRACTURE  LIEBINGER MIDFACE NO DRILL;  Surgeon: Newman Pies, MD;  Location: [facility];  Service: ENT;  Laterality: Right;   REPLACEMENT TOTAL KNEE  01/2011   right   THYROIDECTOMY     TOTAL KNEE ARTHROPLASTY Left 03/13/2019   Procedure: TOTAL KNEE ARTHROPLASTY;  Surgeon: Dannielle Huh, MD;  Location: [facility];  Service: Orthopedics;  Laterality: Left;     OB History   No obstetric history on file.     Family History  Problem Relation Age of Onset   Prostate cancer Maternal Grandfather    Lymphoma Maternal Uncle    Heart disease Father    Heart disease Sister    Irritable bowel syndrome Sister    Dementia Mother    Stroke Maternal Grandmother    Appendicitis Paternal Grandmother    Colon cancer Neg Hx  Social History   Tobacco Use   Smoking status: Never   Smokeless tobacco: Never  Vaping Use   Vaping Use: Never used  Substance Use Topics   Alcohol use: No    Alcohol/week: 0.0 standard drinks   Drug use: No    Home Medications Prior to Admission medications   Medication Sig Start Date End Date Taking? Authorizing Provider  ALPRAZolam Prudy Feeler) 0.5 MG tablet Take 0.5 mg by mouth 2 (two) times daily as needed for anxiety.    [provider]  amLODipine (NORVASC) 2.5 MG tablet Take 2.5 mg by mouth daily. 05/05/19   [provider]  carboxymethylcellulose (REFRESH PLUS) 0.5 % SOLN Place 1 drop into both eyes 3 (three) times daily as needed (dry eyes).    [provider]  DULoxetine (CYMBALTA) 60 MG capsule TAKE 2 CAPSULES (120 MG TOTAL) BY MOUTH EVERY EVENING. 07/17/19   Cottle, Steva Ready., MD  ferrous sulfate 325 (65 FE) MG tablet Take 325 mg by mouth daily with breakfast.    [provider]  lamoTRIgine (LAMICTAL) 100 MG tablet TAKE 3 TABLETS (300 MG TOTAL) BY MOUTH AT BEDTIME. 07/06/19   Cottle, Steva Ready., MD  levothyroxine (SYNTHROID) 125 MCG tablet Take 125 mcg by mouth daily before breakfast.    [provider]  LORazepam (ATIVAN) 1 MG tablet  Take 1 mg by mouth at bedtime.    [provider]  meclizine (ANTIVERT) 12.5 MG tablet Take 12.5 mg by mouth 3 (three) times daily as needed for dizziness.    [provider]  omeprazole (PRILOSEC) 40 MG capsule Take 40 mg by mouth in the morning and at bedtime. 09/13/20   [provider]  oxymetazoline (VICKS SINEX) 0.05 % nasal spray Place 1 spray into both nostrils 2 (two) times daily as needed for congestion.    [provider]  oxymorphone (OPANA) 5 MG tablet Take 5 mg by mouth every 6 (six) hours as needed for pain. 05/05/19   [provider]  Probiotic Product (PROBIOTIC DAILY PO) Take 2 capsules by mouth daily.    [provider]  rOPINIRole (REQUIP) 1 MG tablet Take 1 mg by mouth at bedtime as needed (restless leg syndrome).  02/22/16   [provider]    Allergies    Losartan, Actonel [risedronate sodium], Ambien [zolpidem tartrate], Depakote [divalproex sodium], Inderal [propranolol], Nsaids, Penicillins, Silicon, Adhesive [tape], Cefuroxime axetil, Latex, and Nickel  Review of Systems   Review of Systems  Constitutional:  Negative for fever.  HENT:  Negative for ear pain.   Eyes:  Negative for pain.  Respiratory:  Negative for cough.   Cardiovascular:  Negative for chest pain.  Gastrointestinal:  Negative for abdominal pain.  Genitourinary:  Negative for flank pain.  Musculoskeletal:  Positive for back pain.  Skin:  Negative for rash.  Neurological:  Positive for headaches.   Physical Exam Updated Vital Signs BP 125/66 (BP Location: Right Arm)   Pulse 71   Temp 98.2 F (36.8 C)   Resp 18   Ht  (1.626 m)   Wt 79.8 kg   SpO2 100%   BMI 30.21 kg/m   Physical Exam Constitutional:      General: She is not in acute distress.    Appearance: Normal appearance.  HENT:     Head: Normocephalic.     Nose: Nose normal.     Mouth/Throat:     Comments: Upper dental implant right side appears chipped. Eyes:  Extraocular Movements: Extraocular movements intact.  Cardiovascular:     Rate and Rhythm: Normal rate.  Pulmonary:     Effort: Pulmonary effort is normal.  Musculoskeletal:        General: Normal range of motion.     Cervical back: Normal range of motion.  Skin:    Comments: Laceration to the right side of lower lip.  Superficial laceration to the chin.  Neurological:     General: No focal deficit present.     Mental Status: She is alert. Mental status is at baseline.    ED Results / Procedures / Treatments   Labs (all labs ordered are listed, but only abnormal results are displayed) Labs Reviewed - No data to display  EKG None  Radiology DG Lumbar Spine Complete  Result Date: 03/07/2021 CLINICAL DATA:  Larey Seat, pain EXAM: LUMBAR SPINE - COMPLETE 4+ VIEW COMPARISON:  04/01/2012, 02/18/2012 FINDINGS: Frontal, bilateral oblique, lateral views of the lumbar spine are obtained. There are 5 non-rib-bearing lumbar type vertebral bodies identified with right convex scoliosis centered at L2/L3. No acute fracture. There is significant spondylosis and facet hypertrophy at L3-4 and L4-5, with only mild progression since 2013. Stable grade 1 anterolisthesis of L4 on L5. Sacroiliac joints are unremarkable. IMPRESSION: 1. No acute bony abnormality. 2. Mild progression of the severe spondylosis and facet hypertrophy at L3-4 and L4-5 seen on prior examinations in 2013. 3. Stable grade 1 anterolisthesis of L4 on L5. 4. Right convex scoliosis unchanged. Electronically Signed   By: Sharlet Salina M.D.   On: 03/07/2021 15:42   CT Head Wo Contrast  Result Date: 03/07/2021 CLINICAL DATA:  Fall. EXAM: CT HEAD WITHOUT CONTRAST CT CERVICAL SPINE WITHOUT CONTRAST TECHNIQUE: Multidetector CT imaging of the head and cervical spine was performed following the standard protocol without intravenous contrast. Multiplanar CT image reconstructions of the cervical spine were also generated. COMPARISON:  MRI brain dated  April 01, 2019. CT head dated December 06, 2018. MRI cervical spine dated April 20, 2018. FINDINGS: CT HEAD FINDINGS Brain: No evidence of acute infarction, hemorrhage, hydrocephalus, extra-axial collection or mass lesion/mass effect. Stable mild atrophy and chronic microvascular ischemic changes. Vascular: Atherosclerotic vascular calcification of the carotid siphons. No hyperdense vessel. Skull: Normal. Negative for fracture or focal lesion. Sinuses/Orbits: No acute finding. Other: None. CT CERVICAL SPINE FINDINGS Alignment: No traumatic malalignment. Unchanged trace anterolisthesis at C3-C4 and C4-C5. Unchanged mild retrolisthesis at C5-C6 and C6-C7. Skull base and vertebrae: No acute fracture. No primary bone lesion or focal pathologic process. Soft tissues and spinal canal: No prevertebral fluid or swelling. No visible canal hematoma. Disc levels: Severe disc height loss at C5-C6. Moderate disc height loss at C3-C4, C4-C5, and C6-C7. Scattered moderate facet arthropathy. Severe right neuroforaminal stenosis at C5-C6 due to advanced uncovertebral hypertrophy. Findings are overall similar to 2019. Upper chest: Negative. Other: None. IMPRESSION: 1. No acute intracranial abnormality. 2. No acute cervical spine fracture or traumatic listhesis. 3. Multilevel cervical spondylosis as described above. Severe right neuroforaminal stenosis at C5-C6 due to advanced uncovertebral hypertrophy. Electronically Signed   By: Obie Dredge M.D.   On: 03/07/2021 17:08   CT Cervical Spine Wo Contrast  Result Date: 03/07/2021 CLINICAL DATA:  Fall. EXAM: CT HEAD WITHOUT CONTRAST CT CERVICAL SPINE WITHOUT CONTRAST TECHNIQUE: Multidetector CT imaging of the head and cervical spine was performed following the standard protocol without intravenous contrast. Multiplanar CT image reconstructions of the cervical spine were also generated. COMPARISON:  MRI brain dated April 01, 2019.  CT head dated December 06, 2018. MRI cervical spine dated  April 20, 2018. FINDINGS: CT HEAD FINDINGS Brain: No evidence of acute infarction, hemorrhage, hydrocephalus, extra-axial collection or mass lesion/mass effect. Stable mild atrophy and chronic microvascular ischemic changes. Vascular: Atherosclerotic vascular calcification of the carotid siphons. No hyperdense vessel. Skull: Normal. Negative for fracture or focal lesion. Sinuses/Orbits: No acute finding. Other: None. CT CERVICAL SPINE FINDINGS Alignment: No traumatic malalignment. Unchanged trace anterolisthesis at C3-C4 and C4-C5. Unchanged mild retrolisthesis at C5-C6 and C6-C7. Skull base and vertebrae: No acute fracture. No primary bone lesion or focal pathologic process. Soft tissues and spinal canal: No prevertebral fluid or swelling. No visible canal hematoma. Disc levels: Severe disc height loss at C5-C6. Moderate disc height loss at C3-C4, C4-C5, and C6-C7. Scattered moderate facet arthropathy. Severe right neuroforaminal stenosis at C5-C6 due to advanced uncovertebral hypertrophy. Findings are overall similar to 2019. Upper chest: Negative. Other: None. IMPRESSION: 1. No acute intracranial abnormality. 2. No acute cervical spine fracture or traumatic listhesis. 3. Multilevel cervical spondylosis as described above. Severe right neuroforaminal stenosis at C5-C6 due to advanced uncovertebral hypertrophy. Electronically Signed   By: Obie DredgeWilliam T Derry M.D.   On: 03/07/2021 17:08   DG Knee Complete 4 Views Right  Result Date: 03/07/2021 CLINICAL DATA:  Fall, right knee pain EXAM: RIGHT KNEE - COMPLETE 4+ VIEW COMPARISON:  03/23/2019 FINDINGS: Postsurgical changes from right total knee arthroplasty. Arthroplasty components are in their expected alignment without periprosthetic lucency or fracture. No joint effusion. Stable benign enchondroma within the distal femur. There is prominent soft tissue swelling over the anterior aspect of the knee. IMPRESSION: 1. No acute osseous abnormality of the right knee. 2.  Prominent soft tissue swelling over the anterior aspect of the knee. Electronically Signed   By: Duanne GuessNicholas  Plundo D.O.   On: 03/07/2021 15:43    Procedures Procedures   Medications Ordered in ED Medications  morphine 4 MG/ML injection 4 mg (has no administration in time range)    ED Course  I have reviewed the triage vital signs and the nursing notes.  Pertinent labs & imaging results that were available during my care of the patient were reviewed by me and considered in my medical decision making (see chart for details).    MDM Rules/Calculators/A&P                          Imaging shows no acute fracture dislocation or foreign body.  Patient advised outpatient follow-up with dentistry within the week.  Advised immediate return for worsening pain fevers or any additional concerns.  Final Clinical Impression(s) / ED Diagnoses Final diagnoses:  Contusion of face, initial encounter  Fracture of dental implant    Rx / DC Orders ED Discharge Orders     None        Cheryll CockayneHong, Abdiel Blackerby S, MD 03/07/21 1906

## 2021-03-07 NOTE — ED Provider Notes (Signed)
Emergency Medicine Provider Triage Evaluation Note  Jillian Morris , a 75 y.o. female  was evaluated in triage.  Pt complains of mechanic fall.  Fall occurred at approximately 12:30 this afternoon.  Patient states that she tripped over a curb and hit her face on the ground.  Patientdenies any Loc.  Is ot on any blood thinners.  Patient claims a neck pain and back pain times at baseline.  This pain is unchanged after fall.  Patient endorses damage to front dental implant and superficial abrasion to chin and lower lip.  Patient also complains of pain to right knee.  Patient endorses bruising.  Unsure when her last tetanus shot.  Denies any numbness, weakness, visual disturbance, saddle anesthesia.  Review of Systems  Positive: Fall, myalgia, Negative: Numbness, weakness, visual disturbance, saddle anesthesia, nausea, vomiting  Physical Exam  BP 127/78   Pulse 73   Temp 98.2 F (36.8 C)   Resp 16   SpO2 98%  Gen:   Awake, no distress   Resp:  Normal effort  MSK:   Moves extremities without difficulty, ecchymosis noted distal to right knee, tenderness below right knee.,  ROM to right knee intact. Other:  No midline tenderness or deformity to cervical, thoracic, or lumbar spine.  Superficial abrasion to chin.  Laceration to Lower lip.  Dental injury without dental tenderness.  Patient able to handle oral secretions without difficulty.    Medical Decision Making  Medically screening exam initiated at 2:24 PM.  Appropriate orders placed.  [name] was informed that the remainder of the evaluation will be completed by another provider, this initial triage assessment does not replace that evaluation, and the importance of remaining in the ED until their evaluation is complete.  The patient appears stable so that the remainder of the work up may be completed by another provider.      Haskel Schroeder, PA-C 03/07/21 1429    Melene Plan, DO 03/07/21 1624

## 2021-03-07 NOTE — ED Notes (Signed)
Pt has bruise to R knee and chipped tooth in R side of upper mouth. Pt has ice pack.

## 2021-03-07 NOTE — Discharge Instructions (Addendum)
Call your primary care doctor or specialist as discussed in the next 2-3 days.   Return immediately back to the ER if:  Your symptoms worsen within the next 12-24 hours. You develop new symptoms such as new fevers, persistent vomiting, new pain, shortness of breath, or new weakness or numbness, or if you have any other concerns.  

## 2021-03-26 ENCOUNTER — Ambulatory Visit (HOSPITAL_BASED_OUTPATIENT_CLINIC_OR_DEPARTMENT_OTHER): Payer: Medicare Other

## 2021-03-28 ENCOUNTER — Ambulatory Visit (HOSPITAL_BASED_OUTPATIENT_CLINIC_OR_DEPARTMENT_OTHER): Payer: Medicare Other

## 2021-03-31 ENCOUNTER — Encounter: Payer: Self-pay | Admitting: Psychology

## 2021-04-07 ENCOUNTER — Ambulatory Visit: Payer: Medicare Other | Admitting: Cardiology

## 2021-04-23 ENCOUNTER — Other Ambulatory Visit: Payer: Self-pay | Admitting: Family Medicine

## 2021-04-23 ENCOUNTER — Ambulatory Visit (HOSPITAL_COMMUNITY): Payer: Medicare Other

## 2021-04-23 DIAGNOSIS — Z1231 Encounter for screening mammogram for malignant neoplasm of breast: Secondary | ICD-10-CM

## 2021-05-01 ENCOUNTER — Ambulatory Visit: Payer: Medicare Other

## 2021-05-02 ENCOUNTER — Ambulatory Visit (HOSPITAL_COMMUNITY): Payer: Medicare Other | Attending: Internal Medicine

## 2021-06-10 ENCOUNTER — Ambulatory Visit: Payer: Medicare Other | Admitting: Cardiology

## 2021-08-12 ENCOUNTER — Other Ambulatory Visit: Payer: Self-pay

## 2021-08-12 ENCOUNTER — Encounter: Payer: Self-pay | Admitting: Psychology

## 2021-08-12 ENCOUNTER — Ambulatory Visit (INDEPENDENT_AMBULATORY_CARE_PROVIDER_SITE_OTHER): Payer: Medicare Other | Admitting: Psychology

## 2021-08-12 DIAGNOSIS — F3181 Bipolar II disorder: Secondary | ICD-10-CM

## 2021-08-12 DIAGNOSIS — F411 Generalized anxiety disorder: Secondary | ICD-10-CM

## 2021-08-12 DIAGNOSIS — J309 Allergic rhinitis, unspecified: Secondary | ICD-10-CM | POA: Insufficient documentation

## 2021-08-12 DIAGNOSIS — E7800 Pure hypercholesterolemia, unspecified: Secondary | ICD-10-CM | POA: Insufficient documentation

## 2021-08-12 DIAGNOSIS — E2839 Other primary ovarian failure: Secondary | ICD-10-CM | POA: Insufficient documentation

## 2021-08-12 DIAGNOSIS — R1313 Dysphagia, pharyngeal phase: Secondary | ICD-10-CM | POA: Insufficient documentation

## 2021-08-12 DIAGNOSIS — G3184 Mild cognitive impairment, so stated: Secondary | ICD-10-CM | POA: Diagnosis not present

## 2021-08-12 DIAGNOSIS — N1832 Chronic kidney disease, stage 3b: Secondary | ICD-10-CM | POA: Insufficient documentation

## 2021-08-12 DIAGNOSIS — R1084 Generalized abdominal pain: Secondary | ICD-10-CM | POA: Insufficient documentation

## 2021-08-12 DIAGNOSIS — H6060 Unspecified chronic otitis externa, unspecified ear: Secondary | ICD-10-CM | POA: Insufficient documentation

## 2021-08-12 DIAGNOSIS — G43909 Migraine, unspecified, not intractable, without status migrainosus: Secondary | ICD-10-CM | POA: Insufficient documentation

## 2021-08-12 DIAGNOSIS — M797 Fibromyalgia: Secondary | ICD-10-CM

## 2021-08-12 DIAGNOSIS — G2581 Restless legs syndrome: Secondary | ICD-10-CM | POA: Insufficient documentation

## 2021-08-12 DIAGNOSIS — R600 Localized edema: Secondary | ICD-10-CM | POA: Insufficient documentation

## 2021-08-12 DIAGNOSIS — M2550 Pain in unspecified joint: Secondary | ICD-10-CM | POA: Insufficient documentation

## 2021-08-12 DIAGNOSIS — R269 Unspecified abnormalities of gait and mobility: Secondary | ICD-10-CM | POA: Insufficient documentation

## 2021-08-12 NOTE — Progress Notes (Signed)
NEUROPSYCHOLOGICAL EVALUATION [place]  Date of Evaluation: August 12, 2021  Reason for Referral:   Jillian Morris is a 75 y.o. right-handed Caucasian female referred by  Star Age, M.D. 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 .   Assessment and Plan:   Clinical Impression(s): Unfortunately, Jillian Morris abruptly discontinued the current evaluation after only a few minutes of testing with the psychometrist. She repeatedly stated that she "should not be tested" due to her perception that various medications were affecting her cognitive abilities. It was explained to her that these medications would have minimal impact on current testing and that I would take this into account while interpreting results. It was also highlighted that she was under the influence of significant pain medications during her previous evaluation in 2020 and still generally performed well. Unfortunately, she continued to be resistant to testing. It was unclear if these comments were actually based on underlying anxiety and current medication changes were being used as a facade. Testing was ultimately discontinued.   She was perfect on a brief orientation task and had been performing well on a screening instrument (SLUMS; 4/5 recall, 19 animals, appropriate clock) prior to her discontinuation.   Recommendations: If she becomes more amenable in the future, repeating testing would be beneficial. As stated previously, neuroimaging suggesting somewhat progressive medial temporal lobe atrophy is a risk factor for Alzheimer's disease. Repeat testing would be required to assess for any objective decline which had occurred since her previous evaluation.  Review of Records:   Jillian Morris was seen by  her neurologist Star Age, M.D.) on 05/30/2019 for follow-up of memory loss. She had been hospitalized in April 2020 secondary to rash and fever. She was noted to have altered mental status and acute kidney injury. She was advised to discontinue Xanax. Upon discharge her creatinine had improved but was still mildly elevated. Performance on a brief cognitive screening instrument (MMSE) was 27/30 at that time. Overall, she described worsening memory, as well as trouble with balance/coordination. Her husband noted that these seemed to mildly improve after her synthroid was adjusted. Ultimately, she was referred for a comprehensive neuropsychological evaluation to characterize her cognitive abilities and to assist with diagnostic clarity and future treatment planning.   She completed a comprehensive neuropsychological evaluation with myself on 08/03/2019. Results at that time suggested primary deficits surrounding attention/concentration and semantic fluency. Some variability was further noted across processing speed and cognitive flexibility. Memory was quite strong. 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 Scores were potentially caused by acute levels of severe anxiety and severe depression, as well as moderate sleep dysfunction. However, imaging suggesting medial temporal atrophy was said to raise the risk for underlying Alzheimer's disease. Repeat testing in 18-24 months was recommended.   She most recently met with Dr. Rexene Alberts on 03/06/2021 for follow-up. Jillian Morris reported feeling stable at that time. She noted ongoing trouble with attention/concentration and short-term memory, but not any worse than the previous year per her perception. No recent falls were reported. She started seeing a different psychiatrist and was in the process of reducing her Lamictal. She does not take ropinirole daily for restless legs (only as needed). She had been taking  supplements more consistently. Ultimately, Jillian Morris was referred for a repeat neuropsychological evaluation to  characterize her cognitive abilities and to assist with diagnostic clarity and treatment planning.    Brain MRI on 03/16/2002 was unremarkable. Head CT on 01/05/2007 was negative. Head CT on 08/19/2012 revealed stable mild atrophy and chronic microvascular changes. Head CT on 09/03/2014 revealed mild atrophy. Brain MRI on 03/23/2016 revealed T2/FLAIR hyperintense foci in the pons and the hemispheres most consistent with chronic microvascular ischemic change, increased since 2009. Results also revealed moderate cortical atrophy most pronounced in the medial temporal lobes, increased since 2009. Head CT on 04/17/2016 in the context of a fall revealed a small volume subarachnoid hemorrhage in the parafalcine right frontal lobe. Brain MRI on 04/03/2019 revealed progressive small vessel ischemic changes and atrophy in the medial temporal lobes relative to 2017 scans. Head CT on 03/07/2021 was negative.   Past Medical History:  Diagnosis Date   Abnormal gait    Achalasia 08/09/2013   Age-related osteoporosis without current pathological fracture 06/29/2017   Allergic rhinitis    Arthritis    Bipolar II disorder ([place]) 04/28/2018   Chronic kidney disease, stage 3b    Chronic otitis externa    Closed fracture of right distal radius    Closed fracture of zygomatic arch    DDD (degenerative disc disease), cervical 06/29/2017   DDD (degenerative disc disease), lumbar 06/29/2017   s/p laminectomy    Decreased estrogen level    Dysphagia    due to esophageal narrowing, able to take meds with thin liquid sif necessary    Essential hypertension    was briefly on losartan back early 2020 , had a severe reaction and was hospitalized , losartan was dc'd and she report shas not been prescribed any new bp meds as of yet, denies lasting issues from hospitalization    Essential tremor 06/29/2017    Fibromyalgia    Frequent falls    last fall was 03-09-2019 , my knee gave out. other times i feel like its vertigo . like a spacey feeling " . 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   Hypothyroidism    IBS (irritable bowel syndrome)    resolved with use of oxymorhpne    Insomnia 07/10/2018   Iron deficiency anemia    s/p gastric bypass, malabsorption   Joint pain    Lymphedema    Major depressive disorder    Meralgia paraesthetica    Migraine    Mild cognitive impairment of uncertain or unknown etiology 08/03/2019   Pedal edema    Pharyngeal dysphagia    Primary osteoarthritis of left knee 06/29/2017   Pure hypercholesterolemia    Restless legs    Right wrist fracture 04/20/2016   Rosacea    S/P dilation of esophageal narrowing    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   Sjogren's disease 06/29/2017   Stricture and stenosis of esophagus 03/30/2012   Traumatic subarachnoid bleed with LOC of 1 hour to 5 hours 59 minutes 04/16/2016   Vitamin B12 deficiency     Past Surgical History:  Procedure Laterality Date   ABDOMINAL HYSTERECTOMY     APPENDECTOMY     AUGMENTATION MAMMAPLASTY Bilateral    in her 40's pt had a reaction to her implants,  she had them removed   BALLOON DILATION  03/30/2012   Procedure: BALLOON DILATION;  Surgeon: Inda Castle, MD;  Location: [facility];  Service: Endoscopy;  Laterality: N/A;   BOTOX INJECTION N/A 12/26/2014   Procedure: BOTOX INJECTION;  Surgeon: Inda Castle, MD;  Location: [facility];  Service: Endoscopy;  Laterality: N/A;   BOTOX INJECTION  11/12/2020   Procedure: BOTOX INJECTION;  Surgeon: Wilford Corner, MD;  Location: [facility];  Service: Endoscopy;;    ENDOMETRIAL ABLATION     x 4   ESOPHAGEAL MANOMETRY N/A 05/15/2013   Procedure: ESOPHAGEAL MANOMETRY (EM);  Surgeon: Inda Castle, MD;  Location: [facility];  Service: Endoscopy;  Laterality: N/A;   ESOPHAGOGASTRODUODENOSCOPY  03/30/2012   Procedure: ESOPHAGOGASTRODUODENOSCOPY (EGD);  Surgeon: Inda Castle, MD;  Location: [facility];  Service: Endoscopy;  Laterality: N/A;   ESOPHAGOGASTRODUODENOSCOPY  05/04/2012   Procedure: ESOPHAGOGASTRODUODENOSCOPY (EGD);  Surgeon: Inda Castle, MD;  Location: [facility];  Service: Endoscopy;  Laterality: N/A;   ESOPHAGOGASTRODUODENOSCOPY N/A 12/26/2014   Procedure: ESOPHAGOGASTRODUODENOSCOPY (EGD);  Surgeon: Inda Castle, MD;  Location: [facility];  Service: Endoscopy;  Laterality: N/A;  botox injection   ESOPHAGOGASTRODUODENOSCOPY (EGD) WITH PROPOFOL N/A 11/12/2020   Procedure: ESOPHAGOGASTRODUODENOSCOPY (EGD) WITH PROPOFOL;  Surgeon: Wilford Corner, MD;  Location: [facility];  Service: Endoscopy;  Laterality: N/A;   GASTRIC BYPASS     JOINT REPLACEMENT     right knee    KNEE ARTHROPLASTY     LAMINECTOMY     L3-L5   OPEN REDUCTION INTERNAL FIXATION (ORIF) DISTAL RADIAL FRACTURE Right 04/23/2016   Procedure: OPEN REDUCTION INTERNAL FIXATION (ORIF)  RIGHT DISTAL RADIAL FRACTURE;  Surgeon: Leanora Cover, MD;  Location: [facility];  Service: Orthopedics;  Laterality: Right;   ORIF TRIPOD FRACTURE Right 04/19/2016   Procedure: OPEN REDUCTION INTERNAL FIXATION (ORIF) TRIPOD FRACTURE LIEBINGER MIDFACE NO DRILL;  Surgeon: Leta Baptist, MD;  Location: [facility];  Service: ENT;  Laterality: Right;   REPLACEMENT TOTAL KNEE  01/2011   right   THYROIDECTOMY     TOTAL KNEE ARTHROPLASTY Left 03/13/2019   Procedure: TOTAL KNEE ARTHROPLASTY;  Surgeon: Vickey Huger, MD;  Location: [facility];  Service: Orthopedics;  Laterality: Left;    Current Outpatient Medications:    ALPRAZolam (XANAX) 0.5 MG tablet, Take 0.5 mg by mouth 2 (two) times daily as needed for anxiety.,  Disp: , Rfl:    amLODipine (NORVASC) 2.5 MG tablet, Take 2.5 mg by mouth daily., Disp: , Rfl:    carboxymethylcellulose (REFRESH PLUS) 0.5 % SOLN, Place 1 drop into both eyes 3 (three) times daily as needed (dry eyes)., Disp: , Rfl:    DULoxetine (CYMBALTA) 60 MG capsule, TAKE 2 CAPSULES (120 MG TOTAL) BY MOUTH EVERY EVENING., Disp: 180 capsule, Rfl: 0   ferrous sulfate 325 (65 FE) MG tablet, Take 325 mg by mouth daily with breakfast., Disp: , Rfl:    lamoTRIgine (LAMICTAL) 100 MG tablet, TAKE 3 TABLETS (300 MG TOTAL) BY MOUTH AT BEDTIME., Disp: 270 tablet, Rfl: 0   levothyroxine (SYNTHROID) 125 MCG tablet, Take 125 mcg by mouth daily before breakfast., Disp: , Rfl:    LORazepam (ATIVAN) 1 MG tablet, Take 1 mg by mouth at bedtime., Disp: , Rfl:    meclizine (ANTIVERT) 12.5 MG tablet, Take 12.5 mg by mouth 3 (three) times daily as needed for dizziness., Disp: , Rfl:    omeprazole (PRILOSEC) 40 MG capsule, Take 40 mg by mouth in the morning and at bedtime.,  Disp: , Rfl:    oxymetazoline (VICKS SINEX) 0.05 % nasal spray, Place 1 spray into both nostrils 2 (two) times daily as needed for congestion., Disp: , Rfl:    oxymorphone (OPANA) 5 MG tablet, Take 5 mg by mouth every 6 (six) hours as needed for pain., Disp: , Rfl:    Probiotic Product (PROBIOTIC DAILY PO), Take 2 capsules by mouth daily., Disp: , Rfl:    rOPINIRole (REQUIP) 1 MG tablet, Take 1 mg by mouth at bedtime as needed (restless leg syndrome). , Disp: , Rfl: 6  Clinical Interview:   The following information was obtained during a clinical interview with Jillian Morris and her husband prior to her initial evaluation on 08/03/2019. Sections have been updated based upon the current interview where appropriate.  Cognitive Symptoms: Decreased short-term memory: Endorsed. Jillian Morris previously reported trouble remembering the details of previous conversations (or if those conversations ever took place), names of somewhat familiar individuals,  misplacing objects around the home, losing her train of thought, and trouble performing mental calculations. These were said to have been present for the past few years and have gradually declined over time. Decreased long-term memory: Denied. Decreased attention/concentration: Endorsed. Difficulties previously included trouble maintaining her focus and increased ease of distractibility. Reduced processing speed: Endorsed. Jillian Morris previously noted this occurring "sometimes." Her husband described her as being somewhat slowed and less sharp at times. Difficulties with executive functions: Endorsed. She previously noted longstanding difficulties with organization/complex planning, but that these appear to have worsened over time. She denied difficulties with indecisiveness; however, her husband noted that it may take her longer to make decisions. Impulsivity was denied. Personality changes were noted in that Jillian Morris can appear more irritable or frustrated than what is typical for her. Difficulties with emotion regulation: Denied. Difficulties with receptive language: Denied if she is able to adequately focus on information being presented to her. Difficulties with word finding: Endorsed. She previously noted prominent difficulties with word finding, as well as having a reduced vocabulary.  Decreased visuoperceptual ability: Denied. However, she later acknowledged some trouble recognizing where her feet are in space around her, making ambulating more challenging.   Trajectory of Cognitive Decline: During the current interview, Jillian Morris and her husband described overall stability since her previous evaluation. She specifically stated that there had been no memory decline and that she felt these abilities had improved over time. Her husband did state that she has become more "accusatory," agitated, and seems more "on-edge" lately. However, he did not report any significant personality changes as some  of this was noted in 2020.    Difficulties completing ADLs: Largely denied. She manages her medications independently without difficulty. Her husband manages personal finances which is longstanding in nature. While she had given up driving in [id number] due to a single instance of her getting lost, she stated that she had resumed driving to a small degree and had not experienced any difficulties since her prior evaluation.  Additional Medical History: History of traumatic brain injury/concussion: Endorsed. In August 2017, Jillian Morris experienced a fall, leading to a small volume subarachnoid hemorrhage in the parafalcine right frontal lobe. She was hospitalized for near two weeks before being discharged. She reported a positive loss in consciousness lasting greater than 1 hour, but less than 6. She noted remembering the fall itself. Her next consistent memory was being moved from the ICU to a normal hospital room. She denied her perception that she experienced persisting cognitive deficits stemming from  this injury. No more recent head injuries were reported.  History of stroke: Denied. She alluded to a medical professional stating that her 2017 fall may have been the result of a stroke. However, neuroimaging had not revealed concerns regarding a prior infarct.  History of seizure activity: Denied. History of known exposure to toxins: Denied. Symptoms of chronic pain: Endorsed. She reported chronic back pain, present "all the time." Currently, she stated that pain medications had been recently changed. She noted that this seems to have increased symptoms of dizziness; she also stated that her speech has been affected. Her husband noted that there were plans to change this medication again. Per her medical records, she is on the same medication and same dosing as when I last saw her in 2020 for her initial evaluation.  Experience of frequent headaches/migraines: Denied. Frequent instances of dizziness/vertigo:  Endorsed. She previously reported rapid-onset instances of vertigo which have appeared to occur more frequently. Acutely, some of this seems related to pain medication changes. However, these symptoms were present at least as far back as 2019-2020.    Sensory changes: She reported appropriate vision since undergoing cataract surgery. She described her left ear having 30% hearing loss. She also noted sporadic instances of diminished senses of both taste and smell. No changes were reported.  Balance/coordination difficulties: Endorsed. She noted trouble ambulating when not on solid surfaces and acknowledged a history of several falls. Some of this may be related to ongoing back pain and history of knee replacement surgery. No changes or recent falls were reported. Other motor difficulties: Denied.  Sleep History: Estimated hours obtained each night: Unclear. Jillian Morris and her husband previously noted that she has a very abnormal sleep cycle, is generally unable to sleep through the night, and often "makes it up" during the day.  Difficulties falling asleep: Endorsed. These are somewhat aided via medication intervention. She stated that without medication, she "will be up for days." Difficulties staying asleep: Endorsed. She reported waking up for unknown reasons at times. Other times, this is related to ongoing pain symptoms, where she is unable to fall back asleep. Feels rested and refreshed upon awakening: Denied.   History of snoring: Denied. History of waking up gasping for air: Denied. Witnessed breath cessation while asleep: Denied.   History of vivid dreaming: Denied. Excessive movement while asleep: Denied.  Instances of acting out her dreams: Denied.    Psychiatric/Behavioral Health History: Depression: Endorsed. Jillian Morris was previously diagnosed with bipolar II disorder and has been treated for depressive episodes throughout her life. Current medications were said to be more  helpful at stabilizing manic symptoms but do not appear to address depressive symptoms to an acceptable degree. Recently, she noted increased frustration and depressed mood surrounding cognitive difficulties and concerns regarding a potential neurodegenerative condition. Current or remote suicidal ideation, intent, or plan was denied.  Anxiety: Endorsed. Symptoms were generalized in nature and said to be noticeable during the past several years. Mania: Endorsed (see above). Trauma History: Denied. Visual/auditory hallucinations: Denied. Delusional thoughts: Denied.   Tobacco: Denied. Alcohol: Jillian Morris reported very rare alcohol consumption and denied a history of problematic alcohol abuse or dependence.  Recreational drugs: Denied.  Family History: Problem Relation Age of Onset   Prostate cancer Maternal Grandfather    Lymphoma Maternal Uncle    Heart disease Father    Heart disease Sister    Irritable bowel syndrome Sister    Dementia Mother    Stroke Maternal Grandmother  Appendicitis Paternal Grandmother    Colon cancer Neg Hx    This information was confirmed by Jillian Morris.  Academic/Vocational History: Highest level of educational attainment: 18 years. Jillian Morris earned a Master's degree in rural sociology and statistics from [employer]. 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  History of developmental delay: Denied. History of grade repetition: Denied. Enrollment in special education courses: Denied. History of LD/ADHD: Denied.   Employment: Retired. 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   Evaluation Results:   Behavioral Observations: Jillian Morris was accompanied by her husband, arrived to her appointment on time, and was  appropriately dressed and groomed. She appeared alert and oriented. Observed gait and station were mildly slowed. She also made a comment while walking back to my office requesting her husband to help stabilize her. Gross motor functioning appeared intact upon informal observation and no abnormal movements (e.g., tremors) were noted. Her affect was generally relaxed and positive during interview. Spontaneous speech was fluent and word finding difficulties were not observed during the clinical interview. Thought processes were coherent, organized, and normal in content. Insight into her cognitive difficulties was unable to be determined due to her resistance to complete the testing portion of the current evaluation.   Informed Consent and Coding/Compliance:   The current evaluation represents a clinical evaluation for the purposes previously outlined by the referral source and is in no way reflective of a forensic evaluation.   Jillian Morris was provided with a verbal description of the nature and purpose of the present neuropsychological evaluation. Also reviewed were the foreseeable risks and/or discomforts and benefits of the procedure, limits of confidentiality, and mandatory reporting requirements of this provider. The patient was given the opportunity to ask questions and receive answers about the evaluation. Oral consent to participate was provided by the patient.   This evaluation was conducted by Christia Reading, Ph.D., ABPP-CN, board certified clinical neuropsychologist. Jillian Morris completed a clinical interview with Dr. Melvyn Novas, billed as one unit [phone]. Testing was not completed due to poor engagement/tolerance. As a separate and discrete service, Dr. Melvyn Novas spent a total of 45 minutes in report writing billed as one unit [phone].

## 2021-08-27 ENCOUNTER — Encounter: Payer: Medicare Other | Admitting: Psychology

## 2021-12-17 ENCOUNTER — Ambulatory Visit
Admission: RE | Admit: 2021-12-17 | Discharge: 2021-12-17 | Disposition: A | Discharge status: Home / Self Care | Payer: Medicare Other | Source: Ambulatory Visit | Attending: Family Medicine | Admitting: Family Medicine

## 2021-12-17 DIAGNOSIS — Z1231 Encounter for screening mammogram for malignant neoplasm of breast: Secondary | ICD-10-CM

## 2022-03-12 ENCOUNTER — Ambulatory Visit: Payer: Medicare Other | Admitting: Neurology

## 2022-03-18 DIAGNOSIS — H36 Retinal disorders in diseases classified elsewhere: Secondary | ICD-10-CM | POA: Insufficient documentation

## 2022-03-19 ENCOUNTER — Encounter: Payer: Medicare Other | Admitting: Psychology

## 2022-03-26 ENCOUNTER — Encounter: Payer: Medicare Other | Admitting: Psychology

## 2022-09-14 ENCOUNTER — Ambulatory Visit
Admission: RE | Admit: 2022-09-14 | Discharge: 2022-09-14 | Disposition: A | Discharge status: Home / Self Care | Payer: Medicare Other | Source: Ambulatory Visit | Attending: Family Medicine | Admitting: Family Medicine

## 2022-09-14 ENCOUNTER — Other Ambulatory Visit: Payer: Self-pay | Admitting: Family Medicine

## 2022-09-14 DIAGNOSIS — R058 Other specified cough: Secondary | ICD-10-CM

## 2022-10-14 ENCOUNTER — Other Ambulatory Visit (HOSPITAL_BASED_OUTPATIENT_CLINIC_OR_DEPARTMENT_OTHER): Payer: Self-pay

## 2022-10-14 MED ORDER — HYDROMORPHONE HCL 2 MG PO TABS
2.0000 mg | ORAL_TABLET | Freq: Three times a day (TID) | ORAL | 0 refills | Status: DC | PRN
  Filled 2022-10-14: qty 90, 30d supply, fill #0

## 2022-10-20 ENCOUNTER — Other Ambulatory Visit: Payer: Self-pay

## 2022-10-20 ENCOUNTER — Emergency Department (HOSPITAL_COMMUNITY): Payer: Medicare Other

## 2022-10-20 ENCOUNTER — Observation Stay (HOSPITAL_COMMUNITY)
Admission: EM | Admit: 2022-10-20 | Discharge: 2022-10-21 | Disposition: A | Discharge status: Home / Self Care | Payer: Medicare Other | Attending: Internal Medicine | Admitting: Internal Medicine

## 2022-10-20 ENCOUNTER — Encounter (HOSPITAL_COMMUNITY): Payer: Self-pay | Admitting: Internal Medicine

## 2022-10-20 DIAGNOSIS — N1832 Chronic kidney disease, stage 3b: Secondary | ICD-10-CM | POA: Insufficient documentation

## 2022-10-20 DIAGNOSIS — Z96653 Presence of artificial knee joint, bilateral: Secondary | ICD-10-CM | POA: Insufficient documentation

## 2022-10-20 DIAGNOSIS — H579 Unspecified disorder of eye and adnexa: Secondary | ICD-10-CM | POA: Diagnosis present

## 2022-10-20 DIAGNOSIS — I639 Cerebral infarction, unspecified: Secondary | ICD-10-CM | POA: Diagnosis present

## 2022-10-20 DIAGNOSIS — Z8719 Personal history of other diseases of the digestive system: Secondary | ICD-10-CM

## 2022-10-20 DIAGNOSIS — F3181 Bipolar II disorder: Secondary | ICD-10-CM | POA: Diagnosis present

## 2022-10-20 DIAGNOSIS — I129 Hypertensive chronic kidney disease with stage 1 through stage 4 chronic kidney disease, or unspecified chronic kidney disease: Secondary | ICD-10-CM | POA: Insufficient documentation

## 2022-10-20 DIAGNOSIS — G459 Transient cerebral ischemic attack, unspecified: Secondary | ICD-10-CM | POA: Diagnosis not present

## 2022-10-20 DIAGNOSIS — Z79899 Other long term (current) drug therapy: Secondary | ICD-10-CM | POA: Insufficient documentation

## 2022-10-20 DIAGNOSIS — R1313 Dysphagia, pharyngeal phase: Secondary | ICD-10-CM | POA: Insufficient documentation

## 2022-10-20 DIAGNOSIS — F411 Generalized anxiety disorder: Secondary | ICD-10-CM | POA: Diagnosis present

## 2022-10-20 DIAGNOSIS — E039 Hypothyroidism, unspecified: Secondary | ICD-10-CM | POA: Diagnosis present

## 2022-10-20 DIAGNOSIS — Z91040 Latex allergy status: Secondary | ICD-10-CM | POA: Insufficient documentation

## 2022-10-20 DIAGNOSIS — M797 Fibromyalgia: Secondary | ICD-10-CM | POA: Diagnosis not present

## 2022-10-20 DIAGNOSIS — K219 Gastro-esophageal reflux disease without esophagitis: Secondary | ICD-10-CM | POA: Diagnosis present

## 2022-10-20 DIAGNOSIS — I10 Essential (primary) hypertension: Secondary | ICD-10-CM | POA: Diagnosis not present

## 2022-10-20 DIAGNOSIS — G3184 Mild cognitive impairment, so stated: Secondary | ICD-10-CM | POA: Diagnosis present

## 2022-10-20 LAB — DIFFERENTIAL
Abs Immature Granulocytes: 0.02 10*3/uL (ref 0.00–0.07)
Basophils Absolute: 0.1 10*3/uL (ref 0.0–0.1)
Basophils Relative: 1 %
Eosinophils Absolute: 0.3 10*3/uL (ref 0.0–0.5)
Eosinophils Relative: 4 %
Immature Granulocytes: 0 %
Lymphocytes Relative: 36 %
Lymphs Abs: 3.3 10*3/uL (ref 0.7–4.0)
Monocytes Absolute: 0.5 10*3/uL (ref 0.1–1.0)
Monocytes Relative: 6 %
Neutro Abs: 4.9 10*3/uL (ref 1.7–7.7)
Neutrophils Relative %: 53 %

## 2022-10-20 LAB — CBC
HCT: 40.8 % (ref 36.0–46.0)
Hemoglobin: 13.6 g/dL (ref 12.0–15.0)
MCH: 33.7 pg (ref 26.0–34.0)
MCHC: 33.3 g/dL (ref 30.0–36.0)
MCV: 101.2 fL — ABNORMAL HIGH (ref 80.0–100.0)
Platelets: 233 10*3/uL (ref 150–400)
RBC: 4.03 MIL/uL (ref 3.87–5.11)
RDW: 13.2 % (ref 11.5–15.5)
WBC: 9.2 10*3/uL (ref 4.0–10.5)
nRBC: 0 % (ref 0.0–0.2)

## 2022-10-20 LAB — I-STAT CHEM 8, ED
BUN: 16 mg/dL (ref 8–23)
Calcium, Ion: 1.16 mmol/L (ref 1.15–1.40)
Chloride: 109 mmol/L (ref 98–111)
Creatinine, Ser: 1 mg/dL (ref 0.44–1.00)
Glucose, Bld: 94 mg/dL (ref 70–99)
HCT: 41 % (ref 36.0–46.0)
Hemoglobin: 13.9 g/dL (ref 12.0–15.0)
Potassium: 3.6 mmol/L (ref 3.5–5.1)
Sodium: 141 mmol/L (ref 135–145)
TCO2: 22 mmol/L (ref 22–32)

## 2022-10-20 LAB — PROTIME-INR
INR: 0.9 (ref 0.8–1.2)
Prothrombin Time: 12.3 seconds (ref 11.4–15.2)

## 2022-10-20 LAB — COMPREHENSIVE METABOLIC PANEL
ALT: 16 U/L (ref 0–44)
AST: 20 U/L (ref 15–41)
Albumin: 3.7 g/dL (ref 3.5–5.0)
Alkaline Phosphatase: 106 U/L (ref 38–126)
Anion gap: 9 (ref 5–15)
BUN: 14 mg/dL (ref 8–23)
CO2: 22 mmol/L (ref 22–32)
Calcium: 9 mg/dL (ref 8.9–10.3)
Chloride: 107 mmol/L (ref 98–111)
Creatinine, Ser: 1.07 mg/dL — ABNORMAL HIGH (ref 0.44–1.00)
GFR, Estimated: 53 mL/min — ABNORMAL LOW (ref >60)
Glucose, Bld: 94 mg/dL (ref 70–99)
Potassium: 3.5 mmol/L (ref 3.5–5.1)
Sodium: 138 mmol/L (ref 135–145)
Total Bilirubin: 0.5 mg/dL (ref 0.3–1.2)
Total Protein: 6.1 g/dL — ABNORMAL LOW (ref 6.5–8.1)

## 2022-10-20 LAB — APTT: aPTT: 24 seconds (ref 24–36)

## 2022-10-20 LAB — RAPID URINE DRUG SCREEN, HOSP PERFORMED
Amphetamines: NOT DETECTED
Barbiturates: NOT DETECTED
Benzodiazepines: POSITIVE — AB
Cocaine: NOT DETECTED
Opiates: POSITIVE — AB
Tetrahydrocannabinol: NOT DETECTED

## 2022-10-20 LAB — LIPID PANEL
Cholesterol: 213 mg/dL — ABNORMAL HIGH (ref 0–200)
HDL: 72 mg/dL (ref >40)
LDL Cholesterol: 122 mg/dL — ABNORMAL HIGH (ref 0–99)
Total CHOL/HDL Ratio: 3 RATIO
Triglycerides: 95 mg/dL (ref <150)
VLDL: 19 mg/dL (ref 0–40)

## 2022-10-20 LAB — CBG MONITORING, ED: Glucose-Capillary: 92 mg/dL (ref 70–99)

## 2022-10-20 LAB — ETHANOL: Alcohol, Ethyl (B): <10 mg/dL (ref <10)

## 2022-10-20 MED ORDER — SODIUM CHLORIDE 0.9 % IV SOLN: INTRAVENOUS | Status: DC

## 2022-10-20 MED ORDER — ASPIRIN 81 MG PO TBEC
81.0000 mg | DELAYED_RELEASE_TABLET | Freq: Every day | ORAL | Status: DC
  Administered 2022-10-20 – 2022-10-21 (×2): 81 mg via ORAL
  Filled 2022-10-20 (×2): qty 1

## 2022-10-20 MED ORDER — PANTOPRAZOLE SODIUM 40 MG PO TBEC
40.0000 mg | DELAYED_RELEASE_TABLET | Freq: Two times a day (BID) | ORAL | Status: DC
  Administered 2022-10-20 – 2022-10-21 (×2): 40 mg via ORAL
  Filled 2022-10-20 (×2): qty 1

## 2022-10-20 MED ORDER — IOHEXOL 350 MG/ML SOLN
75.0000 mL | Freq: Once | INTRAVENOUS | Status: AC | PRN
  Administered 2022-10-20: 75 mL via INTRAVENOUS

## 2022-10-20 MED ORDER — ACETAMINOPHEN 160 MG/5ML PO SOLN: 650.0000 mg | ORAL | Status: DC | PRN

## 2022-10-20 MED ORDER — STROKE: EARLY STAGES OF RECOVERY BOOK
Freq: Once | Status: AC
  Filled 2022-10-20: qty 1

## 2022-10-20 MED ORDER — ENOXAPARIN SODIUM 40 MG/0.4ML IJ SOSY
40.0000 mg | PREFILLED_SYRINGE | INTRAMUSCULAR | Status: DC
  Administered 2022-10-20: 40 mg via SUBCUTANEOUS
  Filled 2022-10-20: qty 0.4

## 2022-10-20 MED ORDER — SENNOSIDES-DOCUSATE SODIUM 8.6-50 MG PO TABS: 1.0000 | ORAL_TABLET | Freq: Every evening | ORAL | Status: DC | PRN

## 2022-10-20 MED ORDER — DULOXETINE HCL 60 MG PO CPEP
60.0000 mg | ORAL_CAPSULE | Freq: Two times a day (BID) | ORAL | Status: DC
  Administered 2022-10-20 – 2022-10-21 (×2): 60 mg via ORAL
  Filled 2022-10-20 (×2): qty 1

## 2022-10-20 MED ORDER — AMLODIPINE BESYLATE 2.5 MG PO TABS
2.5000 mg | ORAL_TABLET | Freq: Every day | ORAL | Status: DC
  Administered 2022-10-21: 2.5 mg via ORAL
  Filled 2022-10-20: qty 1

## 2022-10-20 MED ORDER — OXYMETAZOLINE HCL 0.05 % NA SOLN: 1.0000 | Freq: Two times a day (BID) | NASAL | Status: DC | PRN

## 2022-10-20 MED ORDER — CLOPIDOGREL BISULFATE 75 MG PO TABS
75.0000 mg | ORAL_TABLET | Freq: Every day | ORAL | Status: DC
  Administered 2022-10-20 – 2022-10-21 (×2): 75 mg via ORAL
  Filled 2022-10-20 (×2): qty 1

## 2022-10-20 MED ORDER — ROPINIROLE HCL 1 MG PO TABS: 1.0000 mg | ORAL_TABLET | Freq: Every evening | ORAL | Status: DC | PRN

## 2022-10-20 MED ORDER — ACETAMINOPHEN 650 MG RE SUPP: 650.0000 mg | RECTAL | Status: DC | PRN

## 2022-10-20 MED ORDER — LEVOTHYROXINE SODIUM 100 MCG PO TABS: 100.0000 ug | ORAL_TABLET | Freq: Every day | ORAL | Status: DC

## 2022-10-20 MED ORDER — ALPRAZOLAM 0.5 MG PO TABS: 0.5000 mg | ORAL_TABLET | Freq: Two times a day (BID) | ORAL | Status: DC | PRN

## 2022-10-20 MED ORDER — POLYVINYL ALCOHOL 1.4 % OP SOLN: 1.0000 [drp] | Freq: Three times a day (TID) | OPHTHALMIC | Status: DC | PRN

## 2022-10-20 MED ORDER — LORAZEPAM 1 MG PO TABS
1.0000 mg | ORAL_TABLET | Freq: Every day | ORAL | Status: DC
  Administered 2022-10-20: 1 mg via ORAL
  Filled 2022-10-20: qty 1

## 2022-10-20 MED ORDER — LAMOTRIGINE 100 MG PO TABS
100.0000 mg | ORAL_TABLET | Freq: Every day | ORAL | Status: DC
  Administered 2022-10-20: 100 mg via ORAL
  Filled 2022-10-20: qty 1

## 2022-10-20 MED ORDER — SODIUM CHLORIDE 0.9% FLUSH: 3.0000 mL | Freq: Once | INTRAVENOUS | Status: DC

## 2022-10-20 MED ORDER — HYDROMORPHONE HCL 2 MG PO TABS
2.0000 mg | ORAL_TABLET | Freq: Three times a day (TID) | ORAL | Status: DC | PRN
  Administered 2022-10-20 – 2022-10-21 (×2): 2 mg via ORAL
  Filled 2022-10-20 (×2): qty 1

## 2022-10-20 MED ORDER — ACETAMINOPHEN 325 MG PO TABS: 650.0000 mg | ORAL_TABLET | ORAL | Status: DC | PRN

## 2022-10-20 NOTE — Consult Note (Signed)
Neurology Consultation  Reason for Consult: Acute infarct on MRI brain  Referring Physician: Dr. Tamala Julian  CC: Transient diplopia on 10/18/22; sent to ED by ophthalmologist for stroke evaluation  History is obtained from: Patient, Chart review  HPI: Jillian Morris is a 77 y.o. female with a complex medical history including CKD stage IIIb, bipolar II disorder, dysphagia secondary to esophageal narrowing s/p esophageal dilation, frequent falls, history of TIA, history of gastric bypass with subsequent B12 deficiency and iron deficiency anemia, GAD, depression, cervical DDD, lumbar DDD s/p laminectomy, mild cognitive impairment (described as mild memory issues), Graves' disease, RLS, hypercholesterolemia, Sjogren's disease, traumatic SAH in 2017, lymphedema, and meralgia paresthetica who presented to the ED on 10/20/2022 after being sent by her ophthalmologist. Patient states that on Sunday evening, 10/18/2022, she had a 20 minute episode of transient diplopia followed by blurred vision.  At the time of diplopia onset, the patient also noted right > left fingertip numbness and tingling and a right posterior mild headache with quick resolution. The bilateral hand numbness right > left has been transient since onset though patient attributes this to her cervical spine stenosis.  Regarding her blurry vision, the patient states that for approximately 1 year she has episodes of blurred vision described as a mucous film over her eye.  These episodes occur every 1-2 months and then she notices them for 2-3 days at a time where her vision becomes "fuzzy" for a few minutes before clearing.  She recently inquired about thyroid eye disease diagnosis and complains of dry eye.  She describes her current blurred vision as consistent with previous similar episodes that occur transiently every 1-2 months. She denies further diplopia events, motor weakness, or sensory deficits.  Due to her diplopia event, the patient was  evaluated by an ophthalmologist today who sent her to the ED for further evaluation.   ROS: A complete ROS was performed and is negative except as noted in the HPI.   Past Medical History:  Diagnosis Date   Abnormal gait    Achalasia 08/09/2013   Age-related osteoporosis without current pathological fracture 06/29/2017   Allergic rhinitis    Arthritis    Bipolar II disorder ([place]) 04/28/2018   Chronic kidney disease, stage 3b    Chronic otitis externa    Closed fracture of right distal radius    Closed fracture of zygomatic arch    DDD (degenerative disc disease), cervical 06/29/2017   DDD (degenerative disc disease), lumbar 06/29/2017   s/p laminectomy    Decreased estrogen level    Dysphagia    due to esophageal narrowing, able to take meds with thin liquid sif necessary    Essential hypertension    was briefly on losartan back early 2020 , had a severe reaction and was hospitalized , losartan was dc'd and she report shas not been prescribed any new bp meds as of yet, denies lasting issues from hospitalization    Essential tremor 06/29/2017   Fibromyalgia    Frequent falls    last fall was 03-09-2019 , my knee gave out. other times i feel like its vertigo . like a spacey feeling " . denies loc or loss of time , did not hit head    GAD (generalized anxiety disorder) 07/10/2018   Generalized abdominal pain    GERD (gastroesophageal reflux disease)    Graves disease    History of hiatal hernia    History of TIA (transient ischemic attack) 06/29/2017   History of total knee  replacement, right 06/29/2017   Hypothyroidism    IBS (irritable bowel syndrome)    resolved with use of oxymorhpne    Insomnia 07/10/2018   Iron deficiency anemia    s/p gastric bypass, malabsorption   Joint pain    Lymphedema    Major depressive disorder    Meralgia paraesthetica    Migraine    Mild cognitive impairment of uncertain or unknown etiology 08/03/2019   Pedal edema    Pharyngeal dysphagia     Primary osteoarthritis of left knee 06/29/2017   Pure hypercholesterolemia    Restless legs    Right wrist fracture 04/20/2016   Rosacea    S/P dilation of esophageal narrowing    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   Sjogren's disease 06/29/2017   Stricture and stenosis of esophagus 03/30/2012   Traumatic subarachnoid bleed with LOC of 1 hour to 5 hours 59 minutes 04/16/2016   Vitamin B12 deficiency    Past Surgical History:  Procedure Laterality Date   ABDOMINAL HYSTERECTOMY     APPENDECTOMY     AUGMENTATION MAMMAPLASTY Bilateral    in her 40's pt had a reaction to her implants, she had them removed   BALLOON DILATION  03/30/2012   Procedure: BALLOON DILATION;  Surgeon: Inda Castle, MD;  Location: [facility];  Service: Endoscopy;  Laterality: N/A;   BOTOX INJECTION N/A 12/26/2014   Procedure: BOTOX INJECTION;  Surgeon: Inda Castle, MD;  Location: [facility];  Service: Endoscopy;  Laterality: N/A;   BOTOX INJECTION  11/12/2020   Procedure: BOTOX INJECTION;  Surgeon: Wilford Corner, MD;  Location: [facility];  Service: Endoscopy;;   ENDOMETRIAL ABLATION     x 4   ESOPHAGEAL MANOMETRY N/A 05/15/2013   Procedure: ESOPHAGEAL MANOMETRY (EM);  Surgeon: Inda Castle, MD;  Location: [facility];  Service: Endoscopy;  Laterality: N/A;   ESOPHAGOGASTRODUODENOSCOPY  03/30/2012   Procedure: ESOPHAGOGASTRODUODENOSCOPY (EGD);  Surgeon: Inda Castle, MD;  Location: [facility];  Service: Endoscopy;  Laterality: N/A;   ESOPHAGOGASTRODUODENOSCOPY  05/04/2012   Procedure: ESOPHAGOGASTRODUODENOSCOPY (EGD);  Surgeon: Inda Castle, MD;  Location: [facility];  Service: Endoscopy;  Laterality: N/A;   ESOPHAGOGASTRODUODENOSCOPY N/A 12/26/2014   Procedure: ESOPHAGOGASTRODUODENOSCOPY (EGD);  Surgeon: Inda Castle, MD;  Location: [facility];  Service: Endoscopy;  Laterality: N/A;  botox injection   ESOPHAGOGASTRODUODENOSCOPY (EGD) WITH PROPOFOL N/A 11/12/2020   Procedure:  ESOPHAGOGASTRODUODENOSCOPY (EGD) WITH PROPOFOL;  Surgeon: Wilford Corner, MD;  Location: [facility];  Service: Endoscopy;  Laterality: N/A;   GASTRIC BYPASS     JOINT REPLACEMENT     right knee    KNEE ARTHROPLASTY     LAMINECTOMY     L3-L5   OPEN REDUCTION INTERNAL FIXATION (ORIF) DISTAL RADIAL FRACTURE Right 04/23/2016   Procedure: OPEN REDUCTION INTERNAL FIXATION (ORIF)  RIGHT DISTAL RADIAL FRACTURE;  Surgeon: Leanora Cover, MD;  Location: [facility];  Service: Orthopedics;  Laterality: Right;   ORIF TRIPOD FRACTURE Right 04/19/2016   Procedure: OPEN REDUCTION INTERNAL FIXATION (ORIF) TRIPOD FRACTURE LIEBINGER MIDFACE NO DRILL;  Surgeon: Leta Baptist, MD;  Location: [facility];  Service: ENT;  Laterality: Right;   REPLACEMENT TOTAL KNEE  01/2011   right   THYROIDECTOMY     TOTAL KNEE ARTHROPLASTY Left 03/13/2019   Procedure: TOTAL KNEE ARTHROPLASTY;  Surgeon: Vickey Huger, MD;  Location: [facility];  Service: Orthopedics;  Laterality: Left;   Family History  Problem Relation Age of Onset   Prostate cancer Maternal Grandfather    Lymphoma  Maternal Uncle    Heart disease Father    Heart disease Sister    Irritable bowel syndrome Sister    Dementia Mother    Stroke Maternal Grandmother    Appendicitis Paternal Grandmother    Colon cancer Neg Hx    Social History:   reports that she has never smoked. She has never used smokeless tobacco. She reports that she does not drink alcohol and does not use drugs.  Medications  Current Facility-Administered Medications:    [START ON 10/21/2022]  stroke: early stages of recovery book, , Does not apply, Once, Smith, Rondell A, MD   0.9 %  sodium chloride infusion, , Intravenous, Continuous, Fuller Plan A, MD, Last Rate: 75 mL/hr at 10/20/22 1619, New Bag at 10/20/22 1619   acetaminophen (TYLENOL) tablet 650 mg, 650 mg, Oral, Q4H PRN **OR** acetaminophen (TYLENOL) 160 MG/5ML solution 650 mg, 650 mg, Per Tube, Q4H PRN **OR** acetaminophen (TYLENOL) suppository  650 mg, 650 mg, Rectal, Q4H PRN, Smith, Rondell A, MD   enoxaparin (LOVENOX) injection 40 mg, 40 mg, Subcutaneous, Q24H, Smith, Rondell A, MD   senna-docusate (Senokot-S) tablet 1 tablet, 1 tablet, Oral, QHS PRN, Smith, Rondell A, MD   sodium chloride flush (NS) 0.9 % injection 3 mL, 3 mL, Intravenous, Once, Redwine, Madison A, PA-C  Current Outpatient Medications:    ALPRAZolam (XANAX) 0.5 MG tablet, Take 0.5 mg by mouth 2 (two) times daily as needed for anxiety., Disp: , Rfl:    amLODipine (NORVASC) 2.5 MG tablet, Take 2.5 mg by mouth daily., Disp: , Rfl:    carboxymethylcellulose (REFRESH PLUS) 0.5 % SOLN, Place 1 drop into both eyes 3 (three) times daily as needed (dry eyes)., Disp: , Rfl:    DULoxetine (CYMBALTA) 60 MG capsule, TAKE 2 CAPSULES (120 MG TOTAL) BY MOUTH EVERY EVENING., Disp: 180 capsule, Rfl: 0   ferrous sulfate 325 (65 FE) MG tablet, Take 325 mg by mouth daily with breakfast., Disp: , Rfl:    HYDROmorphone (DILAUDID) 2 MG tablet, Take 1 tablet (2 mg total) by mouth 3 (three) times daily as needed  for pain, Disp: 90 tablet, Rfl: 0   lamoTRIgine (LAMICTAL) 100 MG tablet, TAKE 3 TABLETS (300 MG TOTAL) BY MOUTH AT BEDTIME., Disp: 270 tablet, Rfl: 0   levothyroxine (SYNTHROID) 125 MCG tablet, Take 125 mcg by mouth daily before breakfast., Disp: , Rfl:    LORazepam (ATIVAN) 1 MG tablet, Take 1 mg by mouth at bedtime., Disp: , Rfl:    meclizine (ANTIVERT) 12.5 MG tablet, Take 12.5 mg by mouth 3 (three) times daily as needed for dizziness., Disp: , Rfl:    omeprazole (PRILOSEC) 40 MG capsule, Take 40 mg by mouth in the morning and at bedtime., Disp: , Rfl:    oxymetazoline (VICKS SINEX) 0.05 % nasal spray, Place 1 spray into both nostrils 2 (two) times daily as needed for congestion., Disp: , Rfl:    oxymorphone (OPANA) 5 MG tablet, Take 5 mg by mouth every 6 (six) hours as needed for pain., Disp: , Rfl:    Probiotic Product (PROBIOTIC DAILY PO), Take 2 capsules by mouth daily., Disp:  , Rfl:    rOPINIRole (REQUIP) 1 MG tablet, Take 1 mg by mouth at bedtime as needed (restless leg syndrome). , Disp: , Rfl: 6  Exam: Current vital signs: BP (!) 122/92   Pulse 81   Temp 97.9 F (36.6 C) (Oral)   Resp 15   SpO2 100%  Vital signs in last 24  hours: Temp:  [97.8 F (36.6 C)-97.9 F (36.6 C)] 97.9 F (36.6 C) (02/27 1509) Pulse Rate:  [81-84] 81 (02/27 1515) Resp:  [15-18] 15 (02/27 1515) BP: (122-152)/(82-92) 122/92 (02/27 1515) SpO2:  [98 %-100 %] 100 % (02/27 1515)  GENERAL: Awake, alert, in no acute distress Psych: Affect appropriate for situation, patient is calm and cooperative with examination Head: Normocephalic and atraumatic, without obvious abnormality EENT: Normal conjunctivae, dry mucous membranes, no OP obstruction LUNGS: Normal respiratory effort. Non-labored breathing on room air CV: Regular rate and rhythm on telemetry ABDOMEN: Soft, non-tender, non-distended Extremities: Warm, well perfused, 1+ nonpitting edema bilaterally  NEURO:  Mental Status: Awake, alert, and oriented to person, place, time, and situation. She is able to provide a clear and coherent history of present illness. Speech/Language: speech is intact without dysarthria.   Naming, repetition, fluency, and comprehension intact without aphasia  No neglect is noted.  Patient follows commands without difficulty.  Cranial Nerves:  II: PERRL. Visual fields full.  III, IV, VI: EOMI without ptosis, diplopia, or nystagmus.  V: Sensation is intact to light touch and symmetrical to face.  VII: Face is symmetric resting and smiling.  VIII: Hearing is intact to voice IX, X: Palate elevation is symmetric. Phonation normal.  XI: Normal sternocleidomastoid and trapezius muscle strength XII: Tongue protrudes midline without fasciculations.   Motor: 5/5 strength is all muscle groups without vertical drift. Confrontational strength testing of bilateral upper extremities (right > left) limited due  to shoulder pain. Patient is scheduled for shoulder surgery in March.   Tone is normal. Bulk is normal.  Sensation: Intact to light touch bilaterally in all four extremities.  Coordination: FTN intact bilaterally. HKS intact bilaterally. No pronator drift.  Gait: Deferred  NIHSS: 0  Labs I have reviewed labs in epic and the results pertinent to this consultation are: CBC    Component Value Date/Time   WBC 9.2 10/20/2022 1243   RBC 4.03 10/20/2022 1243   HGB 13.9 10/20/2022 1252   HCT 41.0 10/20/2022 1252   PLT 233 10/20/2022 1243   MCV 101.2 (H) 10/20/2022 1243   MCH 33.7 10/20/2022 1243   MCHC 33.3 10/20/2022 1243   RDW 13.2 10/20/2022 1243   LYMPHSABS 3.3 10/20/2022 1243   MONOABS 0.5 10/20/2022 1243   EOSABS 0.3 10/20/2022 1243   BASOSABS 0.1 10/20/2022 1243   CMP     Component Value Date/Time   NA 141 10/20/2022 1252   K 3.6 10/20/2022 1252   CL 109 10/20/2022 1252   CO2 22 10/20/2022 1243   GLUCOSE 94 10/20/2022 1252   BUN 16 10/20/2022 1252   CREATININE 1.00 10/20/2022 1252   CALCIUM 9.0 10/20/2022 1243   PROT 6.1 (L) 10/20/2022 1243   ALBUMIN 3.7 10/20/2022 1243   AST 20 10/20/2022 1243   ALT 16 10/20/2022 1243   ALKPHOS 106 10/20/2022 1243   BILITOT 0.5 10/20/2022 1243   GFRNONAA 53 (L) 10/20/2022 1243   GFRAA >60 03/14/2019 0303   Lipid Panel  No results found for: "CHOL", "TRIG", "HDL", "CHOLHDL", "VLDL", "LDLCALC", "LDLDIRECT" Lab Results  Component Value Date   HGBA1C 5.3 01/09/2021   Imaging I have reviewed the images obtained:  MRI examination of the brain 10/20/2022: 1. 2 mm acute infarct within the right corona radiata. 2. Chronic small-vessel ischemic changes within the cerebral white matter, basal ganglia and pons. Findings are overall moderate in severity and slightly progressed from the prior MRI of 04/01/2019. 3. Small chronic infarct within the  left cerebellar hemisphere, new from the prior MRI. 4. Mild-to-moderate generalized cerebral  atrophy. 5. Small left mastoid effusion.  CT angio head neck w+wo 10/20/2022: Mild atherosclerosis in the head and neck without a large vessel occlusion or high-grade proximal stenosis.  Assessment: 77 year old female with medical history as above who presented to the ED on 10/20/2022 after being sent by her ophthalmologist for stroke evaluation after experiencing a transient episode of diplopia on Sunday evening 10/18/2022 lasting approximately 20 minutes.  At the onset of diplopia, patient states she had right > left fingertip numbness with subsequent blurry vision.  Patient's blurry vision is consistent with previous episodes over the past year of transient blurred vision described as a "mucous film" over her eye for 1-2 minutes at a time for 2-3 days consecutively once every 1-2 months. She attributes her fingertip numbness to her known cervical spine stenosis.   - Examination reveals patient with right > left fingertip numbness and limited confrontational strength testing of BUE due to pain though her exam is largely unremarkable with an NIHSS of 0.  She denies further episodes of diplopia and no longer complains of blurred vision.  - MRI brain on arrival reveals a 2 mm acute infarct within the right corona radiata in addition to a small chronic infarct within the left cerebellar hemisphere. Vessel imaging negative for LVO or high-grade proximal stenosis.  - Acute infarct on imaging is felt to be an incidental finding as her symptoms of diplopia and right > left fingertip numbness do not localize to the right corona radiata location of the acute infarct. Will complete stroke work up and initiate stroke prophylaxis.  - Stroke risk factors include advanced age, reported history of TIA, family history of stroke (maternal grandmother), history of migraine headaches, and hyperlipidemia.  Recommendations: - Serum HgbA1c, fasting lipid panel - Frequent neuro checks - Echocardiogram - Prophylactic therapy-  Antiplatelet med: Aspirin EC 81 mg PO daily in addition to clopidogrel 75 mg PO daily for 21 days followed by ASA monotherapy  - Risk factor modification - Telemetry monitoring - Stroke team to follow, will need to follow up with outpatient neurology   Pt seen by NP/Neuro and later by MD. Note/plan to be edited by MD as needed.  Anibal Henderson, AGAC-NP [facility] Pager: [phone]   Attending Neurohospitalist Addendum Patient seen and examined with APP/Resident. Agree with the history and physical as documented above. Agree with the plan as documented, which I helped formulate. I have edited the note above to reflect my full findings and recommendations. I have independently reviewed the chart, obtained history, review of systems and examined the patient.I have personally reviewed pertinent head/neck/spine imaging (CT/MRI). Please feel free to call with any questions.  -- Su Monks, MD [facility] [phone]  If 7pm- 7am, please page neurology on call as listed in [facility].

## 2022-10-20 NOTE — ED Provider Triage Note (Signed)
Emergency Medicine Provider Triage Evaluation Note  Jillian Morris , a 77 y.o. female  was evaluated in triage.  Patient sent by ophthalmology with concern for a stroke.  Patient tells me that on Sunday she had some double vision in her left eye.  Lasted around 20 minutes and resolved.  Ever since then she has had some blurred vision only in her left eye.  No pain with EOMs.  No history of stroke.  Not on any blood thinners.  No weakness, numbness, tingling, facial droop or aphasia  Review of Systems  Positive:  Negative:   Physical Exam  BP (!) 152/82 (BP Location: Left Arm)   Pulse 84   Temp 97.8 F (36.6 C)   Resp 18   SpO2 98%  Gen:   Awake, no distress   Resp:  Normal effort  MSK:   Moves extremities without difficulty  Other:  EOMs intact, does not appear to have a visual field deficit.  PERRLA bilaterally  Medical Decision Making  Medically screening exam initiated at 12:22 PM.  Appropriate orders placed.  Jillian Morris was informed that the remainder of the evaluation will be completed by another provider, this initial triage assessment does not replace that evaluation, and the importance of remaining in the ED until their evaluation is complete.    Spoke with Dr. Quinn Axe with neurology who recommends MRI and CT angiogram.  Patient made aware, RN placing IV   Jillian Morris, [place], PA-C 10/20/22 1225

## 2022-10-20 NOTE — ED Notes (Signed)
ED TO INPATIENT HANDOFF REPORT  ED Nurse Name and Phone #:  Lorenda Ishihara [phone]  S Name/Age/Gender Jillian Morris 77 y.o. female Room/Bed: 043C/043C  Code Status   Code Status: Full Code  Home/SNF/Other Home Patient oriented to: self, place, time, and situation Is this baseline? Yes   Triage Complete: Triage complete  Chief Complaint CVA (cerebral vascular accident) [facility]) [I63.9]  Triage Note Pt had twenty minute episode of double vision in L eye on Sunday night and has had blurry vision after the double vision resolved. Had a mild headache at the time of onset of double vision but that has now subsided as well.    Allergies Allergies  Allergen Reactions   Losartan Rash   Actonel [Risedronate Sodium]     GI Upset   Ambien [Zolpidem Tartrate] Other (See Comments)    Sleep-walking, hallucinations   Depakote [Divalproex Sodium]     Hallucinations   Inderal [Propranolol] Other (See Comments)    Hallucinations    Nsaids Other (See Comments)    Pt. Had gastric bypass surgery    Penicillins Other (See Comments)    GI upset. Did it involve swelling of the face/tongue/throat, SOB, or low BP? No Did it involve sudden or severe rash/hives, skin peeling, or any reaction on the inside of your mouth or nose? No Did you need to seek medical attention at a hospital or doctor's office? No When did it last happen?    As a child If all above answers are "NO", may proceed with cephalosporin use.     Silicon Nausea And Vomiting    Thyroid storm   Adhesive [Tape] Itching and Rash    Please use "paper" tape   Cefuroxime Axetil Rash   Latex Swelling and Rash    Redness lips   Nickel Itching and Rash    Contact Dermatitis (intolerance)    Level of Care/Admitting Diagnosis ED Disposition     ED Disposition  Admit   Condition  --   Comment  Hospital Area: [place] [[id number]]  Level of Care: Telemetry Medical [104]  May place patient in observation at [facility]  or [place] if equivalent level of care is available:: No  Covid Evaluation: Asymptomatic - no recent exposure (last 10 days) testing not required  Diagnosis: CVA (cerebral vascular accident) [facility]) [id number]  Admitting Physician: Norval Morton [id number]  Attending Physician: Norval Morton [id number]          B Medical/Surgery History Past Medical History:  Diagnosis Date   Abnormal gait    Achalasia 08/09/2013   Age-related osteoporosis without current pathological fracture 06/29/2017   Allergic rhinitis    Arthritis    Bipolar II disorder ([place]) 04/28/2018   Chronic kidney disease, stage 3b    Chronic otitis externa    Closed fracture of right distal radius    Closed fracture of zygomatic arch    DDD (degenerative disc disease), cervical 06/29/2017   DDD (degenerative disc disease), lumbar 06/29/2017   s/p laminectomy    Decreased estrogen level    Dysphagia    due to esophageal narrowing, able to take meds with thin liquid sif necessary    Essential hypertension    was briefly on losartan back early 2020 , had a severe reaction and was hospitalized , losartan was dc'd and she report shas not been prescribed any new bp meds as of yet, denies lasting issues from hospitalization    Essential tremor 06/29/2017   Fibromyalgia  Frequent falls    last fall was 03-09-2019 , my knee gave out. other times i feel like its vertigo . like a spacey feeling " . 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   Hypothyroidism    IBS (irritable bowel syndrome)    resolved with use of oxymorhpne    Insomnia 07/10/2018   Iron deficiency anemia    s/p gastric bypass, malabsorption   Joint pain    Lymphedema     Major depressive disorder    Meralgia paraesthetica    Migraine    Mild cognitive impairment of uncertain or unknown etiology 08/03/2019   Pedal edema    Pharyngeal dysphagia    Primary osteoarthritis of left knee 06/29/2017   Pure hypercholesterolemia    Restless legs    Right wrist fracture 04/20/2016   Rosacea    S/P dilation of esophageal narrowing    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   Sjogren's disease 06/29/2017   Stricture and stenosis of esophagus 03/30/2012   Traumatic subarachnoid bleed with LOC of 1 hour to 5 hours 59 minutes 04/16/2016   Vitamin B12 deficiency    Past Surgical History:  Procedure Laterality Date   ABDOMINAL HYSTERECTOMY     APPENDECTOMY     AUGMENTATION MAMMAPLASTY Bilateral    in her 40's pt had a reaction to her implants, she had them removed   BALLOON DILATION  03/30/2012   Procedure: BALLOON DILATION;  Surgeon: Inda Castle, MD;  Location: [facility];  Service: Endoscopy;  Laterality: N/A;   BOTOX INJECTION N/A 12/26/2014   Procedure: BOTOX INJECTION;  Surgeon: Inda Castle, MD;  Location: [facility];  Service: Endoscopy;  Laterality: N/A;   BOTOX INJECTION  11/12/2020   Procedure: BOTOX INJECTION;  Surgeon: Wilford Corner, MD;  Location: [facility];  Service: Endoscopy;;   ENDOMETRIAL ABLATION     x 4   ESOPHAGEAL MANOMETRY N/A 05/15/2013   Procedure: ESOPHAGEAL MANOMETRY (EM);  Surgeon: Inda Castle, MD;  Location: [facility];  Service: Endoscopy;  Laterality: N/A;   ESOPHAGOGASTRODUODENOSCOPY  03/30/2012   Procedure: ESOPHAGOGASTRODUODENOSCOPY (EGD);  Surgeon: Inda Castle, MD;  Location: [facility];  Service: Endoscopy;  Laterality: N/A;   ESOPHAGOGASTRODUODENOSCOPY  05/04/2012   Procedure: ESOPHAGOGASTRODUODENOSCOPY (EGD);  Surgeon: Inda Castle, MD;  Location: [facility];  Service: Endoscopy;  Laterality: N/A;   ESOPHAGOGASTRODUODENOSCOPY N/A 12/26/2014   Procedure: ESOPHAGOGASTRODUODENOSCOPY (EGD);  Surgeon: Inda Castle, MD;  Location: [facility];  Service: Endoscopy;  Laterality: N/A;  botox injection   ESOPHAGOGASTRODUODENOSCOPY (EGD) WITH PROPOFOL N/A 11/12/2020   Procedure: ESOPHAGOGASTRODUODENOSCOPY (EGD) WITH PROPOFOL;  Surgeon: Wilford Corner, MD;  [facility];  Service: Endoscopy;  Laterality: N/A;   GASTRIC BYPASS     JOINT REPLACEMENT     right knee    KNEE ARTHROPLASTY     LAMINECTOMY     L3-L5   OPEN REDUCTION INTERNAL FIXATION (ORIF) DISTAL RADIAL FRACTURE Right 04/23/2016   Procedure: OPEN REDUCTION INTERNAL FIXATION (ORIF)  RIGHT DISTAL RADIAL FRACTURE;  Surgeon: Leanora Cover, MD;  Location: [place];  Service: Orthopedics;  Laterality: Right;   ORIF TRIPOD FRACTURE Right 04/19/2016   Procedure: OPEN REDUCTION  INTERNAL FIXATION (ORIF) TRIPOD FRACTURE LIEBINGER MIDFACE NO DRILL;  Surgeon: Leta Baptist, MD;  Location: [place] [facility];  Service: ENT;  Laterality: Right;   REPLACEMENT TOTAL KNEE  01/2011   right   THYROIDECTOMY     TOTAL KNEE ARTHROPLASTY Left 03/13/2019   Procedure: TOTAL KNEE ARTHROPLASTY;  Surgeon: Vickey Huger, MD;  Location: [facility];  Service: Orthopedics;  Laterality: Left;     A IV Location/Drains/Wounds Patient Lines/Drains/Airways Status     Active Line/Drains/Airways     Name Placement date Placement time Site Days   Peripheral IV 20 G Left;Posterior Hand --  --  Hand  --            Intake/Output Last 24 hours No intake or output data in the 24 hours ending 10/20/22 1908  Labs/Imaging Results for orders placed or performed during the hospital encounter of 10/20/22 (from the past 48 hour(s))  Protime-INR     Status: None   Collection Time: 10/20/22 12:43 PM  Result Value Ref Range   Prothrombin Time 12.3 11.4 - 15.2 seconds   INR 0.9 0.8 - 1.2    Comment: (NOTE) INR goal varies based on device and disease states. Performed at [facility], [place] [address] [place], [place] [address]   APTT     Status: None   Collection Time: 10/20/22 12:43 PM   Result Value Ref Range   aPTT 24 24 - 36 seconds    Comment: Performed at [facility] [address], [place], [place] [address]  CBC     Status: Abnormal   Collection Time: 10/20/22 12:43 PM  Result Value Ref Range   WBC 9.2 4.0 - 10.5 K/uL   RBC 4.03 3.87 - 5.11 MIL/uL   Hemoglobin 13.6 12.0 - 15.0 g/dL   HCT 40.8 36.0 - 46.0 %   MCV 101.2 (H) 80.0 - 100.0 fL   MCH 33.7 26.0 - 34.0 pg   MCHC 33.3 30.0 - 36.0 g/dL   RDW 13.2 11.5 - 15.5 %   Platelets 233 150 - 400 K/uL   nRBC 0.0 0.0 - 0.2 %    Comment: Performed at [facility], [place] [address] [place], [place] [address]  Differential     Status: None   Collection Time: 10/20/22 12:43 PM  Result Value Ref Range   Neutrophils Relative % 53 %   Neutro Abs 4.9 1.7 - 7.7 K/uL   Lymphocytes Relative 36 %   Lymphs Abs 3.3 0.7 - 4.0 K/uL   Monocytes Relative 6 %   Monocytes Absolute 0.5 0.1 - 1.0 K/uL   Eosinophils Relative 4 %   Eosinophils Absolute 0.3 0.0 - 0.5 K/uL   Basophils Relative 1 %   Basophils Absolute 0.1 0.0 - 0.1 K/uL   Immature Granulocytes 0 %   Abs Immature Granulocytes 0.02 0.00 - 0.07 K/uL    Comment: Performed at [facility] [address], [place], [place] [address]  Comprehensive metabolic panel     Status: Abnormal   Collection Time: 10/20/22 12:43 PM  Result Value Ref Range   Sodium 138 135 - 145 mmol/L   Potassium 3.5 3.5 - 5.1 mmol/L   Chloride 107 98 - 111 mmol/L   CO2 22 22 - 32 mmol/L   Glucose, Bld 94 70 - 99 mg/dL    Comment: Glucose reference range applies only to samples taken after fasting for at least 8 hours.   BUN 14 8 -  23 mg/dL   Creatinine, Ser 1.07 (H) 0.44 - 1.00 mg/dL   Calcium 9.0 8.9 - 10.3 mg/dL   Total Protein 6.1 (L) 6.5 - 8.1 g/dL   Albumin 3.7 3.5 - 5.0 g/dL   AST 20 15 - 41 U/L   ALT 16 0 - 44 U/L   Alkaline Phosphatase 106 38 - 126 U/L   Total Bilirubin 0.5 0.3 - 1.2 mg/dL   GFR, Estimated 53 (L) >60 mL/min    Comment: (NOTE) Calculated using  the CKD-EPI Creatinine Equation (2021)    Anion gap 9 5 - 15    Comment: Performed at [facility] [address] [place], [place] [address]  Ethanol     Status: None   Collection Time: 10/20/22 12:43 PM  Result Value Ref Range   Alcohol, Ethyl (B) <10 <10 mg/dL    Comment: (NOTE) Lowest detectable limit for serum alcohol is 10 mg/dL.  For medical purposes only. Performed at [facility], [place] [address], [place], [place] [address]   I-stat chem 8, ED     Status: None   Collection Time: 10/20/22 12:52 PM  Result Value Ref Range   Sodium 141 135 - 145 mmol/L   Potassium 3.6 3.5 - 5.1 mmol/L   Chloride 109 98 - 111 mmol/L   BUN 16 8 - 23 mg/dL   Creatinine, Ser 1.00 0.44 - 1.00 mg/dL   Glucose, Bld 94 70 - 99 mg/dL    Comment: Glucose reference range applies only to samples taken after fasting for at least 8 hours.   Calcium, Ion 1.16 1.15 - 1.40 mmol/L   TCO2 22 22 - 32 mmol/L   Hemoglobin 13.9 12.0 - 15.0 g/dL   HCT 41.0 36.0 - 46.0 %  CBG monitoring, ED     Status: None   Collection Time: 10/20/22  3:21 PM  Result Value Ref Range   Glucose-Capillary 92 70 - 99 mg/dL    Comment: Glucose reference range applies only to samples taken after fasting for at least 8 hours.  Lipid panel     Status: Abnormal   Collection Time: 10/20/22  4:22 PM  Result Value Ref Range   Cholesterol 213 (H) 0 - 200 mg/dL   Triglycerides 95 <150 mg/dL   HDL 72 >40 mg/dL   Total CHOL/HDL Ratio 3.0 RATIO   VLDL 19 0 - 40 mg/dL   LDL Cholesterol 122 (H) 0 - 99 mg/dL    Comment:        Total Cholesterol/HDL:CHD Risk Coronary Heart Disease Risk Table                     Men   Women  1/2 Average Risk   3.4   3.3  Average Risk       5.0   4.4  2 X Average Risk   9.6   7.1  3 X Average Risk  23.4   11.0        Use the calculated Patient Ratio above and the CHD Risk Table to determine the patient's CHD Risk.        ATP III CLASSIFICATION (LDL):  <100     mg/dL   Optimal  100-129   mg/dL   Near or Above                    Optimal  130-159  mg/dL   Borderline  160-189  mg/dL   High  >  190     mg/dL   Very High Performed at [facility], [place] [address], [place], [place] [address]   Rapid urine drug screen (hospital performed)     Status: Abnormal   Collection Time: 10/20/22  6:00 PM  Result Value Ref Range   Opiates POSITIVE (A) NONE DETECTED   Cocaine NONE DETECTED NONE DETECTED   Benzodiazepines POSITIVE (A) NONE DETECTED   Amphetamines NONE DETECTED NONE DETECTED   Tetrahydrocannabinol NONE DETECTED NONE DETECTED   Barbiturates NONE DETECTED NONE DETECTED    Comment: (NOTE) DRUG SCREEN FOR MEDICAL PURPOSES ONLY.  IF CONFIRMATION IS NEEDED FOR ANY PURPOSE, NOTIFY LAB WITHIN 5 DAYS.  LOWEST DETECTABLE LIMITS FOR URINE DRUG SCREEN Drug Class                     Cutoff (ng/mL) Amphetamine and metabolites    1000 Barbiturate and metabolites    200 Benzodiazepine                 200 Opiates and metabolites        300 Cocaine and metabolites        300 THC                            50 Performed at [facility], [place] [address], [place],  [address]    CT ANGIO HEAD NECK W WO CM  Result Date: 10/20/2022 CLINICAL DATA:  Neuro deficit, acute, stroke suspected.  Diplopia. EXAM: CT ANGIOGRAPHY HEAD AND NECK TECHNIQUE: Multidetector CT imaging of the head and neck was performed using the standard protocol during bolus administration of intravenous contrast. Multiplanar CT image reconstructions and MIPs were obtained to evaluate the vascular anatomy. Carotid stenosis measurements (when applicable) are obtained utilizing NASCET criteria, using the distal internal carotid diameter as the denominator. RADIATION DOSE REDUCTION: This exam was performed according to the departmental dose-optimization program which includes automated exposure control, adjustment of the mA and/or kV according to patient size and/or use of iterative reconstruction technique.  CONTRAST:  56m OMNIPAQUE IOHEXOL 350 MG/ML SOLN COMPARISON:  Head MRI 10/20/2022 FINDINGS: CT HEAD FINDINGS Brain: There is no evidence of an acute large territory infarct, intracranial hemorrhage, mass, midline shift, or extra-axial fluid collection. The punctate acute infarct in the right corona radiata on MRI is occult by CT. Patchy hypodensities in the cerebral white matter and pons are nonspecific but compatible with moderate chronic small vessel ischemic disease as described on MRI. A tiny chronic left cerebellar infarct is again noted. Cerebral atrophy is mild for age. Vascular: Calcified atherosclerosis at the skull base. Skull: No fracture or suspicious osseous lesion. Sinuses/Orbits: Paranasal sinuses and mastoid air cells are clear. Bilateral cataract extraction. Other: None. Review of the MIP images confirms the above findings CTA NECK FINDINGS Aortic arch: The aortic arch and proximal arch vessels were not imaged. Right carotid system: Patent with a small amount of calcified and soft plaque at the carotid bifurcation. No evidence of a significant stenosis or dissection. Left carotid system: Exclusion of the proximal common carotid artery, otherwise patent without evidence stenosis, dissection, or significant atherosclerosis. Vertebral arteries: Patent and codominant with limited assessment of the proximal V1 segments due to motion and shoulder artifact. No evidence of a significant stenosis or dissection more distally. Skeleton: Advanced disc and facet degeneration in the cervical spine. Other neck: No evidence of cervical lymphadenopathy or mass. Upper chest: No  mass or consolidation in the included lung apices. Review of the MIP images confirms the above findings CTA HEAD FINDINGS Anterior circulation: The internal carotid arteries are patent from skull base to carotid termini with mild atherosclerotic plaque bilaterally not resulting in significant stenosis. ACAs and MCAs are patent with mild  atherosclerotic irregularity but no evidence of a proximal branch occlusion or high-grade proximal stenosis. No aneurysm is identified. Posterior circulation: The intracranial vertebral arteries are patent with mild atherosclerotic irregularity but no significant stenosis. The basilar artery is widely patent. There are small right and large left posterior communicating arteries with hypoplasia of the left P1 segment. Both PCAs are patent without evidence of a significant proximal stenosis on the right. There are mild to moderate left P2 stenoses. No aneurysm is identified. Venous sinuses: As permitted by contrast timing, patent. Anatomic variants: Fetal left PCA. Review of the MIP images confirms the above findings IMPRESSION: Mild atherosclerosis in the head and neck without a large vessel occlusion or high-grade proximal stenosis. Electronically Signed   By: Logan Bores M.D.   On: 10/20/2022 14:35   MR BRAIN WO CONTRAST  Result Date: 10/20/2022 CLINICAL DATA:  Provided history: Neuro deficit, acute, stroke suspected. Double vision. EXAM: MRI HEAD WITHOUT CONTRAST TECHNIQUE: Multiplanar, multiecho pulse sequences of the brain and surrounding structures were obtained without intravenous contrast. COMPARISON:  Head CT 03/07/2021.  Brain MRI 04/13/2019. FINDINGS: Brain: Mild-to-moderate generalized cerebral atrophy. 2 mm acute infarct within the right corona radiata (series 2, image 32). Multifocal T2 FLAIR hyperintense signal abnormality within the bilateral cerebral white matter, basal ganglia and pons, overall moderate in severity. These signal changes are nonspecific, but compatible with chronic small vessel ischemic disease. Small chronic infarct within the left cerebellar hemisphere, new from the prior brain MRI of 04/01/2019. No evidence of an intracranial mass. No chronic intracranial blood products. No extra-axial fluid collection. No midline shift. Vascular: Maintained flow voids within the proximal  large arterial vessels. Skull and upper cervical spine: No focal suspicious marrow lesion. Sinuses/Orbits: No mass or acute finding within the imaged orbits. Prior bilateral ocular lens replacement. No significant paranasal sinus disease. Other: Small-volume fluid within the left mastoid air cells. IMPRESSION: 1. 2 mm acute infarct within the right corona radiata. 2. Chronic small-vessel ischemic changes within the cerebral white matter, basal ganglia and pons. Findings are overall moderate in severity and slightly progressed from the prior MRI of 04/01/2019. 3. Small chronic infarct within the left cerebellar hemisphere, new from the prior MRI. 4. Mild-to-moderate generalized cerebral atrophy. 5. Small left mastoid effusion. Electronically Signed   By: Kellie Simmering D.O.   On: 10/20/2022 14:00    Pending Labs Unresulted Labs (From admission, onward)     Start     Ordered   10/20/22 1537  Hemoglobin A1c  (Labs)  Once,   R       Comments: To assess prior glycemic control    10/20/22 1539            Vitals/Pain Today's Vitals   10/20/22 1615 10/20/22 1630 10/20/22 1645 10/20/22 1700  BP: (!) 123/92 137/67 128/78 (!) 126/54  Pulse: 78 80 81 81  Resp: '18 17 16 17  '$ Temp:      TempSrc:      SpO2: 98% 99% 99% 99%  PainSc:        Isolation Precautions No active isolations  Medications Medications  sodium chloride flush (NS) 0.9 % injection 3 mL (3 mLs Intravenous Not Given 10/20/22  1240)   stroke: early stages of recovery book (has no administration in time range)  0.9 %  sodium chloride infusion ( Intravenous New Bag/Given 10/20/22 1619)  acetaminophen (TYLENOL) tablet 650 mg (has no administration in time range)    Or  acetaminophen (TYLENOL) 160 MG/5ML solution 650 mg (has no administration in time range)    Or  acetaminophen (TYLENOL) suppository 650 mg (has no administration in time range)  senna-docusate (Senokot-S) tablet 1 tablet (has no administration in time range)   enoxaparin (LOVENOX) injection 40 mg (40 mg Subcutaneous Given 10/20/22 1621)  aspirin EC tablet 81 mg (81 mg Oral Given 10/20/22 1701)  clopidogrel (PLAVIX) tablet 75 mg (75 mg Oral Given 10/20/22 1701)  iohexol (OMNIPAQUE) 350 MG/ML injection 75 mL (75 mLs Intravenous Contrast Given 10/20/22 1417)    Mobility walks     Focused Assessments Neuro Assessment Handoff:  Swallow screen pass? Yes    NIH Stroke Scale  Level of Consciousness (1a.)   : Morris, keenly responsive LOC Questions (1b. )   : Answers both questions correctly LOC Commands (1c. )   : Performs both tasks correctly Best Gaze (2. )  : Normal Visual (3. )  : No visual loss Facial Palsy (4. )    : Normal symmetrical movements Motor Arm, Left (5a. )   : No drift Motor Arm, Right (5b. ) : No drift Motor Leg, Left (6a. )  : No drift Motor Leg, Right (6b. ) : No drift Limb Ataxia (7. ): Absent Sensory (8. )  : Normal, no sensory loss Best Language (9. )  : No aphasia Dysarthria (10. ): Normal Extinction/Inattention (11.)   : No Abnormality Complete NIHSS TOTAL: 0     Neuro Assessment: Exceptions to WDL Neuro Checks:      Has TPA been given? No If patient is a Neuro Trauma and patient is going to OR before floor call report to [facility] nurse: [phone] or [phone]   R Recommendations: See Admitting Provider Note  Report given to:   Additional Notes:   Patient Morris and oriented x4, RA  NIH of 0 Currently  Patient ambulated

## 2022-10-20 NOTE — H&P (Signed)
History and Physical    Patient: Jillian Morris [id number] DOB: Sep 03, 1945 DOA: 10/20/2022 DOS: the patient was seen and examined on 10/20/2022 PCP: Shirline Frees, MD  Patient coming from: Home  Chief Complaint:  Chief Complaint  Patient presents with   Eye Problem   HPI: Jillian Morris is a 77 y.o. female with medical history significant of hypertension, hyperlipidemia,h/o subarachnoid hemorrhage, Sjogren's disease, dysphagia, Graves' disease, bipolar disorder, chronic back pain, fibromyalgia, and GERD presents with concerns of changes in her vision.  Symptoms initially started 2 nights ago with reports of double vision lasted less than half an hour, but thereafter she reported having fuzziness of the visual field in the left eye that seem to persist.  She reports having numbness and tingling sensation right hand more so than her left, but states that she thought symptoms were likely secondary to possible nerve impingement as she needs to shoulders replaced.  She called her ophthalmologist continue with vision changes and was seen and advised to come to the hospital for further evaluation.  Patient does report that she intermittently has palpitations where she feels her heart racing.  Reports wearing Holter monitor previously.  In emergency department patient was noted to be afebrile with blood pressures 152/82, and all other vital signs maintained.  Labs relatively unremarkable.  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  CTA of the head and neck did not note any large vessel occlusion.  Neurology has been consulted and recommended patient for completion of stroke workup.   Review of Systems: As mentioned in the history of present illness. All other systems reviewed and are negative. Past Medical History:  Diagnosis Date   Abnormal gait    Achalasia  08/09/2013   Age-related osteoporosis without current pathological fracture 06/29/2017   Allergic rhinitis    Arthritis    Bipolar II disorder ([place]) 04/28/2018   Chronic kidney disease, stage 3b    Chronic otitis externa    Closed fracture of right distal radius    Closed fracture of zygomatic arch    DDD (degenerative disc disease), cervical 06/29/2017   DDD (degenerative disc disease), lumbar 06/29/2017   s/p laminectomy    Decreased estrogen level    Dysphagia    due to esophageal narrowing, able to take meds with thin liquid sif necessary    Essential hypertension    was briefly on losartan back early 2020 , had a severe reaction and was hospitalized , losartan was dc'd and she report shas not been prescribed any new bp meds as of yet, denies lasting issues from hospitalization    Essential tremor 06/29/2017   Fibromyalgia    Frequent falls    last fall was 03-09-2019 , my knee gave out. other times i feel like its vertigo . like a spacey feeling " . 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   Hypothyroidism    IBS (irritable bowel syndrome)    resolved with use of oxymorhpne    Insomnia 07/10/2018   Iron deficiency anemia  s/p gastric bypass, malabsorption   Joint pain    Lymphedema    Major depressive disorder    Meralgia paraesthetica    Migraine    Mild cognitive impairment of uncertain or unknown etiology 08/03/2019   Pedal edema    Pharyngeal dysphagia    Primary osteoarthritis of left knee 06/29/2017   Pure hypercholesterolemia    Restless legs    Right wrist fracture 04/20/2016   Rosacea    S/P dilation of esophageal narrowing    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   Sjogren's disease  06/29/2017   Stricture and stenosis of esophagus 03/30/2012   Traumatic subarachnoid bleed with LOC of 1 hour to 5 hours 59 minutes 04/16/2016   Vitamin B12 deficiency    Past Surgical History:  Procedure Laterality Date   ABDOMINAL HYSTERECTOMY     APPENDECTOMY     AUGMENTATION MAMMAPLASTY Bilateral    in her 40's pt had a reaction to her implants, she had them removed   BALLOON DILATION  03/30/2012   Procedure: BALLOON DILATION;  Surgeon: Inda Castle, MD;  Location: [facility];  Service: Endoscopy;  Laterality: N/A;   BOTOX INJECTION N/A 12/26/2014   Procedure: BOTOX INJECTION;  Surgeon: Inda Castle, MD;  Location: [facility];  Service: Endoscopy;  Laterality: N/A;   BOTOX INJECTION  11/12/2020   Procedure: BOTOX INJECTION;  Surgeon: Wilford Corner, MD;  Location: [facility];  Service: Endoscopy;;   ENDOMETRIAL ABLATION     x 4   ESOPHAGEAL MANOMETRY N/A 05/15/2013   Procedure: ESOPHAGEAL MANOMETRY (EM);  Surgeon: Inda Castle, MD;  Location: [facility];  Service: Endoscopy;  Laterality: N/A;   ESOPHAGOGASTRODUODENOSCOPY  03/30/2012   Procedure: ESOPHAGOGASTRODUODENOSCOPY (EGD);  Surgeon: Inda Castle, MD;  Location: [facility];  Service: Endoscopy;  Laterality: N/A;   ESOPHAGOGASTRODUODENOSCOPY  05/04/2012   Procedure: ESOPHAGOGASTRODUODENOSCOPY (EGD);  Surgeon: Inda Castle, MD;  Location: [facility];  Service: Endoscopy;  Laterality: N/A;   ESOPHAGOGASTRODUODENOSCOPY N/A 12/26/2014   Procedure: ESOPHAGOGASTRODUODENOSCOPY (EGD);  Surgeon: Inda Castle, MD;  Location: [facility];  Service: Endoscopy;  Laterality: N/A;  botox injection   ESOPHAGOGASTRODUODENOSCOPY (EGD) WITH PROPOFOL N/A 11/12/2020   Procedure: ESOPHAGOGASTRODUODENOSCOPY (EGD) WITH PROPOFOL;  Surgeon: Wilford Corner, MD;  Location: [facility];  Service: Endoscopy;  Laterality: N/A;   GASTRIC BYPASS     JOINT REPLACEMENT     right knee    KNEE ARTHROPLASTY     LAMINECTOMY     L3-L5    OPEN REDUCTION INTERNAL FIXATION (ORIF) DISTAL RADIAL FRACTURE Right 04/23/2016   Procedure: OPEN REDUCTION INTERNAL FIXATION (ORIF)  RIGHT DISTAL RADIAL FRACTURE;  Surgeon: Leanora Cover, MD;  Location: [facility];  Service: Orthopedics;  Laterality: Right;   ORIF TRIPOD FRACTURE Right 04/19/2016   Procedure: OPEN REDUCTION INTERNAL FIXATION (ORIF) TRIPOD FRACTURE LIEBINGER MIDFACE NO DRILL;  Surgeon: Leta Baptist, MD;  Location: [facility];  Service: ENT;  Laterality: Right;   REPLACEMENT TOTAL KNEE  01/2011   right   THYROIDECTOMY     TOTAL KNEE ARTHROPLASTY Left 03/13/2019   Procedure: TOTAL KNEE ARTHROPLASTY;  Surgeon: Vickey Huger, MD;  Location: [facility];  Service: Orthopedics;  Laterality: Left;   Social History:  reports that she has never smoked. She has never used smokeless tobacco. She reports that she does not drink alcohol and does not use drugs.  Allergies  Allergen Reactions   Losartan Rash   Actonel [Risedronate Sodium]     GI Upset   Ambien [Zolpidem  Tartrate] Other (See Comments)    Sleep-walking, hallucinations   Depakote [Divalproex Sodium]     Hallucinations   Inderal [Propranolol] Other (See Comments)    Hallucinations    Nsaids Other (See Comments)    Pt. Had gastric bypass surgery    Penicillins Other (See Comments)    GI upset. Did it involve swelling of the face/tongue/throat, SOB, or low BP? No Did it involve sudden or severe rash/hives, skin peeling, or any reaction on the inside of your mouth or nose? No Did you need to seek medical attention at a hospital or doctor's office? No When did it last happen?    As a child If all above answers are "NO", may proceed with cephalosporin use.     Silicon Nausea And Vomiting    Thyroid storm   Adhesive [Tape] Itching and Rash    Please use "paper" tape   Cefuroxime Axetil Rash   Latex Swelling and Rash    Redness lips   Nickel Itching and Rash    Contact Dermatitis (intolerance)    Family History  Problem Relation Age of  Onset   Prostate cancer Maternal Grandfather    Lymphoma Maternal Uncle    Heart disease Father    Heart disease Sister    Irritable bowel syndrome Sister    Dementia Mother    Stroke Maternal Grandmother    Appendicitis Paternal Grandmother    Colon cancer Neg Hx     Prior to Admission medications   Medication Sig Start Date End Date Taking? Authorizing Provider  ALPRAZolam Duanne Moron) 0.5 MG tablet Take 0.5 mg by mouth 2 (two) times daily as needed for anxiety.    [provider]  amLODipine (NORVASC) 2.5 MG tablet Take 2.5 mg by mouth daily. 05/05/19   [provider]  carboxymethylcellulose (REFRESH PLUS) 0.5 % SOLN Place 1 drop into both eyes 3 (three) times daily as needed (dry eyes).    [provider]  DULoxetine (CYMBALTA) 60 MG capsule TAKE 2 CAPSULES (120 MG TOTAL) BY MOUTH EVERY EVENING. 07/17/19   Cottle, Billey Co., MD  ferrous sulfate 325 (65 FE) MG tablet Take 325 mg by mouth daily with breakfast.    [provider]  HYDROmorphone (DILAUDID) 2 MG tablet Take 1 tablet (2 mg total) by mouth 3 (three) times daily as needed  for pain 10/14/22     lamoTRIgine (LAMICTAL) 100 MG tablet TAKE 3 TABLETS (300 MG TOTAL) BY MOUTH AT BEDTIME. 07/06/19   Cottle, Billey Co., MD  levothyroxine (SYNTHROID) 125 MCG tablet Take 125 mcg by mouth daily before breakfast.    [provider]  LORazepam (ATIVAN) 1 MG tablet Take 1 mg by mouth at bedtime.    [provider]  meclizine (ANTIVERT) 12.5 MG tablet Take 12.5 mg by mouth 3 (three) times daily as needed for dizziness.    [provider]  omeprazole (PRILOSEC) 40 MG capsule Take 40 mg by mouth in the morning and at bedtime. 09/13/20   [provider]  oxymetazoline (VICKS SINEX) 0.05 % nasal spray Place 1 spray into both nostrils 2 (two) times daily as needed for congestion.    [provider]  oxymorphone (OPANA) 5 MG tablet Take 5 mg by mouth every 6 (six) hours as  needed for pain. 05/05/19   [provider]  Probiotic Product (PROBIOTIC DAILY PO) Take 2 capsules by mouth daily.    [provider]  rOPINIRole (REQUIP) 1 MG tablet Take  1 mg by mouth at bedtime as needed (restless leg syndrome).  02/22/16   [provider]    Physical Exam: Vitals:   10/20/22 1218 10/20/22 1509  BP: (!) 152/82   Pulse: 84   Resp: 18   Temp: 97.8 F (36.6 C) 97.9 F (36.6 C)  TempSrc:  Oral  SpO2: 98%    Constitutional: Elderly female who is in no acute distress Eyes: Extraocular movements appear to be intact.  Lids and conjunctivae normal ENMT: Mucous membranes are moist.  Normal dentition.  Neck: normal, supple  Respiratory: clear to auscultation bilaterally, no wheezing, no crackles. Normal respiratory effort. No accessory muscle use.  Cardiovascular: Regular rate and rhythm, no murmurs / rubs / gallops.  Lymphedema present on the lower extremities Abdomen: no tenderness, no masses palpated. No hepatosplenomegaly. Bowel sounds positive.  Musculoskeletal: no clubbing / cyanosis.  Decreased range of motion of the right shoulder more so than the left. Skin: no rashes, lesions, ulcers. No induration Neurologic: CN 2-12 grossly intact.  Strength 5/5 in all 4.  Psychiatric: Normal judgment and insight. Alert and oriented x 3. Normal mood.   Data Reviewed:  Reviewed labs, imaging, and pertinent records as  Assessment and Plan: CVA Acute.  Patient present with complaints of double vision along with visual disturbance out of her left eye days ago.  MRI noted a 2 mm infarct in the right corona radiata and small chronic infarct of the left hemisphere from prior MRI.  Incidental stroke did not correlate with patient's symptoms of presentation. -Admit to a telemetry bed -Neurochecks -Follow-up hemoglobin A1c and lipid panel -Check echocardiogram -Aspirin 81 mg p.o. and Plavix 75 mg p.o. for 21 days and continue aspirin monotherapy -Follow-up  telemetry overnight -Appreciate neurology consultative services we will follow-up for any further recommendations  Essential hypertension Blood pressures currently maintained.  Patient out of the permissive hypertensive.   -Continue amlodipine  Hypothyroidism -Check TSH -Continue brand levothyroxine  Fibromyalgia -Continue Cymbalta and Dilaudid as needed for pain  Anxiety Bipolar II disorder -Lamictal and Xanax as needed  Mild cognitive impairment -Delirium precautions  GERD History of hiatal hernia -Continue with substitution of Protonix  DVT prophylaxis: Lovenox Advance Care Planning:   Code Status: Full Code  Consults: Neurology  Family Communication: Husband updated at bedside Severity of Illness: The appropriate patient status for this patient is OBSERVATION. Observation status is judged to be reasonable and necessary in order to provide the required intensity of service to ensure the patient's safety. The patient's presenting symptoms, physical exam findings, and initial radiographic and laboratory data in the context of their medical condition is felt to place them at decreased risk for further clinical deterioration. Furthermore, it is anticipated that the patient will be medically stable for discharge from the hospital within 2 midnights of admission.   Author: Norval Morton, MD 10/20/2022 3:24 PM  For on call review [email or web address].

## 2022-10-20 NOTE — ED Triage Notes (Signed)
Pt had twenty minute episode of double vision in L eye on Sunday night and has had blurry vision after the double vision resolved. Had a mild headache at the time of onset of double vision but that has now subsided as well.

## 2022-10-20 NOTE — ED Notes (Signed)
Attempt x 2 for IV for CT without success

## 2022-10-20 NOTE — ED Provider Notes (Signed)
[facility] [facility] Provider Note   CSN: [id number] Arrival date & time: 10/20/22  1210     History  Chief Complaint  Patient presents with   Eye Problem    Jillian Morris is a 77 y.o. female.   Eye Problem    Patient presents due to left eye diplopia.  Is been going on mainly for the past 2 days.  She is currently having intermittent blurry vision to that side.  Denies any lateralized weakness numbness, headache, loss of consciousness.  No history of TIA, CVA, heart attack or PE.  She is not anticoagulated.  Home Medications Prior to Admission medications   Medication Sig Start Date End Date Taking? Authorizing Provider  ALPRAZolam Duanne Moron) 0.5 MG tablet Take 0.5 mg by mouth 2 (two) times daily as needed for anxiety.    [provider]  amLODipine (NORVASC) 2.5 MG tablet Take 2.5 mg by mouth daily. 05/05/19   [provider]  carboxymethylcellulose (REFRESH PLUS) 0.5 % SOLN Place 1 drop into both eyes 3 (three) times daily as needed (dry eyes).    [provider]  DULoxetine (CYMBALTA) 60 MG capsule TAKE 2 CAPSULES (120 MG TOTAL) BY MOUTH EVERY EVENING. 07/17/19   Cottle, Billey Co., MD  ferrous sulfate 325 (65 FE) MG tablet Take 325 mg by mouth daily with breakfast.    [provider]  HYDROmorphone (DILAUDID) 2 MG tablet Take 1 tablet (2 mg total) by mouth 3 (three) times daily as needed  for pain 10/14/22     lamoTRIgine (LAMICTAL) 100 MG tablet TAKE 3 TABLETS (300 MG TOTAL) BY MOUTH AT BEDTIME. 07/06/19   Cottle, Billey Co., MD  levothyroxine (SYNTHROID) 125 MCG tablet Take 125 mcg by mouth daily before breakfast.    [provider]  LORazepam (ATIVAN) 1 MG tablet Take 1 mg by mouth at bedtime.    [provider]  meclizine (ANTIVERT) 12.5 MG tablet Take 12.5 mg by mouth 3 (three) times daily as needed for dizziness.    [provider]  omeprazole (PRILOSEC) 40 MG capsule Take  40 mg by mouth in the morning and at bedtime. 09/13/20   [provider]  oxymetazoline (VICKS SINEX) 0.05 % nasal spray Place 1 spray into both nostrils 2 (two) times daily as needed for congestion.    [provider]  oxymorphone (OPANA) 5 MG tablet Take 5 mg by mouth every 6 (six) hours as needed for pain. 05/05/19   [provider]  Probiotic Product (PROBIOTIC DAILY PO) Take 2 capsules by mouth daily.    [provider]  rOPINIRole (REQUIP) 1 MG tablet Take 1 mg by mouth at bedtime as needed (restless leg syndrome).  02/22/16   [provider]      Allergies    Losartan, Actonel [risedronate sodium], Ambien [zolpidem tartrate], Depakote [divalproex sodium], Inderal [propranolol], Nsaids, Penicillins, Silicon, Adhesive [tape], Cefuroxime axetil, Latex, and Nickel    Review of Systems   Review of Systems  Physical Exam Updated Vital Signs BP (!) 126/54   Pulse 81   Temp 97.9 F (36.6 C) (Oral)   Resp 17   SpO2 99%  Physical Exam Vitals and nursing note reviewed. Exam conducted with a chaperone present.  Constitutional:      Appearance: Normal appearance.  HENT:     Head: Normocephalic and atraumatic.  Eyes:     General: No scleral icterus.       Right  eye: No discharge.        Left eye: No discharge.     Extraocular Movements: Extraocular movements intact.     Pupils: Pupils are equal, round, and reactive to light.  Cardiovascular:     Rate and Rhythm: Normal rate and regular rhythm.     Pulses: Normal pulses.     Heart sounds: Normal heart sounds. No murmur heard.    No friction rub. No gallop.  Pulmonary:     Effort: Pulmonary effort is normal. No respiratory distress.     Breath sounds: Normal breath sounds.  Abdominal:     General: Abdomen is flat. Bowel sounds are normal. There is no distension.     Palpations: Abdomen is soft.     Tenderness: There is no abdominal tenderness.  Skin:    General: Skin is warm and dry.      Coloration: Skin is not jaundiced.  Neurological:     Mental Status: She is alert. Mental status is at baseline.     Coordination: Coordination normal.     ED Results / Procedures / Treatments   Labs (all labs ordered are listed, but only abnormal results are displayed) Labs Reviewed  CBC - Abnormal; Notable for the following components:      Result Value   MCV 101.2 (*)    All other components within normal limits  COMPREHENSIVE METABOLIC PANEL - Abnormal; Notable for the following components:   Creatinine, Ser 1.07 (*)    Total Protein 6.1 (*)    GFR, Estimated 53 (*)    All other components within normal limits  LIPID PANEL - Abnormal; Notable for the following components:   Cholesterol 213 (*)    LDL Cholesterol 122 (*)    All other components within normal limits  PROTIME-INR  APTT  DIFFERENTIAL  ETHANOL  HEMOGLOBIN A1C  RAPID URINE DRUG SCREEN, HOSP PERFORMED  I-STAT CHEM 8, ED  CBG MONITORING, ED    EKG None  Radiology CT ANGIO HEAD NECK W WO CM  Result Date: 10/20/2022 CLINICAL DATA:  Neuro deficit, acute, stroke suspected.  Diplopia. EXAM: CT ANGIOGRAPHY HEAD AND NECK TECHNIQUE: Multidetector CT imaging of the head and neck was performed using the standard protocol during bolus administration of intravenous contrast. Multiplanar CT image reconstructions and MIPs were obtained to evaluate the vascular anatomy. Carotid stenosis measurements (when applicable) are obtained utilizing NASCET criteria, using the distal internal carotid diameter as the denominator. RADIATION DOSE REDUCTION: This exam was performed according to the departmental dose-optimization program which includes automated exposure control, adjustment of the mA and/or kV according to patient size and/or use of iterative reconstruction technique. CONTRAST:  7m OMNIPAQUE IOHEXOL 350 MG/ML SOLN COMPARISON:  Head MRI 10/20/2022 FINDINGS: CT HEAD FINDINGS Brain: There is no evidence of an acute large  territory infarct, intracranial hemorrhage, mass, midline shift, or extra-axial fluid collection. The punctate acute infarct in the right corona radiata on MRI is occult by CT. Patchy hypodensities in the cerebral white matter and pons are nonspecific but compatible with moderate chronic small vessel ischemic disease as described on MRI. A tiny chronic left cerebellar infarct is again noted. Cerebral atrophy is mild for age. Vascular: Calcified atherosclerosis at the skull base. Skull: No fracture or suspicious osseous lesion. Sinuses/Orbits: Paranasal sinuses and mastoid air cells are clear. Bilateral cataract extraction. Other: None. Review of the MIP images confirms the above findings CTA NECK FINDINGS Aortic arch: The aortic arch and proximal arch vessels were not imaged. Right  carotid system: Patent with a small amount of calcified and soft plaque at the carotid bifurcation. No evidence of a significant stenosis or dissection. Left carotid system: Exclusion of the proximal common carotid artery, otherwise patent without evidence stenosis, dissection, or significant atherosclerosis. Vertebral arteries: Patent and codominant with limited assessment of the proximal V1 segments due to motion and shoulder artifact. No evidence of a significant stenosis or dissection more distally. Skeleton: Advanced disc and facet degeneration in the cervical spine. Other neck: No evidence of cervical lymphadenopathy or mass. Upper chest: No mass or consolidation in the included lung apices. Review of the MIP images confirms the above findings CTA HEAD FINDINGS Anterior circulation: The internal carotid arteries are patent from skull base to carotid termini with mild atherosclerotic plaque bilaterally not resulting in significant stenosis. ACAs and MCAs are patent with mild atherosclerotic irregularity but no evidence of a proximal branch occlusion or high-grade proximal stenosis. No aneurysm is identified. Posterior circulation:  The intracranial vertebral arteries are patent with mild atherosclerotic irregularity but no significant stenosis. The basilar artery is widely patent. There are small right and large left posterior communicating arteries with hypoplasia of the left P1 segment. Both PCAs are patent without evidence of a significant proximal stenosis on the right. There are mild to moderate left P2 stenoses. No aneurysm is identified. Venous sinuses: As permitted by contrast timing, patent. Anatomic variants: Fetal left PCA. Review of the MIP images confirms the above findings IMPRESSION: Mild atherosclerosis in the head and neck without a large vessel occlusion or high-grade proximal stenosis. Electronically Signed   By: Logan Bores M.D.   On: 10/20/2022 14:35   MR BRAIN WO CONTRAST  Result Date: 10/20/2022 CLINICAL DATA:  Provided history: Neuro deficit, acute, stroke suspected. Double vision. EXAM: MRI HEAD WITHOUT CONTRAST TECHNIQUE: Multiplanar, multiecho pulse sequences of the brain and surrounding structures were obtained without intravenous contrast. COMPARISON:  Head CT 03/07/2021.  Brain MRI 04/13/2019. FINDINGS: Brain: Mild-to-moderate generalized cerebral atrophy. 2 mm acute infarct within the right corona radiata (series 2, image 32). Multifocal T2 FLAIR hyperintense signal abnormality within the bilateral cerebral white matter, basal ganglia and pons, overall moderate in severity. These signal changes are nonspecific, but compatible with chronic small vessel ischemic disease. Small chronic infarct within the left cerebellar hemisphere, new from the prior brain MRI of 04/01/2019. No evidence of an intracranial mass. No chronic intracranial blood products. No extra-axial fluid collection. No midline shift. Vascular: Maintained flow voids within the proximal large arterial vessels. Skull and upper cervical spine: No focal suspicious marrow lesion. Sinuses/Orbits: No mass or acute finding within the imaged orbits.  Prior bilateral ocular lens replacement. No significant paranasal sinus disease. Other: Small-volume fluid within the left mastoid air cells. IMPRESSION: 1. 2 mm acute infarct within the right corona radiata. 2. Chronic small-vessel ischemic changes within the cerebral white matter, basal ganglia and pons. Findings are overall moderate in severity and slightly progressed from the prior MRI of 04/01/2019. 3. Small chronic infarct within the left cerebellar hemisphere, new from the prior MRI. 4. Mild-to-moderate generalized cerebral atrophy. 5. Small left mastoid effusion. Electronically Signed   By: Kellie Simmering D.O.   On: 10/20/2022 14:00    Procedures Procedures    Medications Ordered in ED Medications  sodium chloride flush (NS) 0.9 % injection 3 mL (3 mLs Intravenous Not Given 10/20/22 1240)   stroke: early stages of recovery book (has no administration in time range)  0.9 %  sodium chloride infusion (  Intravenous New Bag/Given 10/20/22 1619)  acetaminophen (TYLENOL) tablet 650 mg (has no administration in time range)    Or  acetaminophen (TYLENOL) 160 MG/5ML solution 650 mg (has no administration in time range)    Or  acetaminophen (TYLENOL) suppository 650 mg (has no administration in time range)  senna-docusate (Senokot-S) tablet 1 tablet (has no administration in time range)  enoxaparin (LOVENOX) injection 40 mg (40 mg Subcutaneous Given 10/20/22 1621)  aspirin EC tablet 81 mg (81 mg Oral Given 10/20/22 1701)  clopidogrel (PLAVIX) tablet 75 mg (75 mg Oral Given 10/20/22 1701)  iohexol (OMNIPAQUE) 350 MG/ML injection 75 mL (75 mLs Intravenous Contrast Given 10/20/22 1417)    ED Course/ Medical Decision Making/ A&P                             Medical Decision Making Risk Decision regarding hospitalization.   Patient presents to the emergency department due to blurry vision in left eye and diplopia.  Differential includes not limited to stroke/TIA, optic neuritis  Patient's neuroexam  is not particularly remarkable, I do not appreciate any focal or lateralized weakness.  Neurology was consulted in triage and they recommended MRI brain with CTA head and neck which were ordered.    Patient has an acute infarct, I consulted neurology who request admission to medicine for further workup.  Hospitalist consulted and agrees with admission.        Final Clinical Impression(s) / ED Diagnoses Final diagnoses:  TIA (transient ischemic attack)    Rx / DC Orders ED Discharge Orders     None         Sherrill Raring, Hershal Coria 10/20/22 1759    Lacretia Leigh, MD 10/22/22 [phone]

## 2022-10-21 ENCOUNTER — Observation Stay (HOSPITAL_BASED_OUTPATIENT_CLINIC_OR_DEPARTMENT_OTHER): Payer: Medicare Other

## 2022-10-21 DIAGNOSIS — I6389 Other cerebral infarction: Secondary | ICD-10-CM | POA: Diagnosis not present

## 2022-10-21 DIAGNOSIS — I639 Cerebral infarction, unspecified: Secondary | ICD-10-CM | POA: Diagnosis not present

## 2022-10-21 DIAGNOSIS — E785 Hyperlipidemia, unspecified: Secondary | ICD-10-CM

## 2022-10-21 LAB — ECHOCARDIOGRAM COMPLETE
AR max vel: 2.55 cm2
AV Area VTI: 2.43 cm2
AV Area mean vel: 2.42 cm2
AV Mean grad: 5 mmHg
AV Peak grad: 8.9 mmHg
Ao pk vel: 1.49 m/s
Area-P 1/2: 3.77 cm2
S' Lateral: 2.1 cm

## 2022-10-21 MED ORDER — ASPIRIN 81 MG PO TBEC: 81.0000 mg | DELAYED_RELEASE_TABLET | Freq: Every day | ORAL | 12 refills | Status: DC

## 2022-10-21 MED ORDER — ATORVASTATIN CALCIUM 80 MG PO TABS: 80.0000 mg | ORAL_TABLET | Freq: Every day | ORAL | 3 refills | Status: AC

## 2022-10-21 MED ORDER — CLOPIDOGREL BISULFATE 75 MG PO TABS: 75.0000 mg | ORAL_TABLET | Freq: Every day | ORAL | 0 refills | Status: AC

## 2022-10-21 MED ORDER — ATORVASTATIN CALCIUM 80 MG PO TABS
80.0000 mg | ORAL_TABLET | Freq: Every day | ORAL | Status: DC
  Administered 2022-10-21: 80 mg via ORAL
  Filled 2022-10-21: qty 1

## 2022-10-21 NOTE — Plan of Care (Signed)
  Problem: Education: Goal: Knowledge of disease or condition will improve 10/21/2022 1633 by Monica Becton, RN Outcome: Adequate for Discharge 10/21/2022 1552 by Monica Becton, RN Outcome: Progressing Goal: Knowledge of secondary prevention will improve (MUST DOCUMENT ALL) 10/21/2022 1633 by Monica Becton, RN Outcome: Adequate for Discharge 10/21/2022 1552 by Monica Becton, RN Outcome: Progressing Goal: Knowledge of patient specific risk factors will improve Elta Gu[place] N/A or DELETE if not current risk factor) 10/21/2022 1633 by Monica Becton, RN Outcome: Adequate for Discharge 10/21/2022 1552 by Monica Becton, RN Outcome: Progressing Discharging to home

## 2022-10-21 NOTE — Progress Notes (Addendum)
STROKE TEAM PROGRESS NOTE   INTERVAL HISTORY No family at the bedside. She states she had a 20 minute episode of diplopia and blurry vision which have since cleared up and have not recurred.  VSS. We are recommending ASA/Plavix for 3 weeks than ASA alone, MRI brain with acute right corona radiata ischemic infarct which does not explain her symptoms so also likely a  brain stem TIA due to small vessel disease   Vitals:   10/20/22 2358 10/21/22 0314 10/21/22 0732 10/21/22 1204  BP: 128/70 125/65 124/63 134/72  Pulse: 81 81 75 82  Resp: '16 16 16 18  '$ Temp: 98.2 F (36.8 C) 98.5 F (36.9 C) 97.7 F (36.5 C) 98.1 F (36.7 C)  TempSrc: Oral Oral Oral Oral  SpO2: 97% 95% 97% 96%   CBC:  Recent Labs  Lab 10/20/22 1243 10/20/22 1252  WBC 9.2  --   NEUTROABS 4.9  --   HGB 13.6 13.9  HCT 40.8 41.0  MCV 101.2*  --   PLT 233  --    Basic Metabolic Panel:  Recent Labs  Lab 10/20/22 1243 10/20/22 1252  NA 138 141  K 3.5 3.6  CL 107 109  CO2 22  --   GLUCOSE 94 94  BUN 14 16  CREATININE 1.07* 1.00  CALCIUM 9.0  --    Lipid Panel:  Recent Labs  Lab 10/20/22 1622  CHOL 213*  TRIG 95  HDL 72  CHOLHDL 3.0  VLDL 19  LDLCALC 122*   HgbA1c: No results for input(s): "HGBA1C" in the last 168 hours. Urine Drug Screen:  Recent Labs  Lab 10/20/22 1800  LABOPIA POSITIVE*  COCAINSCRNUR NONE DETECTED  LABBENZ POSITIVE*  AMPHETMU NONE DETECTED  THCU NONE DETECTED  LABBARB NONE DETECTED    Alcohol Level  Recent Labs  Lab 10/20/22 1243  ETH <10    IMAGING past 24 hours No results found.  PHYSICAL EXAM  Temp:  [97.5 F (36.4 C)-98.5 F (36.9 C)] 98.1 F (36.7 C) (02/28 1204) Pulse Rate:  [75-84] 82 (02/28 1204) Resp:  [15-22] 18 (02/28 1204) BP: (122-140)/(54-92) 134/72 (02/28 1204) SpO2:  [95 %-100 %] 96 % (02/28 1204)  General - Well nourished, well developed, in no apparent distress. Cardiovascular - Regular rhythm and rate.  Mental Status -  Level of  arousal and orientation to time, place, and person were intact. Language including expression, naming, repetition, comprehension was assessed and found intact. Attention span and concentration were normal. Recent and remote memory were intact. Fund of Knowledge was assessed and was intact.  Cranial Nerves II - XII - II - Visual field intact OU. III, IV, VI - Extraocular movements intact. V - Facial sensation intact bilaterally. VII - Facial movement intact bilaterally. VIII - Hearing & vestibular intact bilaterally. X - Palate elevates symmetrically. XI - Chin turning & shoulder shrug intact bilaterally. XII - Tongue protrusion intact.  Motor Strength - The patient's strength was normal in all extremities and pronator drift was absent.  Bulk was normal and fasciculations were absent.   Motor Tone - Muscle tone was assessed at the neck and appendages and was normal.  Sensory - Light touch, temperature/pinprick were assessed and were symmetrical.    Coordination - The patient had normal movements in the hands and feet with no ataxia or dysmetria.  Tremor was absent.  Gait and Station - deferred.  ASSESSMENT/PLAN Jillian Morris is a 77 y.o. female with history of  CKD stage IIIb,  bipolar II disorder, dysphagia secondary to esophageal narrowing s/p esophageal dilation, frequent falls, history of TIA, history of gastric bypass with subsequent B12 deficiency and iron deficiency anemia, GAD, depression, cervical DDD, lumbar DDD s/p laminectomy, mild cognitive impairment (described as mild memory issues), Graves' disease, RLS, hypercholesterolemia, Sjogren's disease, traumatic SAH in 2017, lymphedema, and meralgia paresthetica who presented to the ED on 10/20/2022 after being sent by her ophthalmologist.  Patient states that on Sunday evening, 10/18/2022, she had a 20 minute episode of transient diplopia followed by blurred vision.  At the time of diplopia onset, the patient also noted right >  left fingertip numbness and tingling and a right posterior mild headache with quick resolution.    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 MRI  2 mm acute infarct within the right corona radiata.  2D Echo ordered LDL 122 HgbA1c 5.3 VTE prophylaxis - Lovenox    Diet   Diet Heart Room service appropriate? Yes; Fluid consistency: Thin   No antithrombotic prior to admission, now on aspirin 81 mg daily and clopidogrel 75 mg daily. For 3 weeks than ASA alone  Therapy recommendations:  pending Disposition:  pending  Hypertension Home meds:  norvasc 2.'5mg'$ , Stable Permissive hypertension (OK if < 220/120) but gradually normalize in 5-7 days Long-term BP goal normotensive  Hyperlipidemia Home meds:  none, resumed in hospital LDL 122, goal < 70 Add atorvastatin '80mg'$    Continue statin at discharge   Other Active Problems Hypothyroidism [facility] day # 0  Beulah Gandy DNP, ACNPC-AG  [facility]  STROKE MD NOTE :  I have personally obtained history,examined this patient, reviewed notes, independently viewed imaging studies, participated in medical decision making and plan of care.ROS completed by me personally and pertinent positives fully documented  I have made any additions or clarifications directly to the above note. Agree with note above.  Patient presented with transient horizontal diplopia and gait ataxia for 2030 minutes which has resolved.  MRI shows no brainstem infarct but does show small right corona radiata lacunar infarct which is clinically silent.  Etiology likely small vessel disease.  Recommend dual antiplatelet therapy aspirin Plavix for 3 weeks followed by aspirin alone and  aggressive risk factor modification.  Statin for elevated lipids.  Mobilize out of bed.  Therapy consults.  Long discussion with patient about her TIA and stroke risk and answering questions.  Discussed with Dr. [place] Pink.  Greater than 50% time during this 50-minute visit was spent in counseling and coordination of care about TIA and small vessel disease and silent infarct and answering questions.  Antony Contras, MD Medical Director [facility] Pager: [phone] 10/21/2022 2:56 PM   To contact Stroke Continuity provider, please refer to [email or web address] After hours, contact General Neurology

## 2022-10-21 NOTE — Discharge Summary (Signed)
[facility]  Physician Discharge Summary   Patient ID: Jillian Morris MRN: [id number] DOB/AGE: 79-Jan-1947 79 y.o.  Admit date: 10/20/2022 Discharge date: 10/21/2022    PCP: Shirline Frees, MD  DISCHARGE DIAGNOSES:    CVA (cerebral vascular accident) [facility])   Essential hypertension   Hypothyroidism   Fibromyalgia   GAD (generalized anxiety disorder)   Bipolar II disorder   Mild cognitive impairment of uncertain or unknown etiology   History of hiatal hernia   GERD (gastroesophageal reflux disease)   RECOMMENDATIONS FOR OUTPATIENT FOLLOW UP: Ambulatory referral sent to neurology    Home Health: None Equipment/Devices: None  CODE STATUS: Full code  DISCHARGE CONDITION: fair  Diet recommendation: Heart healthy  INITIAL HISTORY:  77 y.o. female with medical history significant for hypertension, hyperlipidemia, h/o subarachnoid hemorrhage, Sjogren's disease, dysphagia, Graves' disease, bipolar disorder, chronic back pain, fibromyalgia, and GERD presented with concerns of changes in her vision.  She reported double vision.  Also had some blurred vision in the left eye.  Reported numbness and tingling of the right hand.  She was hospitalized for further management.  MRI brain revealed acute infarct in the right corona radiata.  Chronic infarcts were also noted.     Consultants: Neurology.   Procedures: Transthoracic echocardiogram    HOSPITAL COURSE:   Acute stroke MRI findings incongruent with her symptoms. LDL 122.  Continue with atorvastatin.  Not on any statin presents for admission.   Patient noted to be on atorvastatin as of this morning.  Was not on any lipid lowering agents prior to admission. HbA1c is pending. Echocardiogram shows normal systolic function.  No embolic source identified. Patient seen by neurology.  Aspirin and Plavix recommended for 3 weeks followed by aspirin alone. Seen by PT and OT .  No needs identified.  Okay for discharge home  today.   Essential hypertension Continue home medications    Hypothyroidism Continue home medications.   Fibromyalgia Continue home medications.   History of anxiety and bipolar disorder Continue home medications.  Noted to be on Lamictal and alprazolam.   Mild cognitive impairment Delirium precautions.   Obesity Estimated body mass index is 30.21 kg/m as calculated from the following:   Height as of 03/07/21: '5\' 4"'$  (1.626 m).   Weight as of 03/07/21: 79.8 kg.  Patient is stable.  Okay for discharge home today.  PERTINENT LABS:  The results of significant diagnostics from this hospitalization (including imaging, microbiology, ancillary and laboratory) are listed below for reference.     Labs:   Basic Metabolic Panel: Recent Labs  Lab 10/20/22 1243 10/20/22 1252  NA 138 141  K 3.5 3.6  CL 107 109  CO2 22  --   GLUCOSE 94 94  BUN 14 16  CREATININE 1.07* 1.00  CALCIUM 9.0  --    Liver Function Tests: Recent Labs  Lab 10/20/22 1243  AST 20  ALT 16  ALKPHOS 106  BILITOT 0.5  PROT 6.1*  ALBUMIN 3.7    CBC: Recent Labs  Lab 10/20/22 1243 10/20/22 1252  WBC 9.2  --   NEUTROABS 4.9  --   HGB 13.6 13.9  HCT 40.8 41.0  MCV 101.2*  --   PLT 233  --      CBG: Recent Labs  Lab 10/20/22 1521  GLUCAP 92     IMAGING STUDIES ECHOCARDIOGRAM COMPLETE  Result Date: 10/21/2022    ECHOCARDIOGRAM REPORT   Patient Name:   Jillian Morris Date of Exam: 10/21/2022 Medical  Rec #:  [id number]        Height:       64.0 in Accession #:    [id number]       Weight:       176.0 lb Date of Birth:  04-17-1946        BSA:          1.853 m Patient Age:    77 years         BP:           124/63 mmHg Patient Gender: F                HR:           86 bpm. Exam Location:  Inpatient Procedure: 2D Echo, Cardiac Doppler and Color Doppler Indications:    Stroke I63.9  History:        Patient has no prior history of Echocardiogram examinations.                 Stroke; Risk  Factors:Hypertension. CKD, stage 3.  Sonographer:    Ronny Flurry Referring Phys: [id number] RONDELL A SMITH  Sonographer Comments: Image acquisition challenging due to respiratory motion. IMPRESSIONS  1. Left ventricular ejection fraction, by estimation, is 60 to 65%. The left ventricle has normal function. The left ventricle has no regional wall motion abnormalities. Left ventricular diastolic parameters were normal.  2. Right ventricular systolic function is normal. The right ventricular size is normal.  3. The mitral valve is normal in structure. No evidence of mitral valve regurgitation. No evidence of mitral stenosis.  4. The aortic valve is normal in structure. Aortic valve regurgitation is not visualized. No aortic stenosis is present.  5. The inferior vena cava is normal in size with greater than 50% respiratory variability, suggesting right atrial pressure of 3 mmHg.  6. No agitatated saline / bubble study performed. FINDINGS  Left Ventricle: Left ventricular ejection fraction, by estimation, is 60 to 65%. The left ventricle has normal function. The left ventricle has no regional wall motion abnormalities. The left ventricular internal cavity size was normal in size. There is  no left ventricular hypertrophy. Left ventricular diastolic parameters were normal. Right Ventricle: The right ventricular size is normal. No increase in right ventricular wall thickness. Right ventricular systolic function is normal. Left Atrium: Left atrial size was normal in size. Right Atrium: Right atrial size was normal in size. Pericardium: There is no evidence of pericardial effusion. Mitral Valve: The mitral valve is normal in structure. No evidence of mitral valve regurgitation. No evidence of mitral valve stenosis. Tricuspid Valve: The tricuspid valve is normal in structure. Tricuspid valve regurgitation is mild . No evidence of tricuspid stenosis. Aortic Valve: The aortic valve is normal in structure. Aortic valve  regurgitation is not visualized. No aortic stenosis is present. Aortic valve mean gradient measures 5.0 mmHg. Aortic valve peak gradient measures 8.9 mmHg. Aortic valve area, by VTI measures 2.43 cm. Pulmonic Valve: The pulmonic valve was normal in structure. Pulmonic valve regurgitation is not visualized. No evidence of pulmonic stenosis. Aorta: The aortic root is normal in size and structure. Venous: The inferior vena cava is normal in size with greater than 50% respiratory variability, suggesting right atrial pressure of 3 mmHg. IAS/Shunts: No atrial level shunt detected by color flow Doppler.  LEFT VENTRICLE PLAX 2D LVIDd:         3.20 cm   Diastology LVIDs:         2.10 cm  LV e' medial:    7.07 cm/s LV PW:         1.00 cm   LV E/e' medial:  11.7 LV IVS:        1.00 cm   LV e' lateral:   7.62 cm/s LVOT diam:     2.00 cm   LV E/e' lateral: 10.8 LV SV:         70 LV SV Index:   38 LVOT Area:     3.14 cm  RIGHT VENTRICLE             IVC RV S prime:     10.60 cm/s  IVC diam: 1.70 cm TAPSE (M-mode): 2.0 cm LEFT ATRIUM             Index        RIGHT ATRIUM          Index LA diam:        2.70 cm 1.46 cm/m   RA Area:     8.96 cm LA Vol (A2C):   52.2 ml 28.17 ml/m  RA Volume:   14.90 ml 8.04 ml/m LA Vol (A4C):   25.9 ml 13.98 ml/m LA Biplane Vol: 36.9 ml 19.91 ml/m  AORTIC VALVE AV Area (Vmax):    2.55 cm AV Area (Vmean):   2.42 cm AV Area (VTI):     2.43 cm AV Vmax:           149.00 cm/s AV Vmean:          103.050 cm/s AV VTI:            0.290 m AV Peak Grad:      8.9 mmHg AV Mean Grad:      5.0 mmHg LVOT Vmax:         121.00 cm/s LVOT Vmean:        79.500 cm/s LVOT VTI:          0.224 m LVOT/AV VTI ratio: 0.77  AORTA Ao Root diam: 3.30 cm Ao Asc diam:  2.90 cm MITRAL VALVE MV Area (PHT): 3.77 cm     SHUNTS MV Decel Time: 201 msec     Systemic VTI:  0.22 m MV E velocity: 82.50 cm/s   Systemic Diam: 2.00 cm MV A velocity: 123.00 cm/s MV E/A ratio:  0.67 Aditya Sabharwal Electronically signed by Hebert Soho Signature Date/Time: 10/21/2022/4:04:09 PM    Final    CT ANGIO HEAD NECK W WO CM  Result Date: 10/20/2022 CLINICAL DATA:  Neuro deficit, acute, stroke suspected.  Diplopia. EXAM: CT ANGIOGRAPHY HEAD AND NECK TECHNIQUE: Multidetector CT imaging of the head and neck was performed using the standard protocol during bolus administration of intravenous contrast. Multiplanar CT image reconstructions and MIPs were obtained to evaluate the vascular anatomy. Carotid stenosis measurements (when applicable) are obtained utilizing NASCET criteria, using the distal internal carotid diameter as the denominator. RADIATION DOSE REDUCTION: This exam was performed according to the departmental dose-optimization program which includes automated exposure control, adjustment of the mA and/or kV according to patient size and/or use of iterative reconstruction technique. CONTRAST:  59m OMNIPAQUE IOHEXOL 350 MG/ML SOLN COMPARISON:  Head MRI 10/20/2022 FINDINGS: CT HEAD FINDINGS Brain: There is no evidence of an acute large territory infarct, intracranial hemorrhage, mass, midline shift, or extra-axial fluid collection. The punctate acute infarct in the right corona radiata on MRI is occult by CT. Patchy hypodensities in the cerebral white matter and pons are nonspecific but compatible with moderate  chronic small vessel ischemic disease as described on MRI. A tiny chronic left cerebellar infarct is again noted. Cerebral atrophy is mild for age. Vascular: Calcified atherosclerosis at the skull base. Skull: No fracture or suspicious osseous lesion. Sinuses/Orbits: Paranasal sinuses and mastoid air cells are clear. Bilateral cataract extraction. Other: None. Review of the MIP images confirms the above findings CTA NECK FINDINGS Aortic arch: The aortic arch and proximal arch vessels were not imaged. Right carotid system: Patent with a small amount of calcified and soft plaque at the carotid bifurcation. No evidence of a  significant stenosis or dissection. Left carotid system: Exclusion of the proximal common carotid artery, otherwise patent without evidence stenosis, dissection, or significant atherosclerosis. Vertebral arteries: Patent and codominant with limited assessment of the proximal V1 segments due to motion and shoulder artifact. No evidence of a significant stenosis or dissection more distally. Skeleton: Advanced disc and facet degeneration in the cervical spine. Other neck: No evidence of cervical lymphadenopathy or mass. Upper chest: No mass or consolidation in the included lung apices. Review of the MIP images confirms the above findings CTA HEAD FINDINGS Anterior circulation: The internal carotid arteries are patent from skull base to carotid termini with mild atherosclerotic plaque bilaterally not resulting in significant stenosis. ACAs and MCAs are patent with mild atherosclerotic irregularity but no evidence of a proximal branch occlusion or high-grade proximal stenosis. No aneurysm is identified. Posterior circulation: The intracranial vertebral arteries are patent with mild atherosclerotic irregularity but no significant stenosis. The basilar artery is widely patent. There are small right and large left posterior communicating arteries with hypoplasia of the left P1 segment. Both PCAs are patent without evidence of a significant proximal stenosis on the right. There are mild to moderate left P2 stenoses. No aneurysm is identified. Venous sinuses: As permitted by contrast timing, patent. Anatomic variants: Fetal left PCA. Review of the MIP images confirms the above findings IMPRESSION: Mild atherosclerosis in the head and neck without a large vessel occlusion or high-grade proximal stenosis. Electronically Signed   By: Logan Bores M.D.   On: 10/20/2022 14:35   MR BRAIN WO CONTRAST  Result Date: 10/20/2022 CLINICAL DATA:  Provided history: Neuro deficit, acute, stroke suspected. Double vision. EXAM: MRI HEAD  WITHOUT CONTRAST TECHNIQUE: Multiplanar, multiecho pulse sequences of the brain and surrounding structures were obtained without intravenous contrast. COMPARISON:  Head CT 03/07/2021.  Brain MRI 04/13/2019. FINDINGS: Brain: Mild-to-moderate generalized cerebral atrophy. 2 mm acute infarct within the right corona radiata (series 2, image 32). Multifocal T2 FLAIR hyperintense signal abnormality within the bilateral cerebral white matter, basal ganglia and pons, overall moderate in severity. These signal changes are nonspecific, but compatible with chronic small vessel ischemic disease. Small chronic infarct within the left cerebellar hemisphere, new from the prior brain MRI of 04/01/2019. No evidence of an intracranial mass. No chronic intracranial blood products. No extra-axial fluid collection. No midline shift. Vascular: Maintained flow voids within the proximal large arterial vessels. Skull and upper cervical spine: No focal suspicious marrow lesion. Sinuses/Orbits: No mass or acute finding within the imaged orbits. Prior bilateral ocular lens replacement. No significant paranasal sinus disease. Other: Small-volume fluid within the left mastoid air cells. IMPRESSION: 1. 2 mm acute infarct within the right corona radiata. 2. Chronic small-vessel ischemic changes within the cerebral white matter, basal ganglia and pons. Findings are overall moderate in severity and slightly progressed from the prior MRI of 04/01/2019. 3. Small chronic infarct within the left cerebellar hemisphere, new from the prior  MRI. 4. Mild-to-moderate generalized cerebral atrophy. 5. Small left mastoid effusion. Electronically Signed   By: Kellie Simmering D.O.   On: 10/20/2022 14:00    DISCHARGE EXAMINATION: See progress note from earlier today  DISPOSITION: Home  Discharge Instructions     Ambulatory referral to Neurology   Complete by: As directed    An appointment is requested in approximately: 8 weeks   Call MD for:  difficulty  breathing, headache or visual disturbances   Complete by: As directed    Call MD for:  extreme fatigue   Complete by: As directed    Call MD for:  persistant dizziness or light-headedness   Complete by: As directed    Call MD for:  persistant nausea and vomiting   Complete by: As directed    Call MD for:  severe uncontrolled pain   Complete by: As directed    Call MD for:  temperature >100.4   Complete by: As directed    Diet - low sodium heart healthy   Complete by: As directed    Discharge instructions   Complete by: As directed    Please be sure to follow-up with your primary care provider in 1 week.  A referral has been sent to neurology for outpatient follow-up.  You were cared for by a hospitalist during your hospital stay. If you have any questions about your discharge medications or the care you received while you were in the hospital after you are discharged, you can call the unit and asked to speak with the hospitalist on call if the hospitalist that took care of you is not available. Once you are discharged, your primary care physician will handle any further medical issues. Please note that NO REFILLS for any discharge medications will be authorized once you are discharged, as it is imperative that you return to your primary care physician (or establish a relationship with a primary care physician if you do not have one) for your aftercare needs so that they can reassess your need for medications and monitor your lab values. If you do not have a primary care physician, you can call [phone] for a physician referral.   Increase activity slowly   Complete by: As directed          Allergies as of 10/21/2022       Reactions   Losartan Rash   Actonel [risedronate Sodium]    GI Upset   Ambien [zolpidem Tartrate] Other (See Comments)   Sleep-walking, hallucinations   Depakote [divalproex Sodium]    Hallucinations   Inderal [propranolol] Other (See Comments)   Hallucinations     Nsaids Other (See Comments)   Pt. Had gastric bypass surgery    Penicillins Other (See Comments)   GI upset. Did it involve swelling of the face/tongue/throat, SOB, or low BP? No Did it involve sudden or severe rash/hives, skin peeling, or any reaction on the inside of your mouth or nose? No Did you need to seek medical attention at a hospital or doctor's office? No When did it last happen?    As a child If all above answers are "NO", may proceed with cephalosporin use.   Silicon Nausea And Vomiting   Thyroid storm   Adhesive [tape] Itching, Rash   Please use "paper" tape   Cefuroxime Axetil Rash   Latex Swelling, Rash   Redness lips   Nickel Itching, Rash   Contact Dermatitis (intolerance)        Medication List  TAKE these medications    ALPRAZolam 0.5 MG tablet Commonly known as: XANAX Take 0.5 mg by mouth 2 (two) times daily as needed for anxiety.   amLODipine 2.5 MG tablet Commonly known as: NORVASC Take 2.5 mg by mouth daily.   aspirin EC 81 MG tablet Take 1 tablet (81 mg total) by mouth daily. Swallow whole.   atorvastatin 80 MG tablet Commonly known as: LIPITOR Take 1 tablet (80 mg total) by mouth daily.   carboxymethylcellulose 0.5 % Soln Commonly known as: REFRESH PLUS Place 1 drop into both eyes 3 (three) times daily as needed (dry eyes).   clopidogrel 75 MG tablet Commonly known as: PLAVIX Take 1 tablet (75 mg total) by mouth daily for 21 days. For only 3 weeks.   DULoxetine 60 MG capsule Commonly known as: CYMBALTA TAKE 2 CAPSULES (120 MG TOTAL) BY MOUTH EVERY EVENING.   ferrous sulfate 325 (65 FE) MG tablet Take 325 mg by mouth daily with breakfast.   HYDROmorphone 2 MG tablet Commonly known as: DILAUDID Take 1 tablet (2 mg total) by mouth 3 (three) times daily as needed  for pain   lamoTRIgine 100 MG tablet Commonly known as: LAMICTAL TAKE 3 TABLETS (300 MG TOTAL) BY MOUTH AT BEDTIME.   levothyroxine 125 MCG tablet Commonly known  as: SYNTHROID Take 125 mcg by mouth daily before breakfast.   LORazepam 1 MG tablet Commonly known as: ATIVAN Take 1 mg by mouth at bedtime.   meclizine 12.5 MG tablet Commonly known as: ANTIVERT Take 12.5 mg by mouth 3 (three) times daily as needed for dizziness.   omeprazole 40 MG capsule Commonly known as: PRILOSEC Take 40 mg by mouth in the morning and at bedtime.   PROBIOTIC DAILY PO Take 2 capsules by mouth daily.   rOPINIRole 1 MG tablet Commonly known as: REQUIP Take 1 mg by mouth at bedtime as needed (restless leg syndrome).   Vicks Sinex 0.05 % nasal spray Generic drug: oxymetazoline Place 1 spray into both nostrils 2 (two) times daily as needed for congestion.          Follow-up Information     Shirline Frees, MD. Schedule an appointment as soon as possible for a visit in 1 week(s).   Specialty: Family Medicine Why: post hospitalization follow up Contact information: [place] [address] [phone]         [facility]. Schedule an appointment as soon as possible for a visit in 2 month(s).   Specialty: Neurology Why: Please call and make an appointment with Dr. Leonie Man in 2 months after discharge Contact information: [address]: [address] minutes  [place]  [facility] Pager on [email or web address]  10/22/2022, 10:10 AM

## 2022-10-21 NOTE — Evaluation (Signed)
Occupational Therapy Evaluation Patient Details Name: SAFINA WIGFALL MRN: [id number] DOB: 1946-05-04 Today's Date: 10/21/2022   History of Present Illness Patient is a 77 yo female presenting to the ED with reports of double vision lasted less than half an hour, but thereafter she reported having fuzziness of the visual field in the left eye that seem to persist. 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 CTA of head and neck clear. PMH includes: hypertension, hyperlipidemia,h/o subarachnoid hemorrhage, Sjogren's disease, dysphagia, Graves' disease, bipolar disorder, chronic back pain, fibromyalgia, and GERD   Clinical Impression   Prior to this admission, patient was living with her spouse, independent in ADLs, and driving. Currently, patient is presenting with blurred vision in L eye, minimal instability in functional mobility and need for minimal increased assist to complete ADLs. Patient endorses occasional forgetfulness with medications, but states this is baseline. Patient with no L neglect visually, and not bumping into objects on L side into hallway during ambulation. OT handing off patient to PT in hallway to assess balance. OT will follow acutely, however no current OT needs after acute stay.      Recommendations for follow up therapy are one component of a multi-disciplinary discharge planning process, led by the attending physician.  Recommendations may be updated based on patient status, additional functional criteria and insurance authorization.   Follow Up Recommendations  No OT follow up     Assistance Recommended at Discharge Frequent or constant Supervision/Assistance (Initially)  Patient can return home with the following A little help with walking and/or transfers;A little help with bathing/dressing/bathroom;Assistance with cooking/housework;Direct  supervision/assist for medications management;Assist for transportation;Direct supervision/assist for financial management;Help with stairs or ramp for entrance (initially, will progress quickly)    Functional Status Assessment  Patient has had a recent decline in their functional status and demonstrates the ability to make significant improvements in function in a reasonable and predictable amount of time.  Equipment Recommendations  None recommended by OT (Patient has DME needed)    Recommendations for Other Services       Precautions / Restrictions Precautions Precautions: Fall Restrictions Weight Bearing Restrictions: No      Mobility Bed Mobility Overal bed mobility: Needs Assistance Bed Mobility: Sidelying to Sit   Sidelying to sit: Supervision            Transfers Overall transfer level: Needs assistance   Transfers: Sit to/from Stand Sit to Stand: Supervision                  Balance Overall balance assessment: Mild deficits observed, not formally tested                                         ADL either performed or assessed with clinical judgement   ADL Overall ADL's : Needs assistance/impaired Eating/Feeding: Set up;Sitting   Grooming: Set up;Standing   Upper Body Bathing: Set up;Sitting   Lower Body Bathing: Min guard;Sit to/from stand;Sitting/lateral leans   Upper Body Dressing : Set up;Sitting   Lower Body Dressing: Min guard;Sit to/from stand;Sitting/lateral leans   Toilet Transfer: Min guard;Ambulation   Toileting- Clothing Manipulation and Hygiene: Min guard;Sitting/lateral lean;Sit to/from stand       Functional mobility during ADLs: Min guard General ADL Comments:  Patient presenting with blurred vision in L eye, minimal instability in functional mobility and need for minimal increased assist to complete ADLs     Vision Baseline Vision/History: 0 No visual deficits Ability to See in Adequate Light: 0  Adequate Patient Visual Report: Blurring of vision Vision Assessment?: Yes Eye Alignment: Within Functional Limits Ocular Range of Motion: Within Functional Limits Alignment/Gaze Preference: Within Defined Limits Tracking/Visual Pursuits: Able to track stimulus in all quads without difficulty Saccades: Within functional limits Convergence: Within functional limits Visual Fields: No apparent deficits Additional Comments: Blurred vision in L eye, double vision no longer present, able to see in L visual field with no field cut or neglect noted     Perception     Praxis      Pertinent Vitals/Pain Pain Assessment Pain Assessment: Faces Pain Location: back pain, chronic Pain Descriptors / Indicators: Discomfort Pain Intervention(s): Limited activity within patient's tolerance, Monitored during session, Repositioned     Hand Dominance Right   Extremity/Trunk Assessment Upper Extremity Assessment Upper Extremity Assessment: Generalized weakness;RUE deficits/detail;LUE deficits/detail RUE Deficits / Details: limited AROM in bilateral shoulders, plans to have a total shoulders completed RUE: Shoulder pain with ROM;Unable to fully assess due to pain RUE Sensation: WNL RUE Coordination: decreased gross motor LUE Deficits / Details: limited AROM in bilateral shoulders, plans to have a total shoulders completed LUE: Unable to fully assess due to pain;Shoulder pain with ROM LUE Sensation: WNL LUE Coordination: decreased gross motor   Lower Extremity Assessment Lower Extremity Assessment: Defer to PT evaluation   Cervical / Trunk Assessment Cervical / Trunk Assessment: Normal   Communication Communication Communication: No difficulties   Cognition Arousal/Alertness: Awake/alert Behavior During Therapy: WFL for tasks assessed/performed Overall Cognitive Status: Within Functional Limits for tasks assessed                                       General Comments   VSS    Exercises     Shoulder Instructions      Home Living Family/patient expects to be discharged to:: Private residence Living Arrangements: Spouse/significant other Available Help at Discharge: Available 24 hours/day Type of Home: House Home Access: Stairs to enter [employer] of Steps: 6 Entrance Stairs-Rails: Left Home Layout: One level     Bathroom Shower/Tub: Tub/shower unit;Walk-in shower   Bathroom Toilet: Handicapped height Bathroom Accessibility: Yes   Home Equipment: Shower seat - built in;Rollator (4 wheels);Cane - single point;Grab bars - toilet;Grab bars - tub/shower   Additional Comments: independent and driving  Lives With: Spouse    Prior Functioning/Environment Prior Level of Function : Independent/Modified Independent;Driving             Mobility Comments: independent ADLs Comments: independent, driving        OT Problem List: Decreased strength;Decreased activity tolerance;Decreased range of motion;Impaired balance (sitting and/or standing);Impaired vision/perception;Decreased coordination;Decreased knowledge of use of DME or AE;Decreased safety awareness;Increased edema      OT Treatment/Interventions: Self-care/ADL training;Therapeutic exercise;Energy conservation;DME and/or AE instruction;Manual therapy;Patient/family education;Balance training;Therapeutic activities;Cognitive remediation/compensation;Visual/perceptual remediation/compensation    OT Goals(Current goals can be found in the care plan section) Acute Rehab OT Goals Patient Stated Goal: to go home OT Goal Formulation: With patient Time For Goal Achievement: 11/04/22 Potential to Achieve Goals: Good  OT Frequency: Min 2X/week    Co-evaluation              AM-PAC OT "6  Clicks" Daily Activity     Outcome Measure Help from another person eating meals?: A Little Help from another person taking care of personal grooming?: A Little Help from another person  toileting, which includes using toliet, bedpan, or urinal?: A Little Help from another person bathing (including washing, rinsing, drying)?: A Little Help from another person to put on and taking off regular upper body clothing?: A Little Help from another person to put on and taking off regular lower body clothing?: A Little 6 Click Score: 18   End of Session Equipment Utilized During Treatment: Gait belt Nurse Communication: Mobility status  Activity Tolerance: Patient tolerated treatment well Patient left: Other (comment) (handed off to PT in hallway for further balance assessment)  OT Visit Diagnosis: Unsteadiness on feet (R26.81);Muscle weakness (generalized) (M62.81);Pain Pain - Right/Left:  (Bilateral chronic back pain) Pain - part of body:  (Back)                Time: [id number] OT Time Calculation (min): 18 min Charges:  OT General Charges $OT Visit: 1 Visit OT Evaluation $OT Eval Moderate Complexity: 1 Mod  Corinne Ports E. Dhyan Noah, OTR/L Acute Rehabilitation Services [phone]   Ascencion Dike 10/21/2022, 12:29 PM

## 2022-10-21 NOTE — Progress Notes (Signed)
[facility] PROGRESS NOTE   Jillian Morris [id number] DOB: Dec 29, 1945 DOA: 10/20/2022  PCP: Shirline Frees, MD  Brief History/Interval Summary: 77 y.o. female with medical history significant for hypertension, hyperlipidemia, h/o subarachnoid hemorrhage, Sjogren's disease, dysphagia, Graves' disease, bipolar disorder, chronic back pain, fibromyalgia, and GERD presented with concerns of changes in her vision.  She reported double vision.  Also had some blurred vision in the left eye.  Reported numbness and tingling of the right hand.  She was hospitalized for further management.  MRI brain revealed acute infarct in the right corona radiata.  Chronic infarcts were also noted.    Consultants: Neurology.  Procedures: Transthoracic echocardiogram is pending    Subjective/Interval History: Patient mentions that her vision is back to baseline.  Tingling and numbness in the right hand has improved.  No new complaints offered.    Assessment/Plan:  Acute stroke MRI findings incongruent with her symptoms. Neurology to weigh in. Echocardiogram is pending. Patient currently on aspirin and Plavix. LDL 122.  Patient noted to be on atorvastatin as of this morning.  Was not on any lipid lowering agents prior to admission. HbA1c is pending. PT and OT evaluation is pending.  Essential hypertension Monitor blood pressures closely.  Permissive hypertension.  Noted to be on amlodipine.  Hypothyroidism Continue home medications.  Fibromyalgia Continue home medications.  History of anxiety and bipolar disorder Continue home medications.  Noted to be on Lamictal and alprazolam.  Mild cognitive impairment Delirium precautions.  Obesity Estimated body mass index is 30.21 kg/m as calculated from the following:   Height as of 03/07/21: '5\' 4"'$  (1.626 m).   Weight as of 03/07/21: 79.8 kg.   DVT Prophylaxis: Lovenox Code Status: Full code Family Communication: Discussed with the  patient Disposition Plan: To be determined  Status is: Observation The patient remains OBS appropriate and may d/c before 2 midnights.      Medications: Scheduled:   stroke: early stages of recovery book   Does not apply Once   amLODipine  2.5 mg Oral Daily   aspirin EC  81 mg Oral Daily   atorvastatin  80 mg Oral Daily   clopidogrel  75 mg Oral Daily   DULoxetine  60 mg Oral BID   enoxaparin (LOVENOX) injection  40 mg Subcutaneous Q24H   lamoTRIgine  100 mg Oral QHS   levothyroxine  100 mcg Oral QAC breakfast   LORazepam  1 mg Oral QHS   pantoprazole  40 mg Oral BID   sodium chloride flush  3 mL Intravenous Once   Continuous:  sodium chloride 75 mL/hr at 10/21/22 [id number]   [id number] **OR** acetaminophen (TYLENOL) oral liquid 160 mg/5 mL **OR** acetaminophen, ALPRAZolam, HYDROmorphone, oxymetazoline, polyvinyl alcohol, rOPINIRole, senna-docusate  Antibiotics: Anti-infectives (From admission, onward)    None       Objective:  Vital Signs  Vitals:   10/20/22 2031 10/20/22 2358 10/21/22 0314 10/21/22 0732  BP: (!) 140/72 128/70 125/65 124/63  Pulse: 81 81 81 75  Resp: '16 16 16 16  '$ Temp: 97.8 F (36.6 C) 98.2 F (36.8 C) 98.5 F (36.9 C) 97.7 F (36.5 C)  TempSrc: Oral Oral Oral Oral  SpO2: 99% 97% 95% 97%   No intake or output data in the 24 hours ending 10/21/22 1103 There were no vitals filed for this visit.  General appearance: Awake alert.  In no distress Resp: Clear to auscultation bilaterally.  Normal effort Cardio: S1-S2 is normal regular.  No S3-S4.  No rubs  murmurs or bruit GI: Abdomen is soft.  Nontender nondistended.  Bowel sounds are present normal.  No masses organomegaly Extremities: No edema.  Full range of motion of lower extremities. Neurologic: Alert and oriented x3.  No focal neurological deficits.    Lab Results:  Data Reviewed: I have personally reviewed following labs and reports of the imaging studies  CBC: Recent Labs   Lab 10/20/22 1243 10/20/22 1252  WBC 9.2  --   NEUTROABS 4.9  --   HGB 13.6 13.9  HCT 40.8 41.0  MCV 101.2*  --   PLT 233  --     Basic Metabolic Panel: Recent Labs  Lab 10/20/22 1243 10/20/22 1252  NA 138 141  K 3.5 3.6  CL 107 109  CO2 22  --   GLUCOSE 94 94  BUN 14 16  CREATININE 1.07* 1.00  CALCIUM 9.0  --     GFR: CrCl cannot be calculated (Unknown ideal weight.).  Liver Function Tests: Recent Labs  Lab 10/20/22 1243  AST 20  ALT 16  ALKPHOS 106  BILITOT 0.5  PROT 6.1*  ALBUMIN 3.7     Coagulation Profile: Recent Labs  Lab 10/20/22 1243  INR 0.9     CBG: Recent Labs  Lab 10/20/22 1521  GLUCAP 92    Lipid Profile: Recent Labs    10/20/22 1622  CHOL 213*  HDL 72  LDLCALC 122*  TRIG 95  CHOLHDL 3.0     Radiology Studies: CT ANGIO HEAD NECK W WO CM  Result Date: 10/20/2022 CLINICAL DATA:  Neuro deficit, acute, stroke suspected.  Diplopia. EXAM: CT ANGIOGRAPHY HEAD AND NECK TECHNIQUE: Multidetector CT imaging of the head and neck was performed using the standard protocol during bolus administration of intravenous contrast. Multiplanar CT image reconstructions and MIPs were obtained to evaluate the vascular anatomy. Carotid stenosis measurements (when applicable) are obtained utilizing NASCET criteria, using the distal internal carotid diameter as the denominator. RADIATION DOSE REDUCTION: This exam was performed according to the departmental dose-optimization program which includes automated exposure control, adjustment of the mA and/or kV according to patient size and/or use of iterative reconstruction technique. CONTRAST:  52m OMNIPAQUE IOHEXOL 350 MG/ML SOLN COMPARISON:  Head MRI 10/20/2022 FINDINGS: CT HEAD FINDINGS Brain: There is no evidence of an acute large territory infarct, intracranial hemorrhage, mass, midline shift, or extra-axial fluid collection. The punctate acute infarct in the right corona radiata on MRI is occult by CT.  Patchy hypodensities in the cerebral white matter and pons are nonspecific but compatible with moderate chronic small vessel ischemic disease as described on MRI. A tiny chronic left cerebellar infarct is again noted. Cerebral atrophy is mild for age. Vascular: Calcified atherosclerosis at the skull base. Skull: No fracture or suspicious osseous lesion. Sinuses/Orbits: Paranasal sinuses and mastoid air cells are clear. Bilateral cataract extraction. Other: None. Review of the MIP images confirms the above findings CTA NECK FINDINGS Aortic arch: The aortic arch and proximal arch vessels were not imaged. Right carotid system: Patent with a small amount of calcified and soft plaque at the carotid bifurcation. No evidence of a significant stenosis or dissection. Left carotid system: Exclusion of the proximal common carotid artery, otherwise patent without evidence stenosis, dissection, or significant atherosclerosis. Vertebral arteries: Patent and codominant with limited assessment of the proximal V1 segments due to motion and shoulder artifact. No evidence of a significant stenosis or dissection more distally. Skeleton: Advanced disc and facet degeneration in the cervical spine. Other neck: No evidence  of cervical lymphadenopathy or mass. Upper chest: No mass or consolidation in the included lung apices. Review of the MIP images confirms the above findings CTA HEAD FINDINGS Anterior circulation: The internal carotid arteries are patent from skull base to carotid termini with mild atherosclerotic plaque bilaterally not resulting in significant stenosis. ACAs and MCAs are patent with mild atherosclerotic irregularity but no evidence of a proximal branch occlusion or high-grade proximal stenosis. No aneurysm is identified. Posterior circulation: The intracranial vertebral arteries are patent with mild atherosclerotic irregularity but no significant stenosis. The basilar artery is widely patent. There are small right and  large left posterior communicating arteries with hypoplasia of the left P1 segment. Both PCAs are patent without evidence of a significant proximal stenosis on the right. There are mild to moderate left P2 stenoses. No aneurysm is identified. Venous sinuses: As permitted by contrast timing, patent. Anatomic variants: Fetal left PCA. Review of the MIP images confirms the above findings IMPRESSION: Mild atherosclerosis in the head and neck without a large vessel occlusion or high-grade proximal stenosis. Electronically Signed   By: Logan Bores M.D.   On: 10/20/2022 14:35   MR BRAIN WO CONTRAST  Result Date: 10/20/2022 CLINICAL DATA:  Provided history: Neuro deficit, acute, stroke suspected. Double vision. EXAM: MRI HEAD WITHOUT CONTRAST TECHNIQUE: Multiplanar, multiecho pulse sequences of the brain and surrounding structures were obtained without intravenous contrast. COMPARISON:  Head CT 03/07/2021.  Brain MRI 04/13/2019. FINDINGS: Brain: Mild-to-moderate generalized cerebral atrophy. 2 mm acute infarct within the right corona radiata (series 2, image 32). Multifocal T2 FLAIR hyperintense signal abnormality within the bilateral cerebral white matter, basal ganglia and pons, overall moderate in severity. These signal changes are nonspecific, but compatible with chronic small vessel ischemic disease. Small chronic infarct within the left cerebellar hemisphere, new from the prior brain MRI of 04/01/2019. No evidence of an intracranial mass. No chronic intracranial blood products. No extra-axial fluid collection. No midline shift. Vascular: Maintained flow voids within the proximal large arterial vessels. Skull and upper cervical spine: No focal suspicious marrow lesion. Sinuses/Orbits: No mass or acute finding within the imaged orbits. Prior bilateral ocular lens replacement. No significant paranasal sinus disease. Other: Small-volume fluid within the left mastoid air cells. IMPRESSION: 1. 2 mm acute infarct within  the right corona radiata. 2. Chronic small-vessel ischemic changes within the cerebral white matter, basal ganglia and pons. Findings are overall moderate in severity and slightly progressed from the prior MRI of 04/01/2019. 3. Small chronic infarct within the left cerebellar hemisphere, new from the prior MRI. 4. Mild-to-moderate generalized cerebral atrophy. 5. Small left mastoid effusion. Electronically Signed   By: Kellie Simmering D.O.   On: 10/20/2022 14:00       LOS: 0 days   [facility] Pager on [email or web address]  10/21/2022, 11:03 AM

## 2022-10-21 NOTE — Evaluation (Signed)
Physical Therapy Evaluation Patient Details Name: Jillian Morris MRN: [id number] DOB: 04/11/1946 Today's Date: 10/21/2022  History of Present Illness  Patient is a 77 yo female presenting to the ED 10/20/22 with reports of double vision lasted less than half an hour, but thereafter she reported having fuzziness of the visual field in the left eye that seem to persist. 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 CTA of head and neck clear. PMH includes: hypertension, hyperlipidemia,h/o subarachnoid hemorrhage, Sjogren's disease, dysphagia, Graves' disease, bipolar disorder, chronic back pain, fibromyalgia, and GERD   Clinical Impression  Pt presents with condition above and deficits mentioned below, see PT Problem List. PTA, she was independent without an AD for functional mobility, living with her husband in a 1-level house with 6 STE. Currently, pt displays some very mild L lower extremity incoordination. She also displays deficits in her dynamic standing balance, utilizing reactional strategies to regain and maintain her balance when ambulating and when reaching off COG. She reports some mild balance deficits at baseline, but reports she is more unstable currently than usual. She was able to ambulate without UE support at a min guard-supervision level today though. Educated pt and her husband of her deficits and recommendation to have supervision for standing mobility safety at this time. Pt is anticipated to quickly progress to her baseline, thus do not anticipate need for follow-up PT at d/c, but will continue to follow acutely to maximize her return to baseline prior to d/c.       Recommendations for follow up therapy are one component of a multi-disciplinary discharge planning process, led by the attending physician.  Recommendations may be updated based on patient status,  additional functional criteria and insurance authorization.  Follow Up Recommendations No PT follow up      Assistance Recommended at Discharge Intermittent Supervision/Assistance  Patient can return home with the following  Help with stairs or ramp for entrance;Assist for transportation;Assistance with cooking/housework;A little help with walking and/or transfers;A little help with bathing/dressing/bathroom    Equipment Recommendations None recommended by PT  Recommendations for Other Services       Functional Status Assessment Patient has had a recent decline in their functional status and demonstrates the ability to make significant improvements in function in a reasonable and predictable amount of time.     Precautions / Restrictions Precautions Precautions: Fall Restrictions Weight Bearing Restrictions: No      Mobility  Bed Mobility Overal bed mobility: Needs Assistance Bed Mobility: Sit to Supine       Sit to supine: Supervision, HOB elevated   General bed mobility comments: Supervision for safety    Transfers Overall transfer level: Needs assistance Equipment used: None Transfers: Sit to/from Stand Sit to Stand: Supervision           General transfer comment: Supervision for safety    Ambulation/Gait Ambulation/Gait assistance: Min guard, Supervision Gait Distance (Feet): 400 Feet Assistive device: None Gait Pattern/deviations: Step-through pattern, Decreased stride length, Narrow base of support, Wide base of support Gait velocity: reduced Gait velocity interpretation: 1.31 - 2.62 ft/sec, indicative of limited community ambulator   General Gait Details: Pt alternating feet placement from a narrow BOS <> wide BOS with noted instability, but pt able to maintain her balance with reactional strategies. No physical assistance needed, min guard-supervision for safety.  Stairs Stairs: Yes Stairs  assistance: Min guard Stair Management: One rail Left,  One rail Right, Alternating pattern, Step to pattern, Forwards Number of Stairs: 4 General stair comments: Ascends with L rail with reciprocal pattern and descends with R rail with step-to pattern, no LOB, min guard for safety  Wheelchair Mobility    Modified Rankin (Stroke Patients Only) Modified Rankin (Stroke Patients Only) Pre-Morbid Rankin Score: No symptoms Modified Rankin: Slight disability     Balance Overall balance assessment: Mild deficits observed, not formally tested                                           Pertinent Vitals/Pain Pain Assessment Pain Assessment: Faces Faces Pain Scale: Hurts little more Pain Location: back pain, chronic Pain Descriptors / Indicators: Discomfort Pain Intervention(s): Limited activity within patient's tolerance, Monitored during session, Repositioned, Premedicated before session    Home Living Family/patient expects to be discharged to:: Private residence Living Arrangements: Spouse/significant other Available Help at Discharge: Available 24 hours/day Type of Home: House Home Access: Stairs to enter Entrance Stairs-Rails: Left Entrance Stairs-Number of Steps: 6   Home Layout: One level Home Equipment: Shower seat - built in;Rollator (4 wheels);Cane - single point;Grab bars - toilet;Grab bars - tub/shower Additional Comments: independent and driving    Prior Function Prior Level of Function : Independent/Modified Independent;Driving             Mobility Comments: independent ADLs Comments: independent, driving     Hand Dominance   Dominant Hand: Right    Extremity/Trunk Assessment   Upper Extremity Assessment Upper Extremity Assessment: Defer to OT evaluation RUE Deficits / Details: limited AROM in bilateral shoulders, plans to have a total shoulders completed RUE: Shoulder pain with ROM;Unable to fully assess due to pain RUE Sensation: WNL RUE Coordination: decreased gross motor LUE Deficits /  Details: limited AROM in bilateral shoulders, plans to have a total shoulders completed LUE: Unable to fully assess due to pain;Shoulder pain with ROM LUE Sensation: WNL LUE Coordination: decreased gross motor    Lower Extremity Assessment Lower Extremity Assessment: LLE deficits/detail LLE Deficits / Details: MMT scores of 4+ to 5 grossly throughout (symmetrical to R); noted mild incoordination with rubbing L heel on R shin; denied any changes in sensation bil LLE Sensation: WNL LLE Coordination: decreased gross motor    Cervical / Trunk Assessment Cervical / Trunk Assessment: Normal  Communication   Communication: No difficulties  Cognition Arousal/Alertness: Awake/alert Behavior During Therapy: WFL for tasks assessed/performed Overall Cognitive Status: Within Functional Limits for tasks assessed                                          General Comments General comments (skin integrity, edema, etc.): encouraged pt not to drive unless cleared by MD; educated pt and husband to supervise pt for safety with standing mobility stability and higher cog tasks initially and to contact PCP for therapy referral/use her ADs at home if needed - they verbalized understanding    Exercises     Assessment/Plan    PT Assessment Patient needs continued PT services  PT Problem List Decreased balance;Decreased mobility;Decreased coordination       PT Treatment Interventions DME instruction;Gait training;Stair training;Functional mobility training;Therapeutic activities;Therapeutic exercise;Balance training;Neuromuscular re-education;Patient/family education    PT Goals (Current goals  can be found in the Care Plan section)  Acute Rehab PT Goals Patient Stated Goal: to go home PT Goal Formulation: With patient/family Time For Goal Achievement: 11/04/22 Potential to Achieve Goals: Good    Frequency Min 3X/week     Co-evaluation               AM-PAC PT "6 Clicks"  Mobility  Outcome Measure Help needed turning from your back to your side while in a flat bed without using bedrails?: None Help needed moving from lying on your back to sitting on the side of a flat bed without using bedrails?: A Little Help needed moving to and from a bed to a chair (including a wheelchair)?: A Little Help needed standing up from a chair using your arms (e.g., wheelchair or bedside chair)?: A Little Help needed to walk in hospital room?: A Little Help needed climbing 3-5 steps with a railing? : A Little 6 Click Score: 19    End of Session Equipment Utilized During Treatment: Gait belt Activity Tolerance: Patient tolerated treatment well Patient left: in bed;with call bell/phone within reach;with bed alarm set;with family/visitor present Nurse Communication: Mobility status PT Visit Diagnosis: Unsteadiness on feet (R26.81);Other abnormalities of gait and mobility (R26.89)    Time: 1209-1229 PT Time Calculation (min) (ACUTE ONLY): 20 min   Charges:   PT Evaluation $PT Eval Moderate Complexity: 1 Mod          Moishe Spice, PT, DPT Acute Rehabilitation Services  Office: [phone]   Orvan Falconer 10/21/2022, 2:17 PM

## 2022-10-21 NOTE — Care Management Obs Status (Signed)
[place] NOTIFICATION   Patient Details  Name: Jillian Morris MRN: [id number] Date of Birth: 13-Aug-1946   Medicare Observation Status Notification Given:  Yes    Pollie Friar, RN 10/21/2022, 3:18 PM

## 2022-10-21 NOTE — Progress Notes (Signed)
Echocardiogram 2D Echocardiogram has been performed.  Ronny Flurry 10/21/2022, 2:58 PM

## 2022-10-21 NOTE — Evaluation (Addendum)
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 03/18/2022   Abnormal gait    Allergic rhinitis    Chronic kidney disease, stage 3b    Chronic otitis externa    Decreased estrogen level    Generalized abdominal pain    Joint pain    Migraine, unspecified, not intractable, without status migrainosus    Pedal edema    Pharyngeal dysphagia    Pure hypercholesterolemia    Restless legs    S/P dilation of esophageal narrowing    Meralgia paraesthetica    Lymphedema    IBS (irritable bowel syndrome)    Hypothyroidism    History of hiatal hernia    Graves disease    GERD (gastroesophageal reflux disease)    Essential hypertension    Frequent falls    Dysphagia    Vitamin B12 deficiency    Arthritis    Iron deficiency anemia    Rosacea    Mild cognitive impairment of uncertain or unknown etiology 08/03/2019   GAD (generalized anxiety disorder) 07/10/2018   Insomnia 07/10/2018   Bipolar II disorder 04/28/2018   Sjogren's disease 06/29/2017   DDD (degenerative disc disease), cervical 06/29/2017   DDD (degenerative disc disease), lumbar 06/29/2017   Primary osteoarthritis of left knee 06/29/2017   History of total knee replacement, right 06/29/2017   Age-related osteoporosis without current pathological fracture 06/29/2017   Essential tremor 06/29/2017   History of TIA (transient ischemic attack) 06/29/2017   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   Major depressive disorder    Fibromyalgia    Traumatic subarachnoid bleed with LOC of 1 hour to 5 hours 59 minutes ([place]) 04/16/2016   Achalasia 08/09/2013   Stricture and stenosis of esophagus 03/30/2012   Esophageal  reflux 03/21/2012   Past Medical History:  Past Medical History:  Diagnosis Date   Abnormal gait    Achalasia 08/09/2013   Age-related osteoporosis without current pathological fracture 06/29/2017   Allergic rhinitis    Arthritis    Bipolar II disorder ([place]) 04/28/2018   Chronic kidney disease, stage 3b    Chronic otitis externa    Closed fracture of right distal radius    Closed fracture of zygomatic arch    DDD (degenerative disc disease), cervical 06/29/2017   DDD (degenerative disc disease), lumbar 06/29/2017   s/p laminectomy    Decreased estrogen level    Dysphagia    due to esophageal narrowing, able to take meds with thin liquid sif necessary    Essential hypertension    was briefly on losartan back early 2020 , had a severe reaction and was hospitalized , losartan was dc'd and she report shas not been prescribed any new bp meds as of yet, denies lasting issues from hospitalization    Essential tremor 06/29/2017   Fibromyalgia    Frequent falls    last fall was 03-09-2019 , my knee gave out. other times i feel like its vertigo . like a spacey feeling " . denies loc or loss of time , did not hit head    GAD (generalized anxiety disorder) 07/10/2018   Generalized abdominal pain    GERD (gastroesophageal reflux disease)    Graves disease    History of hiatal  hernia    History of TIA (transient ischemic attack) 06/29/2017   History of total knee replacement, right 06/29/2017   Hypothyroidism    IBS (irritable bowel syndrome)    resolved with use of oxymorhpne    Insomnia 07/10/2018   Iron deficiency anemia    s/p gastric bypass, malabsorption   Joint pain    Lymphedema    Major depressive disorder    Meralgia paraesthetica    Migraine    Mild cognitive impairment of uncertain or unknown etiology 08/03/2019   Pedal edema    Pharyngeal dysphagia    Primary osteoarthritis of left knee 06/29/2017   Pure hypercholesterolemia    Restless legs    Right wrist fracture  04/20/2016   Rosacea    S/P dilation of esophageal narrowing    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   Sjogren's disease 06/29/2017   Stricture and stenosis of esophagus 03/30/2012   Traumatic subarachnoid bleed with LOC of 1 hour to 5 hours 59 minutes 04/16/2016   Vitamin B12 deficiency    Past Surgical History:  Past Surgical History:  Procedure Laterality Date   ABDOMINAL HYSTERECTOMY     APPENDECTOMY     AUGMENTATION MAMMAPLASTY Bilateral    in her 57's pt had a reaction to her implants, she had them removed   BALLOON DILATION  03/30/2012   Procedure: BALLOON DILATION;  Surgeon: Inda Castle, MD;  Location: [facility];  Service: Endoscopy;  Laterality: N/A;   BOTOX INJECTION N/A 12/26/2014   Procedure: BOTOX INJECTION;  Surgeon: Inda Castle, MD;  Location: [facility];  Service: Endoscopy;  Laterality: N/A;   BOTOX INJECTION  11/12/2020   Procedure: BOTOX INJECTION;  Surgeon: Wilford Corner, MD;  Location: [facility];  Service: Endoscopy;;   ENDOMETRIAL ABLATION     x 4   ESOPHAGEAL MANOMETRY N/A 05/15/2013   Procedure: ESOPHAGEAL MANOMETRY (EM);  Surgeon: Inda Castle, MD;  Location: [facility];  Service: Endoscopy;  Laterality: N/A;   ESOPHAGOGASTRODUODENOSCOPY  03/30/2012   Procedure: ESOPHAGOGASTRODUODENOSCOPY (EGD);  Surgeon: Inda Castle, MD;  Location: [facility];  Service: Endoscopy;  Laterality: N/A;   ESOPHAGOGASTRODUODENOSCOPY  05/04/2012   Procedure: ESOPHAGOGASTRODUODENOSCOPY (EGD);  Surgeon: Inda Castle, MD;  Location: [facility];  Service: Endoscopy;  Laterality: N/A;   ESOPHAGOGASTRODUODENOSCOPY N/A 12/26/2014   Procedure: ESOPHAGOGASTRODUODENOSCOPY (EGD);  Surgeon: Inda Castle, MD;  Location: [facility];  Service: Endoscopy;  Laterality: N/A;  botox injection   ESOPHAGOGASTRODUODENOSCOPY (EGD) WITH PROPOFOL N/A 11/12/2020   Procedure: ESOPHAGOGASTRODUODENOSCOPY (EGD) WITH PROPOFOL;  Surgeon: Wilford Corner, MD;  Location: [facility];   Service: Endoscopy;  Laterality: N/A;   GASTRIC BYPASS     JOINT REPLACEMENT     right knee    KNEE ARTHROPLASTY     LAMINECTOMY     L3-L5   OPEN REDUCTION INTERNAL FIXATION (ORIF) DISTAL RADIAL FRACTURE Right 04/23/2016   Procedure: OPEN REDUCTION INTERNAL FIXATION (ORIF)  RIGHT DISTAL RADIAL FRACTURE;  Surgeon: Leanora Cover, MD;  Location: [facility];  Service: Orthopedics;  Laterality: Right;   ORIF TRIPOD FRACTURE Right 04/19/2016   Procedure: OPEN REDUCTION INTERNAL FIXATION (ORIF) TRIPOD FRACTURE LIEBINGER MIDFACE NO DRILL;  Surgeon: Leta Baptist, MD;  Location: [facility];  Service: ENT;  Laterality: Right;   REPLACEMENT TOTAL KNEE  01/2011   right   THYROIDECTOMY     TOTAL KNEE ARTHROPLASTY Left 03/13/2019   Procedure: TOTAL KNEE ARTHROPLASTY;  Surgeon: Vickey Huger, MD;  Location: [facility];  Service: Orthopedics;  Laterality:  Left;   HPI:  pt is a 77 yo female adm to [facility] with vision deficits and dizziness. Pt with PMH + for Sjogren's, achalasia, dysphagia, essential tremor, DDD, depression, dizziness.  She reports symptoms have resolved.  Speech-language evaluation ordered.  Prior MOCA exam conducted in 2017/8 when pt went to CIR after hemorrhage.   Assessment / Plan / Recommendation Clinical Impression  Patient alert, polite and participated in cognitive linguistic evaluation.  Portion of [place] Mental Status exam administered - as pt reports she may have had this test previously - SLP did not locate test in chart. Pt scored 30/30 points on exam.  Given potential "learned effect" if test the same as prior - varied word for recall task and category for naming *organization* items in one minute. Pt recalled 5/5 words without cues and named 15 words that started with letter "S". Slight upper labial facial asymmetry noted on the left - which pt states is new.  She appears with very subtle dysarthria but has Sjogren's that may impact her articulation abilities and she denies spouse reporting  changes.  Mild Cognitive Impairment reported by pt from prior evaluation conducted at MD and neuropsychologist office (uncertain of tests conducted). SLP offered tips for compensation for memory deficits including assuring her spouse checks to assure her medications are taken as ordered.  Option to use medicine container Sun through Sat with timer reviewed as well as using cell for "Notes" etc.  No acute SLP follow up indicated as pt at baseline function.    SLP Assessment  SLP Recommendation/Assessment: Patient does not need any further Speech [place] [facility]ology Services SLP Visit Diagnosis: Cognitive communication deficit (R41.841) Attention and concentration deficit following: Cerebral infarction    Recommendations for follow up therapy are one component of a multi-disciplinary discharge planning process, led by the attending physician.  Recommendations may be updated based on patient status, additional functional criteria and insurance authorization.    Follow Up Recommendations  No SLP follow up    Assistance Recommended at Discharge  None  Functional Status Assessment Patient has not had a recent decline in their functional status  Frequency and Duration   N/a        SLP Evaluation Cognition  Overall Cognitive Status: Within Functional Limits for tasks assessed Arousal/Alertness: Awake/alert Orientation Level: Oriented X4 Year: 2024 Month: February Day of Week: Correct Attention: Focused Focused Attention: Appears intact Memory: Appears intact Awareness: Appears intact Problem Solving: Appears intact (with minimal delay) Safety/Judgment: Appears intact       Comprehension  Auditory Comprehension Overall Auditory Comprehension: Appears within functional limits for tasks assessed Yes/No Questions: Not tested Commands: Within Functional Limits Conversation: Complex Visual Recognition/Discrimination Discrimination: Within Function Limits Reading Comprehension Reading  Status: Not tested    Expression Expression Primary Mode of Expression: Verbal Verbal Expression Overall Verbal Expression: Appears within functional limits for tasks assessed Initiation: No impairment Level of Generative/Spontaneous Verbalization: Conversation Repetition: No impairment Naming: Not tested Pragmatics: No impairment Written Expression Dominant Hand: Right   Oral / Motor  Oral Motor/Sensory Function Overall Oral Motor/Sensory Function: Within functional limits (slight left labial asymmetry - otherwise unremarkable) Motor Speech Overall Motor Speech: Appears within functional limits for tasks assessed Respiration: Within functional limits Phonation: Normal Resonance: Within functional limits Articulation: Within functional limitis Intelligibility: Intelligible Motor Planning: Not tested            Macario Golds 10/21/2022, 10:16 AM Kathleen Lime, MS [place] Office [phone]

## 2022-10-21 NOTE — Plan of Care (Signed)
  Problem: Education: Goal: Knowledge of disease or condition will improve Outcome: Progressing Goal: Knowledge of secondary prevention will improve (MUST DOCUMENT ALL) Outcome: Progressing Goal: Knowledge of patient specific risk factors will improve (Mark N/A or DELETE if not current risk factor) Outcome: Progressing   

## 2022-10-23 LAB — HEMOGLOBIN A1C
Hgb A1c MFr Bld: 5.6 % (ref 4.8–5.6)
Mean Plasma Glucose: 114 mg/dL

## 2022-11-06 ENCOUNTER — Encounter (HOSPITAL_COMMUNITY): Admission: RE | Admit: 2022-11-06 | Payer: Medicare Other | Source: Ambulatory Visit

## 2022-11-12 ENCOUNTER — Ambulatory Visit: Admit: 2022-11-12 | Payer: Medicare Other | Admitting: Orthopedic Surgery

## 2022-11-12 SURGERY — ARTHROPLASTY, SHOULDER, TOTAL, REVERSE
Anesthesia: General | Site: Shoulder | Laterality: Right

## 2022-11-16 ENCOUNTER — Other Ambulatory Visit (HOSPITAL_COMMUNITY): Payer: Self-pay

## 2022-11-16 MED ORDER — HYDROMORPHONE HCL 2 MG PO TABS
2.0000 mg | ORAL_TABLET | Freq: Three times a day (TID) | ORAL | 0 refills | Status: AC | PRN
  Filled 2022-11-16: qty 90, 30d supply, fill #0

## 2023-01-05 NOTE — Progress Notes (Addendum)
PCP - Jamey Ripa triad Cardiologist - LOV 01-09-21 Thomasene Ripple epic  does not see anyone now Neuro _ clearance on chart  11-25-22 LOV 08-13-23 Dr Pearlean Brownie   PPM/ICD -  Device Orders -  Rep Notified -   Chest x-ray - 09-14-22 epic EKG - 10-20-22 epic Stress Test -  ECHO -10-01-22 epic  Cardiac Cath -   Sleep Study -  CPAP -   Fasting Blood Sugar -  Checks Blood Sugar _____ times a day  Blood Thinner Instructions: Aspirin Instructions:hold 3 day not currently taking  ERAS Protcol - PRE-SURGERY Ensure    COVID vaccine -x2  Activity--Able to complete ADL's without SOB or CP Anesthesia review: TIA, HTN,CKD  Patient denies shortness of breath, fever, cough and chest pain at PAT appointment   All instructions explained to the patient, with a verbal understanding of the material. Patient agrees to go over the instructions while at home for a better understanding. Patient also instructed to self quarantine after being tested for COVID-19. The opportunity to ask questions was provided.

## 2023-01-05 NOTE — Patient Instructions (Signed)
SURGICAL WAITING ROOM VISITATION  Patients having surgery or a procedure may have no more than 2 support people in the waiting area - these visitors may rotate.    Children under the age of 50 must have an adult with them who is not the patient.  If the patient needs to stay at the hospital during part of their recovery, the visitor guidelines for inpatient rooms apply.  Pre-op nurse will coordinate an appropriate time for 1 support person to accompany patient in pre-op.  This support person may not rotate.    Please refer to the [facility] website for the visitor guidelines for Inpatients (after your surgery is over and you are in a regular room).       Your procedure is scheduled on: 01-21-23    Report to [facility] Main Entrance    Report to admitting at     1:00 PM   Call this number if you have problems the morning of surgery [phone]   Do not eat food :After Midnight.   After Midnight you may have the following liquids until _ 12:30_____ PM/  DAY OF SURGERY  then nothing by mouth  Water Non-Citrus Juices (without pulp, NO RED-Apple, White grape, White cranberry) Black Coffee (NO MILK/CREAM OR CREAMERS, sugar ok)  Clear Tea (NO MILK/CREAM OR CREAMERS, sugar ok) regular and decaf                             Plain Jell-O (NO RED)                                           Fruit ices (not with fruit pulp, NO RED)                                     Popsicles (NO RED)                                                               Sports drinks like Gatorade (NO RED)                    The day of surgery:  Drink ONE (1) Pre-Surgery Clear Ensure at 12:15  PM the morning of surgery. Drink in one sitting. Do not sip.  This drink was given to you during your hospital  pre-op appointment visit. Nothing else to drink after completing the  Pre-Surgery Clear Ensure by 12:30 Pm  then nothing by mouth          If you have questions, please contact your surgeon's  office.   FOLLOW ANY ADDITIONAL PRE OP INSTRUCTIONS YOU RECEIVED FROM YOUR SURGEON'S OFFICE!!!     Oral Hygiene is also important to reduce your risk of infection.                                     Remember - BRUSH YOUR TEETH THE MORNING OF SURGERY WITH YOUR REGULAR TOOTHPASTE  DENTURES WILL  BE REMOVED PRIOR TO SURGERY PLEASE DO NOT APPLY "Poly grip" OR ADHESIVES!!!      Take these medicines the morning of surgery with A SIP OF WATER: Synthroid, omeprazole, Hydromorphone if needed, Duloxetine, atorvastatin, amlodipine                               You may not have any metal on your body including hair pins, jewelry, and body piercing             Do not wear make-up, lotions, powders, perfumes/cologne, or deodorant  Do not wear nail polish including gel and S&S, artificial/acrylic nails, or any other type of covering on natural nails including finger and toenails. If you have artificial nails, gel coating, etc. that needs to be removed by a nail salon please have this removed prior to surgery or surgery may need to be canceled/ delayed if the surgeon/ anesthesia feels like they are unable to be safely monitored.               Do not bring valuables to the hospital. [facility] IS NOT             RESPONSIBLE   FOR VALUABLES.   Contacts, glasses, dentures or bridgework may not be worn into surgery.   Bring small overnight bag day of surgery.   DO NOT BRING YOUR HOME MEDICATIONS TO THE HOSPITAL. PHARMACY WILL DISPENSE MEDICATIONS LISTED ON YOUR MEDICATION LIST TO YOU DURING YOUR ADMISSION IN THE HOSPITAL!    Patients discharged on the day of surgery will not be allowed to drive home.  Someone NEEDS to stay with you for the first 24 hours after anesthesia.   Special Instructions:  If you have one Bring a copy of your healthcare power of attorney and living will documents the day of surgery if you haven't scanned them before. NOT required              Please read over the  following fact sheets you were given: IF YOU HAVE QUESTIONS ABOUT YOUR PRE-OP INSTRUCTIONS PLEASE CALL [phone]  If you test positive for Covid or have been in contact with anyone that has tested positive in the last 10 days please notify you surgeon.      Pre-operative 5 CHG Bath Instructions   You can play a key role in reducing the risk of infection after surgery. Your skin needs to be as free of germs as possible. You can reduce the number of germs on your skin by washing with CHG (chlorhexidine gluconate) soap before surgery. CHG is an antiseptic soap that kills germs and continues to kill germs even after washing.   DO NOT use if you have an allergy to chlorhexidine/CHG or antibacterial soaps. If your skin becomes reddened or irritated, stop using the CHG and notify one of our RNs at [phone].   Please shower with the CHG soap starting 4 days before surgery using the following schedule:     Please keep in mind the following:  DO NOT shave, including legs and underarms, starting the day of your first shower.   You may shave your face at any point before/day of surgery.  Place clean sheets on your bed the day you start using CHG soap. Use a clean washcloth (not used since being washed) for each shower. DO NOT sleep with pets once you start using the CHG.   CHG Shower Instructions:  If you  choose to wash your hair and private area, wash first with your normal shampoo/soap.  After you use shampoo/soap, rinse your hair and body thoroughly to remove shampoo/soap residue.  Turn the water OFF and apply about 3 tablespoons (45 ml) of CHG soap to a CLEAN washcloth.  Apply CHG soap ONLY FROM YOUR NECK DOWN TO YOUR TOES (washing for 3-5 minutes)  DO NOT use CHG soap on face, private areas, open wounds, or sores.  Pay special attention to the area where your surgery is being performed.  If you are having back surgery, having someone wash your back for you may be helpful. Wait 2  minutes after CHG soap is applied, then you may rinse off the CHG soap.  Pat dry with a clean towel  Put on clean clothes/pajamas   If you choose to wear lotion, please use ONLY the CHG-compatible lotions on the back of this paper.     Additional instructions for the day of surgery: DO NOT APPLY any lotions, deodorants, cologne, or perfumes.   Put on clean/comfortable clothes.  Brush your teeth.  Ask your nurse before applying any prescription medications to the skin.      CHG Compatible Lotions   Aveeno Moisturizing lotion  Cetaphil Moisturizing Cream  Cetaphil Moisturizing Lotion  Clairol Herbal Essence Moisturizing Lotion, Dry Skin  Clairol Herbal Essence Moisturizing Lotion, Extra Dry Skin  Clairol Herbal Essence Moisturizing Lotion, Normal Skin  Curel Age Defying Therapeutic Moisturizing Lotion with Alpha Hydroxy  Curel Extreme Care Body Lotion  Curel Soothing Hands Moisturizing Hand Lotion  Curel Therapeutic Moisturizing Cream, Fragrance-Free  Curel Therapeutic Moisturizing Lotion, Fragrance-Free  Curel Therapeutic Moisturizing Lotion, Original Formula  Eucerin Daily Replenishing Lotion  Eucerin Dry Skin Therapy Plus Alpha Hydroxy Crme  Eucerin Dry Skin Therapy Plus Alpha Hydroxy Lotion  Eucerin Original Crme  Eucerin Original Lotion  Eucerin Plus Crme Eucerin Plus Lotion  Eucerin TriLipid Replenishing Lotion  Keri Anti-Bacterial Hand Lotion  Keri Deep Conditioning Original Lotion Dry Skin Formula Softly Scented  Keri Deep Conditioning Original Lotion, Fragrance Free Sensitive Skin Formula  Keri Lotion Fast Absorbing Fragrance Free Sensitive Skin Formula  Keri Lotion Fast Absorbing Softly Scented Dry Skin Formula  Keri Original Lotion  Keri Skin Renewal Lotion Keri Silky Smooth Lotion  Keri Silky Smooth Sensitive Skin Lotion  Nivea Body Creamy Conditioning Oil  Nivea Body Extra Enriched Lotion  Nivea Body Original Lotion  Nivea Body Sheer Moisturizing Lotion  Nivea Crme  Nivea Skin Firming Lotion  NutraDerm 30 Skin Lotion  NutraDerm Skin Lotion  NutraDerm Therapeutic Skin Cream  NutraDerm Therapeutic Skin Lotion  ProShield Protective Hand Cream  Provon moisturizing lotion  [place]- Preparing for Total Shoulder Arthroplasty    Before surgery, you can play an important role. Because skin is not sterile, your skin needs to be as free of germs as possible. You can reduce the number of germs on your skin by using the following products. Benzoyl Peroxide Gel Reduces the number of germs present on the skin Applied twice a day to shoulder area starting two days before surgery    ==================================================================  Please follow these instructions carefully:  BENZOYL PEROXIDE 5% GEL  Please do not use if you have an allergy to benzoyl peroxide.   If your skin becomes reddened/irritated stop using the benzoyl peroxide.  Starting two days before surgery, apply as follows: Apply benzoyl peroxide in the morning and at night. Apply after taking a shower. If you are not taking a shower  clean entire shoulder front, back, and side along with the armpit with a clean wet washcloth.  Place a quarter-sized dollop on your shoulder and rub in thoroughly, making sure to cover the front, back, and side of your shoulder, along with the armpit.   2 days before ____ AM   ____ PM              1 day before ____ AM   ____ PM                         Do this twice a day for two days.  (Last application is the night before surgery, AFTER using the CHG soap as described below).  Do NOT apply benzoyl peroxide gel on the day of surgery.     Incentive Spirometer  An incentive spirometer is a tool that can help keep your lungs clear and active. This tool measures how well you are filling your lungs with each breath. Taking long deep breaths may help reverse or decrease the chance of developing breathing (pulmonary) problems  (especially infection) following: A long period of time when you are unable to move or be active. BEFORE THE PROCEDURE  If the spirometer includes an indicator to show your best effort, your nurse or respiratory therapist will set it to a desired goal. If possible, sit up straight or lean slightly forward. Try not to slouch. Hold the incentive spirometer in an upright position. INSTRUCTIONS FOR USE  Sit on the edge of your bed if possible, or sit up as far as you can in bed or on a chair. Hold the incentive spirometer in an upright position. Breathe out normally. Place the mouthpiece in your mouth and seal your lips tightly around it. Breathe in slowly and as deeply as possible, raising the piston or the ball toward the top of the column. Hold your breath for 3-5 seconds or for as long as possible. Allow the piston or ball to fall to the bottom of the column. Remove the mouthpiece from your mouth and breathe out normally. Rest for a few seconds and repeat Steps 1 through 7 at least 10 times every 1-2 hours when you are awake. Take your time and take a few normal breaths between deep breaths. The spirometer may include an indicator to show your best effort. Use the indicator as a goal to work toward during each repetition. After each set of 10 deep breaths, practice coughing to be sure your lungs are clear. If you have an incision (the cut made at the time of surgery), support your incision when coughing by placing a pillow or rolled up towels firmly against it. Once you are able to get out of bed, walk around indoors and cough well. You may stop using the incentive spirometer when instructed by your caregiver.  RISKS AND COMPLICATIONS Take your time so you do not get dizzy or light-headed. If you are in pain, you may need to take or ask for pain medication before doing incentive spirometry. It is harder to take a deep breath if you are having pain. AFTER USE Rest and breathe slowly and  easily. It can be helpful to keep track of a log of your progress. Your caregiver can provide you with a simple table to help with this. If you are using the spirometer at home, follow these instructions: SEEK MEDICAL CARE IF:  You are having difficultly using the spirometer. You have trouble using the spirometer  as often as instructed. Your pain medication is not giving enough relief while using the spirometer. You develop fever of 100.5 F (38.1 C) or higher. SEEK IMMEDIATE MEDICAL CARE IF:  You cough up bloody sputum that had not been present before. You develop fever of 102 F (38.9 C) or greater. You develop worsening pain at or near the incision site. MAKE SURE YOU:  Understand these instructions. Will watch your condition. Will get help right away if you are not doing well or get worse. Document Released: 12/21/2006 Document Revised: 11/02/2011 Document Reviewed: 02/21/2007 [facility] Patient Information 2014 [place], [place].   ________________________________________________________________________

## 2023-01-07 ENCOUNTER — Other Ambulatory Visit: Payer: Self-pay

## 2023-01-07 ENCOUNTER — Encounter (HOSPITAL_COMMUNITY): Payer: Self-pay

## 2023-01-07 ENCOUNTER — Inpatient Hospital Stay: Payer: Medicare Other | Admitting: Neurology

## 2023-01-07 ENCOUNTER — Encounter (HOSPITAL_COMMUNITY)
Admission: RE | Admit: 2023-01-07 | Discharge: 2023-01-07 | Disposition: A | Discharge status: Home / Self Care | Payer: Medicare Other | Source: Ambulatory Visit | Attending: Orthopedic Surgery | Admitting: Orthopedic Surgery

## 2023-01-07 DIAGNOSIS — Z01818 Encounter for other preprocedural examination: Secondary | ICD-10-CM

## 2023-01-07 HISTORY — DX: Pneumonia, unspecified organism: J18.9

## 2023-01-11 ENCOUNTER — Encounter (HOSPITAL_COMMUNITY)
Admission: RE | Admit: 2023-01-11 | Discharge: 2023-01-11 | Disposition: A | Discharge status: Home / Self Care | Payer: Medicare Other | Source: Ambulatory Visit | Attending: Orthopedic Surgery | Admitting: Orthopedic Surgery

## 2023-01-11 DIAGNOSIS — I10 Essential (primary) hypertension: Secondary | ICD-10-CM | POA: Diagnosis not present

## 2023-01-11 DIAGNOSIS — Z01812 Encounter for preprocedural laboratory examination: Secondary | ICD-10-CM | POA: Diagnosis present

## 2023-01-11 LAB — CBC
HCT: 40.1 % (ref 36.0–46.0)
Hemoglobin: 13.3 g/dL (ref 12.0–15.0)
MCH: 33 pg (ref 26.0–34.0)
MCHC: 33.2 g/dL (ref 30.0–36.0)
MCV: 99.5 fL (ref 80.0–100.0)
Platelets: 196 10*3/uL (ref 150–400)
RBC: 4.03 MIL/uL (ref 3.87–5.11)
RDW: 12.2 % (ref 11.5–15.5)
WBC: 6.2 10*3/uL (ref 4.0–10.5)
nRBC: 0 % (ref 0.0–0.2)

## 2023-01-11 LAB — SURGICAL PCR SCREEN
MRSA, PCR: NEGATIVE
Staphylococcus aureus: NEGATIVE

## 2023-01-11 LAB — BASIC METABOLIC PANEL
Anion gap: 9 (ref 5–15)
BUN: 13 mg/dL (ref 8–23)
CO2: 21 mmol/L — ABNORMAL LOW (ref 22–32)
Calcium: 8.8 mg/dL — ABNORMAL LOW (ref 8.9–10.3)
Chloride: 109 mmol/L (ref 98–111)
Creatinine, Ser: 0.97 mg/dL (ref 0.44–1.00)
GFR, Estimated: 60 mL/min — ABNORMAL LOW (ref >60)
Glucose, Bld: 100 mg/dL — ABNORMAL HIGH (ref 70–99)
Potassium: 3.3 mmol/L — ABNORMAL LOW (ref 3.5–5.1)
Sodium: 139 mmol/L (ref 135–145)

## 2023-01-21 ENCOUNTER — Ambulatory Visit (HOSPITAL_COMMUNITY)
Admission: RE | Admit: 2023-01-21 | Discharge: 2023-01-22 | Disposition: A | Discharge status: Home / Self Care | Payer: Medicare Other | Source: Ambulatory Visit | Attending: Orthopedic Surgery | Admitting: Orthopedic Surgery

## 2023-01-21 ENCOUNTER — Encounter (HOSPITAL_COMMUNITY)
Admission: RE | Disposition: A | Discharge status: Home / Self Care | Payer: Self-pay | Source: Ambulatory Visit | Attending: Orthopedic Surgery

## 2023-01-21 ENCOUNTER — Ambulatory Visit (HOSPITAL_BASED_OUTPATIENT_CLINIC_OR_DEPARTMENT_OTHER): Payer: Medicare Other | Admitting: Anesthesiology

## 2023-01-21 ENCOUNTER — Encounter (HOSPITAL_COMMUNITY): Payer: Self-pay | Admitting: Orthopedic Surgery

## 2023-01-21 ENCOUNTER — Ambulatory Visit (HOSPITAL_COMMUNITY): Payer: Medicare Other | Admitting: Anesthesiology

## 2023-01-21 ENCOUNTER — Other Ambulatory Visit: Payer: Self-pay

## 2023-01-21 DIAGNOSIS — M75101 Unspecified rotator cuff tear or rupture of right shoulder, not specified as traumatic: Secondary | ICD-10-CM | POA: Insufficient documentation

## 2023-01-21 DIAGNOSIS — Z01818 Encounter for other preprocedural examination: Secondary | ICD-10-CM

## 2023-01-21 DIAGNOSIS — I129 Hypertensive chronic kidney disease with stage 1 through stage 4 chronic kidney disease, or unspecified chronic kidney disease: Secondary | ICD-10-CM | POA: Insufficient documentation

## 2023-01-21 DIAGNOSIS — I10 Essential (primary) hypertension: Secondary | ICD-10-CM

## 2023-01-21 DIAGNOSIS — D649 Anemia, unspecified: Secondary | ICD-10-CM | POA: Diagnosis not present

## 2023-01-21 DIAGNOSIS — E039 Hypothyroidism, unspecified: Secondary | ICD-10-CM | POA: Diagnosis not present

## 2023-01-21 DIAGNOSIS — N1832 Chronic kidney disease, stage 3b: Secondary | ICD-10-CM | POA: Diagnosis not present

## 2023-01-21 DIAGNOSIS — K449 Diaphragmatic hernia without obstruction or gangrene: Secondary | ICD-10-CM | POA: Insufficient documentation

## 2023-01-21 DIAGNOSIS — J189 Pneumonia, unspecified organism: Secondary | ICD-10-CM | POA: Diagnosis not present

## 2023-01-21 DIAGNOSIS — R519 Headache, unspecified: Secondary | ICD-10-CM | POA: Diagnosis not present

## 2023-01-21 DIAGNOSIS — F319 Bipolar disorder, unspecified: Secondary | ICD-10-CM | POA: Insufficient documentation

## 2023-01-21 DIAGNOSIS — F419 Anxiety disorder, unspecified: Secondary | ICD-10-CM | POA: Diagnosis not present

## 2023-01-21 DIAGNOSIS — Z96611 Presence of right artificial shoulder joint: Secondary | ICD-10-CM

## 2023-01-21 DIAGNOSIS — M797 Fibromyalgia: Secondary | ICD-10-CM | POA: Diagnosis not present

## 2023-01-21 DIAGNOSIS — M19011 Primary osteoarthritis, right shoulder: Secondary | ICD-10-CM | POA: Insufficient documentation

## 2023-01-21 DIAGNOSIS — K219 Gastro-esophageal reflux disease without esophagitis: Secondary | ICD-10-CM | POA: Diagnosis not present

## 2023-01-21 HISTORY — PX: REVERSE SHOULDER ARTHROPLASTY: SHX5054

## 2023-01-21 SURGERY — ARTHROPLASTY, SHOULDER, TOTAL, REVERSE
Anesthesia: General | Site: Shoulder | Laterality: Right

## 2023-01-21 MED ORDER — ALUM & MAG HYDROXIDE-SIMETH 200-200-20 MG/5ML PO SUSP
30.0000 mL | ORAL | Status: DC | PRN
  Administered 2023-01-22: 30 mL via ORAL
  Filled 2023-01-21: qty 30

## 2023-01-21 MED ORDER — HYDROMORPHONE HCL 2 MG PO TABS
2.0000 mg | ORAL_TABLET | ORAL | Status: DC | PRN
  Administered 2023-01-22 (×2): 2 mg via ORAL
  Filled 2023-01-21: qty 1

## 2023-01-21 MED ORDER — METOCLOPRAMIDE HCL 5 MG PO TABS: 5.0000 mg | ORAL_TABLET | Freq: Three times a day (TID) | ORAL | Status: DC | PRN

## 2023-01-21 MED ORDER — LEVOTHYROXINE SODIUM 100 MCG PO TABS
100.0000 ug | ORAL_TABLET | Freq: Every day | ORAL | Status: DC
  Administered 2023-01-22: 100 ug via ORAL
  Filled 2023-01-21: qty 1

## 2023-01-21 MED ORDER — DEXAMETHASONE SODIUM PHOSPHATE 10 MG/ML IJ SOLN
INTRAMUSCULAR | Status: AC
  Filled 2023-01-21: qty 1

## 2023-01-21 MED ORDER — DIPHENHYDRAMINE HCL 25 MG PO CAPS
25.0000 mg | ORAL_CAPSULE | Freq: Four times a day (QID) | ORAL | Status: DC | PRN
  Administered 2023-01-21 – 2023-01-22 (×2): 25 mg via ORAL
  Filled 2023-01-21 (×2): qty 1

## 2023-01-21 MED ORDER — VANCOMYCIN HCL 1000 MG IV SOLR
INTRAVENOUS | Status: DC | PRN
  Administered 2023-01-21: 1000 mg via TOPICAL

## 2023-01-21 MED ORDER — FENTANYL CITRATE PF 50 MCG/ML IJ SOSY
PREFILLED_SYRINGE | INTRAMUSCULAR | Status: AC
  Administered 2023-01-21: 50 ug via INTRAVENOUS
  Filled 2023-01-21: qty 2

## 2023-01-21 MED ORDER — ONDANSETRON HCL 4 MG/2ML IJ SOLN
INTRAMUSCULAR | Status: AC
  Filled 2023-01-21: qty 2

## 2023-01-21 MED ORDER — PROPOFOL 10 MG/ML IV BOLUS
INTRAVENOUS | Status: DC | PRN
  Administered 2023-01-21: 120 mg via INTRAVENOUS

## 2023-01-21 MED ORDER — AMLODIPINE BESYLATE 5 MG PO TABS
5.0000 mg | ORAL_TABLET | Freq: Every day | ORAL | Status: DC
  Administered 2023-01-22: 5 mg via ORAL
  Filled 2023-01-21: qty 1

## 2023-01-21 MED ORDER — LACTATED RINGERS IV SOLN: INTRAVENOUS | Status: DC

## 2023-01-21 MED ORDER — PHENYLEPHRINE HCL (PRESSORS) 10 MG/ML IV SOLN
INTRAVENOUS | Status: AC
  Filled 2023-01-21: qty 1

## 2023-01-21 MED ORDER — ONDANSETRON HCL 4 MG/2ML IJ SOLN
INTRAMUSCULAR | Status: DC | PRN
  Administered 2023-01-21: 4 mg via INTRAVENOUS

## 2023-01-21 MED ORDER — FENTANYL CITRATE PF 50 MCG/ML IJ SOSY
25.0000 ug | PREFILLED_SYRINGE | INTRAMUSCULAR | Status: DC | PRN
  Administered 2023-01-21 (×2): 50 ug via INTRAVENOUS

## 2023-01-21 MED ORDER — ROCURONIUM BROMIDE 10 MG/ML (PF) SYRINGE
PREFILLED_SYRINGE | INTRAVENOUS | Status: AC
  Filled 2023-01-21: qty 10

## 2023-01-21 MED ORDER — ACETAMINOPHEN 325 MG PO TABS
325.0000 mg | ORAL_TABLET | Freq: Four times a day (QID) | ORAL | Status: DC | PRN
  Administered 2023-01-21: 650 mg via ORAL
  Filled 2023-01-21: qty 2

## 2023-01-21 MED ORDER — METHOCARBAMOL 500 MG IVPB - SIMPLE MED
500.0000 mg | Freq: Four times a day (QID) | INTRAVENOUS | Status: DC | PRN
  Administered 2023-01-21: 500 mg via INTRAVENOUS
  Filled 2023-01-21: qty 55

## 2023-01-21 MED ORDER — HYDROXYZINE HCL 10 MG PO TABS
10.0000 mg | ORAL_TABLET | Freq: Every evening | ORAL | Status: DC | PRN
  Administered 2023-01-22: 10 mg via ORAL
  Filled 2023-01-21 (×3): qty 1

## 2023-01-21 MED ORDER — ATORVASTATIN CALCIUM 40 MG PO TABS
80.0000 mg | ORAL_TABLET | Freq: Every day | ORAL | Status: DC
  Administered 2023-01-21 – 2023-01-22 (×2): 80 mg via ORAL
  Filled 2023-01-21 (×2): qty 2

## 2023-01-21 MED ORDER — LIDOCAINE HCL (CARDIAC) PF 100 MG/5ML IV SOSY
PREFILLED_SYRINGE | INTRAVENOUS | Status: DC | PRN
  Administered 2023-01-21: 75 mg via INTRAVENOUS

## 2023-01-21 MED ORDER — PANTOPRAZOLE SODIUM 40 MG PO TBEC
40.0000 mg | DELAYED_RELEASE_TABLET | Freq: Every day | ORAL | Status: DC
  Administered 2023-01-22: 40 mg via ORAL
  Filled 2023-01-21: qty 1

## 2023-01-21 MED ORDER — PROPOFOL 10 MG/ML IV BOLUS
INTRAVENOUS | Status: AC
  Filled 2023-01-21: qty 20

## 2023-01-21 MED ORDER — DULOXETINE HCL 60 MG PO CPEP
60.0000 mg | ORAL_CAPSULE | Freq: Two times a day (BID) | ORAL | Status: DC
  Administered 2023-01-21 – 2023-01-22 (×2): 60 mg via ORAL
  Filled 2023-01-21 (×2): qty 1

## 2023-01-21 MED ORDER — METHOCARBAMOL 500 MG PO TABS
500.0000 mg | ORAL_TABLET | Freq: Two times a day (BID) | ORAL | Status: DC | PRN
  Administered 2023-01-21 – 2023-01-22 (×2): 500 mg via ORAL
  Filled 2023-01-21 (×2): qty 1

## 2023-01-21 MED ORDER — FENTANYL CITRATE (PF) 100 MCG/2ML IJ SOLN
INTRAMUSCULAR | Status: DC | PRN
  Administered 2023-01-21: 50 ug via INTRAVENOUS

## 2023-01-21 MED ORDER — ROPINIROLE HCL 1 MG PO TABS: 1.0000 mg | ORAL_TABLET | Freq: Every evening | ORAL | Status: DC | PRN

## 2023-01-21 MED ORDER — PHENYLEPHRINE HCL-NACL 20-0.9 MG/250ML-% IV SOLN
INTRAVENOUS | Status: DC | PRN
  Administered 2023-01-21: 30 ug/min via INTRAVENOUS

## 2023-01-21 MED ORDER — HYDROMORPHONE HCL 2 MG PO TABS
1.0000 mg | ORAL_TABLET | ORAL | Status: DC | PRN
  Administered 2023-01-21 (×2): 2 mg via ORAL
  Filled 2023-01-21 (×3): qty 1

## 2023-01-21 MED ORDER — SUGAMMADEX SODIUM 500 MG/5ML IV SOLN
INTRAVENOUS | Status: DC | PRN
  Administered 2023-01-21: 200 mg via INTRAVENOUS

## 2023-01-21 MED ORDER — CHLORHEXIDINE GLUCONATE 0.12 % MT SOLN
15.0000 mL | Freq: Once | OROMUCOSAL | Status: AC
  Administered 2023-01-21: 15 mL via OROMUCOSAL

## 2023-01-21 MED ORDER — DEXAMETHASONE SODIUM PHOSPHATE 10 MG/ML IJ SOLN
INTRAMUSCULAR | Status: DC | PRN
  Administered 2023-01-21: 8 mg via INTRAVENOUS

## 2023-01-21 MED ORDER — ORAL CARE MOUTH RINSE: 15.0000 mL | Freq: Once | OROMUCOSAL | Status: AC

## 2023-01-21 MED ORDER — 0.9 % SODIUM CHLORIDE (POUR BTL) OPTIME
TOPICAL | Status: DC | PRN
  Administered 2023-01-21: 1000 mL

## 2023-01-21 MED ORDER — CEFAZOLIN SODIUM-DEXTROSE 2-4 GM/100ML-% IV SOLN
2.0000 g | INTRAVENOUS | Status: AC
  Administered 2023-01-21: 2 g via INTRAVENOUS
  Filled 2023-01-21: qty 100

## 2023-01-21 MED ORDER — ROCURONIUM BROMIDE 100 MG/10ML IV SOLN
INTRAVENOUS | Status: DC | PRN
  Administered 2023-01-21: 50 mg via INTRAVENOUS

## 2023-01-21 MED ORDER — METOCLOPRAMIDE HCL 5 MG/ML IJ SOLN: 5.0000 mg | Freq: Three times a day (TID) | INTRAMUSCULAR | Status: DC | PRN

## 2023-01-21 MED ORDER — TRANEXAMIC ACID-NACL 1000-0.7 MG/100ML-% IV SOLN
1000.0000 mg | INTRAVENOUS | Status: AC
  Administered 2023-01-21: 1000 mg via INTRAVENOUS
  Filled 2023-01-21: qty 100

## 2023-01-21 MED ORDER — BUPIVACAINE LIPOSOME 1.3 % IJ SUSP
INTRAMUSCULAR | Status: DC | PRN
  Administered 2023-01-21: 10 mL via PERINEURAL

## 2023-01-21 MED ORDER — HYDROMORPHONE HCL 2 MG PO TABS: 2.0000 mg | ORAL_TABLET | ORAL | Status: DC | PRN

## 2023-01-21 MED ORDER — FENTANYL CITRATE PF 50 MCG/ML IJ SOSY
PREFILLED_SYRINGE | INTRAMUSCULAR | Status: AC
  Filled 2023-01-21: qty 1

## 2023-01-21 MED ORDER — METHOCARBAMOL 500 MG PO TABS: 500.0000 mg | ORAL_TABLET | Freq: Four times a day (QID) | ORAL | Status: DC | PRN

## 2023-01-21 MED ORDER — ONDANSETRON HCL 4 MG/2ML IJ SOLN: 4.0000 mg | Freq: Four times a day (QID) | INTRAMUSCULAR | Status: DC | PRN

## 2023-01-21 MED ORDER — LIDOCAINE HCL (PF) 2 % IJ SOLN
INTRAMUSCULAR | Status: AC
  Filled 2023-01-21: qty 5

## 2023-01-21 MED ORDER — AMISULPRIDE (ANTIEMETIC) 5 MG/2ML IV SOLN: 10.0000 mg | Freq: Once | INTRAVENOUS | Status: DC | PRN

## 2023-01-21 MED ORDER — FENTANYL CITRATE PF 50 MCG/ML IJ SOSY
50.0000 ug | PREFILLED_SYRINGE | INTRAMUSCULAR | Status: DC
  Administered 2023-01-21: 50 ug via INTRAVENOUS
  Filled 2023-01-21: qty 2

## 2023-01-21 MED ORDER — FENTANYL CITRATE (PF) 100 MCG/2ML IJ SOLN
INTRAMUSCULAR | Status: AC
  Filled 2023-01-21: qty 2

## 2023-01-21 MED ORDER — ONDANSETRON HCL 4 MG PO TABS: 4.0000 mg | ORAL_TABLET | Freq: Four times a day (QID) | ORAL | Status: DC | PRN

## 2023-01-21 MED ORDER — LAMOTRIGINE 100 MG PO TABS
100.0000 mg | ORAL_TABLET | Freq: Every day | ORAL | Status: DC
  Administered 2023-01-21: 100 mg via ORAL
  Filled 2023-01-21: qty 1

## 2023-01-21 MED ORDER — BUPIVACAINE HCL (PF) 0.5 % IJ SOLN
INTRAMUSCULAR | Status: DC | PRN
  Administered 2023-01-21: 10 mL via PERINEURAL

## 2023-01-21 MED ORDER — HYDROMORPHONE HCL 1 MG/ML IJ SOLN
0.5000 mg | INTRAMUSCULAR | Status: DC | PRN
  Administered 2023-01-21 – 2023-01-22 (×2): 1 mg via INTRAVENOUS
  Filled 2023-01-21 (×2): qty 1

## 2023-01-21 MED ORDER — LORAZEPAM 0.5 MG PO TABS
0.5000 mg | ORAL_TABLET | Freq: Every day | ORAL | Status: DC
  Administered 2023-01-21: 0.5 mg via ORAL
  Filled 2023-01-21: qty 1

## 2023-01-21 MED ORDER — VANCOMYCIN HCL 1000 MG IV SOLR
INTRAVENOUS | Status: AC
  Filled 2023-01-21: qty 20

## 2023-01-21 MED ORDER — METHOCARBAMOL 500 MG IVPB - SIMPLE MED
INTRAVENOUS | Status: AC
  Filled 2023-01-21: qty 55

## 2023-01-21 SURGICAL SUPPLY — 70 items
ADH SKN CLS APL DERMABOND .7 (GAUZE/BANDAGES/DRESSINGS) ×1
AID PSTN UNV HD RSTRNT DISP (MISCELLANEOUS) ×1
BAG COUNTER SPONGE SURGICOUNT (BAG) IMPLANT
BAG SPEC THK2 15X12 ZIP CLS (MISCELLANEOUS) ×1
BAG SPNG CNTER NS LX DISP (BAG) ×1
BAG ZIPLOCK 12X15 (MISCELLANEOUS) ×1 IMPLANT
BIT DRILL AR 3 (BIT) ×1
BIT DRILL AR 3 NS (BIT) IMPLANT
BLADE SAW SGTL 83.5X18.5 (BLADE) ×1 IMPLANT
BNDG CMPR 5X4 CHSV STRCH STRL (GAUZE/BANDAGES/DRESSINGS) ×1
BNDG COHESIVE 4X5 TAN STRL LF (GAUZE/BANDAGES/DRESSINGS) ×1 IMPLANT
BSPLAT GLND +2X24 MDLR (Joint) ×1 IMPLANT
COOLER ICEMAN CLASSIC (MISCELLANEOUS) ×1 IMPLANT
COVER BACK TABLE 60X90IN (DRAPES) ×1 IMPLANT
COVER SURGICAL LIGHT HANDLE (MISCELLANEOUS) ×1 IMPLANT
CUP SUT UNIV REVERS 36 NEUTRAL (Cup) IMPLANT
DERMABOND ADVANCED .7 DNX12 (GAUZE/BANDAGES/DRESSINGS) ×1 IMPLANT
DRAPE ORTHO SPLIT 77X108 STRL (DRAPES) ×2
DRAPE SHEET LG 3/4 BI-LAMINATE (DRAPES) ×1 IMPLANT
DRAPE SURG 17X11 SM STRL (DRAPES) ×1 IMPLANT
DRAPE SURG ORHT 6 SPLT 77X108 (DRAPES) ×2 IMPLANT
DRAPE TOP 10253 STERILE (DRAPES) ×1 IMPLANT
DRAPE U-SHAPE 47X51 STRL (DRAPES) ×1 IMPLANT
DRESSING AQUACEL AG SP 3.5X6 (GAUZE/BANDAGES/DRESSINGS) ×1 IMPLANT
DRSG AQUACEL AG ADV 3.5X 6 (GAUZE/BANDAGES/DRESSINGS) IMPLANT
DRSG AQUACEL AG ADV 3.5X10 (GAUZE/BANDAGES/DRESSINGS) IMPLANT
DRSG AQUACEL AG SP 3.5X6 (GAUZE/BANDAGES/DRESSINGS) ×1
DURAPREP 26ML APPLICATOR (WOUND CARE) ×1 IMPLANT
ELECT BLADE TIP CTD 4 INCH (ELECTRODE) ×1 IMPLANT
ELECT PENCIL ROCKER SW 15FT (MISCELLANEOUS) ×1 IMPLANT
ELECT REM PT RETURN 15FT ADLT (MISCELLANEOUS) ×1 IMPLANT
FACESHIELD WRAPAROUND (MASK) ×5 IMPLANT
FACESHIELD WRAPAROUND OR TEAM (MASK) ×5 IMPLANT
GLENOID UNI REV MOD 24 +2 LAT (Joint) IMPLANT
GLENOID UNIV REV SHLD 36 +4 (Shoulder) IMPLANT
GLOVE BIO SURGEON STRL SZ7.5 (GLOVE) ×1 IMPLANT
GLOVE BIO SURGEON STRL SZ8 (GLOVE) ×1 IMPLANT
GLOVE SS BIOGEL STRL SZ 7 (GLOVE) ×1 IMPLANT
GLOVE SS BIOGEL STRL SZ 7.5 (GLOVE) ×1 IMPLANT
GOWN STRL SURGICAL XL XLNG (GOWN DISPOSABLE) ×2 IMPLANT
INSERT HUMERAL UNI REVERS 36 3 (Insert) IMPLANT
KIT BASIN OR (CUSTOM PROCEDURE TRAY) ×1 IMPLANT
KIT TURNOVER KIT A (KITS) IMPLANT
MANIFOLD NEPTUNE II (INSTRUMENTS) ×1 IMPLANT
NDL TAPERED W/ NITINOL LOOP (MISCELLANEOUS) ×1 IMPLANT
NEEDLE TAPERED W/ NITINOL LOOP (MISCELLANEOUS) ×1 IMPLANT
NS IRRIG 1000ML POUR BTL (IV SOLUTION) ×1 IMPLANT
PACK SHOULDER (CUSTOM PROCEDURE TRAY) ×1 IMPLANT
PAD ARMBOARD 7.5X6 YLW CONV (MISCELLANEOUS) ×1 IMPLANT
PAD COLD SHLDR WRAP-ON (PAD) ×1 IMPLANT
PIN NITINOL TARGETER 2.8 (PIN) IMPLANT
PIN SET MODULAR GLENOID SYSTEM (PIN) IMPLANT
RESTRAINT HEAD UNIVERSAL NS (MISCELLANEOUS) ×1 IMPLANT
SCREW CENTRAL MODULAR 25 (Screw) IMPLANT
SCREW PERI LOCK 5.5X16 (Screw) IMPLANT
SCREW PERI LOCK 5.5X32 (Screw) IMPLANT
SCREW PERIPHERAL 5.5X20 LOCK (Screw) IMPLANT
SLING ARM FOAM STRAP LRG (SOFTGOODS) IMPLANT
SLING ARM FOAM STRAP MED (SOFTGOODS) IMPLANT
STEM HUMERAL UNI REVERS SZ9 (Stem) IMPLANT
SUT MNCRL AB 3-0 PS2 18 (SUTURE) ×1 IMPLANT
SUT MON AB 2-0 CT1 36 (SUTURE) ×1 IMPLANT
SUT VIC AB 1 CT1 36 (SUTURE) ×1 IMPLANT
SUTURE TAPE 1.3 40 TPR END (SUTURE) ×2 IMPLANT
SUTURETAPE 1.3 40 TPR END (SUTURE) ×2
TOWEL OR 17X26 10 PK STRL BLUE (TOWEL DISPOSABLE) ×1 IMPLANT
TOWEL OR NON WOVEN STRL DISP B (DISPOSABLE) ×1 IMPLANT
TUBE SUCTION HIGH CAP CLEAR NV (SUCTIONS) ×1 IMPLANT
TUBING CONNECTING 10 (TUBING) IMPLANT
WATER STERILE IRR 1000ML POUR (IV SOLUTION) ×2 IMPLANT

## 2023-01-21 NOTE — Transfer of Care (Signed)
Immediate Anesthesia Transfer of Care Note  Patient: Jillian Morris  Procedure(s) Performed: REVERSE SHOULDER ARTHROPLASTY (Right: Shoulder)  Patient Location: PACU  Anesthesia Type:General  Level of Consciousness: awake, alert , and patient cooperative  Airway & Oxygen Therapy: Patient Spontanous Breathing and Patient connected to face mask oxygen  Post-op Assessment: Report given to RN, Post -op Vital signs reviewed and stable, and Patient moving all extremities X 4  Post vital signs: Reviewed and stable  Last Vitals:  Vitals Value Taken Time  BP 154/70 01/21/23 1208  Temp    Pulse 74 01/21/23 1211  Resp 22 01/21/23 1211  SpO2 96 % 01/21/23 1211  Vitals shown include unvalidated device data.  Last Pain:  Vitals:   01/21/23 1030  TempSrc:   PainSc: 0-No pain      Patients Stated Pain Goal: 4 (01/21/23 0957)  Complications: No notable events documented.

## 2023-01-21 NOTE — H&P (Signed)
Jillian Morris    Chief Complaint: Right shoulder advanced osteoarthritis HPI: The patient is a 77 y.o. female with chronic and progressive increasing right shoulder pain related to severe osteoarthritis and associated rotator cuff dysfunction.  Due to her increasing functional imitations and failure to respond to prolonged attempts of conservative management, she is brought to the operating this time for planned right shoulder reverse arthroplasty  Past Medical History:  Diagnosis Date   Abnormal gait    Achalasia 08/09/2013   Age-related osteoporosis without current pathological fracture 06/29/2017   Allergic rhinitis    Arthritis    Bipolar II disorder (HCC) 04/28/2018   Chronic kidney disease, stage 3b    Chronic otitis externa    Closed fracture of right distal radius    Closed fracture of zygomatic arch    DDD (degenerative disc disease), cervical 06/29/2017   DDD (degenerative disc disease), lumbar 06/29/2017   s/p laminectomy    Decreased estrogen level    Dysphagia    due to esophageal narrowing, able to take meds with thin liquid sif necessary    Essential hypertension    was briefly on losartan back early 2020 , had a severe reaction and was hospitalized , losartan was dc'd and she report shas not been prescribed any new bp meds as of yet, denies lasting issues from hospitalization    Essential tremor 06/29/2017   Fibromyalgia    Frequent falls    last fall was 03-09-2019 , my knee gave out. other times i feel like its vertigo . like a spacey feeling " . 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   Hypothyroidism    IBS (irritable bowel syndrome)    resolved with use of oxymorhpne     Insomnia 07/10/2018   Iron deficiency anemia    s/p gastric bypass, malabsorption   Joint pain    Lymphedema    Major depressive disorder    Meralgia paraesthetica    Migraine    Mild cognitive impairment of uncertain or unknown etiology 08/03/2019   Pedal edema    Pharyngeal dysphagia    Pneumonia    Primary osteoarthritis of left knee 06/29/2017   Pure hypercholesterolemia    Restless legs    Right wrist fracture 04/20/2016   Rosacea    S/P dilation of esophageal narrowing    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   Sjogren's disease 06/29/2017   Stricture and stenosis of esophagus 03/30/2012   Vitamin B12 deficiency       Past Surgical History:  Procedure Laterality Date   ABDOMINAL HYSTERECTOMY     APPENDECTOMY     AUGMENTATION MAMMAPLASTY Bilateral    in her 40's pt had a reaction to her implants, she had them removed   BALLOON DILATION  03/30/2012   Procedure: BALLOON DILATION;  Surgeon: Louis Meckel, MD;  Location: [facility] ENDOSCOPY;  Service: Endoscopy;  Laterality: N/A;   BOTOX INJECTION N/A 12/26/2014   Procedure: BOTOX INJECTION;  Surgeon: Louis Meckel, MD;  Location: [facility];  Service: Endoscopy;  Laterality: N/A;   BOTOX INJECTION  11/12/2020   Procedure: BOTOX INJECTION;  Surgeon: Charlott Rakes, MD;  Location: [facility];  Service: Endoscopy;;   ENDOMETRIAL ABLATION     x 4   ESOPHAGEAL MANOMETRY N/A 05/15/2013   Procedure: ESOPHAGEAL MANOMETRY (EM);  Surgeon: Louis Meckel, MD;  Location: [facility];  Service: Endoscopy;  Laterality: N/A;   ESOPHAGOGASTRODUODENOSCOPY  03/30/2012   Procedure: ESOPHAGOGASTRODUODENOSCOPY (EGD);  Surgeon: Louis Meckel, MD;  Location: [facility];  Service: Endoscopy;  Laterality: N/A;   ESOPHAGOGASTRODUODENOSCOPY  05/04/2012   Procedure: ESOPHAGOGASTRODUODENOSCOPY (EGD);  Surgeon: Louis Meckel, MD;  Location: [facility];  Service: Endoscopy;  Laterality: N/A;   ESOPHAGOGASTRODUODENOSCOPY N/A 12/26/2014   Procedure:  ESOPHAGOGASTRODUODENOSCOPY (EGD);  Surgeon: Louis Meckel, MD;  Location: [facility];  Service: Endoscopy;  Laterality: N/A;  botox injection   ESOPHAGOGASTRODUODENOSCOPY (EGD) WITH PROPOFOL N/A 11/12/2020   Procedure: ESOPHAGOGASTRODUODENOSCOPY (EGD) WITH PROPOFOL;  Surgeon: Charlott Rakes, MD;  Location: [facility];  Service: Endoscopy;  Laterality: N/A;   GASTRIC BYPASS     JOINT REPLACEMENT     right knee    KNEE ARTHROPLASTY     LAMINECTOMY     L3-L5   OPEN REDUCTION INTERNAL FIXATION (ORIF) DISTAL RADIAL FRACTURE Right 04/23/2016   Procedure: OPEN REDUCTION INTERNAL FIXATION (ORIF)  RIGHT DISTAL RADIAL FRACTURE;  Surgeon: Betha Loa, MD;  Location: [facility] OR;  Service: Orthopedics;  Laterality: Right;   ORIF TRIPOD FRACTURE Right 04/19/2016   Procedure: OPEN REDUCTION INTERNAL FIXATION (ORIF) TRIPOD FRACTURE LIEBINGER MIDFACE NO DRILL;  Surgeon: Newman Pies, MD;  Location: [facility] OR;  Service: ENT;  Laterality: Right;   REPLACEMENT TOTAL KNEE  01/2011   right   THYROIDECTOMY     TOTAL KNEE ARTHROPLASTY Left 03/13/2019   Procedure: TOTAL KNEE ARTHROPLASTY;  Surgeon: Dannielle Huh, MD;  Location: [facility];  Service: Orthopedics;  Laterality: Left;    Family History  Problem Relation Age of Onset   Prostate cancer Maternal Grandfather    Lymphoma Maternal Uncle    Heart disease Father    Heart disease Sister    Irritable bowel syndrome Sister    Dementia Mother    Stroke Maternal Grandmother    Appendicitis Paternal Grandmother    Colon cancer Neg Hx     Social History:  reports that she has never smoked. She has never used smokeless tobacco. She reports that she does not drink alcohol and does not use drugs.  BMI: Estimated body mass index is 29.81 kg/m as calculated from the following:   Height as of 01/11/23: 5\' 2"  (1.575 m).   Weight as of 01/11/23: 73.9 kg.  Lab Results  Component Value Date   ALBUMIN 3.7 10/20/2022   Diabetes: Patient does not have a diagnosis of  diabetes. Lab Results  Component Value Date   HGBA1C 5.6 10/20/2022     Smoking Status:   reports that she has never smoked. She has never used smokeless tobacco.     No medications prior to admission.     Physical Exam: Right shoulder demonstrates painful and profoundly restricted motion as noted at recent office visits.  Globally decreased strength to manual muscle testing.  Neurovascular intact in the right upper extremity.  Imaging studies confirm significant bony deformity related to severe osteoarthritis and associated rotator cuff dysfunction.  Vitals     Assessment/Plan  Impression: Right shoulder advanced osteoarthritis  Plan of Action: Procedure(s): REVERSE SHOULDER ARTHROPLASTY  Ana Liaw M Adarrius Graeff 01/21/2023, 6:16 AM Contact # [phone]

## 2023-01-21 NOTE — Anesthesia Procedure Notes (Signed)
Procedure Name: Intubation Date/Time: 01/21/2023 10:48 AM  Performed by: Shanon Payor, CRNAPre-anesthesia Checklist: Patient identified, Emergency Drugs available, Suction available, Patient being monitored and Timeout performed Patient Re-evaluated:Patient Re-evaluated prior to induction Oxygen Delivery Method: Circle system utilized Preoxygenation: Pre-oxygenation with 100% oxygen Induction Type: IV induction Ventilation: Mask ventilation without difficulty Laryngoscope Size: Mac and 3 Grade View: Grade I Tube type: Oral Tube size: 7.0 mm Number of attempts: 1 Airway Equipment and Method: Stylet Placement Confirmation: ETT inserted through vocal cords under direct vision, positive ETCO2, CO2 detector and breath sounds checked- equal and bilateral Secured at: 23 cm Tube secured with: Tape Dental Injury: Teeth and Oropharynx as per pre-operative assessment

## 2023-01-21 NOTE — Anesthesia Postprocedure Evaluation (Signed)
Anesthesia Post Note  Patient: Jillian Morris  Procedure(s) Performed: REVERSE SHOULDER ARTHROPLASTY (Right: Shoulder)     Patient location during evaluation: PACU Anesthesia Type: General Level of consciousness: sedated and patient cooperative Pain management: pain level controlled Vital Signs Assessment: post-procedure vital signs reviewed and stable Respiratory status: spontaneous breathing Cardiovascular status: stable Anesthetic complications: no   No notable events documented.  Last Vitals:  Vitals:   01/21/23 1330 01/21/23 1343  BP: 126/65 (!) 140/71  Pulse: 76 78  Resp: 19 16  Temp: 36.5 C 36.6 C  SpO2: 95% 97%    Last Pain:  Vitals:   01/21/23 1343  TempSrc: Oral  PainSc: 0-No pain                 Lewie Loron

## 2023-01-21 NOTE — Op Note (Signed)
01/21/2023  11:43 AM  PATIENT:   Jillian Morris  77 y.o. female  PRE-OPERATIVE DIAGNOSIS:  Right shoulder advanced osteoarthritis  POST-OPERATIVE DIAGNOSIS: Same with severe rotator cuff dysfunction  PROCEDURE: Right shoulder reverse arthroplasty lysing a press-fit size 9 Arthrex stem with a neutral metaphysis, +3 constrained polyethylene insert, 36/+4 glenosphere and a small/+2 baseplate  SURGEON:  Nael Petrosyan, Vania Rea M.D.  ASSISTANTS: Ralene Bathe, PA-C  Ralene Bathe, PA-C was utilized as an Geophysicist/field seismologist throughout this case, essential for help with positioning the patient, positioning extremity, tissue manipulation, implantation of the prosthesis, suture management, wound closure, and intraoperative decision-making.  ANESTHESIA:   General endotracheal and interscalene block with Exparel  EBL: 150 cc  SPECIMEN: None  Drains: None   PATIENT DISPOSITION:  PACU - hemodynamically stable.    PLAN OF CARE: Admit for overnight observation  Brief history:  Patient is a 77 year old female with chronic and progressive increasing right shoulder pain related to severe osteoarthritis and underlying rotator cuff dysfunction.  Due to her increasing functional imitations and failure to respond to prolonged attempts of conservative management, she is brought to the operating this time for planned right shoulder reverse arthroplasty.  Patient additionally has chronic pain and is in pain management, taking chronic narcotics.  She has been evaluated by her pain management physician preoperatively and all of her medications have been provided to her by her pain management physician for her postop pain management.  Preoperatively, I counseled the patient regarding treatment options and risks versus benefits thereof.  Possible surgical complications were all reviewed including potential for bleeding, infection, neurovascular injury, persistent pain, loss of motion, anesthetic complication, failure of  the implant, and possible need for additional surgery. They understand and accept and agrees with our planned procedure.   Procedure in detail:  After undergoing routine preop evaluation the patient received prophylactic antibiotics and interscalene block with Exparel was established in the holding area by the anesthesia department.  Subsequently placed spine on the operating table and underwent the smooth induction of a general endotracheal anesthesia.  Placed into the beachchair position and appropriately padded and protected.  The right shoulder girdle region was sterilely prepped and draped in standard fashion.  Timeout was called.  A deltopectoral approach was made to the right shoulder through an approximately 8 cm incision.  Skin flaps elevated dissection carried deeply and the deltopectoral interval was then developed from proximal to distal with the vein taken laterally.  The conjoined tendon was mobilized and retracted medially.  Long head biceps tendon was then tenodesed at the upper border the pectoralis major tendon with proximal segment unroofed and excised.  The superior rotator cuff was split from the apex of the bicipital groove to the base of the coracoid and the subscap was separated from the lesser tuberosity using electrocautery and the margin was tagged with a pair of grasping suture tape sutures.  Capsular attachments were then divided from the anterior and inferior margins of the humeral neck allowing deliver the humeral head through the wound.  An extra medullary guide was then used to outline our proposed humeral head resection which we performed with an oscillating saw at approximate 20 degrees of retroversion.  Marginal osteophytes were removed with a rondure and a metal cap was then placed over the cut proximal humeral surface.  The glenoid was then exposed and a circumferential labral resection was performed.  A guidepin was then directed into the center of the glenoid and the  glenoid was then reamed  with a central followed by the peripheral reamer to a stable subchondral bony bed.  Preparation completed with a drill and tap for 30 mm lag screw.  All debris was then carefully removed and irrigated free.  Our baseplate was assembled and then inserted with vancomycin powder applied to the threads of the lag screw with excellent fixation achieved.  All of the peripheral locking screws were then placed using standard technique with excellent fixation.  A 36/+4 glenosphere was then impacted onto the baseplate and the central locking screw was placed.  Attention returned to the humeral canal where the canal was opened by hand reaming and ultimately broached up to a size 9 at 20 degrees of retroversion.  A neutral metaphyseal reaming guide was then used to prepare the metaphysis.  A trial implant was placed which showed excellent motion stability and soft tissue balance.  This point the trial was removed.  A final implant was assembled.  The canal was irrigated cleaned and dried with vancomycin powder sprayed into the canal.  Our final implant was seated with excellent fixation.  Trial reductions were then performed and ultimately felt that a +3 poly gave is the best soft tissue balance.  We utilized a +3 constrained polyethylene insert which is impacted onto the implant and final reduction showed excellent motion stability and soft tissue balance all much to our satisfaction.  The joint was then copes irrigated cleaned and dried.  The subscapularis was confirmed to have good elasticity and the subscap was then repaired back to the eyelets on the collar of the implant using the previously placed suture tape sutures.  The arm easily achieved 45 degrees of external Tatian without excessive tension on the subscap repair.  Final irrigation completed.  Balance of the vancomycin powder was then sprayed liberally throughout the deep soft tissue planes.  The deltopectoral interval was reapproximated  with a series of figure-of-eight number Vicryl sutures.  2-0 Monocryl used to close the subcu layer and intracuticular 3-0 Monocryl used to close the skin followed by Dermabond and Aquacel dressing.  Right arm placed into a sling and the patient was awakened, extubated, and taken to the recovery room in stable condition.  Senaida Lange MD   Contact # [phone]

## 2023-01-21 NOTE — Anesthesia Preprocedure Evaluation (Addendum)
Anesthesia Evaluation  Patient identified by MRN, date of birth, ID band Patient awake    Reviewed: Allergy & Precautions, NPO status , Patient's Chart, lab work & pertinent test results  History of Anesthesia Complications Negative for: history of anesthetic complications  Airway Mallampati: II  TM Distance: >3 FB Neck ROM: Full    Dental  (+) Edentulous Upper, Edentulous Lower   Pulmonary pneumonia   Pulmonary exam normal breath sounds clear to auscultation       Cardiovascular hypertension, Pt. on medications Normal cardiovascular exam Rhythm:Regular Rate:Normal  Echo 09/2022  1. Left ventricular ejection fraction, by estimation, is 60 to 65%. The left ventricle has normal function. The left ventricle has no regional wall motion abnormalities. Left ventricular diastolic parameters were normal.   2. Right ventricular systolic function is normal. The right ventricular size is normal.   3. The mitral valve is normal in structure. No evidence of mitral valve regurgitation. No evidence of mitral stenosis.   4. The aortic valve is normal in structure. Aortic valve regurgitation is not visualized. No aortic stenosis is present.   5. The inferior vena cava is normal in size with greater than 50% respiratory variability, suggesting right atrial pressure of 3 mmHg.   6. No agitatated saline / bubble study performed.      Neuro/Psych  Headaches PSYCHIATRIC DISORDERS Anxiety Depression Bipolar Disorder    Neuromuscular disease CVA    GI/Hepatic Neg liver ROS, hiatal hernia,GERD  ,,  Endo/Other  Hypothyroidism    Renal/GU Renal disease  negative genitourinary   Musculoskeletal  (+) Arthritis ,  Fibromyalgia -, narcotic dependent  Abdominal   Peds  Hematology  (+) Blood dyscrasia, anemia   Anesthesia Other Findings Day of surgery medications reviewed with patient.  Reproductive/Obstetrics negative OB ROS                              Anesthesia Physical Anesthesia Plan  ASA: 3  Anesthesia Plan: General   Post-op Pain Management: Tylenol PO (pre-op)* and Regional block*   Induction:   PONV Risk Score and Plan: 3 and Ondansetron, Dexamethasone and Treatment may vary due to age or medical condition  Airway Management Planned: Oral ETT  Additional Equipment: None  Intra-op Plan:   Post-operative Plan: Extubation in OR  Informed Consent: I have reviewed the patients History and Physical, chart, labs and discussed the procedure including the risks, benefits and alternatives for the proposed anesthesia with the patient or authorized representative who has indicated his/her understanding and acceptance.     Dental advisory given  Plan Discussed with: CRNA  Anesthesia Plan Comments:         Anesthesia Quick Evaluation

## 2023-01-21 NOTE — Anesthesia Procedure Notes (Signed)
Anesthesia Regional Block: Interscalene brachial plexus block   Pre-Anesthetic Checklist: , timeout performed,  Correct Patient, Correct Site, Correct Laterality,  Correct Procedure, Correct Position, site marked,  Risks and benefits discussed,  Surgical consent,  Pre-op evaluation,  At surgeon's request and post-op pain management  Laterality: Upper and Right  Prep: chloraprep       Needles:  Injection technique: Single-shot  Needle Type: Stimulator Needle - 40     Needle Length: 4cm  Needle Gauge: 22     Additional Needles:   Procedures:,,,, ultrasound used (permanent image in chart),,    Narrative:  Start time: 01/21/2023 10:07 AM End time: 01/21/2023 10:27 AM Injection made incrementally with aspirations every 5 mL.  Performed by: Personally  Anesthesiologist: Lewie Loron, MD  Additional Notes: BP cuff, SpO2 and EKG monitors applied. Sedation begun. Nerve location verified with ultrasound. Anesthetic injected incrementally, slowly, and after neg aspirations under direct u/s guidance. Good perineural spread. Tolerated well.

## 2023-01-21 NOTE — Discharge Instructions (Signed)

## 2023-01-22 ENCOUNTER — Other Ambulatory Visit: Payer: Self-pay

## 2023-01-22 ENCOUNTER — Encounter (HOSPITAL_COMMUNITY): Payer: Self-pay | Admitting: Orthopedic Surgery

## 2023-01-22 DIAGNOSIS — M19011 Primary osteoarthritis, right shoulder: Secondary | ICD-10-CM | POA: Diagnosis not present

## 2023-01-22 NOTE — Discharge Summary (Signed)
PATIENT ID:      Jillian Morris  MRN:     [id number] DOB/AGE:    [birth year] / 77 y.o.     DISCHARGE SUMMARY  ADMISSION DATE:    01/21/2023 DISCHARGE DATE:  01/22/2023  ADMISSION DIAGNOSIS: Right shoulder advanced osteoarthritis Past Medical History:  Diagnosis Date   Abnormal gait    Achalasia 08/09/2013   Age-related osteoporosis without current pathological fracture 06/29/2017   Allergic rhinitis    Arthritis    Bipolar II disorder (HCC) 04/28/2018   Chronic kidney disease, stage 3b    Chronic otitis externa    Closed fracture of right distal radius    Closed fracture of zygomatic arch    DDD (degenerative disc disease), cervical 06/29/2017   DDD (degenerative disc disease), lumbar 06/29/2017   s/p laminectomy    Decreased estrogen level    Dysphagia    due to esophageal narrowing, able to take meds with thin liquid sif necessary    Essential hypertension    was briefly on losartan back early 2020 , had a severe reaction and was hospitalized , losartan was dc'd and she report shas not been prescribed any new bp meds as of yet, denies lasting issues from hospitalization    Essential tremor 06/29/2017   Fibromyalgia    Frequent falls    last fall was 03-09-2019 , my knee gave out. other times i feel like its vertigo . like a spacey feeling " . 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   Hypothyroidism    IBS (irritable bowel syndrome)    resolved with use of oxymorhpne    Insomnia 07/10/2018   Iron deficiency anemia    s/p gastric bypass, malabsorption   Joint pain    Lymphedema    Major depressive disorder    Meralgia paraesthetica    Migraine    Mild cognitive impairment of uncertain or  unknown etiology 08/03/2019   Pedal edema    Pharyngeal dysphagia    Pneumonia    Primary osteoarthritis of left knee 06/29/2017   Pure hypercholesterolemia    Restless legs    Right wrist fracture 04/20/2016   Rosacea    S/P dilation of esophageal narrowing    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  HOSPITAL COURSE:  Jillian Morris is a 77 y.o. admitted on 01/21/2023 with a diagnosis of Right shoulder advanced osteoarthritis.  They were brought to the operating room on 01/21/2023 and underwent Procedure(s): REVERSE SHOULDER ARTHROPLASTY.    They were given perioperative antibiotics:  Anti-infectives (From admission, onward)    Start     Dose/Rate Route Frequency Ordered Stop   01/21/23 1107  vancomycin (VANCOCIN) powder  Status:  Discontinued          As needed 01/21/23 1107 01/21/23 1340   01/21/23 0945  ceFAZolin (ANCEF) IVPB 2g/100 mL premix  2 g 200 mL/hr over 30 Minutes Intravenous On call to O.R. 01/21/23 0935 01/21/23 1057     .  Patient underwent the above named procedure and tolerated it well. The following day they were hemodynamically stable and pain was controlled on oral analgesics. They were neurovascularly intact to the operative extremity. OT was ordered and worked with patient per protocol. They were medically and orthopaedically stable for discharge on .    DIAGNOSTIC STUDIES:  RECENT RADIOGRAPHIC STUDIES :  No results found.  RECENT VITAL SIGNS:  Patient Vitals for the past 24 hrs:  BP Temp Temp src Pulse Resp SpO2 Height Weight  01/22/23 0629 (!) 146/75 98.3 F (36.8 C) -- -- 17 98 % -- --  01/22/23 0127 90/74 97.7 F (36.5 C) Oral (!)  53 17 100 % -- --  01/22/23 0116 120/67 98.3 F (36.8 C) Oral 91 16 96 % -- --  01/21/23 2116 133/67 97.8 F (36.6 C) Oral 91 18 93 % -- --  01/21/23 1808 (!) 116/53 98 F (36.7 C) Oral 84 17 94 % -- --  01/21/23 1343 (!) 140/71 97.8 F (36.6 C) Oral 78 16 97 % -- --  01/21/23 1330 126/65 97.7 F (36.5 C) -- 76 19 95 % -- --  01/21/23 1315 122/61 -- -- 76 19 93 % -- --  01/21/23 1300 (!) 99/58 -- -- 74 14 96 % -- --  01/21/23 1245 (!) 112/54 -- -- 74 14 97 % -- --  01/21/23 1230 102/88 -- -- 73 18 92 % -- --  01/21/23 1215 (!) 141/61 -- -- 74 16 90 % -- --  01/21/23 1208 (!) 154/70 97.7 F (36.5 C) -- 75 17 91 % -- --  01/21/23 1030 121/66 -- -- 68 13 99 % -- --  01/21/23 1025 116/63 -- -- 67 14 98 % -- --  01/21/23 1020 103/60 -- -- 69 15 96 % -- --  01/21/23 0957 -- -- -- -- -- -- 5\' 2"  (1.575 m) 73.9 kg  01/21/23 0943 129/70 98.3 F (36.8 C) Oral 74 12 96 % -- --  .  RECENT EKG RESULTS:    Orders placed or performed during the hospital encounter of 10/20/22   ED EKG   ED EKG   EKG 12-Lead   EKG 12-Lead    DISCHARGE INSTRUCTIONS:    DISCHARGE MEDICATIONS:   Allergies as of 01/22/2023       Reactions   Cefuroxime Axetil Rash   Severe rash   Losartan Rash   Actonel [risedronate Sodium]    GI Upset   Ambien [zolpidem Tartrate] Other (See Comments)   Sleep-walking, hallucinations   Depakote [divalproex Sodium]    Hallucinations   Inderal [propranolol] Other (See Comments)   Hallucinations    Nsaids Other (See Comments)   Pt. Had gastric bypass surgery    Penicillins Other (See Comments)   Stomach upset   Silicon Other (See Comments)   Thyroid storm   Adhesive [tape] Itching, Rash   Please use "paper" tape   Latex Swelling, Rash   Redness lips   Nickel Itching, Rash   Contact Dermatitis (intolerance)        Medication List     TAKE these medications    amLODipine 5 MG tablet Commonly known as: NORVASC Take 5 mg by mouth daily.   atorvastatin  80 MG tablet Commonly known as: LIPITOR Take 1 tablet (80 mg total) by mouth daily.   carboxymethylcellulose 0.5 %  Soln Commonly known as: REFRESH PLUS Place 1 drop into both eyes 3 (three) times daily as needed (dry eyes).   diphenhydrAMINE 25 mg capsule Commonly known as: BENADRYL Take 25 mg by mouth every 6 (six) hours as needed for allergies.   DULoxetine 60 MG capsule Commonly known as: CYMBALTA TAKE 2 CAPSULES (120 MG TOTAL) BY MOUTH EVERY EVENING. What changed:  how much to take when to take this   ferrous sulfate 325 (65 FE) MG tablet Take 325 mg by mouth at bedtime.   HYDROmorphone 2 MG tablet Commonly known as: DILAUDID Take 1 tablet (2 mg total) by mouth 3 (three) times daily as needed for pain.   hydrOXYzine 10 MG tablet Commonly known as: ATARAX Take 10 mg by mouth at bedtime as needed (insomnia).   lamoTRIgine 100 MG tablet Commonly known as: LAMICTAL Take 100 mg by mouth at bedtime.   LORazepam 0.5 MG tablet Commonly known as: ATIVAN Take 0.5 mg by mouth at bedtime.   methocarbamol 500 MG tablet Commonly known as: ROBAXIN Take 500 mg by mouth 2 (two) times daily as needed for muscle spasms.   omeprazole 40 MG capsule Commonly known as: PRILOSEC Take 40 mg by mouth in the morning and at bedtime.   PROBIOTIC DAILY PO Take 1 capsule by mouth at bedtime.   rOPINIRole 1 MG tablet Commonly known as: REQUIP Take 1 mg by mouth at bedtime as needed (restless leg syndrome).   Synthroid 100 MCG tablet Generic drug: levothyroxine Take 100 mcg by mouth daily before breakfast.        FOLLOW UP VISIT:    Follow-up Information     Francena Hanly, MD Follow up.   Specialty: Orthopedic Surgery Why: 02-03-2023 at 10:30 AM for -post-op Contact information: [address] [place] [address] [place] [address] [phone]                 DISCHARGE TO:   DISCHARGE CONDITION:  Eustaquio Maize Kinnedy Mongiello for Dr. Francena Hanly 01/22/2023, 7:22  AM

## 2023-01-22 NOTE — Plan of Care (Signed)
Problem: Pain Management: Goal: Pain level will decrease with appropriate interventions Outcome: Progressing   Problem: Clinical Measurements: Goal: Ability to maintain clinical measurements within normal limits will improve Outcome: Progressing   Problem: Coping: Goal: Level of anxiety will decrease Outcome: Progressing   Haydee Salter, RN 01/22/23 7:13 AM

## 2023-01-22 NOTE — Evaluation (Signed)
Occupational Therapy Evaluation Patient Details Name: Jillian Morris MRN: [id number] DOB: 08/05/1946 Today's Date: 01/22/2023   History of Present Illness Jillian Morris is a 77 yr old female who is s/p a R shoulder reverse arthroplasty on 01-21-23, due to advanced shoulder OA.   Clinical Impression   Pt is s/p shoulder replacement without functional use of right dominant upper extremity, secondary to effects of surgery and interscalene block and post-op shoulder precautions. Therapist provided education and instruction to patient and her spouse with regards to ROM/exercises, post-op precautions, UE positioning, donning upper extremity clothing, recommendations for bathing while maintaining shoulder precautions, use of ice for pain and edema management, use of ice machine, and correctly donning/doffing sling. Patient and spouse demonstrated fair+ understanding and teach back abilities. Patient needed assistance to donn gown and shoes, with instruction provided on compensatory strategies to perform ADLs. OT will continue to follow the pt for further services in the acute setting, to reinforce the aforementioned education provided, in order to maximize her independence with self-care tasks, to facilitate her safe return home, and to help facilitate optimal post-op healing. Patient to follow up with MD for further therapy needs.        Recommendations for follow up therapy are one component of a multi-disciplinary discharge planning process, led by the attending physician.  Recommendations may be updated based on patient status, additional functional criteria and insurance authorization.   Assistance Recommended at Discharge Intermittent Supervision/Assistance  Patient can return home with the following Direct supervision/assist for medications management;Assistance with cooking/housework;Assist for transportation;Help with stairs or ramp for entrance;A little help with bathing/dressing/bathroom;A little  help with walking and/or transfers    Functional Status Assessment  Patient has had a recent decline in their functional status and demonstrates the ability to make significant improvements in function in a reasonable and predictable amount of time.  Equipment Recommendations  None recommended by OT       Precautions / Restrictions Precautions Precautions: Shoulder Type of Shoulder Precautions: If sitting in controlled environment, ok to come out of sling to give neck a break. Please sleep in it to protect until follow up in office.     OK to use operative arm for feeding, hygiene and ADLs.   Ok to instruct Pendulums and lap slides as exercises. Ok to use operative arm within the following parameters for ADL purposes     New ROM (7/82)   Ok for PROM, AAROM, AROM within pain tolerance and within the following ROM   ER 20   ABD 45   FE 60. RUE NWB. Wear RUE sling at all times except ADLs/exercise. Okay to perform ROM of RUE elbow, wrist, and hand. Shoulder Interventions: Shoulder sling/immobilizer Precaution Booklet Issued: Yes (comment) Required Braces or Orthoses: Sling Restrictions Weight Bearing Restrictions: Yes RUE Weight Bearing: Non weight bearing      Mobility Bed Mobility Overal bed mobility: Needs Assistance Bed Mobility: Supine to Sit     Supine to sit: Supervision          Transfers Overall transfer level: Needs assistance Equipment used: None Transfers: Sit to/from Stand Sit to Stand: Supervision                  Balance Overall balance assessment: Mild deficits observed, not formally tested            ADL either performed or assessed with clinical judgement  Pertinent Vitals/Pain Pain Assessment Pain Assessment: 0-10 Pain Score: 6  Pain Location: R shoulder Pain Intervention(s): Ice applied     Hand Dominance Right      Communication Communication Communication: No difficulties   Cognition Arousal/Alertness:  Awake/alert Behavior During Therapy: WFL for tasks assessed/performed Overall Cognitive Status: Within Functional Limits for tasks assessed                  Shoulder Instructions Shoulder Instructions Donning/doffing shirt without moving shoulder: Minimal assistance Method for sponge bathing under operated UE: Patient able to independently direct caregiver Donning/doffing sling/immobilizer: Minimal assistance Correct positioning of sling/immobilizer: Minimal assistance Pendulum exercises (written home exercise program): Patient able to independently direct caregiver ROM for elbow, wrist and digits of operated UE: Minimal assistance (required reinforcement in this regard) Sling wearing schedule (on at all times/off for ADL's): Minimal assistance (required cues to recall) Proper positioning of operated UE when showering: Patient able to independently direct caregiver Positioning of UE while sleeping: Patient able to independently direct caregiver    Home Living Family/patient expects to be discharged to:: Private residence Living Arrangements: Spouse/significant other Available Help at Discharge: Family Type of Home: House (townhouse) Home Access: Stairs to enter Secretary/administrator of Steps: 5 Entrance Stairs-Rails: Left Home Layout: One level     Bathroom Shower/Tub: Walk-in shower         Home Equipment: Shower seat - built Char[employer] fundraiser (2 wheels);BSC/3in1;Cane - single point          Prior Functioning/Environment               Mobility Comments: independent ADLs Comments: She was independent with ADLs & shared household chores with her spouse. She has not been driving over the past couple months.        OT Problem List: Decreased range of motion;Impaired UE functional use;Pain      OT Treatment/Interventions: Self-care/ADL training;Therapeutic exercise;DME and/or AE instruction;Therapeutic activities;Balance training;Patient/family education     OT Goals(Current goals can be found in the care plan section) Acute Rehab OT Goals Patient Stated Goal: to return to normal activities OT Goal Formulation: With patient Time For Goal Achievement: 02/05/23 Potential to Achieve Goals: Good ADL Goals Pt Will Perform Upper Body Dressing: with supervision;sitting Pt Will Perform Lower Body Dressing: with supervision;sit to/from stand Pt/caregiver will Perform Home Exercise Program: Right Upper extremity;Independently  OT Frequency: Min 1X/week       AM-PAC OT "6 Clicks" Daily Activity     Outcome Measure Help from another person eating meals?: None Help from another person taking care of personal grooming?: None Help from another person toileting, which includes using toliet, bedpan, or urinal?: A Little Help from another person bathing (including washing, rinsing, drying)?: A Little Help from another person to put on and taking off regular upper body clothing?: A Little Help from another person to put on and taking off regular lower body clothing?: A Little 6 Click Score: 20   End of Session Equipment Utilized During Treatment: Other (comment) (none) Nurse Communication: Other (comment) (Shoulder education completed)  Activity Tolerance: Patient tolerated treatment well Patient left: Other (comment) (Pt left on toilet with spouse present)  OT Visit Diagnosis: Muscle weakness (generalized) (M62.81)                Time: 9528-4132 OT Time Calculation (min): 43 min Charges:  OT General Charges $OT Visit: 1 Visit OT Evaluation $OT Eval Moderate Complexity: 1 Mod OT Treatments $Self Care/Home Management : 8-22 mins  Reuben Likes, OTR/L 01/22/2023, 2:33 PM

## 2023-01-22 NOTE — TOC CM/SW Note (Signed)
Transition of Care [facility]) - Inpatient Brief Assessment   Patient Details  Name: Jillian Morris MRN: [id number] Date of Birth: 12/15/1945  Transition of [facility] CM/SW Contact:    Amada Jupiter, LCSW Phone Number: 01/22/2023, 10:53 AM   Clinical Narrative:  No TOC needs   Transition of Care Asessment: Insurance and Status: Insurance coverage has been reviewed Patient has primary care physician: Yes Home environment has been reviewed: home with spouse Prior level of function:: independent Prior/Current Home Services: No current home services Social Determinants of Health Reivew: SDOH reviewed no interventions necessary Readmission risk has been reviewed: Yes Transition of care needs: no transition of care needs at this time

## 2023-02-16 ENCOUNTER — Emergency Department (HOSPITAL_BASED_OUTPATIENT_CLINIC_OR_DEPARTMENT_OTHER)
Admission: EM | Admit: 2023-02-16 | Discharge: 2023-02-16 | Disposition: A | Discharge status: Home / Self Care | Payer: Medicare Other | Attending: Emergency Medicine | Admitting: Emergency Medicine

## 2023-02-16 ENCOUNTER — Other Ambulatory Visit: Payer: Self-pay

## 2023-02-16 ENCOUNTER — Emergency Department (HOSPITAL_BASED_OUTPATIENT_CLINIC_OR_DEPARTMENT_OTHER): Payer: Medicare Other

## 2023-02-16 ENCOUNTER — Encounter (HOSPITAL_BASED_OUTPATIENT_CLINIC_OR_DEPARTMENT_OTHER): Payer: Self-pay | Admitting: Emergency Medicine

## 2023-02-16 DIAGNOSIS — R310 Gross hematuria: Secondary | ICD-10-CM | POA: Diagnosis not present

## 2023-02-16 DIAGNOSIS — I959 Hypotension, unspecified: Secondary | ICD-10-CM | POA: Diagnosis not present

## 2023-02-16 DIAGNOSIS — R531 Weakness: Secondary | ICD-10-CM | POA: Diagnosis present

## 2023-02-16 DIAGNOSIS — Z79899 Other long term (current) drug therapy: Secondary | ICD-10-CM | POA: Diagnosis not present

## 2023-02-16 DIAGNOSIS — E876 Hypokalemia: Secondary | ICD-10-CM | POA: Diagnosis not present

## 2023-02-16 DIAGNOSIS — N189 Chronic kidney disease, unspecified: Secondary | ICD-10-CM | POA: Insufficient documentation

## 2023-02-16 DIAGNOSIS — I129 Hypertensive chronic kidney disease with stage 1 through stage 4 chronic kidney disease, or unspecified chronic kidney disease: Secondary | ICD-10-CM | POA: Diagnosis not present

## 2023-02-16 DIAGNOSIS — Z91040 Latex allergy status: Secondary | ICD-10-CM | POA: Diagnosis not present

## 2023-02-16 LAB — CBC WITH DIFFERENTIAL/PLATELET
Abs Immature Granulocytes: 0.02 10*3/uL (ref 0.00–0.07)
Basophils Absolute: 0.1 10*3/uL (ref 0.0–0.1)
Basophils Relative: 1 %
Eosinophils Absolute: 0.2 10*3/uL (ref 0.0–0.5)
Eosinophils Relative: 4 %
HCT: 34.9 % — ABNORMAL LOW (ref 36.0–46.0)
Hemoglobin: 11.2 g/dL — ABNORMAL LOW (ref 12.0–15.0)
Immature Granulocytes: 0 %
Lymphocytes Relative: 37 %
Lymphs Abs: 1.8 10*3/uL (ref 0.7–4.0)
MCH: 31.8 pg (ref 26.0–34.0)
MCHC: 32.1 g/dL (ref 30.0–36.0)
MCV: 99.1 fL (ref 80.0–100.0)
Monocytes Absolute: 0.3 10*3/uL (ref 0.1–1.0)
Monocytes Relative: 6 %
Neutro Abs: 2.5 10*3/uL (ref 1.7–7.7)
Neutrophils Relative %: 52 %
Platelets: 212 10*3/uL (ref 150–400)
RBC: 3.52 MIL/uL — ABNORMAL LOW (ref 3.87–5.11)
RDW: 13.3 % (ref 11.5–15.5)
WBC: 4.9 10*3/uL (ref 4.0–10.5)
nRBC: 0 % (ref 0.0–0.2)

## 2023-02-16 LAB — COMPREHENSIVE METABOLIC PANEL
ALT: 12 U/L (ref 0–44)
AST: 19 U/L (ref 15–41)
Albumin: 3.4 g/dL — ABNORMAL LOW (ref 3.5–5.0)
Alkaline Phosphatase: 117 U/L (ref 38–126)
Anion gap: 7 (ref 5–15)
BUN: 13 mg/dL (ref 8–23)
CO2: 27 mmol/L (ref 22–32)
Calcium: 8.8 mg/dL — ABNORMAL LOW (ref 8.9–10.3)
Chloride: 105 mmol/L (ref 98–111)
Creatinine, Ser: 0.9 mg/dL (ref 0.44–1.00)
GFR, Estimated: >60 mL/min (ref >60)
Glucose, Bld: 100 mg/dL — ABNORMAL HIGH (ref 70–99)
Potassium: 3.3 mmol/L — ABNORMAL LOW (ref 3.5–5.1)
Sodium: 139 mmol/L (ref 135–145)
Total Bilirubin: 0.7 mg/dL (ref 0.3–1.2)
Total Protein: 6.3 g/dL — ABNORMAL LOW (ref 6.5–8.1)

## 2023-02-16 LAB — URINALYSIS, ROUTINE W REFLEX MICROSCOPIC
Glucose, UA: NEGATIVE mg/dL
Ketones, ur: NEGATIVE mg/dL
Nitrite: NEGATIVE
Protein, ur: 30 mg/dL — AB
Specific Gravity, Urine: >=1.03 (ref 1.005–1.030)
pH: 6 (ref 5.0–8.0)

## 2023-02-16 LAB — URINALYSIS, MICROSCOPIC (REFLEX)

## 2023-02-16 MED ORDER — FENTANYL CITRATE PF 50 MCG/ML IJ SOSY
50.0000 ug | PREFILLED_SYRINGE | Freq: Once | INTRAMUSCULAR | Status: AC
  Administered 2023-02-16: 50 ug via INTRAVENOUS
  Filled 2023-02-16: qty 1

## 2023-02-16 MED ORDER — ONDANSETRON HCL 4 MG/2ML IJ SOLN
4.0000 mg | Freq: Once | INTRAMUSCULAR | Status: AC
  Administered 2023-02-16: 4 mg via INTRAVENOUS
  Filled 2023-02-16: qty 2

## 2023-02-16 MED ORDER — SODIUM CHLORIDE 0.9 % IV BOLUS
1000.0000 mL | Freq: Once | INTRAVENOUS | Status: AC
  Administered 2023-02-16: 1000 mL via INTRAVENOUS

## 2023-02-16 MED ORDER — IOHEXOL 300 MG/ML  SOLN
100.0000 mL | Freq: Once | INTRAMUSCULAR | Status: AC | PRN
  Administered 2023-02-16: 100 mL via INTRAVENOUS

## 2023-02-16 MED ORDER — POTASSIUM CHLORIDE CRYS ER 20 MEQ PO TBCR
40.0000 meq | EXTENDED_RELEASE_TABLET | Freq: Once | ORAL | Status: AC
  Administered 2023-02-16: 40 meq via ORAL
  Filled 2023-02-16: qty 2

## 2023-02-16 NOTE — ED Provider Notes (Signed)
[facility] Provider Note   CSN: [id number] Arrival date & time: 02/16/23  1603     History  Chief Complaint  Patient presents with   Dysuria    Jillian Morris is a 77 y.o. female with past medical history of GAD, DDD, bipolar 2 disorder, Sjogren's disease, CVA, CKD, hypertension, thyroid disease who presents to the ED complaining of dysuria and gross hematuria for the last 2 days.  She states that she believes that she had a UTI and has attempted to presents to urgent cares but has not yet been evaluated.  She has been taking Azo at home with some relief of the pain but it is still somewhat present as well.  Notes that hematuria has mostly resolved.  Has a history of chronic back pain but no change in this.  Does have some associated suprapubic abdominal pain as well.  No documented fever at home.  No nausea, vomiting, diarrhea.  Takes an 81 mg ASA a day since a CVA earlier this year but no other anticoagulants.  Other than CKD no history of other kidney problems or kidney stones.  Previous abdominal surgeries include hysterectomy, appendectomy.  She is status post recent shoulder surgery approximately 3 weeks ago.  She has not had an UTI in at least a year.  No recent antibiotics.  States that she generally does not feel well and has been very fatigued and generally weak.  Notes that she does take blood pressure medication but does not measure blood pressure daily.  Initial blood pressure on my exam was 91/60.  Patient states that it is never this low.  No history of severe low blood counts or requiring blood transfusion.  No other areas of bleeding identified.  Notes that hematuria has significantly improved since onset      Home Medications Prior to Admission medications   Medication Sig Start Date End Date Taking? Authorizing Provider  amLODipine (NORVASC) 5 MG tablet Take 5 mg by mouth daily. 05/05/19   [provider]   atorvastatin (LIPITOR) 80 MG tablet Take 1 tablet (80 mg total) by mouth daily. 10/22/22   Osvaldo Shipper, MD  carboxymethylcellulose (REFRESH PLUS) 0.5 % SOLN Place 1 drop into both eyes 3 (three) times daily as needed (dry eyes).    [provider]  diphenhydrAMINE (BENADRYL) 25 mg capsule Take 25 mg by mouth every 6 (six) hours as needed for allergies.    [provider]  DULoxetine (CYMBALTA) 60 MG capsule TAKE 2 CAPSULES (120 MG TOTAL) BY MOUTH EVERY EVENING. Patient taking differently: Take 60 mg by mouth 2 (two) times daily. 07/17/19   Cottle, Steva Ready., MD  ferrous sulfate 325 (65 FE) MG tablet Take 325 mg by mouth at bedtime.    [provider]  HYDROmorphone (DILAUDID) 2 MG tablet Take 1 tablet (2 mg total) by mouth 3 (three) times daily as needed for pain. 11/16/22     hydrOXYzine (ATARAX) 10 MG tablet Take 10 mg by mouth at bedtime as needed (insomnia).    [provider]  lamoTRIgine (LAMICTAL) 100 MG tablet Take 100 mg by mouth at bedtime.    [provider]  LORazepam (ATIVAN) 0.5 MG tablet Take 0.5 mg by mouth at bedtime.    [provider]  methocarbamol (ROBAXIN) 500 MG tablet Take 500 mg by mouth 2 (two) times daily as needed for muscle spasms.    [provider]  omeprazole (PRILOSEC) 40  MG capsule Take 40 mg by mouth in the morning and at bedtime. 09/13/20   [provider]  Probiotic Product (PROBIOTIC DAILY PO) Take 1 capsule by mouth at bedtime.    [provider]  rOPINIRole (REQUIP) 1 MG tablet Take 1 mg by mouth at bedtime as needed (restless leg syndrome).  02/22/16   [provider]  SYNTHROID 100 MCG tablet Take 100 mcg by mouth daily before breakfast.    [provider]      Allergies    Cefuroxime axetil, Losartan, Actonel [risedronate sodium], Ambien [zolpidem tartrate], Depakote [divalproex sodium], Inderal [propranolol], Nsaids, Penicillins, Silicon, Adhesive [tape],  Latex, and Nickel    Review of Systems   Review of Systems  All other systems reviewed and are negative.   Physical Exam Updated Vital Signs BP 125/71 (BP Location: Left Arm)   Pulse 77   Temp 98.3 F (36.8 C) (Oral)   Resp 17   Ht 5\' 2"  (1.575 m)   Wt 75.4 kg   SpO2 95%   BMI 30.42 kg/m  Physical Exam Vitals and nursing note reviewed.  Constitutional:      General: She is not in acute distress.    Appearance: Normal appearance. She is not toxic-appearing or diaphoretic.     Comments: Frail appearing  HENT:     Head: Normocephalic and atraumatic.     Mouth/Throat:     Mouth: Mucous membranes are dry.  Eyes:     General: No scleral icterus.    Conjunctiva/sclera: Conjunctivae normal.  Cardiovascular:     Rate and Rhythm: Normal rate and regular rhythm.     Heart sounds: No murmur heard. Pulmonary:     Effort: Pulmonary effort is normal. No respiratory distress.     Breath sounds: Normal breath sounds. No stridor. No wheezing, rhonchi or rales.  Abdominal:     General: Abdomen is flat. There is no distension.     Palpations: Abdomen is soft.     Tenderness: There is no abdominal tenderness. There is no right CVA tenderness, left CVA tenderness, guarding or rebound.  Musculoskeletal:     Cervical back: Normal range of motion and neck supple. No rigidity.     Right lower leg: No edema.     Left lower leg: No edema.     Comments: Right arm in sling  Skin:    General: Skin is warm and dry.     Capillary Refill: Capillary refill takes less than 2 seconds.     Coloration: Skin is not jaundiced or pale.     Findings: No rash.  Neurological:     Mental Status: She is alert and oriented to person, place, and time.  Psychiatric:        Mood and Affect: Mood normal.        Behavior: Behavior normal.     ED Results / Procedures / Treatments   Labs (all labs ordered are listed, but only abnormal results are displayed) Labs Reviewed  URINALYSIS, ROUTINE W REFLEX  MICROSCOPIC - Abnormal; Notable for the following components:      Result Value   APPearance CLOUDY (*)    Hgb urine dipstick MODERATE (*)    Bilirubin Urine SMALL (*)    Protein, ur 30 (*)    Leukocytes,Ua TRACE (*)    All other components within normal limits  URINALYSIS, MICROSCOPIC (REFLEX) - Abnormal; Notable for the following components:   Bacteria, UA MANY (*)    All other components  within normal limits  CBC WITH DIFFERENTIAL/PLATELET - Abnormal; Notable for the following components:   RBC 3.52 (*)    Hemoglobin 11.2 (*)    HCT 34.9 (*)    All other components within normal limits  COMPREHENSIVE METABOLIC PANEL - Abnormal; Notable for the following components:   Potassium 3.3 (*)    Glucose, Bld 100 (*)    Calcium 8.8 (*)    Total Protein 6.3 (*)    Albumin 3.4 (*)    All other components within normal limits    EKG None  Radiology CT ABDOMEN PELVIS W CONTRAST  Result Date: 02/16/2023 CLINICAL DATA:  abdominal pain, acute, nonlocalized EXAM: CT ABDOMEN AND PELVIS WITH CONTRAST TECHNIQUE: Multidetector CT imaging of the abdomen and pelvis was performed using the standard protocol following bolus administration of intravenous contrast. RADIATION DOSE REDUCTION: This exam was performed according to the departmental dose-optimization program which includes automated exposure control, adjustment of the mA and/or kV according to patient size and/or use of iterative reconstruction technique. CONTRAST:  OMNIPAQUE IOHEXOL 300 MG/ML  SOLN COMPARISON:  None Available. FINDINGS: Lower chest: No acute abnormality. Coronary artery calcification. Tiny hiatal hernia. Hepatobiliary: No focal liver abnormality. Status post cholecystectomy. No biliary dilatation. Pancreas: No focal lesion. Normal pancreatic contour. No surrounding inflammatory changes. No main pancreatic ductal dilatation. Spleen: Normal in size without focal abnormality. Adrenals/Urinary Tract: No adrenal nodule  bilaterally. Bilateral kidneys enhance symmetrically. No hydronephrosis. No hydroureter. The urinary bladder is unremarkable. On delayed imaging, there is no urothelial wall thickening and there are no filling defects in the opacified portions of the bilateral collecting systems or ureters. Stomach/Bowel: Roux-en-Y gastric bypass surgical changes. Stomach is within normal limits. No evidence of bowel wall thickening or dilatation. The appendix is not definitely identified with no inflammatory changes in the right lower quadrant to suggest acute appendicitis. Vascular/Lymphatic: No abdominal aorta or iliac aneurysm. At least moderate atherosclerotic plaque of the aorta and its branches. No abdominal, pelvic, or inguinal lymphadenopathy. Reproductive: Status post hysterectomy. No adnexal masses. Other: No intraperitoneal free fluid. No intraperitoneal free gas. No organized fluid collection. Musculoskeletal: Tiny fat containing umbilical hernia. No suspicious lytic or blastic osseous lesions. No acute displaced fracture. Severe degenerative changes of the spine with grade 1 anterolisthesis of L4 on L5. Associated bilateral L4 pars interarticularis defects. Plate and screw fixation of the radius on the right. IMPRESSION: 1. Tiny hiatal hernia. 2. Roux-en-Y gastric bypass and cholecystectomy. 3.  Aortic Atherosclerosis (ICD10-I70.0). Electronically Signed   By: Tish Frederickson M.D.   On: 02/16/2023 19:30    Procedures Procedures    Medications Ordered in ED Medications  potassium chloride SA (KLOR-CON M) CR tablet 40 mEq (has no administration in time range)  sodium chloride 0.9 % bolus 1,000 mL (1,000 mLs Intravenous New Bag/Given 02/16/23 1814)  fentaNYL (SUBLIMAZE) injection 50 mcg (50 mcg Intravenous Given 02/16/23 1816)  ondansetron (ZOFRAN) injection 4 mg (4 mg Intravenous Given 02/16/23 1815)  iohexol (OMNIPAQUE) 300 MG/ML solution 100 mL (100 mLs Intravenous Contrast Given 02/16/23 1903)    ED Course/  Medical Decision Making/ A&P                             Medical Decision Making Amount and/or Complexity of Data Reviewed Labs: ordered. Decision-making details documented in ED Course. Radiology: ordered. Decision-making details documented in ED Course. ECG/medicine tests: ordered. Decision-making details documented in ED Course.  Risk Prescription drug management.  Medical Decision Making:   KEIRSTIN LEACOCK is a 77 y.o. female who presented to the ED today with dysuria / hematuria / abdominal pain detailed above.    Additional history discussed with patient's family/caregivers.  Patient's presentation is complicated by their history of advanced age, multiple comorbidities.  Complete initial physical exam performed, notably the patient  was in no acute distress.  Abdomen soft, nontender, nondistended.  No CVA tenderness.  Patient does appear frail but is alert and oriented.    Reviewed and confirmed nursing documentation for past medical history, family history, social history.    Initial Assessment:   With the patient's presentation of abdominal pain, differential diagnosis includes but is not limited to AAA, mesenteric ischemia, appendicitis, diverticulitis, DKA, gastritis, gastroenteritis, AMI, nephrolithiasis, pancreatitis, peritonitis, adrenal insufficiency, intestinal ischemia, constipation, UTI, SBO/LBO, splenic rupture, biliary disease, IBD, IBS, PUD, hepatitis, STD, ovarian/testicular torsion, electrolyte disturbance, DKA, dehydration, acute kidney injury, renal failure, cholecystitis, cholelithiasis, choledocholithiasis, abdominal pain of  unknown etiology. Higher suspicion for urinary tract pathology given presentation.   Initial Plan:  Screening labs including CBC and Metabolic panel to evaluate for infectious or metabolic etiology of disease.  Urinalysis with reflex culture ordered to evaluate for UTI or relevant urologic/nephrologic pathology.  CT abd/pelvis to evaluate  for intra-abdominal pathology EKG to evaluate for cardiac pathology Symptomatic management Objective evaluation as reviewed   Initial Study Results:   Laboratory  All laboratory results reviewed without evidence of clinically relevant pathology.   Exceptions include: K3.3, calcium 8.8, albumin 3.4, hemoglobin 11.2, UA with moderate amounts of hemoglobin and trace leukocytes that does appear contaminated  EKG EKG was reviewed independently. ST segments without concerns for elevations.   EKG: normal sinus rhythm.   Radiology:  All images reviewed independently. Agree with radiology report at this time.   CT ABDOMEN PELVIS W CONTRAST  Result Date: 02/16/2023 CLINICAL DATA:  abdominal pain, acute, nonlocalized EXAM: CT ABDOMEN AND PELVIS WITH CONTRAST TECHNIQUE: Multidetector CT imaging of the abdomen and pelvis was performed using the standard protocol following bolus administration of intravenous contrast. RADIATION DOSE REDUCTION: This exam was performed according to the departmental dose-optimization program which includes automated exposure control, adjustment of the mA and/or kV according to patient size and/or use of iterative reconstruction technique. CONTRAST:  OMNIPAQUE IOHEXOL 300 MG/ML  SOLN COMPARISON:  None Available. FINDINGS: Lower chest: No acute abnormality. Coronary artery calcification. Tiny hiatal hernia. Hepatobiliary: No focal liver abnormality. Status post cholecystectomy. No biliary dilatation. Pancreas: No focal lesion. Normal pancreatic contour. No surrounding inflammatory changes. No main pancreatic ductal dilatation. Spleen: Normal in size without focal abnormality. Adrenals/Urinary Tract: No adrenal nodule bilaterally. Bilateral kidneys enhance symmetrically. No hydronephrosis. No hydroureter. The urinary bladder is unremarkable. On delayed imaging, there is no urothelial wall thickening and there are no filling defects in the opacified portions of the bilateral  collecting systems or ureters. Stomach/Bowel: Roux-en-Y gastric bypass surgical changes. Stomach is within normal limits. No evidence of bowel wall thickening or dilatation. The appendix is not definitely identified with no inflammatory changes in the right lower quadrant to suggest acute appendicitis. Vascular/Lymphatic: No abdominal aorta or iliac aneurysm. At least moderate atherosclerotic plaque of the aorta and its branches. No abdominal, pelvic, or inguinal lymphadenopathy. Reproductive: Status post hysterectomy. No adnexal masses. Other: No intraperitoneal free fluid. No intraperitoneal free gas. No organized fluid collection. Musculoskeletal: Tiny fat containing umbilical hernia. No suspicious lytic or blastic osseous lesions. No acute displaced fracture. Severe degenerative changes of the spine  with grade 1 anterolisthesis of L4 on L5. Associated bilateral L4 pars interarticularis defects. Plate and screw fixation of the radius on the right. IMPRESSION: 1. Tiny hiatal hernia. 2. Roux-en-Y gastric bypass and cholecystectomy. 3.  Aortic Atherosclerosis (ICD10-I70.0). Electronically Signed   By: Tish Frederickson M.D.   On: 02/16/2023 19:30      Final Assessment and Plan:   77 year old female presents to the ED complaining of dysuria and hematuria as well as some mild suprapubic abdominal pain.  On my initial exam, she does have a borderline blood pressure-systolic 90s over diastolic 60s.  Symptoms at home.  She has not had any volume losses.  Abdomen soft and nontender.  She is frail appearing but neurologically intact and ambulatory. BP responded well to bolus of fluids. Pt afebrile. UA appears contaminated but does have microscopic hematuria. Pt reports improvement in gross hematuria. Minimal hypokalemia, repleted. Appears to be chronically low. Hemoglobin 11.2, no large volume active blood loss, also appears to be intermittent chronically slightly anemic. Pt scheduled for B12 injection. No history of  bleeding disorders. No acute, emergent process on CT abdomen and pelvis. Discussed all findings in detail with pt and husband at bedside. No previous urologic evaluation.  Discussed that patient should closely follow-up with urology and primary care as well as monitor blood pressure at home.  Patient agreeable to do so.  Strict ED return precautions given, all questions answered, and stable for discharge.    Clinical Impression:  1. Generalized weakness   2. Hypotension, unspecified hypotension type   3. Gross hematuria   4. Hypokalemia      Discharge           Final Clinical Impression(s) / ED Diagnoses Final diagnoses:  Generalized weakness  Hypotension, unspecified hypotension type  Gross hematuria  Hypokalemia    Rx / DC Orders ED Discharge Orders     None         Tonette Lederer, PA-C 02/16/23 2021    Franne Forts, DO 02/24/23 1758

## 2023-02-16 NOTE — ED Triage Notes (Signed)
Patient presents to ED via POV from home. Ambulatory. Here with dysuria and hematuria that began on Saturday. Well appearing.

## 2023-02-16 NOTE — ED Notes (Signed)
Patient transported to CT 

## 2023-02-16 NOTE — Discharge Instructions (Addendum)
Thank you for letting us take care of you today.  Your labs were reassuring.  Your potassium was slightly low.  We gave you medication to replace this.  Otherwise your labs were reassuring.  We do see microscopic blood in your urine.  The CT scan did not show any emergent causes.  However, I believe that you should follow-up with urology for further assessment of this in the office.  The urine did not appear infected today so no indication for antibiotics.  I gave you information for a urologist that you may follow-up with or you may get a urologist to do on choosing.  Your blood pressure was also borderline low today.  You may no longer need your blood pressure medication.  Please measure your blood pressure daily and follow-up closely with your PCP to see if this medication is still indicated.  If your blood pressure is less than 130/80 I recommend holding your blood pressure medication unless otherwise instructed by PCP at your follow up appointment.  For any new or worsening condition, return to the nearest ED for reevaluation

## 2023-03-30 ENCOUNTER — Encounter: Payer: Self-pay | Admitting: Neurology

## 2023-03-30 ENCOUNTER — Ambulatory Visit: Payer: Medicare Other | Admitting: Neurology

## 2023-03-30 VITALS — BP 143/61 | HR 75 | Ht 62.0 in | Wt 160.0 lb

## 2023-03-30 DIAGNOSIS — I639 Cerebral infarction, unspecified: Secondary | ICD-10-CM

## 2023-03-30 DIAGNOSIS — E7849 Other hyperlipidemia: Secondary | ICD-10-CM

## 2023-03-30 DIAGNOSIS — G459 Transient cerebral ischemic attack, unspecified: Secondary | ICD-10-CM | POA: Diagnosis not present

## 2023-03-30 MED ORDER — ASPIRIN 81 MG PO TBEC: 81.0000 mg | DELAYED_RELEASE_TABLET | Freq: Every day | ORAL | 12 refills | Status: AC

## 2023-03-30 NOTE — Patient Instructions (Signed)
I had a long d/w patient and her husband about her recent TIA, risk for recurrent stroke/TIAs, personally independently reviewed imaging studies and stroke evaluation results and answered questions.Continue aspirin 81 mg daily  for secondary stroke prevention and maintain strict control of hypertension with blood pressure goal below 130/90, diabetes with hemoglobin A1c goal below 6.5% and lipids with LDL cholesterol goal below 70 mg/dL. I also advised the patient to eat a healthy diet with plenty of whole grains, cereals, fruits and vegetables, exercise regularly and maintain ideal body weight check lipid profile today.  Followup in the future with my nurse practitioner in 6 months or call earlier if necessary.  Stroke Prevention Some medical conditions and behaviors can lead to a higher chance of having a stroke. You can help prevent a stroke by eating healthy, exercising, not smoking, and managing any medical conditions you have. Stroke is a leading cause of functional impairment. Primary prevention is particularly important because a majority of strokes are first-time events. Stroke changes the lives of not only those who experience a stroke but also their family and other caregivers. How can this condition affect me? A stroke is a medical emergency and should be treated right away. A stroke can lead to brain damage and can sometimes be life-threatening. If a person gets medical treatment right away, there is a better chance of surviving and recovering from a stroke. What can increase my risk? The following medical conditions may increase your risk of a stroke: Cardiovascular disease. High blood pressure (hypertension). Diabetes. High cholesterol. Sickle cell disease. Blood clotting disorders (hypercoagulable state). Obesity. Sleep disorders (obstructive sleep apnea). Other risk factors include: Being older than age 72. Having a history of blood clots, stroke, or mini-stroke (transient ischemic  attack, TIA). Genetic factors, such as race, ethnicity, or a family history of stroke. Smoking cigarettes or using other tobacco products. Taking birth control pills, especially if you also use tobacco. Heavy use of alcohol or drugs, especially cocaine and methamphetamine. Physical inactivity. What actions can I take to prevent this? Manage your health conditions High cholesterol levels. Eating a healthy diet is important for preventing high cholesterol. If cholesterol cannot be managed through diet alone, you may need to take medicines. Take any prescribed medicines to control your cholesterol as told by your health care provider. Hypertension. To reduce your risk of stroke, try to keep your blood pressure below 130/80. Eating a healthy diet and exercising regularly are important for controlling blood pressure. If these steps are not enough to manage your blood pressure, you may need to take medicines. Take any prescribed medicines to control hypertension as told by your health care provider. Ask your health care provider if you should monitor your blood pressure at home. Have your blood pressure checked every year, even if your blood pressure is normal. Blood pressure increases with age and some medical conditions. Diabetes. Eating a healthy diet and exercising regularly are important parts of managing your blood sugar (glucose). If your blood sugar cannot be managed through diet and exercise, you may need to take medicines. Take any prescribed medicines to control your diabetes as told by your health care provider. Get evaluated for obstructive sleep apnea. Talk to your health care provider about getting a sleep evaluation if you snore a lot or have excessive sleepiness. Make sure that any other medical conditions you have, such as atrial fibrillation or atherosclerosis, are managed. Nutrition Follow instructions from your health care provider about what to eat or drink to  help manage your  health condition. These instructions may include: Reducing your daily calorie intake. Limiting how much salt (sodium) you use to 1,500 milligrams (mg) each day. Using only healthy fats for cooking, such as olive oil, canola oil, or sunflower oil. Eating healthy foods. You can do this by: Choosing foods that are high in fiber, such as whole grains, and fresh fruits and vegetables. Eating at least 5 servings of fruits and vegetables a day. Try to fill one-half of your plate with fruits and vegetables at each meal. Choosing lean protein foods, such as lean cuts of meat, poultry without skin, fish, tofu, beans, and nuts. Eating low-fat dairy products. Avoiding foods that are high in sodium. This can help lower blood pressure. Avoiding foods that have saturated fat, trans fat, and cholesterol. This can help prevent high cholesterol. Avoiding processed and prepared foods. Counting your daily carbohydrate intake.  Lifestyle If you drink alcohol: Limit how much you have to: 0-1 drink a day for women who are not pregnant. 0-2 drinks a day for men. Know how much alcohol is in your drink. In the [place] one drink equals one 12 oz bottle of beer ( ), one 5 oz glass of wine ( ), or one 1 oz glass of hard liquor (44mL). Do not use any products that contain nicotine or tobacco. These products include cigarettes, chewing tobacco, and vaping devices, such as e-cigarettes. If you need help quitting, ask your health care provider. Avoid secondhand smoke. Do not use drugs. Activity  Try to stay at a healthy weight. Get at least 30 minutes of exercise on most days, such as: Fast walking. Biking. Swimming. Medicines Take over-the-counter and prescription medicines only as told by your health care provider. Aspirin or blood thinners (antiplatelets or anticoagulants) may be recommended to reduce your risk of forming blood clots that can lead to stroke. Avoid taking birth control pills. Talk to your  health care provider about the risks of taking birth control pills if: You are over 70 years old. You smoke. You get very bad headaches. You have had a blood clot. Where to find more information American Stroke Association: www.strokeassociation.org Get help right away if: You or a loved one has any symptoms of a stroke. "BE FAST" is an easy way to remember the main warning signs of a stroke: B - Balance. Signs are dizziness, sudden trouble walking, or loss of balance. E - Eyes. Signs are trouble seeing or a sudden change in vision. F - Face. Signs are sudden weakness or numbness of the face, or the face or eyelid drooping on one side. A - Arms. Signs are weakness or numbness in an arm. This happens suddenly and usually on one side of the body. S - Speech. Signs are sudden trouble speaking, slurred speech, or trouble understanding what people say. T - Time. Time to call emergency services. Write down what time symptoms started. You or a loved one has other signs of a stroke, such as: A sudden, severe headache with no known cause. Nausea or vomiting. Seizure. These symptoms may represent a serious problem that is an emergency. Do not wait to see if the symptoms will go away. Get medical help right away. Call your local emergency services (911 in the [place] Do not drive yourself to the hospital. Summary You can help to prevent a stroke by eating healthy, exercising, not smoking, limiting alcohol intake, and managing any medical conditions you may have. Do not use any products that contain nicotine or  tobacco. These include cigarettes, chewing tobacco, and vaping devices, such as e-cigarettes. If you need help quitting, ask your health care provider. Remember "BE FAST" for warning signs of a stroke. Get help right away if you or a loved one has any of these signs. This information is not intended to replace advice given to you by your health care provider. Make sure you discuss any questions you  have with your health care provider. Document Revised: 07/13/2022 Document Reviewed: 07/13/2022 Elsevier Patient Education  2024 [employer].

## 2023-03-30 NOTE — Progress Notes (Signed)
[facility] [address] [place]. [place] [address] [phone]       OFFICE FOLLOW-UP NOTE  Ms. Jillian Morris Date of Birth:  04/09/1946 Medical Record Number:  [id number]   HPI: Jillian Morris is a 77 year old Caucasian lady seen today for initial office follow-up visit following hospital consultation for TIA and stroke in February 2024.  History is obtained from the patient and her husband as well as review of electronic medical records.  I personally reviewed pertinent available imaging films in PACS.  She has past medical history of bipolar disorder, dysphagia due to esophageal narrowing, history of TIAs, frequent falls, B12 deficiency, iron deficiency anemia, degenerative spine disease, s/p back surgery, mild cognitive impairment, Graves' disease, restless legs, hyperlipidemia, Sjogren's disease, traumatic subarachnoid hemorrhage in 2017.  She presented to the hospital on 10/20/2022 after being seen by ophthalmologist.  2 days prior patient had noticed a 20-minute episode of transient diplopia followed by some blurred vision.  Patient had also noticed some right greater than left fingertip numbness and tingling and mild posterior headache which resolved quickly within a few minutes.  CT head showed no acute abnormality.  CT angiogram of the head and neck showed mild atherosclerotic changes in the head and neck without large vessel stenosis or high-grade stenosis.  MRI of the brain showed incidental 2 mm right corona radiator lacunar infarct which was felt to be clinically silent.  2D echo showed ejection fraction of 60 to 65%.  LDL cholesterol was 122 mg percent.  Hemoglobin A1c was 5.3.  Patient was advised to take aspirin and Plavix for 3 weeks followed by aspirin alone.  She misunderstood this and is currently not on any antiplatelet agents.  She was started on Lipitor 80 mg daily which is tolerating well without muscle aches and pains.  She has not had any follow-up lipid  profile checked.  She has not had any recurrent TIA or stroke symptoms.  He has no new complaints today.  ROS:   14 system review of systems is positive for double vision, dizziness, gait ataxia all other systems negative  PMH:  Past Medical History:  Diagnosis Date   Abnormal gait    Achalasia 08/09/2013   Age-related osteoporosis without current pathological fracture 06/29/2017   Allergic rhinitis    Arthritis    Bipolar II disorder (HCC) 04/28/2018   Chronic kidney disease, stage 3b    Chronic otitis externa    Closed fracture of right distal radius    Closed fracture of zygomatic arch    DDD (degenerative disc disease), cervical 06/29/2017   DDD (degenerative disc disease), lumbar 06/29/2017   s/p laminectomy    Decreased estrogen level    Dysphagia    due to esophageal narrowing, able to take meds with thin liquid sif necessary    Essential hypertension    was briefly on losartan back early 2020 , had a severe reaction and was hospitalized , losartan was dc'd and she report shas not been prescribed any new bp meds as of yet, denies lasting issues from hospitalization    Essential tremor 06/29/2017   Fibromyalgia    Frequent falls    last fall was 03-09-2019 , my knee gave out. other times i feel like its vertigo . like a spacey feeling " . denies loc or loss of time , did not hit head    GAD (generalized anxiety disorder) 07/10/2018   Generalized abdominal pain    GERD (gastroesophageal reflux disease)  Graves disease    History of hiatal hernia    History of TIA (transient ischemic attack) 06/29/2017   History of total knee replacement, right 06/29/2017   Hypothyroidism    IBS (irritable bowel syndrome)    resolved with use of oxymorhpne    Insomnia 07/10/2018   Iron deficiency anemia    s/p gastric bypass, malabsorption   Joint pain    Lymphedema    Major depressive disorder    Meralgia paraesthetica    Migraine    Mild cognitive impairment of uncertain or  unknown etiology 08/03/2019   Pedal edema    Pharyngeal dysphagia    Pneumonia    Primary osteoarthritis of left knee 06/29/2017   Pure hypercholesterolemia    Restless legs    Right wrist fracture 04/20/2016   Rosacea    S/P dilation of esophageal narrowing    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   Sjogren's disease 06/29/2017   Stricture and stenosis of esophagus 03/30/2012   Vitamin B12 deficiency     Social History:  Social History   Socioeconomic History   Marital status: Married    Spouse name: Not on file   Number of children: 0   Years of education: 18   Highest education level: Master's degree (e.g., MA, MS, MEng, MEd, MSW, MBA)  Occupational History   Occupation: Retired    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 [employer]  Tobacco Use   Smoking status: Never   Smokeless tobacco: Never  Vaping Use   Vaping status: Never Used  Substance and Sexual Activity   Alcohol use: No    Alcohol/week: 0.0 standard drinks of alcohol   Drug use: No   Sexual activity: Not Currently  Other Topics Concern   Not on file  Social History Narrative   Right handed, Married, Step kids 2, Caffeine 4-5 cups daily, graduate school   Rare caffeine use    Social Determinants of Health   Financial Resource Strain: Low Risk  (12/06/2018)   Overall Financial Resource Strain (CARDIA)    Difficulty of Paying Living Expenses: Not hard at all  Food Insecurity: No Food Insecurity (01/22/2023)   Hunger Vital Sign    Worried About Running Out of Food in the Last Year: Never true    Ran Out of Food in the Last Year: Never true  Transportation Needs: No Transportation Needs (01/22/2023)   PRAPARE - Administrator, Civil Service (Medical): No    Lack of Transportation (Non-Medical): No  Physical Activity: Inactive (12/06/2018)   Exercise Vital Sign    Days of Exercise per Week: 0 days    Minutes of Exercise per Session: 0 min  Stress: No Stress Concern  Present (12/06/2018)   [employer] of Occupational Health - Occupational Stress Questionnaire    Feeling of Stress : Only a little  Social Connections: Socially Integrated (12/06/2018)   Social Connection and Isolation Panel [NHANES]    Frequency of Communication with Friends and Family: More than three times a week    Frequency of Social Gatherings with Friends and Family: Once a week    Attends Religious Services: More than 4 times per year    Active Member of [employer] or Organizations: Yes    Attends Banker Meetings: More than 4 times per year    Marital Status: Married  Catering manager Violence: Not At Risk (01/22/2023)   Humiliation, Afraid, Rape, and Kick questionnaire    Fear of Current  or Ex-Partner: No    Emotionally Abused: No    Physically Abused: No    Sexually Abused: No    Medications:   Current Outpatient Medications on File Prior to Visit  Medication Sig Dispense Refill   amLODipine (NORVASC) 5 MG tablet Take 5 mg by mouth daily.     atorvastatin (LIPITOR) 80 MG tablet Take 1 tablet (80 mg total) by mouth daily. 30 tablet 3   carboxymethylcellulose (REFRESH PLUS) 0.5 % SOLN Place 1 drop into both eyes 3 (three) times daily as needed (dry eyes).     diphenhydrAMINE (BENADRYL) 25 mg capsule Take 25 mg by mouth every 6 (six) hours as needed for allergies.     DULoxetine (CYMBALTA) 60 MG capsule TAKE 2 CAPSULES (120 MG TOTAL) BY MOUTH EVERY EVENING. (Patient taking differently: Take 60 mg by mouth 2 (two) times daily.) 180 capsule 0   ferrous sulfate 325 (65 FE) MG tablet Take 325 mg by mouth at bedtime.     HYDROmorphone (DILAUDID) 2 MG tablet Take 1 tablet (2 mg total) by mouth 3 (three) times daily as needed for pain. 90 tablet 0   hydrOXYzine (ATARAX) 10 MG tablet Take 10 mg by mouth at bedtime as needed (insomnia).     lamoTRIgine (LAMICTAL) 100 MG tablet Take 100 mg by mouth at bedtime.     LORazepam (ATIVAN) 0.5 MG tablet Take 0.5 mg by mouth at  bedtime.     methocarbamol (ROBAXIN) 500 MG tablet Take 500 mg by mouth 2 (two) times daily as needed for muscle spasms.     omeprazole (PRILOSEC) 40 MG capsule Take 40 mg by mouth in the morning and at bedtime.     Probiotic Product (PROBIOTIC DAILY PO) Take 1 capsule by mouth at bedtime.     rOPINIRole (REQUIP) 1 MG tablet Take 1 mg by mouth at bedtime as needed (restless leg syndrome).   6   SYNTHROID 100 MCG tablet Take 100 mcg by mouth daily before breakfast.     No current facility-administered medications on file prior to visit.    Allergies:   Allergies  Allergen Reactions   Cefuroxime Axetil Rash    Severe rash   Losartan Rash   Actonel [Risedronate Sodium]     GI Upset   Ambien [Zolpidem Tartrate] Other (See Comments)    Sleep-walking, hallucinations   Depakote [Divalproex Sodium]     Hallucinations   Inderal [Propranolol] Other (See Comments)    Hallucinations    Nsaids Other (See Comments)    Pt. Had gastric bypass surgery    Penicillins Other (See Comments)    Stomach upset     Silicon Other (See Comments)    Thyroid storm   Adhesive [Tape] Itching and Rash    Please use "paper" tape   Latex Swelling and Rash    Redness lips   Nickel Itching and Rash    Contact Dermatitis (intolerance)    Physical Exam General: Pleasant mildly overweight elderly Caucasian lady, seated, in no evident distress Head: head normocephalic and atraumatic.  Neck: supple with no carotid or supraclavicular bruits Cardiovascular: regular rate and rhythm, no murmurs Musculoskeletal: no deformity Skin:  no rash/petichiae Vascular:  Normal pulses all extremities Vitals:   03/30/23 1131  BP: (!) 143/61  Pulse: 75   Neurologic Exam Mental Status: Awake and fully alert. Oriented to place and time. Recent and remote memory intact. Attention span, concentration and fund of knowledge appropriate. Mood and affect appropriate.  Cranial Nerves:  Fundoscopic exam reveals sharp disc margins.  Pupils equal, briskly reactive to light. Extraocular movements full without nystagmus. Visual fields full to confrontation. Hearing intact. Facial sensation intact. Face, tongue, palate moves normally and symmetrically.  Motor: Normal bulk and tone. Normal strength in all tested extremity muscles. Sensory.: intact to touch ,pinprick .position and vibratory sensation.  Coordination: Rapid alternating movements normal in all extremities. Finger-to-nose and heel-to-shin performed accurately bilaterally. Gait and Station: Arises from chair without difficulty. Stance is normal. Gait demonstrates normal stride length and balance slightly unsteady when standing on either foot unsupported and standing on her heels.. Able to heel, toe and tandem walk with great difficulty.  Reflexes: 1+ and symmetric. Toes downgoing.   NIHSS  0 Modified Rankin  1   ASSESSMENT: 77 year old Caucasian lady with transient episode of diplopia and blurred vision in February 2024 likely posterior circulation TIA with silent right subcortical lacunar infarct from small vessel disease.     PLAN:I had a long d/w patient and her husband about her recent TIA, risk for recurrent stroke/TIAs, personally independently reviewed imaging studies and stroke evaluation results and answered questions.Continue aspirin 81 mg daily  for secondary stroke prevention and maintain strict control of hypertension with blood pressure goal below 130/90, diabetes with hemoglobin A1c goal below 6.5% and lipids with LDL cholesterol goal below 70 mg/dL. I also advised the patient to eat a healthy diet with plenty of whole grains, cereals, fruits and vegetables, exercise regularly and maintain ideal body weight check lipid profile today.  Followup in the future with my nurse practitioner in 6 months or call earlier if necessary. Greater than 50% of time during this 35 minute visit was spent on counseling,explanation of diagnosis of TIA, planning of further  management, discussion with patient and family and coordination of care Delia Heady, MD Note: This document was prepared with digital dictation and possible smart phrase technology. Any transcriptional errors that result from this process are unintentional

## 2023-03-31 ENCOUNTER — Telehealth: Payer: Self-pay | Admitting: Anesthesiology

## 2023-03-31 NOTE — Telephone Encounter (Signed)
-----   Message from Jillian Morris sent at 03/31/2023  2:02 PM EDT ----- Kindly inform the patient that cholesterol profile was satisfactory

## 2023-03-31 NOTE — Progress Notes (Signed)
Kindly inform the patient that cholesterol profile was satisfactory

## 2023-04-01 ENCOUNTER — Telehealth: Payer: Self-pay

## 2023-04-01 ENCOUNTER — Inpatient Hospital Stay: Payer: Medicare Other | Admitting: Neurology

## 2023-04-01 NOTE — Telephone Encounter (Signed)
-----   Message from Delia Heady sent at 03/31/2023  2:02 PM EDT ----- Kindly inform the patient that cholesterol profile was satisfactory

## 2023-04-01 NOTE — Telephone Encounter (Signed)
Called patient and informed her per Dr Pearlean Brownie "Kindly inform the patient that cholesterol profile was satisfactory " Pt verbalized understanding. Pt had no questions at this time but was encouraged to call back if questions arise.

## 2023-08-24 ENCOUNTER — Other Ambulatory Visit (HOSPITAL_BASED_OUTPATIENT_CLINIC_OR_DEPARTMENT_OTHER): Payer: Self-pay

## 2023-08-24 MED ORDER — HYDROMORPHONE HCL 2 MG PO TABS
2.0000 mg | ORAL_TABLET | Freq: Every day | ORAL | 0 refills | Status: DC | PRN
  Filled 2023-08-24: qty 150, 30d supply, fill #0

## 2023-09-13 DIAGNOSIS — M47816 Spondylosis without myelopathy or radiculopathy, lumbar region: Secondary | ICD-10-CM | POA: Diagnosis not present

## 2023-09-13 DIAGNOSIS — Z79891 Long term (current) use of opiate analgesic: Secondary | ICD-10-CM | POA: Diagnosis not present

## 2023-09-13 DIAGNOSIS — G894 Chronic pain syndrome: Secondary | ICD-10-CM | POA: Diagnosis not present

## 2023-09-13 DIAGNOSIS — M47812 Spondylosis without myelopathy or radiculopathy, cervical region: Secondary | ICD-10-CM | POA: Diagnosis not present

## 2023-09-28 ENCOUNTER — Other Ambulatory Visit (HOSPITAL_BASED_OUTPATIENT_CLINIC_OR_DEPARTMENT_OTHER): Payer: Self-pay

## 2023-09-28 MED ORDER — HYDROMORPHONE HCL 2 MG PO TABS
2.0000 mg | ORAL_TABLET | Freq: Four times a day (QID) | ORAL | 0 refills | Status: DC | PRN
  Filled 2023-09-28: qty 120, 30d supply, fill #0

## 2023-09-29 ENCOUNTER — Other Ambulatory Visit (HOSPITAL_BASED_OUTPATIENT_CLINIC_OR_DEPARTMENT_OTHER): Payer: Self-pay

## 2023-10-06 ENCOUNTER — Telehealth: Payer: Self-pay | Admitting: Neurology

## 2023-10-06 ENCOUNTER — Ambulatory Visit: Payer: No Typology Code available for payment source | Admitting: Adult Health

## 2023-10-06 NOTE — Telephone Encounter (Signed)
Appointment r/s pt having a bad reaction to a medication, she is on wait list

## 2023-10-07 DIAGNOSIS — E538 Deficiency of other specified B group vitamins: Secondary | ICD-10-CM | POA: Diagnosis not present

## 2023-10-07 DIAGNOSIS — K219 Gastro-esophageal reflux disease without esophagitis: Secondary | ICD-10-CM | POA: Diagnosis not present

## 2023-10-07 DIAGNOSIS — E7800 Pure hypercholesterolemia, unspecified: Secondary | ICD-10-CM | POA: Diagnosis not present

## 2023-10-07 DIAGNOSIS — N1832 Chronic kidney disease, stage 3b: Secondary | ICD-10-CM | POA: Diagnosis not present

## 2023-10-07 DIAGNOSIS — F09 Unspecified mental disorder due to known physiological condition: Secondary | ICD-10-CM | POA: Diagnosis not present

## 2023-10-07 DIAGNOSIS — E039 Hypothyroidism, unspecified: Secondary | ICD-10-CM | POA: Diagnosis not present

## 2023-10-07 DIAGNOSIS — R8281 Pyuria: Secondary | ICD-10-CM | POA: Diagnosis not present

## 2023-10-07 DIAGNOSIS — I679 Cerebrovascular disease, unspecified: Secondary | ICD-10-CM | POA: Diagnosis not present

## 2023-10-07 DIAGNOSIS — F319 Bipolar disorder, unspecified: Secondary | ICD-10-CM | POA: Diagnosis not present

## 2023-10-07 DIAGNOSIS — G2581 Restless legs syndrome: Secondary | ICD-10-CM | POA: Diagnosis not present

## 2023-10-07 DIAGNOSIS — I10 Essential (primary) hypertension: Secondary | ICD-10-CM | POA: Diagnosis not present

## 2023-10-07 DIAGNOSIS — Z0000 Encounter for general adult medical examination without abnormal findings: Secondary | ICD-10-CM | POA: Diagnosis not present

## 2023-10-07 DIAGNOSIS — G4700 Insomnia, unspecified: Secondary | ICD-10-CM | POA: Diagnosis not present

## 2023-10-12 DIAGNOSIS — M47812 Spondylosis without myelopathy or radiculopathy, cervical region: Secondary | ICD-10-CM | POA: Diagnosis not present

## 2023-10-12 DIAGNOSIS — M47816 Spondylosis without myelopathy or radiculopathy, lumbar region: Secondary | ICD-10-CM | POA: Diagnosis not present

## 2023-10-12 DIAGNOSIS — Z79891 Long term (current) use of opiate analgesic: Secondary | ICD-10-CM | POA: Diagnosis not present

## 2023-10-12 DIAGNOSIS — G894 Chronic pain syndrome: Secondary | ICD-10-CM | POA: Diagnosis not present

## 2023-11-02 ENCOUNTER — Other Ambulatory Visit (HOSPITAL_BASED_OUTPATIENT_CLINIC_OR_DEPARTMENT_OTHER): Payer: Self-pay

## 2023-11-05 ENCOUNTER — Other Ambulatory Visit (HOSPITAL_BASED_OUTPATIENT_CLINIC_OR_DEPARTMENT_OTHER): Payer: Self-pay

## 2023-11-08 ENCOUNTER — Other Ambulatory Visit (HOSPITAL_BASED_OUTPATIENT_CLINIC_OR_DEPARTMENT_OTHER): Payer: Self-pay

## 2023-11-08 MED ORDER — HYDROMORPHONE HCL 2 MG PO TABS
2.0000 mg | ORAL_TABLET | Freq: Four times a day (QID) | ORAL | 0 refills | Status: AC | PRN
  Filled 2023-11-08: qty 120, 30d supply, fill #0

## 2023-11-15 DIAGNOSIS — M810 Age-related osteoporosis without current pathological fracture: Secondary | ICD-10-CM | POA: Diagnosis not present

## 2023-11-15 DIAGNOSIS — N1832 Chronic kidney disease, stage 3b: Secondary | ICD-10-CM | POA: Diagnosis not present

## 2023-11-15 DIAGNOSIS — F419 Anxiety disorder, unspecified: Secondary | ICD-10-CM | POA: Diagnosis not present

## 2023-11-15 DIAGNOSIS — I10 Essential (primary) hypertension: Secondary | ICD-10-CM | POA: Diagnosis not present

## 2023-11-15 DIAGNOSIS — E039 Hypothyroidism, unspecified: Secondary | ICD-10-CM | POA: Diagnosis not present

## 2023-11-15 DIAGNOSIS — F09 Unspecified mental disorder due to known physiological condition: Secondary | ICD-10-CM | POA: Diagnosis not present

## 2023-11-15 DIAGNOSIS — F319 Bipolar disorder, unspecified: Secondary | ICD-10-CM | POA: Diagnosis not present

## 2023-11-16 ENCOUNTER — Other Ambulatory Visit (HOSPITAL_BASED_OUTPATIENT_CLINIC_OR_DEPARTMENT_OTHER): Payer: Self-pay

## 2023-11-16 DIAGNOSIS — Z79891 Long term (current) use of opiate analgesic: Secondary | ICD-10-CM | POA: Diagnosis not present

## 2023-11-16 DIAGNOSIS — M47816 Spondylosis without myelopathy or radiculopathy, lumbar region: Secondary | ICD-10-CM | POA: Diagnosis not present

## 2023-11-16 DIAGNOSIS — G894 Chronic pain syndrome: Secondary | ICD-10-CM | POA: Diagnosis not present

## 2023-11-16 DIAGNOSIS — M47812 Spondylosis without myelopathy or radiculopathy, cervical region: Secondary | ICD-10-CM | POA: Diagnosis not present

## 2023-11-16 MED ORDER — HYDROMORPHONE HCL 2 MG PO TABS
2.0000 mg | ORAL_TABLET | Freq: Four times a day (QID) | ORAL | 0 refills | Status: AC | PRN
  Filled 2023-12-07: qty 120, 30d supply, fill #0

## 2023-11-16 MED ORDER — HYDROMORPHONE HCL 2 MG PO TABS
2.0000 mg | ORAL_TABLET | Freq: Four times a day (QID) | ORAL | 0 refills | Status: AC | PRN
  Filled 2024-04-18: qty 120, 30d supply, fill #0

## 2023-11-23 ENCOUNTER — Other Ambulatory Visit (HOSPITAL_BASED_OUTPATIENT_CLINIC_OR_DEPARTMENT_OTHER): Payer: Self-pay

## 2023-11-23 ENCOUNTER — Telehealth: Payer: Self-pay | Admitting: Adult Health

## 2023-11-23 DIAGNOSIS — E039 Hypothyroidism, unspecified: Secondary | ICD-10-CM | POA: Diagnosis not present

## 2023-11-23 DIAGNOSIS — R450 Nervousness: Secondary | ICD-10-CM | POA: Diagnosis not present

## 2023-11-23 DIAGNOSIS — N1832 Chronic kidney disease, stage 3b: Secondary | ICD-10-CM | POA: Diagnosis not present

## 2023-11-23 DIAGNOSIS — R350 Frequency of micturition: Secondary | ICD-10-CM | POA: Diagnosis not present

## 2023-11-23 NOTE — Telephone Encounter (Signed)
 LVM and sent mychart msg informing pt of need to reschedule 02/24/24 appt - NP out

## 2023-11-25 ENCOUNTER — Other Ambulatory Visit (HOSPITAL_BASED_OUTPATIENT_CLINIC_OR_DEPARTMENT_OTHER): Payer: Self-pay

## 2023-12-03 ENCOUNTER — Other Ambulatory Visit (HOSPITAL_BASED_OUTPATIENT_CLINIC_OR_DEPARTMENT_OTHER): Payer: Self-pay

## 2023-12-07 ENCOUNTER — Other Ambulatory Visit (HOSPITAL_BASED_OUTPATIENT_CLINIC_OR_DEPARTMENT_OTHER): Payer: Self-pay

## 2024-01-03 DIAGNOSIS — R1310 Dysphagia, unspecified: Secondary | ICD-10-CM | POA: Diagnosis not present

## 2024-01-03 DIAGNOSIS — R42 Dizziness and giddiness: Secondary | ICD-10-CM | POA: Diagnosis not present

## 2024-01-05 ENCOUNTER — Other Ambulatory Visit (HOSPITAL_BASED_OUTPATIENT_CLINIC_OR_DEPARTMENT_OTHER): Payer: Self-pay

## 2024-01-05 MED ORDER — HYDROMORPHONE HCL 2 MG PO TABS
2.0000 mg | ORAL_TABLET | Freq: Four times a day (QID) | ORAL | 0 refills | Status: AC | PRN
  Filled 2024-01-05: qty 60, 15d supply, fill #0

## 2024-01-19 ENCOUNTER — Other Ambulatory Visit: Payer: Self-pay | Admitting: Gastroenterology

## 2024-01-19 ENCOUNTER — Other Ambulatory Visit (HOSPITAL_BASED_OUTPATIENT_CLINIC_OR_DEPARTMENT_OTHER): Payer: Self-pay

## 2024-01-19 DIAGNOSIS — G894 Chronic pain syndrome: Secondary | ICD-10-CM | POA: Diagnosis not present

## 2024-01-19 DIAGNOSIS — M47812 Spondylosis without myelopathy or radiculopathy, cervical region: Secondary | ICD-10-CM | POA: Diagnosis not present

## 2024-01-19 DIAGNOSIS — M47816 Spondylosis without myelopathy or radiculopathy, lumbar region: Secondary | ICD-10-CM | POA: Diagnosis not present

## 2024-01-19 DIAGNOSIS — Z79891 Long term (current) use of opiate analgesic: Secondary | ICD-10-CM | POA: Diagnosis not present

## 2024-01-19 MED ORDER — HYDROMORPHONE HCL 2 MG PO TABS
2.0000 mg | ORAL_TABLET | Freq: Four times a day (QID) | ORAL | 0 refills | Status: AC | PRN
  Filled 2024-01-19: qty 120, 30d supply, fill #0

## 2024-01-19 MED ORDER — HYDROMORPHONE HCL 2 MG PO TABS
2.0000 mg | ORAL_TABLET | Freq: Four times a day (QID) | ORAL | 0 refills | Status: AC | PRN
  Filled 2024-02-18: qty 120, 30d supply, fill #0

## 2024-02-16 ENCOUNTER — Telehealth (HOSPITAL_COMMUNITY): Payer: Self-pay

## 2024-02-16 ENCOUNTER — Encounter (HOSPITAL_COMMUNITY): Payer: Self-pay | Admitting: Gastroenterology

## 2024-02-16 NOTE — Progress Notes (Signed)
 Attempted to obtain medical history for pre op call via telephone, unable to reach at this time. HIPAA compliant voicemail message left requesting return call to pre surgical testing department.

## 2024-02-18 ENCOUNTER — Other Ambulatory Visit (HOSPITAL_BASED_OUTPATIENT_CLINIC_OR_DEPARTMENT_OTHER): Payer: Self-pay

## 2024-02-21 ENCOUNTER — Telehealth: Payer: Self-pay | Admitting: Neurology

## 2024-02-21 NOTE — Telephone Encounter (Signed)
 error

## 2024-02-21 NOTE — Telephone Encounter (Signed)
 Pt has called and r/s her appointment

## 2024-02-22 ENCOUNTER — Other Ambulatory Visit: Payer: Self-pay

## 2024-02-22 ENCOUNTER — Ambulatory Visit (HOSPITAL_COMMUNITY)
Admission: RE | Admit: 2024-02-22 | Discharge: 2024-02-22 | Disposition: A | Discharge status: Home / Self Care | Source: Ambulatory Visit | Attending: Gastroenterology | Admitting: Gastroenterology

## 2024-02-22 ENCOUNTER — Ambulatory Visit (HOSPITAL_COMMUNITY): Admitting: Anesthesiology

## 2024-02-22 ENCOUNTER — Encounter (HOSPITAL_COMMUNITY)
Admission: RE | Disposition: A | Discharge status: Home / Self Care | Payer: Self-pay | Source: Ambulatory Visit | Attending: Gastroenterology

## 2024-02-22 ENCOUNTER — Encounter (HOSPITAL_BASED_OUTPATIENT_CLINIC_OR_DEPARTMENT_OTHER): Payer: Self-pay | Admitting: Emergency Medicine

## 2024-02-22 ENCOUNTER — Emergency Department (HOSPITAL_BASED_OUTPATIENT_CLINIC_OR_DEPARTMENT_OTHER)

## 2024-02-22 ENCOUNTER — Emergency Department (HOSPITAL_BASED_OUTPATIENT_CLINIC_OR_DEPARTMENT_OTHER)
Admission: EM | Admit: 2024-02-22 | Discharge: 2024-02-22 | Discharge status: Against Medical Advice | Attending: Emergency Medicine | Admitting: Emergency Medicine

## 2024-02-22 DIAGNOSIS — Z91040 Latex allergy status: Secondary | ICD-10-CM | POA: Insufficient documentation

## 2024-02-22 DIAGNOSIS — K219 Gastro-esophageal reflux disease without esophagitis: Secondary | ICD-10-CM | POA: Diagnosis not present

## 2024-02-22 DIAGNOSIS — K220 Achalasia of cardia: Secondary | ICD-10-CM

## 2024-02-22 DIAGNOSIS — M25472 Effusion, left ankle: Secondary | ICD-10-CM | POA: Diagnosis not present

## 2024-02-22 DIAGNOSIS — R1310 Dysphagia, unspecified: Secondary | ICD-10-CM | POA: Diagnosis not present

## 2024-02-22 DIAGNOSIS — M1990 Unspecified osteoarthritis, unspecified site: Secondary | ICD-10-CM | POA: Diagnosis not present

## 2024-02-22 DIAGNOSIS — R42 Dizziness and giddiness: Secondary | ICD-10-CM | POA: Diagnosis not present

## 2024-02-22 DIAGNOSIS — Z79899 Other long term (current) drug therapy: Secondary | ICD-10-CM | POA: Insufficient documentation

## 2024-02-22 DIAGNOSIS — Z7982 Long term (current) use of aspirin: Secondary | ICD-10-CM | POA: Diagnosis not present

## 2024-02-22 DIAGNOSIS — I679 Cerebrovascular disease, unspecified: Secondary | ICD-10-CM | POA: Diagnosis not present

## 2024-02-22 DIAGNOSIS — I10 Essential (primary) hypertension: Secondary | ICD-10-CM | POA: Diagnosis not present

## 2024-02-22 DIAGNOSIS — E785 Hyperlipidemia, unspecified: Secondary | ICD-10-CM | POA: Diagnosis not present

## 2024-02-22 DIAGNOSIS — N1832 Chronic kidney disease, stage 3b: Secondary | ICD-10-CM

## 2024-02-22 DIAGNOSIS — M797 Fibromyalgia: Secondary | ICD-10-CM | POA: Diagnosis not present

## 2024-02-22 DIAGNOSIS — E039 Hypothyroidism, unspecified: Secondary | ICD-10-CM | POA: Diagnosis not present

## 2024-02-22 DIAGNOSIS — F419 Anxiety disorder, unspecified: Secondary | ICD-10-CM | POA: Diagnosis not present

## 2024-02-22 DIAGNOSIS — I129 Hypertensive chronic kidney disease with stage 1 through stage 4 chronic kidney disease, or unspecified chronic kidney disease: Secondary | ICD-10-CM | POA: Diagnosis not present

## 2024-02-22 DIAGNOSIS — K449 Diaphragmatic hernia without obstruction or gangrene: Secondary | ICD-10-CM | POA: Diagnosis not present

## 2024-02-22 DIAGNOSIS — M7989 Other specified soft tissue disorders: Secondary | ICD-10-CM | POA: Diagnosis not present

## 2024-02-22 DIAGNOSIS — I6782 Cerebral ischemia: Secondary | ICD-10-CM | POA: Diagnosis not present

## 2024-02-22 DIAGNOSIS — I672 Cerebral atherosclerosis: Secondary | ICD-10-CM | POA: Diagnosis not present

## 2024-02-22 DIAGNOSIS — Z8673 Personal history of transient ischemic attack (TIA), and cerebral infarction without residual deficits: Secondary | ICD-10-CM | POA: Diagnosis not present

## 2024-02-22 DIAGNOSIS — F319 Bipolar disorder, unspecified: Secondary | ICD-10-CM | POA: Insufficient documentation

## 2024-02-22 DIAGNOSIS — Z980 Intestinal bypass and anastomosis status: Secondary | ICD-10-CM | POA: Diagnosis not present

## 2024-02-22 HISTORY — PX: ESOPHAGOGASTRODUODENOSCOPY: SHX5428

## 2024-02-22 HISTORY — PX: BOTOX INJECTION: SHX5754

## 2024-02-22 LAB — COMPREHENSIVE METABOLIC PANEL WITH GFR
ALT: 33 U/L (ref 0–44)
AST: 31 U/L (ref 15–41)
Albumin: 3.9 g/dL (ref 3.5–5.0)
Alkaline Phosphatase: 129 U/L — ABNORMAL HIGH (ref 38–126)
Anion gap: 9 (ref 5–15)
BUN: 17 mg/dL (ref 8–23)
CO2: 24 mmol/L (ref 22–32)
Calcium: 8.5 mg/dL — ABNORMAL LOW (ref 8.9–10.3)
Chloride: 108 mmol/L (ref 98–111)
Creatinine, Ser: 0.96 mg/dL (ref 0.44–1.00)
GFR, Estimated: >60 mL/min (ref >60)
Glucose, Bld: 99 mg/dL (ref 70–99)
Potassium: 3.8 mmol/L (ref 3.5–5.1)
Sodium: 142 mmol/L (ref 135–145)
Total Bilirubin: 0.4 mg/dL (ref 0.0–1.2)
Total Protein: 5.6 g/dL — ABNORMAL LOW (ref 6.5–8.1)

## 2024-02-22 LAB — CBC
HCT: 32.5 % — ABNORMAL LOW (ref 36.0–46.0)
Hemoglobin: 10.5 g/dL — ABNORMAL LOW (ref 12.0–15.0)
MCH: 31.3 pg (ref 26.0–34.0)
MCHC: 32.3 g/dL (ref 30.0–36.0)
MCV: 96.7 fL (ref 80.0–100.0)
Platelets: 153 10*3/uL (ref 150–400)
RBC: 3.36 MIL/uL — ABNORMAL LOW (ref 3.87–5.11)
RDW: 13.1 % (ref 11.5–15.5)
WBC: 5.1 10*3/uL (ref 4.0–10.5)
nRBC: 0 % (ref 0.0–0.2)

## 2024-02-22 LAB — I-STAT CHEM 8, ED
BUN: 17 mg/dL (ref 8–23)
Calcium, Ion: 1.15 mmol/L (ref 1.15–1.40)
Chloride: 107 mmol/L (ref 98–111)
Creatinine, Ser: 1 mg/dL (ref 0.44–1.00)
Glucose, Bld: 97 mg/dL (ref 70–99)
HCT: 32 % — ABNORMAL LOW (ref 36.0–46.0)
Hemoglobin: 10.9 g/dL — ABNORMAL LOW (ref 12.0–15.0)
Potassium: 3.7 mmol/L (ref 3.5–5.1)
Sodium: 142 mmol/L (ref 135–145)
TCO2: 23 mmol/L (ref 22–32)

## 2024-02-22 LAB — CBG MONITORING, ED: Glucose-Capillary: 90 mg/dL (ref 70–99)

## 2024-02-22 SURGERY — EGD (ESOPHAGOGASTRODUODENOSCOPY)
Anesthesia: Monitor Anesthesia Care

## 2024-02-22 MED ORDER — MECLIZINE HCL 25 MG PO TABS
12.5000 mg | ORAL_TABLET | Freq: Once | ORAL | Status: AC
  Administered 2024-02-22: 12.5 mg via ORAL
  Filled 2024-02-22: qty 1

## 2024-02-22 MED ORDER — SODIUM CHLORIDE (PF) 0.9 % IJ SOLN
INTRAMUSCULAR | Status: AC
  Filled 2024-02-22: qty 10

## 2024-02-22 MED ORDER — ONABOTULINUMTOXINA 100 UNITS IJ SOLR
INTRAMUSCULAR | Status: AC
  Filled 2024-02-22: qty 100

## 2024-02-22 MED ORDER — IOHEXOL 350 MG/ML SOLN
75.0000 mL | Freq: Once | INTRAVENOUS | Status: AC | PRN
  Administered 2024-02-22: 75 mL via INTRAVENOUS

## 2024-02-22 MED ORDER — LACTATED RINGERS IV SOLN: INTRAVENOUS | Status: DC | PRN

## 2024-02-22 MED ORDER — SODIUM CHLORIDE (PF) 0.9 % IJ SOLN
INTRAMUSCULAR | Status: DC | PRN
  Administered 2024-02-22: 4 mL via SUBMUCOSAL

## 2024-02-22 MED ORDER — PROPOFOL 500 MG/50ML IV EMUL
INTRAVENOUS | Status: DC | PRN
  Administered 2024-02-22: 200 ug/kg/min via INTRAVENOUS

## 2024-02-22 MED ORDER — SODIUM CHLORIDE 0.9 % IV SOLN: INTRAVENOUS | Status: DC

## 2024-02-22 NOTE — ED Triage Notes (Signed)
 Dizziness x last night . Left ankle edema , hx lymphedema . Had procedure today at [facility] , Botox  injection to esophagus .  Wheeled to triage , reports noticed was unsteady gait yesterday .  Denies headache or vision change.  Alert and oriented x 4 .

## 2024-02-22 NOTE — Op Note (Signed)
 [facility] Patient Name: Jillian Morris Procedure Date: 02/22/2024 MRN: [id number] Attending MD: Jerrell JAYSON Sol , MD, [id number] Date of Birth: 28-Jan-1946 CSN: [id number] Age: 78 Admit Type: Outpatient Procedure:                Upper GI endoscopy Indications:              Dysphagia, Achalasia Providers:                Jerrell KYM Sol, MD, Jacquelyn Jaci Pierce,                            RN, Haskel Chris, Technician Referring MD:             Elsie Lesches Medicines:                Propofol  per Anesthesia, Monitored Anesthesia Care Complications:            No immediate complications. Estimated Blood Loss:     Estimated blood loss was minimal. Procedure:                Pre-Anesthesia Assessment:                           - Prior to the procedure, a History and Physical                            was performed, and patient medications and                            allergies were reviewed. The patient's tolerance of                            previous anesthesia was also reviewed. The risks                            and benefits of the procedure and the sedation                            options and risks were discussed with the patient.                            All questions were answered, and informed consent                            was obtained. Prior Anticoagulants: The patient has                            taken no anticoagulant or antiplatelet agents. ASA                            Grade Assessment: III - A patient with severe                            systemic disease. After reviewing the risks and  benefits, the patient was deemed in satisfactory                            condition to undergo the procedure.                           After obtaining informed consent, the endoscope was                            passed under direct vision. Throughout the                            procedure, the patient's blood  pressure, pulse, and                            oxygen saturations were monitored continuously. The                            [id number]-H190 ([id number]) Olympus endoscope was introduced                            through the mouth, and advanced to the second part                            of duodenum. The upper GI endoscopy was                            accomplished without difficulty. The patient                            tolerated the procedure well. Scope In: Scope Out: Findings:      The Z-line was regular and was found 38 cm from the incisors.      The examined esophagus was normal. Area was successfully injected with 4       mL botulinum toxin for achalasia. Estimated blood loss was minimal.      Evidence of a Roux-en-Y gastrojejunostomy was found. The gastrojejunal       anastomosis was characterized by healthy appearing mucosa. This was       traversed. The pouch-to-jejunum limb was characterized by healthy       appearing mucosa. The jejunojejunal anastomosis was characterized by       healthy appearing mucosa.      The examined jejunum was normal. Impression:               - Z-line regular, 38 cm from the incisors.                           - Normal esophagus. Injected.                           - Roux-en-Y gastrojejunostomy with gastrojejunal                            anastomosis characterized by healthy appearing  mucosa.                           - Normal examined jejunum.                           - No specimens collected. Moderate Sedation:      N/A - MAC procedure Recommendation:           - Patient has a contact number available for                            emergencies. The signs and symptoms of potential                            delayed complications were discussed with the                            patient. Return to normal activities tomorrow.                            Written discharge instructions were provided to the                             patient.                           - Advance diet as tolerated. Procedure Code(s):        --- Professional ---                           [phone], Esophagogastroduodenoscopy, flexible,                            transoral; diagnostic, including collection of                            specimen(s) by brushing or washing, when performed                            (separate procedure) Diagnosis Code(s):        --- Professional ---                           K22.0, Achalasia of cardia                           R13.10, Dysphagia, unspecified                           Z98.0, Intestinal bypass and anastomosis status CPT copyright 2022 [employer]. All rights reserved. The codes documented in this report are preliminary and upon coder review may  be revised to meet current compliance requirements. Jerrell JAYSON Sol, MD 02/22/2024 9:55:31 AM This report has been signed electronically. Number of Addenda: 0

## 2024-02-22 NOTE — Transfer of Care (Signed)
 Immediate Anesthesia Transfer of Care Note  Patient: Jillian Morris  Procedure(s) Performed: EGD (ESOPHAGOGASTRODUODENOSCOPY) BOTOX  INJECTION  Patient Location: PACU  Anesthesia Type:MAC  Level of Consciousness: sedated  Airway & Oxygen Therapy: Patient Spontanous Breathing  Post-op Assessment: Report given to RN  Post vital signs: Reviewed and stable  Last Vitals:  Vitals Value Taken Time  BP 139/60 02/22/24 09:53  Temp 36.2 C 02/22/24 09:53  Pulse 70 02/22/24 09:55  Resp 14 02/22/24 09:55  SpO2 98 % 02/22/24 09:55  Vitals shown include unfiled device data.  Last Pain:  Vitals:   02/22/24 0953  TempSrc: Temporal  PainSc: Asleep         Complications: No notable events documented.

## 2024-02-22 NOTE — Discharge Instructions (Signed)
YOU HAD AN ENDOSCOPIC PROCEDURE TODAY: Refer to the procedure report and other information in the discharge instructions given to you for any specific questions about what was found during the examination. If this information does not answer your questions, please call [facility] office at [phone] to clarify.   YOU SHOULD EXPECT: Some feelings of bloating in the abdomen. Passage of more gas than usual. Walking can help get rid of the air that was put into your GI tract during the procedure and reduce the bloating. If you had a lower endoscopy (such as a colonoscopy or flexible sigmoidoscopy) you may notice spotting of blood in your stool or on the toilet paper. Some abdominal soreness may be present for a day or two, also.  DIET: Your first meal following the procedure should be a light meal and then it is ok to progress to your normal diet. A half-sandwich or bowl of soup is an example of a good first meal. Heavy or fried foods are harder to digest and may make you feel nauseous or bloated. Drink plenty of fluids but you should avoid alcoholic beverages for 24 hours. If you had a esophageal dilation, please see attached instructions for diet.    ACTIVITY: Your care partner should take you home directly after the procedure. You should plan to take it easy, moving slowly for the rest of the day. You can resume normal activity the day after the procedure however YOU SHOULD NOT DRIVE, use power tools, machinery or perform tasks that involve climbing or major physical exertion for 24 hours (because of the sedation medicines used during the test).   SYMPTOMS TO REPORT IMMEDIATELY: A gastroenterologist can be reached at any hour. Please call [phone]  for any of the following symptoms:  Following upper endoscopy (EGD, EUS, ERCP, esophageal dilation) Vomiting of blood or coffee ground material  New, significant abdominal pain  New, significant chest pain or pain under the shoulder blades  Painful or  persistently difficult swallowing  New shortness of breath  Black, tarry-looking or red, bloody stools  FOLLOW UP:  If any biopsies were taken you will be contacted by phone or by letter within the next 1-3 weeks. Call [phone]  if you have not heard about the biopsies in 3 weeks.  Please also call with any specific questions about appointments or follow up tests. YOU HAD AN ENDOSCOPIC PROCEDURE TODAY: Refer to the procedure report and other information in the discharge instructions given to you for any specific questions about what was found during the examination. If this information does not answer your questions, please call [facility] office at [phone] to clarify.   

## 2024-02-22 NOTE — Consult Note (Signed)
 Date of Initial H&P: 02/17/24  History reviewed, patient examined, no change in status, stable for surgery.

## 2024-02-22 NOTE — Anesthesia Preprocedure Evaluation (Addendum)
 Anesthesia Evaluation  Patient identified by MRN, date of birth, ID band Patient awake    Reviewed: Allergy & Precautions, NPO status , Patient's Chart, lab work & pertinent test results  Airway Mallampati: II  TM Distance: >3 FB Neck ROM: Full    Dental no notable dental hx. (+) Teeth Intact, Dental Advisory Given   Pulmonary neg pulmonary ROS   Pulmonary exam normal breath sounds clear to auscultation       Cardiovascular hypertension, Pt. on medications Normal cardiovascular exam Rhythm:Regular Rate:Normal  TTE 2024 1. Left ventricular ejection fraction, by estimation, is 60 to 65%. The  left ventricle has normal function. The left ventricle has no regional  wall motion abnormalities. Left ventricular diastolic parameters were  normal.   2. Right ventricular systolic function is normal. The right ventricular  size is normal.   3. The mitral valve is normal in structure. No evidence of mitral valve  regurgitation. No evidence of mitral stenosis.   4. The aortic valve is normal in structure. Aortic valve regurgitation is  not visualized. No aortic stenosis is present.   5. The inferior vena cava is normal in size with greater than 50%  respiratory variability, suggesting right atrial pressure of 3 mmHg.   6. No agitatated saline / bubble study performed.     Neuro/Psych  Headaches PSYCHIATRIC DISORDERS Anxiety Depression Bipolar Disorder   CVA    GI/Hepatic Neg liver ROS, hiatal hernia,GERD  ,,  Endo/Other  Hypothyroidism    Renal/GU Renal InsufficiencyRenal disease  negative genitourinary   Musculoskeletal  (+) Arthritis ,  Fibromyalgia -  Abdominal   Peds  Hematology negative hematology ROS (+)   Anesthesia Other Findings Sjogen's  Reproductive/Obstetrics                             Anesthesia Physical Anesthesia Plan  ASA: 3  Anesthesia Plan: MAC   Post-op Pain Management:     Induction: Intravenous  PONV Risk Score and Plan: Propofol  infusion and Treatment may vary due to age or medical condition  Airway Management Planned: Natural Airway  Additional Equipment:   Intra-op Plan:   Post-operative Plan:   Informed Consent: I have reviewed the patients History and Physical, chart, labs and discussed the procedure including the risks, benefits and alternatives for the proposed anesthesia with the patient or authorized representative who has indicated his/her understanding and acceptance.     Dental advisory given  Plan Discussed with: CRNA  Anesthesia Plan Comments:        Anesthesia Quick Evaluation

## 2024-02-22 NOTE — Interval H&P Note (Signed)
 History and Physical Interval Note:  02/22/2024 9:30 AM  Jillian Morris  has presented today for surgery, with the diagnosis of Dysphagia.  The various methods of treatment have been discussed with the patient and family. After consideration of risks, benefits and other options for treatment, the patient has consented to  Procedure(s): EGD (ESOPHAGOGASTRODUODENOSCOPY) (N/A) BOTOX  INJECTION (N/A) as a surgical intervention.  The patient's history has been reviewed, patient examined, no change in status, stable for surgery.  I have reviewed the patient's chart and labs.  Questions were answered to the patient's satisfaction.     Jerrell JAYSON Sol

## 2024-02-22 NOTE — H&P (View-Only) (Signed)
 Date of Initial H&P: 02/17/24  History reviewed, patient examined, no change in status, stable for surgery.

## 2024-02-22 NOTE — Anesthesia Postprocedure Evaluation (Signed)
 Anesthesia Post Note  Patient: Jillian Morris  Procedure(s) Performed: EGD (ESOPHAGOGASTRODUODENOSCOPY) BOTOX  INJECTION     Patient location during evaluation: Endoscopy Anesthesia Type: MAC Level of consciousness: awake and alert Pain management: pain level controlled Vital Signs Assessment: post-procedure vital signs reviewed and stable Respiratory status: spontaneous breathing, nonlabored ventilation, respiratory function stable and patient connected to nasal cannula oxygen Cardiovascular status: blood pressure returned to baseline and stable Postop Assessment: no apparent nausea or vomiting Anesthetic complications: no  No notable events documented.  Last Vitals:  Vitals:   02/22/24 1000 02/22/24 1011  BP: 129/65 130/65  Pulse: 70 66  Resp: (!) 26 16  Temp:    SpO2: 92% 96%    Last Pain:  Vitals:   02/22/24 1011  TempSrc:   PainSc: 0-No pain                 Kermitt Harjo L Llewelyn Sheaffer

## 2024-02-22 NOTE — ED Provider Notes (Signed)
 [place] [facility] Provider Note   CSN: [id number] Arrival date & time: 02/22/24  1555     Patient presents with: Dizziness   Jillian Morris is a 78 y.o. female.  {Add pertinent medical, surgical, social history, OB history to HPI:5214} 78 year old female with a history of stroke and TIA on aspirin , traumatic subarachnoid hemorrhage, hyperlipidemia, and achalasia who presents emergency department with dizziness.  Patient reports that this morning she woke up and then at 8:30 AM started feeling what she describes as vertigo.  Says that she had an EGD with Botox  today as well and after waking up at 10:30 AM started feeling dizzy again.  Repeatedly calls her vertigo but describes it as more of a lightheaded sensation worsen with turning her head and standing.  No double vision, difficulty speaking, difficulty swallowing.  Also reports noticing that her left leg was swollen today as well.  No chest pain or shortness of breath.  Not on any blood thinners.       Prior to Admission medications   Medication Sig Start Date End Date Taking? Authorizing Provider  amLODipine  (NORVASC ) 5 MG tablet Take 5 mg by mouth daily. 05/05/19   [provider]  aspirin  EC 81 MG tablet Take 1 tablet (81 mg total) by mouth daily. Swallow whole. 03/30/23   Rosemarie Eather RAMAN, MD  atorvastatin  (LIPITOR ) 80 MG tablet Take 1 tablet (80 mg total) by mouth daily. 10/22/22   Krishnan, Gokul, MD  carboxymethylcellulose (REFRESH PLUS) 0.5 % SOLN Place 1 drop into both eyes 3 (three) times daily as needed (dry eyes).    [provider]  diphenhydrAMINE  (BENADRYL ) 25 mg capsule Take 25 mg by mouth every 6 (six) hours as needed for allergies.    [provider]  DULoxetine  (CYMBALTA ) 60 MG capsule TAKE 2 CAPSULES (120 MG TOTAL) BY MOUTH EVERY EVENING. Patient taking differently: Take 60 mg by mouth 2 (two) times daily. 07/17/19   Cottle, Lorene KANDICE Raddle., MD  ferrous  sulfate 325 (65 FE) MG tablet Take 325 mg by mouth at bedtime.    [provider]  HYDROmorphone  (DILAUDID ) 2 MG tablet Take 1 tablet (2 mg total) by mouth 3 (three) times daily as needed for pain. 11/16/22     HYDROmorphone  (DILAUDID ) 2 MG tablet Take 1 tablet (2 mg total) by mouth 4 (four) times daily as needed for pain. 11/08/23     HYDROmorphone  (DILAUDID ) 2 MG tablet Take 1 tablet (2 mg total) by mouth 4 (four) times daily as needed for pain. 01/04/24     HYDROmorphone  (DILAUDID ) 2 MG tablet Take 1 tablet (2 mg total) by mouth 4 (four) times daily as needed for pain. 12/07/23     HYDROmorphone  (DILAUDID ) 2 MG tablet Take 1 tablet (2 mg total) by mouth 4 (four) times daily as needed. 01/05/24     HYDROmorphone  (DILAUDID ) 2 MG tablet Take 1 tablet (2 mg total) by mouth 4 (four) times daily as needed for pain. 01/19/24     HYDROmorphone  (DILAUDID ) 2 MG tablet Take 1 tablet (2 mg total) by mouth 4 (four) times daily as needed for pain. 02/17/24     hydrOXYzine  (ATARAX ) 10 MG tablet Take 10 mg by mouth at bedtime as needed (insomnia).    [provider]  lamoTRIgine  (LAMICTAL ) 100 MG tablet Take 100 mg by mouth at bedtime.    [provider]  LORazepam  (ATIVAN ) 0.5 MG tablet Take 0.5 mg by mouth at  bedtime.    [provider]  methocarbamol  (ROBAXIN ) 500 MG tablet Take 500 mg by mouth 2 (two) times daily as needed for muscle spasms.    [provider]  omeprazole  (PRILOSEC) 40 MG capsule Take 40 mg by mouth in the morning and at bedtime. 09/13/20   [provider]  Probiotic Product (PROBIOTIC DAILY PO) Take 1 capsule by mouth at bedtime.    [provider]  rOPINIRole  (REQUIP ) 1 MG tablet Take 1 mg by mouth at bedtime as needed (restless leg syndrome).  02/22/16   [provider]  SYNTHROID  100 MCG tablet Take 100 mcg by mouth daily before breakfast.    [provider]    Allergies: Cefuroxime axetil, Losartan, Actonel  [risedronate sodium], Ambien [zolpidem tartrate], Depakote [divalproex sodium], Inderal [propranolol], Nsaids, Penicillins, Silicon, Adhesive [tape], Latex, and Nickel    Review of Systems  Updated Vital Signs BP (!) 125/58 (BP Location: Left Arm)   Pulse 79   Temp 98.2 F (36.8 C) (Oral)   Resp 17   Wt 73 kg   SpO2 94%   BMI 29.44 kg/m   Physical Exam Vitals and nursing note reviewed.  Constitutional:      General: She is not in acute distress.    Appearance: She is well-developed.  HENT:     Head: Normocephalic and atraumatic.     Right Ear: External ear normal.     Left Ear: External ear normal.     Nose: Nose normal.   Eyes:     Extraocular Movements: Extraocular movements intact.     Conjunctiva/sclera: Conjunctivae normal.     Pupils: Pupils are equal, round, and reactive to light.    Cardiovascular:     Rate and Rhythm: Normal rate and regular rhythm.     Heart sounds: Murmur heard.  Pulmonary:     Effort: Pulmonary effort is normal. No respiratory distress.     Breath sounds: Normal breath sounds.   Musculoskeletal:     Cervical back: Normal range of motion and neck supple.     Right lower leg: No edema.     Left lower leg: No edema.   Skin:    General: Skin is warm and dry.   Neurological:     Mental Status: She is alert.     Comments: NIHSS Exam  Level of Consciousness: Alert  LOC Questions: Answers Month and Age Correctly  LOC Commands: Opens and Closes Eyes and Hands on command  Best Gaze: Horizontal ocular movements intact  Visual Fields: No visual field loss  Facial Palsy: None  L Upper Extremity Motor: No drift after 10 seconds  R Upper Extremity Motor: No drift after 10 seconds  L Lower extremity Motor: No drift after 5 seconds  R Lower extremity Motor: No drift after 5 seconds  Ataxia: Normal finger-nose and heel-to-shin bilaterally.  Unstable and walking without assistance. Sensory: Intact sensation to light touch on face, arms, trunk,  and legs bilaterally  Best Language: No aphasia  Dysarthria: No dysarthria  Neglect: No visual or sensory neglect    Psychiatric:        Mood and Affect: Mood normal.     (all labs ordered are listed, but only abnormal results are displayed) Labs Reviewed - No data to display  EKG: None  Radiology: No results found.  {Document cardiac monitor, telemetry assessment procedure when appropriate:32947} .Ultrasound ED Peripheral IV (Provider)  Date/Time: 02/22/2024 5:13 PM  Performed by: Yolande Lamar BROCKS, MD Authorized  by: Yolande Lamar BROCKS, MD   Procedure details:    Indications: poor IV access     Skin Prep: chlorhexidine  gluconate     Location:  Left anterior forearm   Angiocath:  18 G   Bedside Ultrasound Guided: Yes     Images: not archived     Patient tolerated procedure without complications: Yes     Dressing applied: Yes      Medications Ordered in the ED - No data to display  Clinical Course as of 02/22/24 1958  Tue Feb 22, 2024  1953 Dr Franklyn has accepted the patient for MRI at [facility]. [RP]    Clinical Course User Index [RP] Yolande Lamar BROCKS, MD   {Click here for ABCD2, HEART and other calculators REFRESH Note before signing:1}                              Medical Decision Making Amount and/or Complexity of Data Reviewed Labs: ordered. Radiology: ordered.  Risk Prescription drug management.   ***  {Document critical care time when appropriate  Document review of labs and clinical decision tools ie CHADS2VASC2, etc  Document your independent review of radiology images and any outside records  Document your discussion with family members, caretakers and with consultants  Document social determinants of health affecting pt's care  Document your decision making why or why not admission, treatments were needed:32947:::1}   Final diagnoses:  None    ED Discharge Orders     None

## 2024-02-22 NOTE — ED Notes (Signed)
 Pt laid flat at 1828 for orthostatic vitals

## 2024-02-22 NOTE — Discharge Instructions (Signed)
 You were seen for your dizziness in the emergency department.   At home, please take meclizine  as needed for your dizziness.    Check your MyChart online for the results of any tests that had not resulted by the time you left the emergency department.   Follow-up with your primary doctor in 2-3 days regarding your visit.  Follow-up with your neurologist as soon as possible to see if you need a stroke workup.  Return immediately to the emergency department if you experience any of the following: Worsening dizziness, vision changes, difficulty speaking or swallowing, or any other concerning symptoms.    Thank you for visiting our Emergency Department. It was a pleasure taking care of you today.

## 2024-02-24 ENCOUNTER — Ambulatory Visit: Payer: No Typology Code available for payment source | Admitting: Adult Health

## 2024-03-02 ENCOUNTER — Telehealth: Payer: Self-pay | Admitting: Neurology

## 2024-03-02 ENCOUNTER — Institutional Professional Consult (permissible substitution): Admitting: Neurology

## 2024-03-02 NOTE — Telephone Encounter (Signed)
 Request to reschedule appointment, spouse reports pt is sick

## 2024-03-15 ENCOUNTER — Other Ambulatory Visit (HOSPITAL_BASED_OUTPATIENT_CLINIC_OR_DEPARTMENT_OTHER): Payer: Self-pay

## 2024-03-15 DIAGNOSIS — M47816 Spondylosis without myelopathy or radiculopathy, lumbar region: Secondary | ICD-10-CM | POA: Diagnosis not present

## 2024-03-15 DIAGNOSIS — G894 Chronic pain syndrome: Secondary | ICD-10-CM | POA: Diagnosis not present

## 2024-03-15 DIAGNOSIS — Z79891 Long term (current) use of opiate analgesic: Secondary | ICD-10-CM | POA: Diagnosis not present

## 2024-03-15 DIAGNOSIS — M47812 Spondylosis without myelopathy or radiculopathy, cervical region: Secondary | ICD-10-CM | POA: Diagnosis not present

## 2024-03-15 MED ORDER — HYDROMORPHONE HCL 2 MG PO TABS
2.0000 mg | ORAL_TABLET | Freq: Four times a day (QID) | ORAL | 0 refills | Status: AC | PRN
  Filled 2024-09-04: qty 120, 30d supply, fill #0

## 2024-03-15 MED ORDER — HYDROMORPHONE HCL 2 MG PO TABS
2.0000 mg | ORAL_TABLET | Freq: Four times a day (QID) | ORAL | 0 refills | Status: AC | PRN
  Filled 2024-03-17: qty 120, 30d supply, fill #0

## 2024-03-17 ENCOUNTER — Other Ambulatory Visit (HOSPITAL_BASED_OUTPATIENT_CLINIC_OR_DEPARTMENT_OTHER): Payer: Self-pay

## 2024-04-04 DIAGNOSIS — K219 Gastro-esophageal reflux disease without esophagitis: Secondary | ICD-10-CM | POA: Diagnosis not present

## 2024-04-04 DIAGNOSIS — F319 Bipolar disorder, unspecified: Secondary | ICD-10-CM | POA: Diagnosis not present

## 2024-04-04 DIAGNOSIS — E039 Hypothyroidism, unspecified: Secondary | ICD-10-CM | POA: Diagnosis not present

## 2024-04-04 DIAGNOSIS — N1832 Chronic kidney disease, stage 3b: Secondary | ICD-10-CM | POA: Diagnosis not present

## 2024-04-04 DIAGNOSIS — E162 Hypoglycemia, unspecified: Secondary | ICD-10-CM | POA: Diagnosis not present

## 2024-04-04 DIAGNOSIS — D519 Vitamin B12 deficiency anemia, unspecified: Secondary | ICD-10-CM | POA: Diagnosis not present

## 2024-04-04 DIAGNOSIS — F09 Unspecified mental disorder due to known physiological condition: Secondary | ICD-10-CM | POA: Diagnosis not present

## 2024-04-04 DIAGNOSIS — G4700 Insomnia, unspecified: Secondary | ICD-10-CM | POA: Diagnosis not present

## 2024-04-04 DIAGNOSIS — I10 Essential (primary) hypertension: Secondary | ICD-10-CM | POA: Diagnosis not present

## 2024-04-04 DIAGNOSIS — E7800 Pure hypercholesterolemia, unspecified: Secondary | ICD-10-CM | POA: Diagnosis not present

## 2024-04-04 DIAGNOSIS — G2581 Restless legs syndrome: Secondary | ICD-10-CM | POA: Diagnosis not present

## 2024-04-18 ENCOUNTER — Other Ambulatory Visit (HOSPITAL_BASED_OUTPATIENT_CLINIC_OR_DEPARTMENT_OTHER): Payer: Self-pay

## 2024-04-20 NOTE — Progress Notes (Deleted)
 [facility] [address] [place]. [place] [address] [phone]       OFFICE FOLLOW-UP NOTE  Jillian Morris Date of Birth:  10-21-1945 Medical Record Number:  [id number]    Primary neurologist: Dr. Rosemarie Reason for visit: TIA follow-up    HPI:    Update 04/25/2024 JM: Patient returns for follow-up visit after prior visit with Dr. Rosemarie over 1 year ago (multiple rescheduled visits).   She was seen in the med [facility] ED back in July for onset of dizziness after EGD.  No other neurological symptoms and noted intact neuroexam.  Suspected dizziness in setting of recent procedure and anesthesia.  CTA head/neck without acute abnormality.  Recommended transfer to complete MRI but patient declined and left AMA.        Initial visit 03/30/2023 Dr. Rosemarie: Jillian Morris is a 78 year old Caucasian lady seen today for initial office follow-up visit following hospital consultation for TIA and stroke in February 2024.  History is obtained from the patient and her husband as well as review of electronic medical records.  I personally reviewed pertinent available imaging films in PACS.  She has past medical history of bipolar disorder, dysphagia due to esophageal narrowing, history of TIAs, frequent falls, B12 deficiency, iron deficiency anemia, degenerative spine disease, s/p back surgery, mild cognitive impairment, Graves' disease, restless legs, hyperlipidemia, Sjogren's disease, traumatic subarachnoid hemorrhage in 2017.  She presented to the hospital on 10/20/2022 after being seen by ophthalmologist.  2 days prior patient had noticed a 20-minute episode of transient diplopia followed by some blurred vision.  Patient had also noticed some right greater than left fingertip numbness and tingling and mild posterior headache which resolved quickly within a few minutes.  CT head showed no acute abnormality.  CT angiogram of the head and neck showed mild atherosclerotic changes  in the head and neck without large vessel stenosis or high-grade stenosis.  MRI of the brain showed incidental 2 mm right corona radiator lacunar infarct which was felt to be clinically silent.  2D echo showed ejection fraction of 60 to 65%.  LDL cholesterol was 122 mg percent.  Hemoglobin A1c was 5.3.  Patient was advised to take aspirin  and Plavix  for 3 weeks followed by aspirin  alone.  She misunderstood this and is currently not on any antiplatelet agents.  She was started on Lipitor  80 mg daily which is tolerating well without muscle aches and pains.  She has not had any follow-up lipid profile checked.  She has not had any recurrent TIA or stroke symptoms.  He has no new complaints today.  ROS:   14 system review of systems is positive for double vision, dizziness, gait ataxia all other systems negative  PMH:  Past Medical History:  Diagnosis Date   Abnormal gait    Achalasia 08/09/2013   Age-related osteoporosis without current pathological fracture 06/29/2017   Allergic rhinitis    Arthritis    Bipolar II disorder (HCC) 04/28/2018   Chronic kidney disease, stage 3b    Chronic otitis externa    Closed fracture of right distal radius    Closed fracture of zygomatic arch    DDD (degenerative disc disease), cervical 06/29/2017   DDD (degenerative disc disease), lumbar 06/29/2017   s/p laminectomy    Decreased estrogen level    Dysphagia    due to esophageal narrowing, able to take meds with thin liquid sif necessary    Essential hypertension    was briefly on losartan  back early 2020 , had a severe reaction and was hospitalized , losartan was dc'd and she report shas not been prescribed any new bp meds as of yet, denies lasting issues from hospitalization    Essential tremor 06/29/2017   Fibromyalgia    Frequent falls    last fall was 03-09-2019 , my knee gave out. other times i feel like its vertigo . like a spacey feeling  . denies loc or loss of time , did not hit head    GAD  (generalized anxiety disorder) 07/10/2018   Generalized abdominal pain    GERD (gastroesophageal reflux disease)    Graves disease    History of hiatal hernia    History of TIA (transient ischemic attack) 06/29/2017   History of total knee replacement, right 06/29/2017   Hypothyroidism    IBS (irritable bowel syndrome)    resolved with use of oxymorhpne    Insomnia 07/10/2018   Iron deficiency anemia    s/p gastric bypass, malabsorption   Joint pain    Lymphedema    Major depressive disorder    Meralgia paraesthetica    Migraine    Mild cognitive impairment of uncertain or unknown etiology 08/03/2019   Pedal edema    Pharyngeal dysphagia    Pneumonia    Primary osteoarthritis of left knee 06/29/2017   Pure hypercholesterolemia    Restless legs    Right wrist fracture 04/20/2016   Rosacea    S/P dilation of esophageal narrowing    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   Sjogren's disease 06/29/2017   Stricture and stenosis of esophagus 03/30/2012   Vitamin B12 deficiency     Social History:  Social History   Socioeconomic History   Marital status: Married    Spouse name: Not on file   Number of children: 0   Years of education: 18   Highest education level: Master's degree (e.g., MA, MS, MEng, MEd, MSW, MBA)  Occupational History   Occupation: Retired    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 [employer]  Tobacco Use   Smoking status: Never   Smokeless tobacco: Never  Vaping Use   Vaping status: Never Used  Substance and Sexual Activity   Alcohol  use: No    Alcohol /week: 0.0 standard drinks of alcohol    Drug use: No   Sexual activity: Not Currently  Other Topics Concern   Not on file  Social History Narrative   Right handed, Married, Step kids 2, Caffeine 4-5 cups daily, graduate school   Rare caffeine use    Social Drivers of Corporate investment banker Strain: Low Risk  (12/06/2018)   Overall Financial Resource Strain (CARDIA)     Difficulty of Paying Living Expenses: Not hard at all  Food Insecurity: No Food Insecurity (01/22/2023)   Hunger Vital Sign    Worried About Running Out of Food in the Last Year: Never true    Ran Out of Food in the Last Year: Never true  Transportation Needs: No Transportation Needs (01/22/2023)   PRAPARE - Administrator, Civil Service (Medical): No    Lack of Transportation (Non-Medical): No  Physical Activity: Inactive (12/06/2018)   Exercise Vital Sign    Days of Exercise per Week: 0 days    Minutes of Exercise per Session: 0 min  Stress: No Stress Concern Present (12/06/2018)   [employer] of Occupational Health - Occupational Stress Questionnaire    Feeling of Stress : Only a little  Social Connections: Socially Integrated (12/06/2018)   Social Connection and Isolation Panel    Frequency of Communication with Friends and Family: More than three times a week    Frequency of Social Gatherings with Friends and Family: Once a week    Attends Religious Services: More than 4 times per year    Active Member of [employer] or Organizations: Yes    Attends Engineer, structural: More than 4 times per year    Marital Status: Married  Catering manager Violence: Not At Risk (01/22/2023)   Humiliation, Afraid, Rape, and Kick questionnaire    Fear of Current or Ex-Partner: No    Emotionally Abused: No    Physically Abused: No    Sexually Abused: No    Medications:   Current Outpatient Medications on File Prior to Visit  Medication Sig Dispense Refill   amLODipine  (NORVASC ) 5 MG tablet Take 5 mg by mouth daily.     aspirin  EC 81 MG tablet Take 1 tablet (81 mg total) by mouth daily. Swallow whole. 30 tablet 12   atorvastatin  (LIPITOR ) 80 MG tablet Take 1 tablet (80 mg total) by mouth daily. 30 tablet 3   carboxymethylcellulose (REFRESH PLUS) 0.5 % SOLN Place 1 drop into both eyes 3 (three) times daily as needed (dry eyes).     diphenhydrAMINE  (BENADRYL ) 25 mg capsule Take  25 mg by mouth every 6 (six) hours as needed for allergies.     DULoxetine  (CYMBALTA ) 60 MG capsule TAKE 2 CAPSULES (120 MG TOTAL) BY MOUTH EVERY EVENING. (Patient taking differently: Take 60 mg by mouth 2 (two) times daily.) 180 capsule 0   ferrous sulfate  325 (65 FE) MG tablet Take 325 mg by mouth at bedtime.     HYDROmorphone  (DILAUDID ) 2 MG tablet Take 1 tablet (2 mg total) by mouth 3 (three) times daily as needed for pain. 90 tablet 0   HYDROmorphone  (DILAUDID ) 2 MG tablet Take 1 tablet (2 mg total) by mouth 4 (four) times daily as needed for pain. 120 tablet 0   HYDROmorphone  (DILAUDID ) 2 MG tablet Take 1 tablet (2 mg total) by mouth 4 (four) times daily as needed for pain. 120 tablet 0   HYDROmorphone  (DILAUDID ) 2 MG tablet Take 1 tablet (2 mg total) by mouth 4 (four) times daily as needed for pain. 120 tablet 0   HYDROmorphone  (DILAUDID ) 2 MG tablet Take 1 tablet (2 mg total) by mouth 4 (four) times daily as needed. 60 tablet 0   HYDROmorphone  (DILAUDID ) 2 MG tablet Take 1 tablet (2 mg total) by mouth 4 (four) times daily as needed for pain. 120 tablet 0   HYDROmorphone  (DILAUDID ) 2 MG tablet Take 1 tablet (2 mg total) by mouth 4 (four) times daily as needed for pain. 120 tablet 0   HYDROmorphone  (DILAUDID ) 2 MG tablet Take 1 tablet (2 mg total) by mouth 4 (four) times daily as needed for pain. 120 tablet 0   HYDROmorphone  (DILAUDID ) 2 MG tablet 1 tablet by mouth four times a day as needed for pain 120 tablet 0   hydrOXYzine  (ATARAX ) 10 MG tablet Take 10 mg by mouth at bedtime as needed (insomnia).     lamoTRIgine  (LAMICTAL ) 100 MG tablet Take 100 mg by mouth at bedtime.     LORazepam  (ATIVAN ) 0.5 MG tablet Take 0.5 mg by mouth at bedtime.     methocarbamol  (ROBAXIN ) 500 MG tablet Take 500 mg by mouth 2 (two) times daily as needed for muscle spasms.  omeprazole  (PRILOSEC) 40 MG capsule Take 40 mg by mouth in the morning and at bedtime.     Probiotic Product (PROBIOTIC DAILY PO) Take 1  capsule by mouth at bedtime.     rOPINIRole  (REQUIP ) 1 MG tablet Take 1 mg by mouth at bedtime as needed (restless leg syndrome).   6   SYNTHROID  100 MCG tablet Take 100 mcg by mouth daily before breakfast.     No current facility-administered medications on file prior to visit.    Allergies:   Allergies  Allergen Reactions   Cefuroxime Axetil Rash    Severe rash   Losartan Rash   Actonel [Risedronate Sodium]     GI Upset   Ambien [Zolpidem Tartrate] Other (See Comments)    Sleep-walking, hallucinations   Depakote [Divalproex Sodium]     Hallucinations   Inderal [Propranolol] Other (See Comments)    Hallucinations    Nsaids Other (See Comments)    Pt. Had gastric bypass surgery    Penicillins Other (See Comments)    Stomach upset     Silicon Other (See Comments)    Thyroid  storm   Adhesive [Tape] Itching and Rash    Please use paper tape   Latex Swelling and Rash    Redness lips   Nickel Itching and Rash    Contact Dermatitis (intolerance)    Physical Exam There were no vitals filed for this visit. There is no height or weight on file to calculate BMI.   General: Pleasant mildly overweight elderly Caucasian lady, seated, in no evident distress Head: head normocephalic and atraumatic.  Neck: supple with no carotid or supraclavicular bruits Cardiovascular: regular rate and rhythm, no murmurs Musculoskeletal: no deformity Skin:  no rash/petichiae Vascular:  Normal pulses all extremities  Neurologic Exam Mental Status: Awake and fully alert. Oriented to place and time. Recent and remote memory intact. Attention span, concentration and fund of knowledge appropriate. Mood and affect appropriate.  Cranial Nerves: Pupils equal, briskly reactive to light. Extraocular movements full without nystagmus. Visual fields full to confrontation. Hearing intact. Facial sensation intact. Face, tongue, palate moves normally and symmetrically.  Motor: Normal bulk and tone. Normal  strength in all tested extremity muscles. Sensory.: intact to touch ,pinprick .position and vibratory sensation.  Coordination: Rapid alternating movements normal in all extremities. Finger-to-nose and heel-to-shin performed accurately bilaterally. Gait and Station: Arises from chair without difficulty. Stance is normal. Gait demonstrates normal stride length and balance slightly unsteady when standing on either foot unsupported and standing on her heels.. Able to heel, toe and tandem walk with great difficulty.  Reflexes: 1+ and symmetric. Toes downgoing.        ASSESSMENT: Jillian Morris is a 78 year old Caucasian lady with transient episode of diplopia and blurred vision in February 2024 likely posterior circulation TIA with silent right subcortical lacunar infarct from small vessel disease.     PLAN:  - Continue aspirin  81 mg daily and atorvastatin  80 mg daily for secondary stroke prevention managed/prescribed by PCP - Continue close PCP follow-up for aggressive stroke risk factor management       I personally spent a total of *** minutes in the care of the patient today including {Time Based Coding:210964241}.  Harlene Bogaert, AGNP-BC  [facility] [address] [place], [place] [address]  Phone [phone] Fax [phone] Note: This document was prepared with digital dictation and possible smart phrase technology. Any transcriptional errors that result from this process are unintentional.

## 2024-04-25 ENCOUNTER — Ambulatory Visit: Admitting: Adult Health

## 2024-05-17 DIAGNOSIS — R42 Dizziness and giddiness: Secondary | ICD-10-CM | POA: Diagnosis not present

## 2024-05-17 DIAGNOSIS — K219 Gastro-esophageal reflux disease without esophagitis: Secondary | ICD-10-CM | POA: Diagnosis not present

## 2024-05-17 DIAGNOSIS — G2581 Restless legs syndrome: Secondary | ICD-10-CM | POA: Diagnosis not present

## 2024-05-17 DIAGNOSIS — F319 Bipolar disorder, unspecified: Secondary | ICD-10-CM | POA: Diagnosis not present

## 2024-05-17 DIAGNOSIS — E039 Hypothyroidism, unspecified: Secondary | ICD-10-CM | POA: Diagnosis not present

## 2024-05-17 DIAGNOSIS — F5101 Primary insomnia: Secondary | ICD-10-CM | POA: Diagnosis not present

## 2024-05-17 DIAGNOSIS — E7800 Pure hypercholesterolemia, unspecified: Secondary | ICD-10-CM | POA: Diagnosis not present

## 2024-05-17 DIAGNOSIS — R739 Hyperglycemia, unspecified: Secondary | ICD-10-CM | POA: Diagnosis not present

## 2024-05-17 DIAGNOSIS — R0789 Other chest pain: Secondary | ICD-10-CM | POA: Diagnosis not present

## 2024-05-17 DIAGNOSIS — I10 Essential (primary) hypertension: Secondary | ICD-10-CM | POA: Diagnosis not present

## 2024-05-18 ENCOUNTER — Other Ambulatory Visit (HOSPITAL_BASED_OUTPATIENT_CLINIC_OR_DEPARTMENT_OTHER): Payer: Self-pay

## 2024-05-18 ENCOUNTER — Other Ambulatory Visit: Payer: Self-pay

## 2024-05-18 MED ORDER — HYDROMORPHONE HCL 2 MG PO TABS
2.0000 mg | ORAL_TABLET | Freq: Four times a day (QID) | ORAL | 0 refills | Status: AC | PRN
  Filled 2024-05-18: qty 60, 15d supply, fill #0

## 2024-05-19 ENCOUNTER — Other Ambulatory Visit (HOSPITAL_BASED_OUTPATIENT_CLINIC_OR_DEPARTMENT_OTHER): Payer: Self-pay

## 2024-05-22 ENCOUNTER — Telehealth: Payer: Self-pay | Admitting: Neurology

## 2024-05-22 ENCOUNTER — Institutional Professional Consult (permissible substitution): Admitting: Neurology

## 2024-05-22 NOTE — Telephone Encounter (Signed)
Pt cx due to a conflict

## 2024-06-01 ENCOUNTER — Other Ambulatory Visit (HOSPITAL_BASED_OUTPATIENT_CLINIC_OR_DEPARTMENT_OTHER): Payer: Self-pay

## 2024-06-01 ENCOUNTER — Other Ambulatory Visit: Payer: Self-pay

## 2024-06-01 DIAGNOSIS — M47812 Spondylosis without myelopathy or radiculopathy, cervical region: Secondary | ICD-10-CM | POA: Diagnosis not present

## 2024-06-01 DIAGNOSIS — Z79891 Long term (current) use of opiate analgesic: Secondary | ICD-10-CM | POA: Diagnosis not present

## 2024-06-01 DIAGNOSIS — G894 Chronic pain syndrome: Secondary | ICD-10-CM | POA: Diagnosis not present

## 2024-06-01 DIAGNOSIS — M47816 Spondylosis without myelopathy or radiculopathy, lumbar region: Secondary | ICD-10-CM | POA: Diagnosis not present

## 2024-06-01 MED ORDER — METHOCARBAMOL 500 MG PO TABS
500.0000 mg | ORAL_TABLET | Freq: Two times a day (BID) | ORAL | 1 refills | Status: AC | PRN
  Filled 2024-06-01: qty 180, 90d supply, fill #0

## 2024-06-01 MED ORDER — HYDROMORPHONE HCL 2 MG PO TABS
2.0000 mg | ORAL_TABLET | Freq: Four times a day (QID) | ORAL | 0 refills | Status: AC | PRN
  Filled 2024-07-04: qty 120, 30d supply, fill #0

## 2024-06-01 MED ORDER — HYDROMORPHONE HCL 2 MG PO TABS
2.0000 mg | ORAL_TABLET | Freq: Four times a day (QID) | ORAL | 0 refills | Status: AC | PRN
  Filled 2024-06-01: qty 120, 30d supply, fill #0

## 2024-06-02 DIAGNOSIS — H04123 Dry eye syndrome of bilateral lacrimal glands: Secondary | ICD-10-CM | POA: Diagnosis not present

## 2024-06-02 DIAGNOSIS — H35033 Hypertensive retinopathy, bilateral: Secondary | ICD-10-CM | POA: Diagnosis not present

## 2024-06-02 DIAGNOSIS — H26493 Other secondary cataract, bilateral: Secondary | ICD-10-CM | POA: Diagnosis not present

## 2024-06-07 ENCOUNTER — Encounter: Payer: Self-pay | Admitting: Cardiovascular Disease

## 2024-06-07 ENCOUNTER — Ambulatory Visit: Attending: Cardiovascular Disease | Admitting: Cardiovascular Disease

## 2024-06-07 VITALS — BP 128/71 | HR 73 | Ht 62.0 in | Wt 160.0 lb

## 2024-06-07 DIAGNOSIS — R072 Precordial pain: Secondary | ICD-10-CM | POA: Diagnosis not present

## 2024-06-07 DIAGNOSIS — R0789 Other chest pain: Secondary | ICD-10-CM | POA: Insufficient documentation

## 2024-06-07 DIAGNOSIS — E7800 Pure hypercholesterolemia, unspecified: Secondary | ICD-10-CM

## 2024-06-07 DIAGNOSIS — I10 Essential (primary) hypertension: Secondary | ICD-10-CM

## 2024-06-07 DIAGNOSIS — R079 Chest pain, unspecified: Secondary | ICD-10-CM

## 2024-06-07 MED ORDER — METOPROLOL TARTRATE 100 MG PO TABS: ORAL_TABLET | ORAL | 0 refills | Status: AC

## 2024-06-07 NOTE — Progress Notes (Signed)
 06/07/2024 Jillian Morris   1946/01/14  [id number]  Primary Physician Arloa Elsie SAUNDERS, MD Primary Cardiologist: Dorn JINNY Lesches MD [facility] [place], [place]  HPI:  Jillian Morris is a 78 y.o. thin-appearing married Caucasian female mother of 2 children.  Mother of 1 grandchild who was referred by her primary care provider for evaluation of chest pain.  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  Her risk factors include treated hypertension and hyperlipidemia.  Her father did have a heart attack in his 74s and her sister died at age 40 of a myocardial infarction.  She apparently had a stroke 2 years ago and has no neurologic residual.  Her other problems include fibromyalgia, hypothyroidism and cervical and lumbar spinal stenosis.  She has had gastric bypass in the past.  She has had chest pain for the last several weeks occurring on a daily basis lasting minutes at a time which has been increasing in frequency.  She has taken her husband's sublingual nitroglycerin which has provided some relief.   Current Meds  Medication Sig   amLODipine  (NORVASC ) 5 MG tablet Take 5 mg by mouth daily.   aspirin  EC 81 MG tablet Take 1 tablet (81 mg total) by mouth daily. Swallow whole.   atorvastatin  (LIPITOR ) 80 MG tablet Take 1 tablet (80 mg total) by mouth daily.   busPIRone (BUSPAR) 5 MG tablet Take 5 mg by mouth 2 (two) times daily.   carboxymethylcellulose (REFRESH PLUS) 0.5 % SOLN Place 1 drop into both eyes 3 (three) times daily as needed (dry eyes).   diphenhydrAMINE  (BENADRYL ) 25 mg capsule Take 25 mg by mouth every 6 (six) hours as needed for allergies.   DULoxetine  (CYMBALTA ) 60 MG capsule TAKE 2 CAPSULES (120 MG TOTAL) BY MOUTH EVERY EVENING. (Patient taking differently: Take 60 mg by mouth 2 (two) times daily.)   ferrous sulfate  325 (65 FE) MG tablet Take 325 mg by mouth at bedtime.   HYDROmorphone  (DILAUDID ) 2 MG tablet Take 1 tablet  (2 mg total) by mouth 3 (three) times daily as needed for pain.   HYDROmorphone  (DILAUDID ) 2 MG tablet Take 1 tablet (2 mg total) by mouth 4 (four) times daily as needed for pain.   HYDROmorphone  (DILAUDID ) 2 MG tablet Take 1 tablet (2 mg total) by mouth 4 (four) times daily as needed for pain.   HYDROmorphone  (DILAUDID ) 2 MG tablet Take 1 tablet (2 mg total) by mouth 4 (four) times daily as needed for pain.   HYDROmorphone  (DILAUDID ) 2 MG tablet Take 1 tablet (2 mg total) by mouth 4 (four) times daily as needed.   HYDROmorphone  (DILAUDID ) 2 MG tablet Take 1 tablet (2 mg total) by mouth 4 (four) times daily as needed for pain.   HYDROmorphone  (DILAUDID ) 2 MG tablet Take 1 tablet (2 mg total) by mouth 4 (four) times daily as needed for pain.   HYDROmorphone  (DILAUDID ) 2 MG tablet Take 1 tablet (2 mg total) by mouth 4 (four) times daily as needed for pain.   HYDROmorphone  (DILAUDID ) 2 MG tablet 1 tablet by mouth four times a day as needed for pain   HYDROmorphone  (DILAUDID ) 2 MG tablet Take 1 tablet (2 mg total) by mouth 4 (four) times daily as needed for pain.   HYDROmorphone  (DILAUDID ) 2 MG tablet Take 1 tablet (2 mg total) by mouth 4 (four) times daily as needed for pain,   [START ON 06/30/2024] HYDROmorphone  (  DILAUDID ) 2 MG tablet 1 tablet by mouth four times a day as needed for pain   hydrOXYzine  (ATARAX ) 10 MG tablet Take 10 mg by mouth at bedtime as needed (insomnia).   lamoTRIgine  (LAMICTAL ) 100 MG tablet Take 100 mg by mouth at bedtime.   LORazepam  (ATIVAN ) 0.5 MG tablet Take 0.5 mg by mouth at bedtime.   metFORMIN (GLUCOPHAGE-XR) 500 MG 24 hr tablet Take 500 mg by mouth daily with breakfast.   methocarbamol  (ROBAXIN ) 500 MG tablet Take 500 mg by mouth 2 (two) times daily as needed for muscle spasms.   methocarbamol  (ROBAXIN ) 500 MG tablet Take 1 tablet (500 mg total) by mouth 2 (two) times daily as needed.   omeprazole  (PRILOSEC) 40 MG capsule Take 40 mg by mouth in the morning and at  bedtime.   Probiotic Product (PROBIOTIC DAILY PO) Take 1 capsule by mouth at bedtime.   rOPINIRole  (REQUIP ) 1 MG tablet Take 1 mg by mouth at bedtime as needed (restless leg syndrome).    SYNTHROID  100 MCG tablet Take 100 mcg by mouth daily before breakfast.   traZODone (DESYREL) 100 MG tablet Take 100 mg by mouth 2 (two) times daily.     Allergies  Allergen Reactions   Cefuroxime Axetil Rash    Severe rash   Losartan Rash   Actonel [Risedronate Sodium]     GI Upset   Ambien [Zolpidem Tartrate] Other (See Comments)    Sleep-walking, hallucinations   Depakote [Divalproex Sodium]     Hallucinations   Inderal [Propranolol] Other (See Comments)    Hallucinations    Nsaids Other (See Comments)    Pt. 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 [employer]  Tobacco Use   Smoking status: Never   Smokeless tobacco: Never  Vaping Use   Vaping status: Never Used  Substance and Sexual Activity   Alcohol  use: No    Alcohol /week: 0.0 standard drinks of alcohol    Drug use: No   Sexual activity: Not Currently  Other Topics Concern   Not on file  Social History Narrative   Right handed, Married, Step kids 2, Caffeine 4-5 cups daily, graduate school   Rare caffeine use    Social Drivers of Health   Financial Resource Strain: Low Risk  (12/06/2018)   Overall Financial Resource Strain  (CARDIA)    Difficulty of Paying Living Expenses: Not hard at all  Food Insecurity: No Food Insecurity (01/22/2023)   Hunger Vital Sign    Worried About Running Out of Food in the Last Year: Never true    Ran Out of Food in the Last Year: Never true  Transportation Needs: No Transportation Needs (01/22/2023)   PRAPARE - Transportation    Lack of Transportation (Medical): No    Lack  of Transportation (Non-Medical): No  Physical Activity: Inactive (12/06/2018)   Exercise Vital Sign    Days of Exercise per Week: 0 days    Minutes of Exercise per Session: 0 min  Stress: No Stress Concern Present (12/06/2018)   [employer] of Occupational Health - Occupational Stress Questionnaire    Feeling of Stress : Only a little  Social Connections: Socially Integrated (12/06/2018)   Social Connection and Isolation Panel    Frequency of Communication with Friends and Family: More than three times a week    Frequency of Social Gatherings with Friends and Family: Once a week    Attends Religious Services: More than 4 times per year    Active Member of [employer] or Organizations: Yes    Attends Engineer, structural: More than 4 times per year    Marital Status: Married  Catering manager Violence: Not At Risk (01/22/2023)   Humiliation, Afraid, Rape, and Kick questionnaire    Fear of Current or Ex-Partner: No    Emotionally Abused: No    Physically Abused: No    Sexually Abused: No     Review of Systems: General: negative for chills, fever, night sweats or weight changes.  Cardiovascular: negative for chest pain, dyspnea on exertion, edema, orthopnea, palpitations, paroxysmal nocturnal dyspnea or shortness of breath Dermatological: negative for rash Respiratory: negative for cough or wheezing Urologic: negative for hematuria Abdominal: negative for nausea, vomiting, diarrhea, bright red blood per rectum, melena, or hematemesis Neurologic: negative for visual changes, syncope, or  dizziness All other systems reviewed and are otherwise negative except as noted above.    Blood pressure 128/71, pulse 73, height 5' 2 (1.575 m), weight 160 lb (72.6 kg), SpO2 94%.  General appearance: alert and no distress Neck: no adenopathy, no carotid bruit, no JVD, supple, symmetrical, trachea midline, and thyroid  not enlarged, symmetric, no tenderness/mass/nodules Lungs: clear to auscultation bilaterally Heart: regular rate and rhythm, S1, S2 normal, no murmur, click, rub or gallop Extremities: extremities normal, atraumatic, no cyanosis or edema Pulses: 2+ and symmetric Skin: Skin color, texture, turgor normal. No rashes or lesions Neurologic: Grossly normal  EKG EKG Interpretation Date/Time:  Wednesday June 07 2024 14:19:02 EDT Ventricular Rate:  71 PR Interval:  138 QRS Duration:  82 QT Interval:  408 QTC Calculation: 443 R Axis:   -35  Text Interpretation: Normal sinus rhythm Left axis deviation Minimal voltage criteria for LVH, may be normal variant ( R in aVL ) When compared with ECG of 22-Feb-2024 19:27, PREVIOUS ECG IS PRESENT Confirmed by Court Carrier [phone]) on 06/07/2024 2:30:55 PM    ASSESSMENT AND PLAN:   Essential hypertension History of essential hypertension blood pressure measured today at 128/71.  She is on amlodipine .  Pure hypercholesterolemia History of hyperlipidemia on high-dose statin therapy with lipid profile performed 10/07/2023 revealing total cholesterol 171, LDL 71 and HDL of 84.  Atypical chest pain Patient was referred by her primary care provider for atypical chest pain.  She was seen by Dr. Sheena  3 years ago for chest pain as well although she says her symptoms are different.  Workup was never pursued.  She does have a history of fibromyalgia.  She has had chest pain for 2 or 3 weeks occurring on a daily basis lasting for minutes at a time and relieved with sublingual nitroglycerin which are her husband's medication.  I am going get a  coronary CTA to further evaluate.     Carrier DOROTHA Court MD FACP,FACC,FAHA, FSCAI  06/07/2024 2:47 PM

## 2024-06-07 NOTE — Assessment & Plan Note (Signed)
 History of essential hypertension blood pressure measured today at 128/71.  She is on amlodipine .

## 2024-06-07 NOTE — Assessment & Plan Note (Signed)
 Patient was referred by her primary care provider for atypical chest pain.  She was seen by Dr. Sheena  3 years ago for chest pain as well although she says her symptoms are different.  Workup was never pursued.  She does have a history of fibromyalgia.  She has had chest pain for 2 or 3 weeks occurring on a daily basis lasting for minutes at a time and relieved with sublingual nitroglycerin which are her husband's medication.  I am going get a coronary CTA to further evaluate.

## 2024-06-07 NOTE — Patient Instructions (Addendum)
 Medication Instructions:  Your physician recommends that you continue on your current medications as directed. Please refer to the Current Medication list given to you today.  *If you need a refill on your cardiac medications before your next appointment, please call your pharmacy*  Testing/Procedures: See below  Follow-Up: At [facility], you and your health needs are our priority.  As part of our continuing mission to provide you with exceptional heart care, our providers are all part of one team.  This team includes your primary Cardiologist (physician) and Advanced Practice Providers or APPs (Physician Assistants and Nurse Practitioners) who all work together to provide you with the care you need, when you need it.  Your next appointment:   We will see you on an as needed basis.   Provider:   Dorn Lesches, MD     We recommend signing up for the patient portal called MyChart.  Sign up information is provided on this After Visit Summary.  MyChart is used to connect with patients for Virtual Visits (Telemedicine).  Patients are able to view lab/test results, encounter notes, upcoming appointments, etc.  Non-urgent messages can be sent to your provider as well.   To learn more about what you can do with MyChart, go to [email or web address].   Other Instructions   Your cardiac CT will be scheduled at the below location:   [facility] [address]  [place], [place] [address] [phone]   If scheduled at the Heart and Vascular Tower at [facility], please enter the parking lot using the [address] entrance and use the FREE valet service at the patient drop-off area. Enter the building and check-in with registration on the main floor.  Please follow these instructions carefully (unless otherwise directed):  An IV will be required for this test and Nitroglycerin will be given.   On the Night Before the Test: Be sure to  Drink plenty of water . Do not consume any caffeinated/decaffeinated beverages or chocolate 12 hours prior to your test. Do not take any antihistamines 12 hours prior to your test.   On the Day of the Test: Drink plenty of water  until 1 hour prior to the test. Do not eat any food 1 hour prior to test. You may take your regular medications prior to the test.  Take metoprolol (Lopressor)100mg  two hours prior to test. If you take Furosemide/Hydrochlorothiazide/Spironolactone/Chlorthalidone, please HOLD on the morning of the test. FEMALES- please wear underwire-free bra if available, avoid dresses & tight clothing       After the Test: Drink plenty of water . After receiving IV contrast, you may experience a mild flushed feeling. This is normal. On occasion, you may experience a mild rash up to 24 hours after the test. This is not dangerous. If this occurs, you can take Benadryl  25 mg, Zyrtec, Claritin, or Allegra and increase your fluid intake. (Patients taking Tikosyn should avoid Benadryl , and may take Zyrtec, Claritin, or Allegra) If you experience trouble breathing, this can be serious. If it is severe call 911 IMMEDIATELY. If it is mild, please call our office.  We will call to schedule your test 2-4 weeks out understanding that some insurance companies will need an authorization prior to the service being performed.   For more information and frequently asked questions, please visit our website : [email or web address]  For non-scheduling related questions, please contact the cardiac imaging nurse navigator should you have any questions/concerns: Cardiac Imaging Nurse Navigators Direct Office Dial: [phone]  For scheduling needs, including cancellations and rescheduling, please call [place], [phone].

## 2024-06-07 NOTE — Assessment & Plan Note (Signed)
 History of hyperlipidemia on high-dose statin therapy with lipid profile performed 10/07/2023 revealing total cholesterol 171, LDL 71 and HDL of 84.

## 2024-06-20 ENCOUNTER — Institutional Professional Consult (permissible substitution): Admitting: Neurology

## 2024-06-29 ENCOUNTER — Encounter (HOSPITAL_COMMUNITY): Payer: Self-pay

## 2024-06-30 ENCOUNTER — Ambulatory Visit (HOSPITAL_COMMUNITY)
Admission: RE | Admit: 2024-06-30 | Discharge: 2024-06-30 | Disposition: A | Discharge status: Home / Self Care | Source: Ambulatory Visit | Attending: Cardiovascular Disease | Admitting: Cardiovascular Disease

## 2024-06-30 DIAGNOSIS — R072 Precordial pain: Secondary | ICD-10-CM | POA: Diagnosis present

## 2024-06-30 LAB — POCT I-STAT CREATININE: Creatinine, Ser: 0.9 mg/dL (ref 0.44–1.00)

## 2024-06-30 MED ORDER — NITROGLYCERIN 0.4 MG SL SUBL
0.8000 mg | SUBLINGUAL_TABLET | Freq: Once | SUBLINGUAL | Status: AC
  Administered 2024-06-30: 0.8 mg via SUBLINGUAL

## 2024-06-30 MED ORDER — IOHEXOL 350 MG/ML SOLN
95.0000 mL | Freq: Once | INTRAVENOUS | Status: AC | PRN
  Administered 2024-06-30: 95 mL via INTRAVENOUS

## 2024-07-03 ENCOUNTER — Ambulatory Visit: Payer: Self-pay | Admitting: Cardiovascular Disease

## 2024-07-04 ENCOUNTER — Other Ambulatory Visit (HOSPITAL_BASED_OUTPATIENT_CLINIC_OR_DEPARTMENT_OTHER): Payer: Self-pay

## 2024-07-07 ENCOUNTER — Ambulatory Visit: Admitting: Cardiology

## 2024-07-07 ENCOUNTER — Telehealth: Payer: Self-pay | Admitting: Cardiovascular Disease

## 2024-07-07 NOTE — Telephone Encounter (Signed)
 Pt spouse would like a hard copy of the heart scan please advise

## 2024-07-07 NOTE — Telephone Encounter (Signed)
 Results of coronary CTA printed and mailed earlier today.

## 2024-08-02 ENCOUNTER — Other Ambulatory Visit (HOSPITAL_BASED_OUTPATIENT_CLINIC_OR_DEPARTMENT_OTHER): Payer: Self-pay

## 2024-08-02 MED ORDER — HYDROMORPHONE HCL 2 MG PO TABS
2.0000 mg | ORAL_TABLET | Freq: Four times a day (QID) | ORAL | 0 refills | Status: AC | PRN
  Filled 2024-08-02: qty 120, 30d supply, fill #0

## 2024-08-02 MED ORDER — HYDROMORPHONE HCL 2 MG PO TABS: 2.0000 mg | ORAL_TABLET | Freq: Four times a day (QID) | ORAL | 0 refills | Status: AC | PRN

## 2024-09-04 ENCOUNTER — Other Ambulatory Visit: Payer: Self-pay

## 2024-09-04 ENCOUNTER — Other Ambulatory Visit (HOSPITAL_BASED_OUTPATIENT_CLINIC_OR_DEPARTMENT_OTHER): Payer: Self-pay

## 2024-09-06 ENCOUNTER — Other Ambulatory Visit (HOSPITAL_BASED_OUTPATIENT_CLINIC_OR_DEPARTMENT_OTHER): Payer: Self-pay

## 2024-09-12 ENCOUNTER — Encounter: Payer: Self-pay | Admitting: Neurology

## 2024-09-12 ENCOUNTER — Ambulatory Visit: Admitting: Neurology

## 2024-09-12 VITALS — BP 109/61 | HR 77 | Ht 63.0 in | Wt 137.0 lb

## 2024-09-12 DIAGNOSIS — R42 Dizziness and giddiness: Secondary | ICD-10-CM | POA: Diagnosis not present

## 2024-09-12 DIAGNOSIS — I679 Cerebrovascular disease, unspecified: Secondary | ICD-10-CM

## 2024-09-12 DIAGNOSIS — R2689 Other abnormalities of gait and mobility: Secondary | ICD-10-CM | POA: Diagnosis not present

## 2024-09-12 DIAGNOSIS — Z8673 Personal history of transient ischemic attack (TIA), and cerebral infarction without residual deficits: Secondary | ICD-10-CM | POA: Diagnosis not present

## 2024-09-12 DIAGNOSIS — R29898 Other symptoms and signs involving the musculoskeletal system: Secondary | ICD-10-CM

## 2024-09-12 DIAGNOSIS — F09 Unspecified mental disorder due to known physiological condition: Secondary | ICD-10-CM | POA: Insufficient documentation

## 2024-09-12 DIAGNOSIS — F324 Major depressive disorder, single episode, in partial remission: Secondary | ICD-10-CM | POA: Insufficient documentation

## 2024-09-12 NOTE — Progress Notes (Signed)
 " [facility] [address] [place]. [place] [address] [phone]       OFFICE CONSULT NOTE  Jillian Morris Date of Birth:  10/03/1945 Medical Record Number:  [id number]   Referring MD: Lamar Gander  Reason for Referral: Dizziness  HPI: Jillian Morris is a pleasant 79 year old Caucasian lady seen today for initial office consultation visit for dizziness.  History is obtained from the patient and her husband who is accompanying her as well as review of electronic medical records.  I reviewed pertinent available imaging films in PACS.  She has past medical history of bipolar disorder type II, degenerative brain disease, degenerative spine disease, chronic kidney disease, hypertension, thyroid  disease, traumatic subarachnoid hemorrhage.  Patient states she has had dizziness and gait imbalance for the last 2 to 3 years.  Feeling of imbalance comes on suddenly without warning.  She has good days and bad days.  This may last for hours to minutes..  And avoid falling down.  Of late she has become quite cautious and walks slowly holding onto her husband.  She does not use a cane or a walker.  She does have a history of degenerative spine disease back surgery and injection into the lumbar spine.  She also has a close and denies any radicular pain.  She was seen in the ER in February 2024 for episode of diplopia.  MRI scan of the brain on 10/20/2022 showed a 2 mm right corona radiata lacunar infarct.  There is also small left cerebellar infarct not seen on  prior scan from 04/01/2019.  There were also extensive changes of chronic small vessel disease.  He has been on aspirin  for stroke prevention and tolerating it well.  She has has been to ENT with a negative evaluation for cause of dizziness.  She denies any tingling numbness or burning in the feet.  She denies any tinnitus or decreased hearing  ROS:   14 system review of systems is positive for tinnitus, imbalance, leg weakness, back  pain and all other systems negative PMH:  Past Medical History:  Diagnosis Date   Abnormal gait    Achalasia 08/09/2013   Age-related osteoporosis without current pathological fracture 06/29/2017   Allergic rhinitis    Arthritis    Bipolar II disorder (HCC) 04/28/2018   Chronic kidney disease, stage 3b    Chronic otitis externa    Closed fracture of right distal radius    Closed fracture of zygomatic arch    DDD (degenerative disc disease), cervical 06/29/2017   DDD (degenerative disc disease), lumbar 06/29/2017   s/p laminectomy    Decreased estrogen level    Dysphagia    due to esophageal narrowing, able to take meds with thin liquid sif necessary    Essential hypertension    was briefly on losartan back early 2020 , had a severe reaction and was hospitalized , losartan was dc'd and she report shas not been prescribed any new bp meds as of yet, denies lasting issues from hospitalization    Essential tremor 06/29/2017   Fibromyalgia    Frequent falls    last fall was 03-09-2019 , my knee gave out. other times i feel like its vertigo . like a spacey feeling  . denies loc or loss of time , did not hit head    GAD (generalized anxiety disorder) 07/10/2018   Generalized abdominal pain    GERD (gastroesophageal reflux disease)    Graves disease    History of hiatal  hernia    History of TIA (transient ischemic attack) 06/29/2017   History of total knee replacement, right 06/29/2017   Hypothyroidism    IBS (irritable bowel syndrome)    resolved with use of oxymorhpne    Insomnia 07/10/2018   Iron deficiency anemia    s/p gastric bypass, malabsorption   Joint pain    Lymphedema    Major depressive disorder    Meralgia paraesthetica    Migraine    Mild cognitive impairment of uncertain or unknown etiology 08/03/2019   Pedal edema    Pharyngeal dysphagia    Pneumonia    Primary osteoarthritis of left knee 06/29/2017   Pure hypercholesterolemia    Restless legs    Right wrist  fracture 04/20/2016   Rosacea    S/P dilation of esophageal narrowing    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   Sjogren's disease 06/29/2017   Stricture and stenosis of esophagus 03/30/2012   Vitamin B12 deficiency     Social History:  Social History   Socioeconomic History   Marital status: Married    Spouse name: Not on file   Number of children: 0   Years of education: 18   Highest education level: Master's degree (e.g., MA, MS, MEng, MEd, MSW, MBA)  Occupational History   Occupation: Retired    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 [employer]  Tobacco Use   Smoking status: Never   Smokeless tobacco: Never  Vaping Use   Vaping status: Never Used  Substance and Sexual Activity   Alcohol  use: No    Alcohol /week: 0.0 standard drinks of alcohol    Drug use: No   Sexual activity: Not Currently  Other Topics Concern   Not on file  Social History Narrative   Right handed, Married, Step kids 2, Caffeine 4-5 cups daily, graduate school   Rare caffeine use    Social Drivers of Health   Tobacco Use: Low Risk (09/12/2024)   Patient History    Smoking Tobacco Use: Never    Smokeless Tobacco Use: Never    Passive Exposure: Not on file  Financial Resource Strain: Not on file  Food Insecurity: No Food Insecurity (01/22/2023)   Hunger Vital Sign    Worried About Running Out of Food in the Last Year: Never true    Ran Out of Food in the Last Year: Never true  Transportation Needs: No Transportation Needs (01/22/2023)   PRAPARE - Administrator, Civil Service (Medical): No    Lack of Transportation (Non-Medical): No  Physical Activity: Not on file  Stress: Not on file  Social Connections: Not on file  Intimate Partner Violence: Not At Risk (01/22/2023)   Humiliation, Afraid, Rape, and Kick questionnaire    Fear of Current or Ex-Partner: No    Emotionally Abused: No    Physically Abused: No    Sexually Abused: No  Depression (PHQ2-9): Not on  file  Alcohol  Screen: Not on file  Housing: Low Risk (01/22/2023)   Housing    Last Housing Risk Score: 0  Utilities: Not At Risk (01/22/2023)   AHC Utilities    Threatened with loss of utilities: No  Health Literacy: Not on file    Medications:  Medications Ordered Prior to Encounter[1]  Allergies:  Allergies[2]  Physical Exam General: Frail pleasant elderly Caucasian lady, seated, in no evident distress Head: head normocephalic and atraumatic.   Neck: supple with no carotid or supraclavicular bruits Cardiovascular: regular rate and rhythm, no  murmurs Musculoskeletal: no deformity Skin:  no rash/petichiae Vascular:  Normal pulses all extremities  Neurologic Exam Mental Status: Awake and fully alert. Oriented to place and time. Recent and remote memory intact. Attention span, concentration and fund of knowledge appropriate. Mood and affect appropriate.  Cranial Nerves: Fundoscopic exam reveals sharp disc margins. Pupils equal, briskly reactive to light. Extraocular movements full without nystagmus. Visual fields full to confrontation. Hearing intact. Facial sensation intact. Face, tongue, palate moves normally and symmetrically.  Motor: Normal bulk and tone. Normal strength in all tested extremity muscles except mild right ankle dorsiflexor weakness. Sensory.: intact to touch , pinprick , position and vibratory sensation.  Coordination: Rapid alternating movements normal in all extremities. Finger-to-nose and heel-to-shin performed accurately bilaterally. Gait and Station: Arises from chair with some difficulty. Stance is normal. Gait slightly unsteady.  Unsteady while standing on narrow-base and on the right foot unsupported.. Reflexes: 1+ and symmetric. Toes downgoing.   NIHSS  0 Modified Rankin  2   ASSESSMENT: 79 year old Caucasian lady with 2-3 year history of gait imbalance and dizziness which is likely multifactorial with a combination of lacunar strokes and small vessel  disease, degenerative arthritis and leg weakness.  History of right subcortical lacunar infarct in February 2024 from small vessel disease.  Vascular risk factors of hyperlipidemia, hypertension, lacunar disease and age     PLAN:I had a long discussion with the patient on the phone regarding her longstanding issues of dizziness and gait imbalance which are likely multifactorial due to combination of prior stroke and cerebral small vessel disease, degenerative spine disease with mild right leg weakness and answered questions.  I recommend she continue aspirin  for stroke prevention and maintain aggressive risk factor modification with strict control of hypertension with blood pressure goal below 130/90, lipid with LDL cholesterol goal below 70 mg percent and diabetes with hemoglobin A1c goal below 6.5%.  Recommend referral to physical therapy for gait and balance training.  Check MRI scan of cervical and lumbar spine for follow-up in the future in 6 to 8 months or call earlier if necessary   I personally spent a total of 50 minutes in the care of the patient today including getting/reviewing separately obtained history, performing a medically appropriate exam/evaluation, counseling and educating, placing orders, referring and communicating with other health care professionals, documenting clinical information in the EHR, independently interpreting results, and coordinating care.       Eather Popp, MD  Note: This document was prepared with digital dictation and possible smart phrase technology. Any transcriptional errors that result from this process are unintentional.      [1]  Current Outpatient Medications on File Prior to Visit  Medication Sig Dispense Refill   amLODipine  (NORVASC ) 5 MG tablet Take 5 mg by mouth daily.     aspirin  EC 81 MG tablet Take 1 tablet (81 mg total) by mouth daily. Swallow whole. 30 tablet 12   atorvastatin  (LIPITOR ) 80 MG tablet Take 1 tablet (80 mg total) by mouth  daily. 30 tablet 3   busPIRone (BUSPAR) 5 MG tablet Take 5 mg by mouth 2 (two) times daily.     carboxymethylcellulose (REFRESH PLUS) 0.5 % SOLN Place 1 drop into both eyes 3 (three) times daily as needed (dry eyes).     DULoxetine  (CYMBALTA ) 60 MG capsule TAKE 2 CAPSULES (120 MG TOTAL) BY MOUTH EVERY EVENING. (Patient taking differently: Take 60 mg by mouth 2 (two) times daily.) 180 capsule 0   ferrous sulfate  325 (65 FE) MG  tablet Take 325 mg by mouth at bedtime.     HYDROmorphone  (DILAUDID ) 2 MG tablet Take 1 tablet (2 mg total) by mouth 4 (four) times daily as needed for pain. 120 tablet 0   lamoTRIgine  (LAMICTAL ) 100 MG tablet Take 100 mg by mouth at bedtime.     metFORMIN (GLUCOPHAGE-XR) 500 MG 24 hr tablet Take 500 mg by mouth daily with breakfast.     methocarbamol  (ROBAXIN ) 500 MG tablet Take 1 tablet (500 mg total) by mouth 2 (two) times daily as needed. 180 tablet 1   metoprolol  tartrate (LOPRESSOR ) 100 MG tablet Take 1 tablet (100mg ) TWO hours prior to CT scan 1 tablet 0   omeprazole  (PRILOSEC) 40 MG capsule Take 40 mg by mouth in the morning and at bedtime.     Probiotic Product (PROBIOTIC DAILY PO) Take 1 capsule by mouth at bedtime.     rOPINIRole  (REQUIP ) 1 MG tablet Take 1 mg by mouth at bedtime as needed (restless leg syndrome).   6   SYNTHROID  100 MCG tablet Take 100 mcg by mouth daily before breakfast.     traZODone (DESYREL) 100 MG tablet Take 100 mg by mouth 2 (two) times daily.     diphenhydrAMINE  (BENADRYL ) 25 mg capsule Take 25 mg by mouth every 6 (six) hours as needed for allergies. (Patient not taking: Reported on 09/12/2024)     HYDROmorphone  (DILAUDID ) 2 MG tablet Take 1 tablet (2 mg total) by mouth 3 (three) times daily as needed for pain. (Patient not taking: Reported on 09/12/2024) 90 tablet 0   HYDROmorphone  (DILAUDID ) 2 MG tablet Take 1 tablet (2 mg total) by mouth 4 (four) times daily as needed for pain. (Patient not taking: Reported on 09/12/2024) 120 tablet 0    HYDROmorphone  (DILAUDID ) 2 MG tablet Take 1 tablet (2 mg total) by mouth 4 (four) times daily as needed for pain. (Patient not taking: Reported on 09/12/2024) 120 tablet 0   HYDROmorphone  (DILAUDID ) 2 MG tablet Take 1 tablet (2 mg total) by mouth 4 (four) times daily as needed for pain. (Patient not taking: Reported on 09/12/2024) 120 tablet 0   HYDROmorphone  (DILAUDID ) 2 MG tablet Take 1 tablet (2 mg total) by mouth 4 (four) times daily as needed. (Patient not taking: Reported on 09/12/2024) 60 tablet 0   HYDROmorphone  (DILAUDID ) 2 MG tablet Take 1 tablet (2 mg total) by mouth 4 (four) times daily as needed for pain. (Patient not taking: Reported on 09/12/2024) 120 tablet 0   HYDROmorphone  (DILAUDID ) 2 MG tablet Take 1 tablet (2 mg total) by mouth 4 (four) times daily as needed for pain. (Patient not taking: Reported on 09/12/2024) 120 tablet 0   HYDROmorphone  (DILAUDID ) 2 MG tablet Take 1 tablet (2 mg total) by mouth 4 (four) times daily as needed for pain. (Patient not taking: Reported on 09/12/2024) 120 tablet 0   HYDROmorphone  (DILAUDID ) 2 MG tablet Take 1 tablet (2 mg total) by mouth 4 (four) times daily as needed for pain. (Patient not taking: Reported on 09/12/2024) 120 tablet 0   HYDROmorphone  (DILAUDID ) 2 MG tablet Take 1 tablet (2 mg total) by mouth 4 (four) times daily as needed for pain. (Patient not taking: Reported on 09/12/2024) 60 tablet 0   HYDROmorphone  (DILAUDID ) 2 MG tablet Take 1 tablet (2 mg total) by mouth 4 (four) times daily as needed for pain, (Patient not taking: Reported on 09/12/2024) 120 tablet 0   HYDROmorphone  (DILAUDID ) 2 MG tablet Take 1 tablet (2 mg total)  by mouth 4 (four) times daily as needed for pain. (Patient not taking: Reported on 09/12/2024) 120 tablet 0   HYDROmorphone  (DILAUDID ) 2 MG tablet Take 1 tablet (2 mg total) by mouth 4 (four) times daily as needed for pain. (Patient not taking: Reported on 09/12/2024) 120 tablet 0   hydrOXYzine  (ATARAX ) 10 MG tablet Take 10 mg  by mouth at bedtime as needed (insomnia). (Patient not taking: Reported on 09/12/2024)     LORazepam  (ATIVAN ) 0.5 MG tablet Take 0.5 mg by mouth at bedtime. (Patient not taking: Reported on 09/12/2024)     methocarbamol  (ROBAXIN ) 500 MG tablet Take 500 mg by mouth 2 (two) times daily as needed for muscle spasms. (Patient not taking: Reported on 09/12/2024)     No current facility-administered medications on file prior to visit.  [2]  Allergies Allergen Reactions   Cefuroxime Axetil Rash    Severe rash   Losartan Rash   Actonel [Risedronate Sodium]     GI Upset   Ambien [Zolpidem Tartrate] Other (See Comments)    Sleep-walking, hallucinations   Depakote [Divalproex Sodium]     Hallucinations   Inderal [Propranolol] Other (See Comments)    Hallucinations    Nsaids Other (See Comments)    Pt. Had gastric bypass surgery    Penicillins Other (See Comments)    Stomach upset     Silicon Other (See Comments)    Thyroid  storm   Valproic Acid Other (See Comments)   Zolpidem Other (See Comments)    zolpidem   Adhesive [Tape] Itching and Rash    Please use paper tape   Latex Swelling and Rash    Redness lips   Nickel Itching and Rash    Contact Dermatitis (intolerance)   "

## 2024-09-12 NOTE — Patient Instructions (Addendum)
 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  I recommend she continue aspirin  for stroke prevention and maintain aggressive risk factor modification with strict control of hypertension with blood pressure goal below 130/90, lipid with LDL cholesterol goal below 70 mg percent and diabetes with hemoglobin A1c goal below 6.5%.  Recommend referral to physical therapy for gait and balance training.  Check MRI scan of cervical and lumbar spine for follow-up in the future in 6 to 8 months or call earlier if necessary  Fall Prevention in the Home, Adult Falls can cause injuries and affect people of all ages. There are many simple things that you can do to make your home safe and to help prevent falls. If you need it, ask for help making these changes. What actions can I take to prevent falls? General information Use good lighting in all rooms. Make sure to: Replace any light bulbs that burn out. Turn on lights if it is dark and use night-lights. Keep items that you use often in easy-to-reach places. Lower the shelves around your home if needed. Move furniture so that there are clear paths around it. Do not keep throw rugs or other things on the floor that can make you trip. If any of your floors are uneven, fix them. Add color or contrast paint or tape to clearly mark and help you see: Grab bars or handrails. First and last steps of staircases. Where the edge of each step is. If you use a ladder or stepladder: Make sure that it is fully opened. Do not climb a closed ladder. Make sure the sides of the ladder are locked in place. Have someone hold the ladder while you use it. Know where your pets are as you move through your home. What can I do in  the bathroom?     Keep the floor dry. Clean up any water  that is on the floor right away. Remove soap buildup in the bathtub or shower. Buildup makes bathtubs and showers slippery. Use non-skid mats or decals on the floor of the bathtub or shower. Attach bath mats securely with double-sided, non-slip rug tape. If you need to sit down while you are in the shower, use a non-slip stool. Install grab bars by the toilet and in the bathtub and shower. Do not use towel bars as grab bars. What can I do in the bedroom? Make sure that you have a light by your bed that is easy to reach. Do not use any sheets or blankets on your bed that hang to the floor. Have a firm bench or chair with side arms that you can use for support when you get dressed. What can I do in the kitchen? Clean up any spills right away. If you need to reach something above you, use a sturdy step stool that has a grab bar. Keep electrical cables out of the way. Do not use floor polish or wax that makes floors slippery. What can I do with my stairs? Do not leave anything on the stairs. Make sure that you have a light switch at the top and the bottom of the stairs. Have them installed if you do not have them. Make sure that there are handrails on both sides of the stairs. Fix handrails that are  broken or loose. Make sure that handrails are as long as the staircases. Install non-slip stair treads on all stairs in your home if they do not have carpet. Avoid having throw rugs at the top or bottom of stairs, or secure the rugs with carpet tape to prevent them from moving. Choose a carpet design that does not hide the edge of steps on the stairs. Make sure that carpet is firmly attached to the stairs. Fix any carpet that is loose or worn. What can I do on the outside of my home? Use bright outdoor lighting. Repair the edges of walkways and driveways and fix any cracks. Clear paths of anything that can make you trip, such as tools or  rocks. Add color or contrast paint or tape to clearly mark and help you see high doorway thresholds. Trim any bushes or trees on the main path into your home. Check that handrails are securely fastened and in good repair. Both sides of all steps should have handrails. Install guardrails along the edges of any raised decks or porches. Have leaves, snow, and ice cleared regularly. Use sand, salt, or ice melt on walkways during winter months if you live where there is ice and snow. In the garage, clean up any spills right away, including grease or oil spills. What other actions can I take? Review your medicines with your health care provider. Some medicines can make you confused or feel dizzy. This can increase your chance of falling. Wear closed-toe shoes that fit well and support your feet. Wear shoes that have rubber soles and low heels. Use a cane, walker, scooter, or crutches that help you move around if needed. Talk with your provider about other ways that you can decrease your risk of falls. This may include seeing a physical therapist to learn to do exercises to improve movement and strength. Where to find more information Centers for Disease Control and Prevention, [employer]: tonerpromos.no [employer] on Aging: baseringtones.pl [employer] on Aging: baseringtones.pl Contact a health care provider if: You are afraid of falling at home. You feel weak, drowsy, or dizzy at home. You fall at home. Get help right away if you: Lose consciousness or have trouble moving after a fall. Have a fall that causes a head injury. These symptoms may be an emergency. Get help right away. Call 911. Do not wait to see if the symptoms will go away. Do not drive yourself to the hospital. This information is not intended to replace advice given to you by your health care provider. Make sure you discuss any questions you have with your health care provider. Document Revised: 04/13/2022 Document Reviewed:  04/13/2022 Elsevier Patient Education  2024 [employer].

## 2024-09-20 ENCOUNTER — Telehealth: Payer: Self-pay | Admitting: Neurology

## 2024-09-20 NOTE — Telephone Encounter (Signed)
 Cornelio barrows: [id number] exp. 09/20/24-11/18/24 sent to [facility] [phone]

## 2024-10-06 ENCOUNTER — Other Ambulatory Visit

## 2025-04-12 ENCOUNTER — Ambulatory Visit: Admitting: Neurology
